# Patient Record
Sex: Female | Born: 1955
Health system: Southern US, Community
[De-identification: ages and names within clinical notes are randomized; demographics above are authoritative.]

## PROBLEM LIST (undated history)

## (undated) DIAGNOSIS — J189 Pneumonia, unspecified organism: Secondary | ICD-10-CM

## (undated) DIAGNOSIS — J449 Chronic obstructive pulmonary disease, unspecified: Secondary | ICD-10-CM

## (undated) DIAGNOSIS — K8020 Calculus of gallbladder without cholecystitis without obstruction: Secondary | ICD-10-CM

## (undated) DIAGNOSIS — K635 Polyp of colon: Secondary | ICD-10-CM

## (undated) DIAGNOSIS — F419 Anxiety disorder, unspecified: Secondary | ICD-10-CM

## (undated) DIAGNOSIS — I7090 Unspecified atherosclerosis: Secondary | ICD-10-CM

## (undated) DIAGNOSIS — N321 Vesicointestinal fistula: Secondary | ICD-10-CM

## (undated) DIAGNOSIS — K5790 Diverticulosis of intestine, part unspecified, without perforation or abscess without bleeding: Secondary | ICD-10-CM

## (undated) DIAGNOSIS — Z8489 Family history of other specified conditions: Secondary | ICD-10-CM

## (undated) HISTORY — DX: Polyp of colon: K63.5

## (undated) HISTORY — DX: Unspecified atherosclerosis: I70.90

## (undated) HISTORY — DX: Diverticulosis of intestine, part unspecified, without perforation or abscess without bleeding: K57.90

## (undated) HISTORY — DX: Calculus of gallbladder without cholecystitis without obstruction: K80.20

## (undated) HISTORY — PX: CHOLECYSTECTOMY: SHX55

## (undated) HISTORY — DX: Anxiety disorder, unspecified: F41.9

## (undated) HISTORY — DX: Chronic obstructive pulmonary disease, unspecified: J44.9

## (undated) HISTORY — PX: COLONOSCOPY: SHX174

## (undated) HISTORY — PX: ABDOMINAL HYSTERECTOMY: SHX81

---

## 1998-03-02 ENCOUNTER — Other Ambulatory Visit: Admission: RE | Admit: 1998-03-02 | Discharge: 1998-03-02 | Payer: Self-pay | Admitting: Dermatology

## 1999-01-28 ENCOUNTER — Other Ambulatory Visit: Admission: RE | Admit: 1999-01-28 | Discharge: 1999-01-28 | Payer: Self-pay | Admitting: Obstetrics and Gynecology

## 1999-02-04 ENCOUNTER — Ambulatory Visit (HOSPITAL_COMMUNITY): Admission: RE | Admit: 1999-02-04 | Discharge: 1999-02-04 | Payer: Self-pay | Admitting: Obstetrics and Gynecology

## 1999-02-04 ENCOUNTER — Encounter: Payer: Self-pay | Admitting: Obstetrics and Gynecology

## 2000-07-31 ENCOUNTER — Emergency Department (HOSPITAL_COMMUNITY): Admission: EM | Admit: 2000-07-31 | Discharge: 2000-07-31 | Payer: Self-pay | Admitting: Emergency Medicine

## 2000-07-31 ENCOUNTER — Encounter: Payer: Self-pay | Admitting: Emergency Medicine

## 2002-11-12 ENCOUNTER — Emergency Department (HOSPITAL_COMMUNITY): Admission: EM | Admit: 2002-11-12 | Discharge: 2002-11-12 | Payer: Self-pay | Admitting: *Deleted

## 2002-11-12 ENCOUNTER — Encounter: Payer: Self-pay | Admitting: *Deleted

## 2006-09-27 ENCOUNTER — Emergency Department (HOSPITAL_COMMUNITY): Admission: EM | Admit: 2006-09-27 | Discharge: 2006-09-27 | Payer: Self-pay | Admitting: Emergency Medicine

## 2007-04-19 ENCOUNTER — Emergency Department (HOSPITAL_COMMUNITY): Admission: EM | Admit: 2007-04-19 | Discharge: 2007-04-19 | Payer: Self-pay | Admitting: Emergency Medicine

## 2008-06-23 ENCOUNTER — Emergency Department (HOSPITAL_COMMUNITY): Admission: EM | Admit: 2008-06-23 | Discharge: 2008-06-23 | Payer: Self-pay | Admitting: Emergency Medicine

## 2011-10-23 ENCOUNTER — Emergency Department (HOSPITAL_COMMUNITY): Payer: Managed Care, Other (non HMO)

## 2011-10-23 ENCOUNTER — Emergency Department (HOSPITAL_COMMUNITY)
Admission: EM | Admit: 2011-10-23 | Discharge: 2011-10-23 | Disposition: A | Discharge status: Home / Self Care | Payer: Managed Care, Other (non HMO) | Attending: Emergency Medicine | Admitting: Emergency Medicine

## 2011-10-23 ENCOUNTER — Other Ambulatory Visit: Payer: Self-pay

## 2011-10-23 DIAGNOSIS — E876 Hypokalemia: Secondary | ICD-10-CM | POA: Insufficient documentation

## 2011-10-23 DIAGNOSIS — R110 Nausea: Secondary | ICD-10-CM | POA: Insufficient documentation

## 2011-10-23 DIAGNOSIS — R05 Cough: Secondary | ICD-10-CM | POA: Insufficient documentation

## 2011-10-23 DIAGNOSIS — J3489 Other specified disorders of nose and nasal sinuses: Secondary | ICD-10-CM | POA: Insufficient documentation

## 2011-10-23 DIAGNOSIS — R079 Chest pain, unspecified: Secondary | ICD-10-CM | POA: Insufficient documentation

## 2011-10-23 DIAGNOSIS — R209 Unspecified disturbances of skin sensation: Secondary | ICD-10-CM | POA: Insufficient documentation

## 2011-10-23 DIAGNOSIS — IMO0001 Reserved for inherently not codable concepts without codable children: Secondary | ICD-10-CM | POA: Insufficient documentation

## 2011-10-23 DIAGNOSIS — J40 Bronchitis, not specified as acute or chronic: Secondary | ICD-10-CM | POA: Insufficient documentation

## 2011-10-23 DIAGNOSIS — M79609 Pain in unspecified limb: Secondary | ICD-10-CM | POA: Insufficient documentation

## 2011-10-23 DIAGNOSIS — R059 Cough, unspecified: Secondary | ICD-10-CM | POA: Insufficient documentation

## 2011-10-23 LAB — URINALYSIS, ROUTINE W REFLEX MICROSCOPIC
Bilirubin Urine: NEGATIVE
Glucose, UA: NEGATIVE mg/dL
Nitrite: NEGATIVE
Specific Gravity, Urine: 1.008 (ref 1.005–1.030)
pH: 6 (ref 5.0–8.0)

## 2011-10-23 LAB — CBC
HCT: 41 % (ref 36.0–46.0)
Hemoglobin: 14.4 g/dL (ref 12.0–15.0)
MCH: 30.8 pg (ref 26.0–34.0)
MCV: 87.6 fL (ref 78.0–100.0)
RBC: 4.68 MIL/uL (ref 3.87–5.11)
WBC: 7 10*3/uL (ref 4.0–10.5)

## 2011-10-23 LAB — POCT I-STAT TROPONIN I: Troponin i, poc: 0 ng/mL (ref 0.00–0.08)

## 2011-10-23 LAB — BASIC METABOLIC PANEL
CO2: 28 mEq/L (ref 19–32)
Chloride: 97 mEq/L (ref 96–112)
Glucose, Bld: 109 mg/dL — ABNORMAL HIGH (ref 70–99)
Sodium: 136 mEq/L (ref 135–145)

## 2011-10-23 LAB — CARDIAC PANEL(CRET KIN+CKTOT+MB+TROPI)
Relative Index: INVALID (ref 0.0–2.5)
Total CK: 80 U/L (ref 7–177)

## 2011-10-23 MED ORDER — POTASSIUM CHLORIDE CRYS ER 20 MEQ PO TBCR
40.0000 meq | EXTENDED_RELEASE_TABLET | Freq: Once | ORAL | Status: AC
  Administered 2011-10-23: 40 meq via ORAL
  Filled 2011-10-23: qty 2

## 2011-10-23 MED ORDER — ALBUTEROL SULFATE HFA 108 (90 BASE) MCG/ACT IN AERS: 1.0000 | INHALATION_SPRAY | Freq: Four times a day (QID) | RESPIRATORY_TRACT | Status: DC | PRN

## 2011-10-23 MED ORDER — ASPIRIN 81 MG PO CHEW
324.0000 mg | CHEWABLE_TABLET | Freq: Once | ORAL | Status: AC
  Administered 2011-10-23: 324 mg via ORAL
  Filled 2011-10-23: qty 4

## 2011-10-23 MED ORDER — KETOROLAC TROMETHAMINE 30 MG/ML IJ SOLN
30.0000 mg | Freq: Once | INTRAMUSCULAR | Status: AC
  Administered 2011-10-23: 30 mg via INTRAVENOUS
  Filled 2011-10-23: qty 1

## 2011-10-23 NOTE — ED Notes (Signed)
Pt alert and oriented x4. Respirations even and unlabored, bilateral symmetrical rise and fall of chest. Skin warm and dry. In no acute distress. Denies needs.   

## 2011-10-23 NOTE — ED Notes (Signed)
Pt in from home with chest discomfort x1 week states pain is left side radiating to the left axilla pt states pain is accompanied with nausea and sob pt is a current smoker states sob worsens when active

## 2011-10-23 NOTE — ED Provider Notes (Addendum)
History     CSN: [id number]  Arrival date & time 10/23/11  1034   First MD Initiated Contact with Patient 10/23/11 1055      Chief Complaint  Patient presents with  . Chest Pain   Pleasant patient presents from home. She states she began having "flu symptoms" earlier this week. This included cough, congestion, and some body aches. She was seen at the urgent care on Thursday. States she had a chest x-ray and influenza test, and a urinalysis done. She was told she did not have the flu, but was placed on doxycycline. Apparently for bronchitis and UTI.Marland Kitchen Also, having some intermittent nausea. Patient apparently is a smoker.  She did have a fever at that time, but no fever today. She continues to have left-sided chest tightness and vague discomfort, and states today, had painand tingling in both hands. She also notes some discomfort with exertion  and with palpation of the chest. The patient is notably upset, tearful. She states her brother recently died of a heart attack that was due to stroke, and staph infection. She has had no previous stress testing done. Denies any leg pain or back pain.  She denies any pleuritic pain. Denies any recent surgeries. No significant past medical history. No diabetes or hypertension.  Denies dizziness or syncope.  No hemoptysis.  (Consider location/radiation/quality/duration/timing/severity/associated sxs/prior treatment) HPI  History reviewed. No pertinent past medical history.  History reviewed. No pertinent past surgical history.  History reviewed. No pertinent family history.  History  Substance Use Topics  . Smoking status: Current Everyday Smoker  . Smokeless tobacco: Not on file  . Alcohol Use: No    OB History    Grav Para Term Preterm Abortions TAB SAB Ect Mult Living                  Review of Systems  Constitutional: Negative for chills and fatigue.  HENT: Negative for neck stiffness and tinnitus.   Eyes: Negative for pain.  Respiratory:  Positive for cough. Negative for apnea, choking, wheezing and stridor.   Cardiovascular: Negative for palpitations and leg swelling.  Genitourinary: Negative for flank pain.  Musculoskeletal: Negative for back pain.  Neurological: Negative for dizziness and syncope.  Psychiatric/Behavioral: Negative for confusion and agitation.  All other systems reviewed and are negative.    Allergies  Ampicillin; Codeine; Penicillins; and Zithromax  Home Medications   Current Outpatient Rx  Name Route Sig Dispense Refill  . BENZONATATE 100 MG PO CAPS Oral Take 100 mg by mouth 3 (three) times daily as needed. For cough.     . DOXYCYCLINE HYCLATE 100 MG PO CAPS Oral Take 100 mg by mouth 2 (two) times daily. 10 day course of therapy; not completed.       BP 130/82  Pulse 84  Temp(Src) 98.4 F (36.9 C) (Oral)  Resp 18  SpO2 93%  Physical Exam  Nursing note and vitals reviewed. Constitutional: She is oriented to person, place, and time. She appears well-developed and well-nourished.       Calm, cooperative, comfortable.   HENT:  Head: Normocephalic and atraumatic.  Eyes: Conjunctivae and EOM are normal. Pupils are equal, round, and reactive to light.  Neck: Neck supple.  Cardiovascular: Normal rate and regular rhythm.  Exam reveals no gallop and no friction rub.   No murmur heard. Pulmonary/Chest: Breath sounds normal. She has no wheezes. She has no rales. She exhibits no tenderness.       Very tender to palpation  to L upper chest wall.   Abdominal: Soft. Bowel sounds are normal. She exhibits no distension. There is no tenderness. There is no rebound and no guarding.  Musculoskeletal: Normal range of motion.  Neurological: She is alert and oriented to person, place, and time. No cranial nerve deficit. Coordination normal.  Skin: Skin is warm and dry. No rash noted.  Psychiatric: She has a normal mood and affect.    ED Course  Procedures (including critical care time)  Labs Reviewed    BASIC METABOLIC PANEL - Abnormal; Notable for the following:    Potassium 3.0 (*)    Glucose, Bld 109 (*)    All other components within normal limits  URINALYSIS, ROUTINE W REFLEX MICROSCOPIC - Abnormal; Notable for the following:    Hgb urine dipstick TRACE (*)    Ketones, ur TRACE (*)    All other components within normal limits  CARDIAC PANEL(CRET KIN+CKTOT+MB+TROPI)  CBC  URINE MICROSCOPIC-ADD ON  POCT I-STAT TROPONIN I  I-STAT TROPONIN I  URINE CULTURE  I-STAT TROPONIN I   Dg Chest 2 View  10/23/2011  *RADIOLOGY REPORT*  Clinical Data: Smoker with chest pain.  CHEST - 2 VIEW  Comparison: 06/23/2008.  Findings: Normal sized heart.  Clear lungs.  The lungs remain mildly hyperexpanded.  Stable mild central peribronchial thickening.  Mild thoracic spine degenerative changes.  IMPRESSION: Stable mild changes of COPD and chronic bronchitis.  No acute abnormality.  Original Report Authenticated By: Darrol Angel, M.D.     1. Chest pain   2. Bronchitis       MDM  Pt is seen and examined;  Initial history and physical completed.  Will follow.  nt     Date: 10/23/2011  Rate: 93  Rhythm: normal sinus rhythm  QRS Axis: normal  Intervals: normal  ST/T Wave abnormalities: nonspecific T wave changes  Conduction Disutrbances:none  Narrative Interpretation:   Old EKG Reviewed: changes noted    Results for orders placed during the hospital encounter of 10/23/11  CARDIAC PANEL(CRET KIN+CKTOT+MB+TROPI)      Component Value Range   Total CK 80  7 - 177 (U/L)   CK, MB 2.0  0.3 - 4.0 (ng/mL)   Troponin I <0.30  <0.30 (ng/mL)   Relative Index RELATIVE INDEX IS INVALID  0.0 - 2.5   BASIC METABOLIC PANEL      Component Value Range   Sodium 136  135 - 145 (mEq/L)   Potassium 3.0 (*) 3.5 - 5.1 (mEq/L)   Chloride 97  96 - 112 (mEq/L)   CO2 28  19 - 32 (mEq/L)   Glucose, Bld 109 (*) 70 - 99 (mg/dL)   BUN 13  6 - 23 (mg/dL)   Creatinine, Ser 1.61  0.50 - 1.10 (mg/dL)   Calcium  9.3  8.4 - 10.5 (mg/dL)   GFR calc non Af Amer >90  >90 (mL/min)   GFR calc Af Amer >90  >90 (mL/min)  CBC      Component Value Range   WBC 7.0  4.0 - 10.5 (K/uL)   RBC 4.68  3.87 - 5.11 (MIL/uL)   Hemoglobin 14.4  12.0 - 15.0 (g/dL)   HCT 09.6  04.5 - 40.9 (%)   MCV 87.6  78.0 - 100.0 (fL)   MCH 30.8  26.0 - 34.0 (pg)   MCHC 35.1  30.0 - 36.0 (g/dL)   RDW 81.1  91.4 - 78.2 (%)   Platelets 209  150 - 400 (K/uL)  URINALYSIS,  ROUTINE W REFLEX MICROSCOPIC      Component Value Range   Color, Urine YELLOW  YELLOW    APPearance CLEAR  CLEAR    Specific Gravity, Urine 1.008  1.005 - 1.030    pH 6.0  5.0 - 8.0    Glucose, UA NEGATIVE  NEGATIVE (mg/dL)   Hgb urine dipstick TRACE (*) NEGATIVE    Bilirubin Urine NEGATIVE  NEGATIVE    Ketones, ur TRACE (*) NEGATIVE (mg/dL)   Protein, ur NEGATIVE  NEGATIVE (mg/dL)   Urobilinogen, UA 0.2  0.0 - 1.0 (mg/dL)   Nitrite NEGATIVE  NEGATIVE    Leukocytes, UA NEGATIVE  NEGATIVE   URINE MICROSCOPIC-ADD ON      Component Value Range   Squamous Epithelial / LPF RARE  RARE    RBC / HPF 0-2  <3 (RBC/hpf)   Bacteria, UA RARE  RARE   POCT I-STAT TROPONIN I      Component Value Range   Troponin i, poc 0.00  0.00 - 0.08 (ng/mL)   Comment 3            Dg Chest 2 View  10/23/2011  *RADIOLOGY REPORT*  Clinical Data: Smoker with chest pain.  CHEST - 2 VIEW  Comparison: 06/23/2008.  Findings: Normal sized heart.  Clear lungs.  The lungs remain mildly hyperexpanded.  Stable mild central peribronchial thickening.  Mild thoracic spine degenerative changes.  IMPRESSION: Stable mild changes of COPD and chronic bronchitis.  No acute abnormality.  Original Report Authenticated By: Darrol Angel, M.D.      3:00 PM  Patient is reassessed in the room. Initial CK, CK-MB, and troponin, were all negative. White count is normal. Urine was negative for nitrites or leukocytes. Initial studies are reassuring other than mild low potassium, which patient was given.  At  this time. The pain sounds very atypical, is reproducible and has been there for several days. Patient is calm, comfortable, and in no acute distress.  NSR on monitor, HR in 80's  Will repeat troponin, give Toradol for her chest wall pain and reassessed, likely will be stable for outpatient followup   3:01 PM Patient reassessed. She continues to be pain-free and remains well.  Second troponin is 0.00. Very low suspicion for cardiac etiology. Again, reproducible, associated with cough. Patient was given option for inpatient observation or outpatient followup and she would like to followup as an outpatient. Will have her follow up with her primary care physician and cardiologist as needed. Patient was told to return to ED for any concerns. Continue her doxycycline and was also given a prescription for albuterol inhaler  Axiel Fjeld A. Patrica Duel, MD 10/23/11 1520   Dc home in stable and improved condition. 3:22 PM   TRUE Shackleford A. Patrica Duel, MD 10/23/11 1610

## 2011-10-24 LAB — URINE CULTURE
Colony Count: NO GROWTH
Culture  Setup Time: 201301061745
Culture: NO GROWTH

## 2011-11-15 ENCOUNTER — Ambulatory Visit (INDEPENDENT_AMBULATORY_CARE_PROVIDER_SITE_OTHER): Payer: Managed Care, Other (non HMO) | Admitting: Internal Medicine

## 2011-11-15 ENCOUNTER — Other Ambulatory Visit (INDEPENDENT_AMBULATORY_CARE_PROVIDER_SITE_OTHER): Payer: Managed Care, Other (non HMO)

## 2011-11-15 ENCOUNTER — Encounter: Payer: Self-pay | Admitting: Internal Medicine

## 2011-11-15 DIAGNOSIS — J439 Emphysema, unspecified: Secondary | ICD-10-CM | POA: Insufficient documentation

## 2011-11-15 DIAGNOSIS — E876 Hypokalemia: Secondary | ICD-10-CM

## 2011-11-15 DIAGNOSIS — Z720 Tobacco use: Secondary | ICD-10-CM | POA: Insufficient documentation

## 2011-11-15 DIAGNOSIS — J449 Chronic obstructive pulmonary disease, unspecified: Secondary | ICD-10-CM

## 2011-11-15 DIAGNOSIS — J4489 Other specified chronic obstructive pulmonary disease: Secondary | ICD-10-CM | POA: Insufficient documentation

## 2011-11-15 DIAGNOSIS — F17200 Nicotine dependence, unspecified, uncomplicated: Secondary | ICD-10-CM

## 2011-11-15 DIAGNOSIS — R002 Palpitations: Secondary | ICD-10-CM

## 2011-11-15 DIAGNOSIS — R7309 Other abnormal glucose: Secondary | ICD-10-CM

## 2011-11-15 DIAGNOSIS — F411 Generalized anxiety disorder: Secondary | ICD-10-CM

## 2011-11-15 DIAGNOSIS — J441 Chronic obstructive pulmonary disease with (acute) exacerbation: Secondary | ICD-10-CM | POA: Insufficient documentation

## 2011-11-15 DIAGNOSIS — R079 Chest pain, unspecified: Secondary | ICD-10-CM

## 2011-11-15 DIAGNOSIS — R739 Hyperglycemia, unspecified: Secondary | ICD-10-CM | POA: Insufficient documentation

## 2011-11-15 DIAGNOSIS — F419 Anxiety disorder, unspecified: Secondary | ICD-10-CM

## 2011-11-15 LAB — COMPREHENSIVE METABOLIC PANEL
ALT: 24 U/L (ref 0–35)
Alkaline Phosphatase: 68 U/L (ref 39–117)
Sodium: 140 mEq/L (ref 135–145)
Total Bilirubin: 0.5 mg/dL (ref 0.3–1.2)
Total Protein: 6.8 g/dL (ref 6.0–8.3)

## 2011-11-15 LAB — CBC WITH DIFFERENTIAL/PLATELET
Eosinophils Absolute: 0.1 10*3/uL (ref 0.0–0.7)
Eosinophils Relative: 1.6 % (ref 0.0–5.0)
Lymphocytes Relative: 43.9 % (ref 12.0–46.0)
MCV: 93.3 fl (ref 78.0–100.0)
Monocytes Absolute: 0.4 10*3/uL (ref 0.1–1.0)
Neutrophils Relative %: 48.5 % (ref 43.0–77.0)
Platelets: 296 10*3/uL (ref 150.0–400.0)
WBC: 8.3 10*3/uL (ref 4.5–10.5)

## 2011-11-15 LAB — URINALYSIS, ROUTINE W REFLEX MICROSCOPIC
Hgb urine dipstick: NEGATIVE
Nitrite: NEGATIVE
Total Protein, Urine: NEGATIVE
pH: 6 (ref 5.0–8.0)

## 2011-11-15 LAB — LIPID PANEL
HDL: 61.2 mg/dL (ref >39.00)
Triglycerides: 105 mg/dL (ref 0.0–149.0)
VLDL: 21 mg/dL (ref 0.0–40.0)

## 2011-11-15 LAB — HEMOGLOBIN A1C: Hgb A1c MFr Bld: 5.9 % (ref 4.6–6.5)

## 2011-11-15 MED ORDER — ALPRAZOLAM 0.25 MG PO TABS: 0.2500 mg | ORAL_TABLET | Freq: Three times a day (TID) | ORAL | Status: DC | PRN

## 2011-11-15 MED ORDER — ACLIDINIUM BROMIDE 400 MCG/ACT IN AEPB: 1.0000 | INHALATION_SPRAY | Freq: Two times a day (BID) | RESPIRATORY_TRACT | Status: DC

## 2011-11-15 NOTE — Assessment & Plan Note (Signed)
The palpitations sound benign, she will see cardiology for further evaluation

## 2011-11-15 NOTE — Patient Instructions (Signed)
Chest Pain (Nonspecific) It is often hard to give a specific diagnosis for the cause of chest pain. There is always a chance that your pain could be related to something serious, such as a heart attack or a blood clot in the lungs. You need to follow up with your caregiver for further evaluation. CAUSES   Heartburn.   Pneumonia or bronchitis.   Anxiety and stress.   Inflammation around your heart (pericarditis) or lung (pleuritis or pleurisy).   A blood clot in the lung.   A collapsed lung (pneumothorax). It can develop suddenly on its own (spontaneous pneumothorax) or from injury (trauma) to the chest.  The chest wall is composed of bones, muscles, and cartilage. Any of these can be the source of the pain.  The bones can be bruised by injury.   The muscles or cartilage can be strained by coughing or overwork.   The cartilage can be affected by inflammation and become sore (costochondritis).  DIAGNOSIS  Lab tests or other studies, such as X-rays, an EKG, stress testing, or cardiac imaging, may be needed to find the cause of your pain.  TREATMENT   Treatment depends on what may be causing your chest pain. Treatment may include:   Acid blockers for heartburn.   Anti-inflammatory medicine.   Pain medicine for inflammatory conditions.   Antibiotics if an infection is present.   You may be advised to change lifestyle habits. This includes stopping smoking and avoiding caffeine and chocolate.   You may be advised to keep your head raised (elevated) when sleeping. This reduces the chance of acid going backward from your stomach into your esophagus.   Most of the time, nonspecific chest pain will improve within 2 to 3 days with rest and mild pain medicine.  HOME CARE INSTRUCTIONS   If antibiotics were prescribed, take the full amount even if you start to feel better.   For the next few days, avoid physical activities that bring on chest pain. Continue physical activities as  directed.   Do not smoke cigarettes or drink alcohol until your symptoms are gone.   Only take over-the-counter or prescription medicine for pain, discomfort, or fever as directed by your caregiver.   Follow your caregiver's suggestions for further testing if your chest pain does not go away.   Keep any follow-up appointments you made. If you do not go to an appointment, you could develop lasting (chronic) problems with pain. If there is any problem keeping an appointment, you must call to reschedule.  SEEK MEDICAL CARE IF:   You think you are having problems from the medicine you are taking. Read your medicine instructions carefully.   Your chest pain does not go away, even after treatment.   You develop a rash with blisters on your chest.  SEEK IMMEDIATE MEDICAL CARE IF:   You have increased chest pain or pain that spreads to your arm, neck, jaw, back, or belly (abdomen).   You develop shortness of breath, an increasing cough, or you are coughing up blood.   You have severe back or abdominal pain, feel sick to your stomach (nauseous) or throw up (vomit).   You develop severe weakness, fainting, or chills.   You have an oral temperature above 102 F (38.9 C), not controlled by medicine.  THIS IS AN EMERGENCY. Do not wait to see if the pain will go away. Get medical help at once. Call your local emergency services (911 in [place] Do not drive yourself to   the hospital. MAKE SURE YOU:   Understand these instructions.   Will watch your condition.   Will get help right away if you are not doing well or get worse.  Document Released: 07/13/2005 Document Revised: 06/15/2011 Document Reviewed: 05/08/2008 [facility] Patient Information 2012 [place], [place].Chronic Obstructive Pulmonary Disease Chronic obstructive pulmonary disease (COPD) is a condition in which airflow from the lungs is restricted. The lungs can never return to normal, but there are measures you can take which will improve  them and make you feel better. CAUSES   Smoking.   Exposure to secondhand smoke.   Breathing in irritants (pollution, cigarette smoke, strong smells, aerosol sprays, paint fumes).   History of lung infections.  TREATMENT  Treatment focuses on making you comfortable (supportive care). Your caregiver may prescribe medications (inhaled or pills) to help improve your breathing. HOME CARE INSTRUCTIONS   If you smoke, stop smoking.   Avoid exposure to smoke, chemicals, and fumes that aggravate your breathing.   Take antibiotic medicines as directed by your caregiver.   Avoid medicines that dry up your system and slow down the elimination of secretions (antihistamines and cough syrups). This decreases respiratory capacity and may lead to infections.   Drink enough water and fluids to keep your urine clear or pale yellow. This loosens secretions.   Use humidifiers at home and at your bedside if they do not make breathing difficult.   Receive all protective vaccines your caregiver suggests, especially pneumococcal and influenza.   Use home oxygen as suggested.   Stay active. Exercise and physical activity will help maintain your ability to do things you want to do.   Eat a healthy diet.  SEEK MEDICAL CARE IF:   You develop pus-like mucus (sputum).   Breathing is more labored or exercise becomes difficult to do.   You are running out of the medicine you take for your breathing.  SEEK IMMEDIATE MEDICAL CARE IF:   You have a rapid heart rate.   You have agitation, confusion, tremors, or are in a stupor (family members may need to observe this).   It becomes difficult to breathe.   You develop chest pain.   You have a fever.  MAKE SURE YOU:   Understand these instructions.   Will watch your condition.   Will get help right away if you are not doing well or get worse.  Document Released: 07/13/2005 Document Revised: 06/15/2011 Document Reviewed: 12/03/2010 [facility]  Patient Information 2012 [place], [place].

## 2011-11-15 NOTE — Assessment & Plan Note (Signed)
Her pain is atypical and her EKG is not diagnostic but she does has risk factors so I have referred her to cardiology

## 2011-11-15 NOTE — Assessment & Plan Note (Signed)
Start tudorza and she agrees to quit smoking

## 2011-11-15 NOTE — Progress Notes (Signed)
Subjective:    Patient ID: Lynn Kline, female    DOB: September 09, 1956, 56 y.o.   MRN: [id number]  Cough This is a chronic problem. The current episode started more than 1 month ago. The problem has been unchanged. The problem occurs every few hours. The cough is non-productive. Associated symptoms include chest pain and shortness of breath. Pertinent negatives include no chills, ear congestion, ear pain, fever, headaches, heartburn, hemoptysis, myalgias, nasal congestion, postnasal drip, rash, rhinorrhea, sore throat, sweats, weight loss or wheezing. The symptoms are aggravated by nothing. Risk factors for lung disease include smoking/tobacco exposure. She has tried a beta-agonist inhaler for the symptoms. The treatment provided no relief. Her past medical history is significant for COPD and emphysema.  Chest Pain  This is a recurrent problem. The current episode started more than 1 month ago. The onset quality is gradual. The problem occurs intermittently. The problem has been unchanged. The pain is present in the lateral region (left upper chest wall). The pain is at a severity of 2/10. The pain is mild. The quality of the pain is described as dull. The pain radiates to the left neck and left arm. Associated symptoms include a cough, palpitations and shortness of breath. Pertinent negatives include no abdominal pain, back pain, claudication, diaphoresis, dizziness, exertional chest pressure, fever, headaches, hemoptysis, irregular heartbeat, leg pain, lower extremity edema, malaise/fatigue, nausea, near-syncope, numbness, orthopnea, PND, sputum production, syncope, vomiting or weakness.      Review of Systems  Constitutional: Negative for fever, chills, weight loss, malaise/fatigue, diaphoresis, activity change, appetite change, fatigue and unexpected weight change.  HENT: Negative.  Negative for ear pain, sore throat, rhinorrhea and postnasal drip.   Eyes: Negative.   Respiratory: Positive for  cough and shortness of breath. Negative for apnea, hemoptysis, sputum production, choking, chest tightness, wheezing and stridor.   Cardiovascular: Positive for chest pain and palpitations. Negative for orthopnea, claudication, leg swelling, syncope, PND and near-syncope.  Gastrointestinal: Negative for heartburn, nausea, vomiting, abdominal pain, diarrhea, constipation, abdominal distention and anal bleeding.  Genitourinary: Negative for dysuria, urgency, frequency, hematuria, flank pain, decreased urine volume, enuresis, difficulty urinating and dyspareunia.  Musculoskeletal: Negative for myalgias, back pain, joint swelling, arthralgias and gait problem.  Skin: Negative for color change, pallor, rash and wound.  Neurological: Negative for dizziness, weakness, numbness and headaches.  Hematological: Negative for adenopathy. Does not bruise/bleed easily.  Psychiatric/Behavioral: Negative for suicidal ideas, hallucinations, behavioral problems, confusion, sleep disturbance, self-injury, dysphoric mood, decreased concentration and agitation. The patient is nervous/anxious. The patient is not hyperactive.        Objective:   Physical Exam  Vitals reviewed. Constitutional: She is oriented to person, place, and time. She appears well-developed and well-nourished. No distress.  HENT:  Head: Normocephalic and atraumatic.  Mouth/Throat: No oropharyngeal exudate.  Eyes: Conjunctivae are normal. Right eye exhibits no discharge. Left eye exhibits no discharge. No scleral icterus.  Neck: Normal range of motion. Neck supple. No JVD present. No tracheal deviation present. No thyromegaly present.  Cardiovascular: Normal rate, regular rhythm, normal heart sounds and intact distal pulses.  Exam reveals no gallop and no friction rub.   No murmur heard. Pulmonary/Chest: Effort normal and breath sounds normal. No stridor. No respiratory distress. She has no wheezes. She has no rales. She exhibits no tenderness.    Abdominal: Soft. Bowel sounds are normal. She exhibits no distension and no mass. There is no tenderness. There is no rebound and no guarding.  Musculoskeletal: Normal range of motion.  She exhibits no edema and no tenderness.  Lymphadenopathy:    She has no cervical adenopathy.  Neurological: She is oriented to person, place, and time.  Skin: Skin is warm and dry. No rash noted. She is not diaphoretic. No erythema. No pallor.  Psychiatric: She has a normal mood and affect. Her behavior is normal. Judgment and thought content normal.      Lab Results  Component Value Date   WBC 7.0 10/23/2011   HGB 14.4 10/23/2011   HCT 41.0 10/23/2011   PLT 209 10/23/2011   GLUCOSE 109* 10/23/2011   NA 136 10/23/2011   K 3.0* 10/23/2011   CL 97 10/23/2011   CREATININE 0.69 10/23/2011   BUN 13 10/23/2011   CO2 28 10/23/2011  Dg Chest 2 View  10/23/2011  *RADIOLOGY REPORT*  Clinical Data: Smoker with chest pain.  CHEST - 2 VIEW  Comparison: 06/23/2008.  Findings: Normal sized heart.  Clear lungs.  The lungs remain mildly hyperexpanded.  Stable mild central peribronchial thickening.  Mild thoracic spine degenerative changes.  IMPRESSION: Stable mild changes of COPD and chronic bronchitis.  No acute abnormality.  Original Report Authenticated By: Darrol Angel, M.D.     Assessment & Plan:

## 2011-11-15 NOTE — Assessment & Plan Note (Signed)
I will check her a1c today to see if she has developed DM II 

## 2011-11-15 NOTE — Assessment & Plan Note (Signed)
I will recheck her K+ level today 

## 2011-11-15 NOTE — Assessment & Plan Note (Signed)
We discussed treatment options and she will try nicotine patches

## 2011-11-15 NOTE — Assessment & Plan Note (Signed)
She has taken xanax before so will try it again

## 2011-12-14 ENCOUNTER — Encounter: Payer: Self-pay | Admitting: Internal Medicine

## 2011-12-14 ENCOUNTER — Ambulatory Visit (INDEPENDENT_AMBULATORY_CARE_PROVIDER_SITE_OTHER): Payer: Managed Care, Other (non HMO) | Admitting: Internal Medicine

## 2011-12-14 DIAGNOSIS — F17200 Nicotine dependence, unspecified, uncomplicated: Secondary | ICD-10-CM

## 2011-12-14 DIAGNOSIS — Z0000 Encounter for general adult medical examination without abnormal findings: Secondary | ICD-10-CM | POA: Insufficient documentation

## 2011-12-14 DIAGNOSIS — Z720 Tobacco use: Secondary | ICD-10-CM

## 2011-12-14 DIAGNOSIS — J449 Chronic obstructive pulmonary disease, unspecified: Secondary | ICD-10-CM

## 2011-12-14 DIAGNOSIS — Z1231 Encounter for screening mammogram for malignant neoplasm of breast: Secondary | ICD-10-CM | POA: Insufficient documentation

## 2011-12-14 DIAGNOSIS — Z0001 Encounter for general adult medical examination with abnormal findings: Secondary | ICD-10-CM | POA: Insufficient documentation

## 2011-12-14 NOTE — Progress Notes (Signed)
  Subjective:    Patient ID: Lynn Kline, female    DOB: 1956/09/18, 56 y.o.   MRN: [id number]  HPI She returns for f/up and tells me that she feels much better with no cough, SOB, wheezing. She has set a quit date to stop smoking and she has an on-line "quit coach."   Review of Systems  Constitutional: Negative for fever, chills, diaphoresis, activity change, appetite change, fatigue and unexpected weight change.  HENT: Negative.   Eyes: Negative.   Respiratory: Negative for cough, chest tightness, shortness of breath, wheezing and stridor.   Cardiovascular: Negative for chest pain, palpitations and leg swelling.  Gastrointestinal: Negative.   Genitourinary: Negative.   Musculoskeletal: Negative.   Skin: Negative.   Neurological: Negative.   Hematological: Negative for adenopathy. Does not bruise/bleed easily.  Psychiatric/Behavioral: Negative.        Objective:   Physical Exam  Vitals reviewed. Constitutional: She is oriented to person, place, and time. She appears well-developed and well-nourished. No distress.  HENT:  Head: Normocephalic and atraumatic.  Mouth/Throat: Oropharynx is clear and moist. No oropharyngeal exudate.  Eyes: Conjunctivae are normal. Right eye exhibits no discharge. Left eye exhibits no discharge. No scleral icterus.  Neck: Normal range of motion. Neck supple. No JVD present. No tracheal deviation present. No thyromegaly present.  Cardiovascular: Normal rate, regular rhythm, normal heart sounds and intact distal pulses.  Exam reveals no gallop and no friction rub.   No murmur heard. Pulmonary/Chest: Effort normal and breath sounds normal. No stridor. No respiratory distress. She has no wheezes. She has no rales. She exhibits no tenderness.  Abdominal: Soft. Bowel sounds are normal. She exhibits no distension and no mass. There is no tenderness. There is no rebound and no guarding.  Musculoskeletal: She exhibits no edema and no tenderness.    Lymphadenopathy:    She has no cervical adenopathy.  Neurological: She is oriented to person, place, and time.  Skin: Skin is warm and dry. No rash noted. She is not diaphoretic. No erythema. No pallor.  Psychiatric: She has a normal mood and affect. Her behavior is normal. Judgment and thought content normal.     Lab Results  Component Value Date   WBC 8.3 11/15/2011   HGB 12.8 11/15/2011   HCT 38.2 11/15/2011   PLT 296.0 11/15/2011   GLUCOSE 97 11/15/2011   CHOL 220* 11/15/2011   TRIG 105.0 11/15/2011   HDL 61.20 11/15/2011   LDLDIRECT 141.0 11/15/2011   ALT 24 11/15/2011   AST 19 11/15/2011   NA 140 11/15/2011   K 4.0 11/15/2011   CL 105 11/15/2011   CREATININE 0.6 11/15/2011   BUN 13 11/15/2011   CO2 29 11/15/2011   TSH 0.80 11/15/2011   HGBA1C 5.9 11/15/2011       Assessment & Plan:

## 2011-12-14 NOTE — Patient Instructions (Signed)

## 2011-12-15 NOTE — Assessment & Plan Note (Signed)
She was praised for her attempts to quit smoking

## 2011-12-15 NOTE — Assessment & Plan Note (Signed)
She will continue with [place]

## 2012-01-18 ENCOUNTER — Ambulatory Visit (HOSPITAL_COMMUNITY)
Admission: RE | Admit: 2012-01-18 | Discharge: 2012-01-18 | Disposition: A | Discharge status: Home / Self Care | Payer: Managed Care, Other (non HMO) | Source: Ambulatory Visit | Attending: Internal Medicine | Admitting: Internal Medicine

## 2012-01-18 DIAGNOSIS — Z1231 Encounter for screening mammogram for malignant neoplasm of breast: Secondary | ICD-10-CM

## 2012-01-27 ENCOUNTER — Encounter: Payer: Self-pay | Admitting: Internal Medicine

## 2012-02-28 ENCOUNTER — Other Ambulatory Visit: Payer: Self-pay

## 2012-02-28 DIAGNOSIS — F419 Anxiety disorder, unspecified: Secondary | ICD-10-CM

## 2012-02-28 MED ORDER — ALPRAZOLAM 0.25 MG PO TABS: 0.2500 mg | ORAL_TABLET | Freq: Three times a day (TID) | ORAL | Status: DC | PRN

## 2012-03-02 ENCOUNTER — Ambulatory Visit (AMBULATORY_SURGERY_CENTER): Payer: Managed Care, Other (non HMO)

## 2012-03-02 VITALS — Ht 64.0 in | Wt 138.0 lb

## 2012-03-02 DIAGNOSIS — Z1211 Encounter for screening for malignant neoplasm of colon: Secondary | ICD-10-CM

## 2012-03-02 MED ORDER — PEG-KCL-NACL-NASULF-NA ASC-C 100 G PO SOLR: 1.0000 | Freq: Once | ORAL | Status: DC

## 2012-03-06 ENCOUNTER — Telehealth: Payer: Self-pay | Admitting: Internal Medicine

## 2012-03-06 NOTE — Telephone Encounter (Signed)
Spoke with pt and she said she couldn't afford her prep.  She said it was $45 with her insurance.  I offered her a $20 rebate coupon and she said it was still too much.  She thought the colon was free under her wellness plan.  I explained that did not include the prep.  She agreed to have me order her the Peg solution with Dulcolax to see if she could afford that.  I called the Peg Prep and dulclax RX to her Pharmacy.  Lynn Kline

## 2012-03-16 ENCOUNTER — Ambulatory Visit (AMBULATORY_SURGERY_CENTER): Payer: Managed Care, Other (non HMO) | Admitting: Internal Medicine

## 2012-03-16 ENCOUNTER — Encounter: Payer: Self-pay | Admitting: Internal Medicine

## 2012-03-16 VITALS — BP 131/62 | HR 80 | Temp 96.6°F | Resp 18 | Ht 64.0 in | Wt 138.0 lb

## 2012-03-16 DIAGNOSIS — Z1211 Encounter for screening for malignant neoplasm of colon: Secondary | ICD-10-CM

## 2012-03-16 DIAGNOSIS — D126 Benign neoplasm of colon, unspecified: Secondary | ICD-10-CM

## 2012-03-16 MED ORDER — SODIUM CHLORIDE 0.9 % IV SOLN: 500.0000 mL | INTRAVENOUS | Status: DC

## 2012-03-16 NOTE — Progress Notes (Signed)
Patient did not experience any of the following events: a burn prior to discharge; a fall within the facility; wrong site/side/patient/procedure/implant event; or a hospital transfer or hospital admission upon discharge from the facility. (G8907) Patient did not have preoperative order for IV antibiotic SSI prophylaxis. (G8918) Patient did not have preoperative order for IV antibiotic SSI prophylaxis. (G8918)  

## 2012-03-16 NOTE — Patient Instructions (Signed)
Findings:  Polyps and diverticulosis  Recommendations:  High Fiber Diet                                   Repeat colonoscopy in 10 years depending on pathology  YOU HAD AN ENDOSCOPIC PROCEDURE TODAY AT THE [facility]: Refer to the procedure report that was given to you for any specific questions about what was found during the examination.  If the procedure report does not answer your questions, please call your gastroenterologist to clarify.  If you requested that your care partner not be given the details of your procedure findings, then the procedure report has been included in a sealed envelope for you to review at your convenience later.  YOU SHOULD EXPECT: Some feelings of bloating in the abdomen. Passage of more gas than usual.  Walking can help get rid of the air that was put into your GI tract during the procedure and reduce the bloating. If you had a lower endoscopy (such as a colonoscopy or flexible sigmoidoscopy) you may notice spotting of blood in your stool or on the toilet paper. If you underwent a bowel prep for your procedure, then you may not have a normal bowel movement for a few days.  DIET: Your first meal following the procedure should be a light meal and then it is ok to progress to your normal diet.  A half-sandwich or bowl of soup is an example of a good first meal.  Heavy or fried foods are harder to digest and may make you feel nauseous or bloated.  Likewise meals heavy in dairy and vegetables can cause extra gas to form and this can also increase the bloating.  Drink plenty of fluids but you should avoid alcoholic beverages for 24 hours.  ACTIVITY: Your care partner should take you home directly after the procedure.  You should plan to take it easy, moving slowly for the rest of the day.  You can resume normal activity the day after the procedure however you should NOT DRIVE or use heavy machinery for 24 hours (because of the sedation medicines used during the  test).    SYMPTOMS TO REPORT IMMEDIATELY: A gastroenterologist can be reached at any hour.  During normal business hours, 8:30 AM to 5:00 PM Monday through Friday, call [phone].  After hours and on weekends, please call the GI answering service at [phone] who will take a message and have the physician on call contact you.   Following lower endoscopy (colonoscopy or flexible sigmoidoscopy):  Excessive amounts of blood in the stool  Significant tenderness or worsening of abdominal pains  Swelling of the abdomen that is new, acute  Fever of 100F or higher  Following upper endoscopy (EGD)  Vomiting of blood or coffee ground material  New chest pain or pain under the shoulder blades  Painful or persistently difficult swallowing  New shortness of breath  Fever of 100F or higher  Black, tarry-looking stools  FOLLOW UP: If any biopsies were taken you will be contacted by phone or by letter within the next 1-3 weeks.  Call your gastroenterologist if you have not heard about the biopsies in 3 weeks.  Our staff will call the home number listed on your records the next business day following your procedure to check on you and address any questions or concerns that you may have at that time regarding the information given  to you following your procedure. This is a courtesy call and so if there is no answer at the home number and we have not heard from you through the emergency physician on call, we will assume that you have returned to your regular daily activities without incident.  SIGNATURES/CONFIDENTIALITY: You and/or your care partner have signed paperwork which will be entered into your electronic medical record.  These signatures attest to the fact that that the information above on your After Visit Summary has been reviewed and is understood.  Full responsibility of the confidentiality of this discharge information lies with you and/or your care-partner.   Please follow all  discharge instructions given to you by the recovery room nurse. If you have any questions or problems after discharge please call one of the numbers listed above. You will receive a phone call in the am to see how you are doing and answer any questions you may have. Thank you for choosing [facility] for your health care needs.

## 2012-03-16 NOTE — Op Note (Signed)
[facility] [address] [place], [place]  [address]  COLONOSCOPY PROCEDURE REPORT  PATIENT:  Lynn Kline, Lynn Kline  MR#:  [id number] BIRTHDATE:  [birth year], 55 yrs. old  GENDER:  female ENDOSCOPIST:  Lynn Morton. Juanda Chance, Lynn Kline REF. BY:  Etta Grandchild, M.D. PROCEDURE DATE:  Lynn PROCEDURE:  Colonoscopy with biopsy ASA CLASS:  Class I INDICATIONS:  colorectal cancer screening, average risk MEDICATIONS:   MAC sedation, administered by CRNA, propofol (Diprivan) 300 mg  DESCRIPTION OF PROCEDURE:   After the risks and benefits and of the procedure were explained, informed consent was obtained. Digital rectal exam was performed and revealed no rectal masses. The LB CF-[id number] endoscope was introduced through the anus and advanced to the cecum, which was identified by both the appendix and ileocecal valve.  The quality of the prep was excellent, using MoviPrep.  The instrument was then slowly withdrawn as the colon was fully examined. <<PROCEDUREIMAGES>>  FINDINGS:  There were multiple polyps identified and removed. 5 diminutove polyps btween 17 and 20 cm, 2-3 mm The polyps were removed using cold biopsy forceps (see image9, image8, image7, and image6).  Mild diverticulosis was found in the sigmoid colon (see image5 and image4).  This was otherwise a normal examination of the colon (see image1, image2, image3, and image10).   Retroflexed views in the rectum revealed no abnormalities.    The scope was then withdrawn from the patient and the procedure completed.  COMPLICATIONS:  None ENDOSCOPIC IMPRESSION: 1) Polyps, multiple 2) Mild diverticulosis in the sigmoid colon 3) Otherwise normal examination diminutive rectosigmoid polyps RECOMMENDATIONS: 1) Await pathology results 2) High fiber diet.  REPEAT EXAM:  In 10 year(s) for.  10 year recall if hyperplastic polyps  ______________________________ Lynn Morton. Juanda Chance, Lynn Kline  CC:  n. eSIGNED:   Hedwig Morton. [name] at  Lynn Kline  Lynn Kline, Lynn Kline

## 2012-03-19 ENCOUNTER — Telehealth: Payer: Self-pay | Admitting: *Deleted

## 2012-03-19 NOTE — Telephone Encounter (Signed)
Left message on f/u callback 

## 2012-03-20 ENCOUNTER — Encounter: Payer: Self-pay | Admitting: Internal Medicine

## 2012-03-20 LAB — HM COLONOSCOPY

## 2014-08-07 ENCOUNTER — Emergency Department (HOSPITAL_COMMUNITY): Payer: BC Managed Care – PPO

## 2014-08-07 ENCOUNTER — Encounter (HOSPITAL_COMMUNITY): Payer: Self-pay | Admitting: Emergency Medicine

## 2014-08-07 ENCOUNTER — Telehealth: Payer: Self-pay | Admitting: Internal Medicine

## 2014-08-07 ENCOUNTER — Emergency Department (HOSPITAL_COMMUNITY)
Admission: EM | Admit: 2014-08-07 | Discharge: 2014-08-07 | Disposition: A | Discharge status: Home / Self Care | Payer: BC Managed Care – PPO | Attending: Emergency Medicine | Admitting: Emergency Medicine

## 2014-08-07 DIAGNOSIS — R10812 Left upper quadrant abdominal tenderness: Secondary | ICD-10-CM | POA: Diagnosis not present

## 2014-08-07 DIAGNOSIS — Z720 Tobacco use: Secondary | ICD-10-CM | POA: Diagnosis not present

## 2014-08-07 DIAGNOSIS — R079 Chest pain, unspecified: Secondary | ICD-10-CM | POA: Diagnosis present

## 2014-08-07 DIAGNOSIS — Z90710 Acquired absence of both cervix and uterus: Secondary | ICD-10-CM | POA: Diagnosis not present

## 2014-08-07 DIAGNOSIS — J441 Chronic obstructive pulmonary disease with (acute) exacerbation: Secondary | ICD-10-CM | POA: Insufficient documentation

## 2014-08-07 DIAGNOSIS — Z880 Allergy status to penicillin: Secondary | ICD-10-CM | POA: Diagnosis not present

## 2014-08-07 DIAGNOSIS — R0789 Other chest pain: Secondary | ICD-10-CM | POA: Insufficient documentation

## 2014-08-07 DIAGNOSIS — Z79899 Other long term (current) drug therapy: Secondary | ICD-10-CM | POA: Insufficient documentation

## 2014-08-07 LAB — COMPREHENSIVE METABOLIC PANEL
ALBUMIN: 4.3 g/dL (ref 3.5–5.2)
ALT: 18 U/L (ref 0–35)
ANION GAP: 13 (ref 5–15)
AST: 17 U/L (ref 0–37)
Alkaline Phosphatase: 83 U/L (ref 39–117)
BILIRUBIN TOTAL: 0.2 mg/dL — AB (ref 0.3–1.2)
BUN: 16 mg/dL (ref 6–23)
CALCIUM: 10.1 mg/dL (ref 8.4–10.5)
CHLORIDE: 100 meq/L (ref 96–112)
CO2: 26 mEq/L (ref 19–32)
Creatinine, Ser: 0.75 mg/dL (ref 0.50–1.10)
GFR calc Af Amer: >90 mL/min (ref >90)
GFR calc non Af Amer: >90 mL/min (ref >90)
Glucose, Bld: 122 mg/dL — ABNORMAL HIGH (ref 70–99)
Potassium: 4.6 mEq/L (ref 3.7–5.3)
Sodium: 139 mEq/L (ref 137–147)
Total Protein: 7.5 g/dL (ref 6.0–8.3)

## 2014-08-07 LAB — CBC
HEMATOCRIT: 41.3 % (ref 36.0–46.0)
Hemoglobin: 14.1 g/dL (ref 12.0–15.0)
MCH: 30.9 pg (ref 26.0–34.0)
MCHC: 34.1 g/dL (ref 30.0–36.0)
MCV: 90.6 fL (ref 78.0–100.0)
PLATELETS: 337 10*3/uL (ref 150–400)
RBC: 4.56 MIL/uL (ref 3.87–5.11)
RDW: 12.5 % (ref 11.5–15.5)
WBC: 10.7 10*3/uL — AB (ref 4.0–10.5)

## 2014-08-07 LAB — I-STAT TROPONIN, ED
TROPONIN I, POC: 0 ng/mL (ref 0.00–0.08)
Troponin i, poc: 0 ng/mL (ref 0.00–0.08)

## 2014-08-07 LAB — PRO B NATRIURETIC PEPTIDE: PRO B NATRI PEPTIDE: 295.1 pg/mL — AB (ref 0–125)

## 2014-08-07 LAB — LIPASE, BLOOD: Lipase: 28 U/L (ref 11–59)

## 2014-08-07 MED ORDER — ONDANSETRON HCL 4 MG/2ML IJ SOLN
4.0000 mg | Freq: Once | INTRAMUSCULAR | Status: AC
  Administered 2014-08-07: 4 mg via INTRAVENOUS
  Filled 2014-08-07: qty 2

## 2014-08-07 MED ORDER — ASPIRIN 81 MG PO CHEW
324.0000 mg | CHEWABLE_TABLET | Freq: Once | ORAL | Status: AC
  Administered 2014-08-07: 324 mg via ORAL
  Filled 2014-08-07: qty 4

## 2014-08-07 MED ORDER — ALBUTEROL SULFATE (2.5 MG/3ML) 0.083% IN NEBU
5.0000 mg | INHALATION_SOLUTION | Freq: Once | RESPIRATORY_TRACT | Status: AC
  Administered 2014-08-07: 5 mg via RESPIRATORY_TRACT
  Filled 2014-08-07: qty 6

## 2014-08-07 MED ORDER — IPRATROPIUM BROMIDE 0.02 % IN SOLN
0.5000 mg | Freq: Once | RESPIRATORY_TRACT | Status: AC
  Administered 2014-08-07: 0.5 mg via RESPIRATORY_TRACT
  Filled 2014-08-07: qty 2.5

## 2014-08-07 MED ORDER — SODIUM CHLORIDE 0.9 % IV BOLUS (SEPSIS)
1000.0000 mL | Freq: Once | INTRAVENOUS | Status: AC
  Administered 2014-08-07: 1000 mL via INTRAVENOUS

## 2014-08-07 MED ORDER — OXYCODONE-ACETAMINOPHEN 5-325 MG PO TABS: 1.0000 | ORAL_TABLET | Freq: Four times a day (QID) | ORAL | Status: DC | PRN

## 2014-08-07 MED ORDER — ALBUTEROL SULFATE HFA 108 (90 BASE) MCG/ACT IN AERS
2.0000 | INHALATION_SPRAY | RESPIRATORY_TRACT | Status: DC | PRN
  Administered 2014-08-07: 2 via RESPIRATORY_TRACT
  Filled 2014-08-07: qty 6.7

## 2014-08-07 MED ORDER — PREDNISONE 20 MG PO TABS
60.0000 mg | ORAL_TABLET | Freq: Once | ORAL | Status: AC
  Administered 2014-08-07: 60 mg via ORAL
  Filled 2014-08-07: qty 3

## 2014-08-07 MED ORDER — MORPHINE SULFATE 4 MG/ML IJ SOLN
4.0000 mg | Freq: Once | INTRAMUSCULAR | Status: AC
  Administered 2014-08-07: 4 mg via INTRAVENOUS
  Filled 2014-08-07: qty 1

## 2014-08-07 MED ORDER — KETOROLAC TROMETHAMINE 30 MG/ML IJ SOLN
30.0000 mg | Freq: Once | INTRAMUSCULAR | Status: AC
  Administered 2014-08-07: 30 mg via INTRAVENOUS
  Filled 2014-08-07: qty 1

## 2014-08-07 MED ORDER — ONDANSETRON 4 MG PO TBDP: 4.0000 mg | ORAL_TABLET | Freq: Three times a day (TID) | ORAL | Status: DC | PRN

## 2014-08-07 MED ORDER — IOHEXOL 300 MG/ML  SOLN
50.0000 mL | Freq: Once | INTRAMUSCULAR | Status: AC | PRN
  Administered 2014-08-07: 50 mL via ORAL

## 2014-08-07 NOTE — ED Notes (Signed)
Pt c/o CP & SOB since this morning.  Pt w/ hx of COPD.  States that she has also has had a cough.  Vomited once an hour ago.

## 2014-08-07 NOTE — ED Notes (Signed)
Patient presents to ED with c/o left side CP, SOB which started this morning around 0500 Patient with hx of COPD Patient on cardiac monitor and noted to be in NSR Patient able to speak in full, complete sentences without difficulty--handles secretions Patient medicated, see MAR Patient in NAD Side rails up, call bell in reach and family at bedside

## 2014-08-07 NOTE — Telephone Encounter (Signed)
Patient Information:  Caller Name: Emeli  Phone: [phone]  Patient: Lynn Kline, Lynn Kline  Gender: Female  DOB: [birth year]  Age: 58 Years  PCP: Scarlette Calico (Adults only)  Office Follow Up:  Does the office need to follow up with this patient?: No  Instructions For The Office: N/A  RN Note:  Pt agreed to call EMS.  Symptoms  Reason For Call & Symptoms: States has pain around L ribcage (chest) front to back and under L arm since 5 am today with some mild nausea and one episode of vomiting and now feels like she has the chills but doesn't think she has a fever. States she has had intermittent "twinges" of pain in the L ribcage area for several weeks but had moderate to severe constant pain start this morning. Also describes some associated SOB.  Reviewed Health History In EMR: Yes  Reviewed Medications In EMR: Yes  Reviewed Allergies In EMR: Yes  Reviewed Surgeries / Procedures: Yes  Date of Onset of Symptoms: 08/07/2014  Guideline(s) Used:  Chest Pain  Disposition Per Guideline:   Call EMS 911 Now  Reason For Disposition Reached:   Chest pain lasting longer than 5 minutes and ANY of the following:  Over 39 years old Over 34 years old and at least one cardiac risk factor (i.e., high blood pressure, diabetes, high cholesterol, obesity, smoker or strong family history of heart disease) Pain is crushing, pressure-like, or heavy  Took nitroglycerin and chest pain was not relieved History of heart disease (i.e., angina, heart attack, bypass surgery, angioplasty, CHF)  Advice Given:  Lay with feet up until EMS arrives  Patient Will Follow Care Advice:  YES

## 2014-08-07 NOTE — ED Notes (Signed)
EDP at bedside  

## 2014-08-07 NOTE — ED Notes (Signed)
Patient transported to CT 

## 2014-08-07 NOTE — ED Notes (Signed)
Patient transported to X-ray via stretcher Patient in NAD upon leaving for testing 

## 2014-08-07 NOTE — ED Provider Notes (Signed)
CSN: [id number]     Arrival date & time 08/07/14  0865 History   First MD Initiated Contact with Patient 08/07/14 1011     Chief Complaint  Patient presents with  . Chest Pain  . Shortness of Breath     (Consider location/radiation/quality/duration/timing/severity/associated sxs/prior Treatment) HPI Comments: Patient with a history of COPD presents today with a chief complaint of chest pain and SOB. She reports that she began having the chest pain this morning at 5 AM when she woke up.  Pain has been constant since that time.  She describes the pain as a "squeezing pressure."  Pain located left lateral anterior chest and LUQ of her abdomen.  She reports that the pain radiates to the left side of her back. She states that she has had similar pain in the past, but is usually able to rub it and then it resolves.  She reports that the pain is associated with cough and SOB.   She tried using her Albuterol inhaler this morning, but did not feel that it helped.  She also reports that she had one episode of vomiting.  She denies numbness, tingling, diarrhea, LE edema, fever, diaphoresis, dizziness, or syncope.  She denies prior cardiac history.  Denies history of HTN, DM, or Hyperlipidemia.  She does smoke 0.5 ppd.  She denies history of PE or DVT.  Denies prolonged travel or surgeries in the past 4 weeks.  Denies any use of estrogen containing medications.  The history is provided by the patient.    Past Medical History  Diagnosis Date  . COPD (chronic obstructive pulmonary disease)    Past Surgical History  Procedure Laterality Date  . Abdominal hysterectomy     Family History  Problem Relation Age of Onset  . Arthritis Mother   . COPD Mother   . Emphysema Mother   . Stroke Father   . Heart disease Father   . Colon cancer Neg Hx    History  Substance Use Topics  . Smoking status: Current Every Day Smoker -- 0.50 packs/day for 35 years    Types: Cigarettes  . Smokeless tobacco: Never  Used  . Alcohol Use: No   OB History   Grav Para Term Preterm Abortions TAB SAB Ect Mult Living                 Review of Systems  All other systems reviewed and are negative.     Allergies  Ampicillin; Codeine; Penicillins; and Zithromax  Home Medications   Prior to Admission medications   Medication Sig Start Date End Date Taking? Authorizing Provider  Aclidinium Bromide (TUDORZA PRESSAIR) 400 MCG/ACT AEPB Inhale 1 puff into the lungs 2 (two) times daily. 11/15/11   Janith Lima, MD  albuterol (PROVENTIL HFA;VENTOLIN HFA) 108 (90 BASE) MCG/ACT inhaler Inhale 1-2 puffs into the lungs every 6 (six) hours as needed for wheezing. 10/23/11 10/22/12  Peter A. Lauris Poag, MD  ALPRAZolam (XANAX) 0.25 MG tablet Take 1 tablet (0.25 mg total) by mouth 3 (three) times daily as needed for sleep. 02/28/12 03/29/12  Janith Lima, MD   BP 170/99  Pulse 77  Temp(Src) 98.1 F (36.7 C) (Oral)  Resp 18  SpO2 100% Physical Exam  Nursing note and vitals reviewed. Constitutional: She appears well-developed and well-nourished.  HENT:  Head: Normocephalic and atraumatic.  Mouth/Throat: Oropharynx is clear and moist.  Neck: Normal range of motion.  Cardiovascular: Normal rate, regular rhythm and normal heart sounds.   Pulses:  Dorsalis pedis pulses are 2+ on the right side, and 2+ on the left side.  Pulmonary/Chest: Effort normal. No respiratory distress. She has wheezes. She has no rales. She exhibits tenderness.  Diffuse inspiratory and expiratory wheezing Patient speaking in complete sentences without difficulty  Abdominal: Soft. Bowel sounds are normal. She exhibits no distension and no mass. There is tenderness in the left upper quadrant. There is no rebound, no guarding and negative Murphy's sign.  Musculoskeletal: Normal range of motion.  No LE edema bilaterally  Neurological: She is alert.  Skin: Skin is warm and dry. She is not diaphoretic.  Psychiatric: She has a normal mood and  affect.    ED Course  Procedures (including critical care time) Labs Review Labs Reviewed  [place], ED    Imaging Review Ct Abdomen Pelvis W Contrast  08/07/2014   CLINICAL DATA:  Left upper quadrant abdominal tenderness. Initial encounter.  EXAM: CT ABDOMEN AND PELVIS WITH CONTRAST  TECHNIQUE: Multidetector CT imaging of the abdomen and pelvis was performed using the standard protocol following bolus administration of intravenous contrast.  CONTRAST:  31mL OMNIPAQUE IOHEXOL 300 MG/ML  SOLN  COMPARISON:  None.  FINDINGS: Lower chest: There is mild linear scarring or atelectasis at the right lung base. No significant pleural or pericardial effusion is present.  Hepatobiliary: The liver is normal in density without focal abnormality. There is a 2 cm calcified gallstone within the gallbladder lumen. The gallbladder is incompletely distended. There is no significant gallbladder wall thickening or surrounding inflammatory change. There is no biliary dilatation.  Pancreas: Unremarkable. No pancreatic ductal dilatation or surrounding inflammatory changes.  Spleen: Normal in size without focal abnormality.  Adrenals/Urinary Tract: Both adrenal glands appear normal.The kidneys appear normal without evidence of urinary tract calculus or hydronephrosis. No bladder abnormalities are seen.  Stomach/Bowel: No evidence of bowel wall thickening, distention or surrounding inflammatory change.There are mild sigmoid colon diverticular changes without surrounding inflammation. The appendix appears normal. There is no ascites or focal extraluminal fluid collection.  Vascular/Lymphatic: There are no enlarged abdominal or pelvic lymph nodes. There is mild atherosclerosis of the aorta, its branches and the iliac arteries.  Reproductive: Both ovaries appear normal status post partial hysterectomy. There is no adnexal mass or pelvic inflammatory process.  Other:  No evidence of abdominal wall mass or hernia.  Musculoskeletal: No acute or significant osseous findings.  IMPRESSION: 1. No acute findings or explanation for the patient's symptoms identified. 2. Large calcified gallstone. No evidence of gallbladder wall thickening or biliary dilatation. 3. Mild distal colonic diverticulosis without surrounding inflammation. 4. Diffuse atherosclerosis.   Electronically Signed   By: Camie Patience M.D.   On: 08/07/2014 19:34     EKG Interpretation   Date/Time:  Thursday August 07 2014 10:04:17 EDT Ventricular Rate:  77 PR Interval:  128 QRS Duration: 76 QT Interval:  395 QTC Calculation: 447 R Axis:   78 Text Interpretation:  Sinus rhythm No acute changes Confirmed by Kathrynn Humble,  MD, Thelma Comp [phone]) on 08/07/2014 12:39:14 PM     12:19 PM Reassessed patient.  She reports that she is still having pain, but her pain has improved at this time.  She also reports improvement in SOB. MDM   Final diagnoses:  None   Patient present today with a left sided chest pain, SOB, and LUQ abdominal pain.  Patient had initial wheezing on exam, which improved with breathing treatments and Prednisone.  She is not hypoxic and no signs of respiratory distress.  Initial troponin done 5 hours after onset of chest pain and was negative.  Delta troponin also negative.  Low HEART score.  No acute findings on CXR.  Labs unremarkable aside from mild leukocytosis.  No acute findings on CT Abdomen/Pelvis.  Gallstone present, but no evidence of Cholecystitis.  Pain improved during ED course.  Patient tolerating PO liquids.  Feel that the patient is stable for discharge.  Patient given referral to GI and also instructed to follow up with PCP.  Return precautions given.  Patient also evaluated by Dr. Kathrynn Humble.    Hyman Bible, PA-C 08/09/14 [place], PA-C 08/09/14 [phone]

## 2014-08-07 NOTE — ED Notes (Signed)
Patient back from radiology Patient in NAD Call bell in reach

## 2014-08-07 NOTE — ED Notes (Signed)
Patient and pt's family member updated as to plan of care--agree and v/u Patient up to bathroom without difficulty or assistance  Side rails up, call bell in reach

## 2014-08-07 NOTE — ED Notes (Signed)
EPIC back online s/p downtime Patient informed of delay r/t technical issues  Patient denies complaints or needs at this time VS updated  Awaiting CT scan

## 2014-08-08 ENCOUNTER — Telehealth: Payer: Self-pay | Admitting: Internal Medicine

## 2014-08-08 NOTE — Telephone Encounter (Signed)
emmi mailed  °

## 2014-08-10 NOTE — ED Provider Notes (Signed)
Medical screening examination/treatment/procedure(s) were performed by non-physician practitioner and as supervising physician I was immediately available for consultation/collaboration.   EKG Interpretation   Date/Time:  Thursday August 07 2014 10:04:17 EDT Ventricular Rate:  77 PR Interval:  128 QRS Duration: 76 QT Interval:  395 QTC Calculation: 447 R Axis:   78 Text Interpretation:  Sinus rhythm No acute changes Confirmed by Kathrynn Humble,  MD, Jamin Humphries ([id number]) on 08/07/2014 12:39:14 PM       Varney Biles, MD 08/10/14 3734

## 2014-08-11 ENCOUNTER — Encounter: Payer: Self-pay | Admitting: Gastroenterology

## 2014-08-12 ENCOUNTER — Encounter: Payer: Self-pay | Admitting: Physician Assistant

## 2014-08-12 ENCOUNTER — Ambulatory Visit (INDEPENDENT_AMBULATORY_CARE_PROVIDER_SITE_OTHER): Payer: BC Managed Care – PPO | Admitting: Physician Assistant

## 2014-08-12 VITALS — BP 105/70 | HR 81 | Ht 64.0 in | Wt 147.4 lb

## 2014-08-12 DIAGNOSIS — R1314 Dysphagia, pharyngoesophageal phase: Secondary | ICD-10-CM

## 2014-08-12 DIAGNOSIS — K589 Irritable bowel syndrome without diarrhea: Secondary | ICD-10-CM | POA: Insufficient documentation

## 2014-08-12 DIAGNOSIS — R1013 Epigastric pain: Secondary | ICD-10-CM

## 2014-08-12 MED ORDER — BENEFIBER PO POWD: ORAL | Status: DC

## 2014-08-12 MED ORDER — DICYCLOMINE HCL 10 MG PO CAPS: 10.0000 mg | ORAL_CAPSULE | Freq: Three times a day (TID) | ORAL | Status: DC | PRN

## 2014-08-12 MED ORDER — PANTOPRAZOLE SODIUM 40 MG PO TBEC: DELAYED_RELEASE_TABLET | ORAL | Status: DC

## 2014-08-12 NOTE — Progress Notes (Signed)
Reviewed and agree. I have openings this week.

## 2014-08-12 NOTE — Progress Notes (Signed)
Patient ID: Lynn Kline, female   DOB: 1955-10-22, 58 y.o.   MRN: [id number]    HPI: The patient is a 58 year old female referred by Dr. Ronnald Ramp due to a history of chest pain. Patient has history of COPD and presented to the emergency room with chief complaint of chest pain and shortness of breath on October 22. She felt nauseous and had been having epigastric and left upper quadrant pain and pain under her ribs. She had called the nursing line at her primary care physician's office and was advised to go to the ER where she was evaluated and ruled out for an MI. She had a CT scan that showed a gallstone but no ductal dilatation. Blood work in the emergency room was normal. She was sent out on Zofran and Percocet she has no heartburn but she feels nauseous after meals. She gets a burning in the epigastric area that radiates around to her back. She has not vomited since seen in the emergency room. She has not had any clay colored stools and has not had any dark urine. She does frequently use ibuprofen for headaches. She reports that she was seen 20-25 years ago and told she had gastroesophageal reflux. She used Zantac for a period of time but felt it caused constipation and so she discontinued it. Over the past several months she has been feeling as if her food gets stuck in the upper esophagus. This occurs primarily with meats and breads. She has not had to vomit to clear her esophagus but has had to give a period of time for she takes another bite in order for the piece of food to pass. This has been occurring once or twice a week for several months. She has no difficulty swallowing liquids.  2 she also states that for many years her stools tend to alternate between days of formed stools along with days of nugget like stools along with days of loose stools. She had a colonoscopy in May 2013 that revealed diverticulosis and multiple colon polyps. She has had no bright red blood per rectum and denies melena.  She has no oily stools. She states she often eats, developed left-sided abdominal cramping, and we'll then have a bowel movement with relief of her cramping. Her appetite has been good and her weight has been stable.   Past Medical History  Diagnosis Date  . COPD (chronic obstructive pulmonary disease)   . Anxiety   . Gallstone   . Diverticulosis   . Atherosclerosis   . Colon polyp, hyperplastic     Past Surgical History  Procedure Laterality Date  . Abdominal hysterectomy     Family History  Problem Relation Age of Onset  . Arthritis Mother   . COPD Mother   . Emphysema Mother   . Stroke Father   . Heart disease Father   . Colon cancer Neg Hx    History  Substance Use Topics  . Smoking status: Current Every Day Smoker -- 0.50 packs/day for 35 years    Types: Cigarettes  . Smokeless tobacco: Never Used  . Alcohol Use: No   Current Outpatient Prescriptions  Medication Sig Dispense Refill  . diphenhydramine-acetaminophen (TYLENOL PM) 25-500 MG TABS Take 0.5 tablets by mouth at bedtime as needed (for pain/sleep).      Marland Kitchen ibuprofen (ADVIL,MOTRIN) 200 MG tablet Take 200 mg by mouth every 6 (six) hours as needed for headache or moderate pain.      Marland Kitchen ondansetron (ZOFRAN ODT) 4  MG disintegrating tablet Take 1 tablet (4 mg total) by mouth every 8 (eight) hours as needed for nausea or vomiting.  20 tablet  0  . oxyCODONE-acetaminophen (PERCOCET/ROXICET) 5-325 MG per tablet Take 1-2 tablets by mouth every 6 (six) hours as needed for severe pain.  20 tablet  0   No current facility-administered medications for this visit.   Allergies  Allergen Reactions  . Codeine Nausea And Vomiting  . Erythromycin Nausea And Vomiting  . Ampicillin Nausea And Vomiting, Rash and Other (See Comments)    Pt states that rash was on her tongue.      Review of Systems: Gen: Denies any fever, chills, sweats, anorexia, fatigue, weakness, malaise, weight loss, and sleep disorder CV: Denies , angina,  palpitations, syncope, orthopnea, PND, peripheral edema, and claudication.Has some chest discomfort that comes and goes after meals and is relieved with belching. Resp: Denies dyspnea at rest, dyspnea with exercise, cough, sputum, wheezing, coughing up blood, and pleurisy. GI: Denies vomiting blood, jaundice, and fecal incontinence.   Denies dysphagia or odynophagia. GU : Denies urinary burning, blood in urine, urinary frequency, urinary hesitancy, nocturnal urination, and urinary incontinence. MS: Denies joint pain, limitation of movement, and swelling, stiffness, low back pain, extremity pain. Denies muscle weakness, cramps, atrophy.  Derm: Denies rash, itching, dry skin, hives, moles, warts, or unhealing ulcers.  Psych: Denies depression, anxiety, memory loss, suicidal ideation, hallucinations, paranoia, and confusion. Heme: Denies bruising, bleeding, and enlarged lymph nodes. Neuro:  Denies any headaches, dizziness, paresthesias. Endo:  Denies any problems with DM, thyroid, adrenal function  Studies: Dg Chest 2 View  08/07/2014   CLINICAL DATA:  Shortness of breath with cough and wheezing. History of asthma. History of smoking.  EXAM: CHEST  2 VIEW  COMPARISON:  10/23/2011.  FINDINGS: The heart size and mediastinal contours are within normal limits. Both lungs are clear. The visualized skeletal structures are unremarkable. Mild changes of COPD with hyperinflation and chronic peribronchial thickening.  IMPRESSION: No active cardiopulmonary disease.  Similar appearance to priors.   Electronically Signed   By: Rolla Flatten M.D.   On: 08/07/2014 11:27   Ct Abdomen Pelvis W Contrast  08/07/2014   CLINICAL DATA:  Left upper quadrant abdominal tenderness. Initial encounter.  EXAM: CT ABDOMEN AND PELVIS WITH CONTRAST  TECHNIQUE: Multidetector CT imaging of the abdomen and pelvis was performed using the standard protocol following bolus administration of intravenous contrast.  CONTRAST:  50mL OMNIPAQUE  IOHEXOL 300 MG/ML  SOLN  COMPARISON:  None.  FINDINGS: Lower chest: There is mild linear scarring or atelectasis at the right lung base. No significant pleural or pericardial effusion is present.  Hepatobiliary: The liver is normal in density without focal abnormality. There is a 2 cm calcified gallstone within the gallbladder lumen. The gallbladder is incompletely distended. There is no significant gallbladder wall thickening or surrounding inflammatory change. There is no biliary dilatation.  Pancreas: Unremarkable. No pancreatic ductal dilatation or surrounding inflammatory changes.  Spleen: Normal in size without focal abnormality.  Adrenals/Urinary Tract: Both adrenal glands appear normal.The kidneys appear normal without evidence of urinary tract calculus or hydronephrosis. No bladder abnormalities are seen.  Stomach/Bowel: No evidence of bowel wall thickening, distention or surrounding inflammatory change.There are mild sigmoid colon diverticular changes without surrounding inflammation. The appendix appears normal. There is no ascites or focal extraluminal fluid collection.  Vascular/Lymphatic: There are no enlarged abdominal or pelvic lymph nodes. There is mild atherosclerosis of the aorta, its branches and the iliac arteries.  Reproductive: Both ovaries appear normal status post partial hysterectomy. There is no adnexal mass or pelvic inflammatory process.  Other: No evidence of abdominal wall mass or hernia.  Musculoskeletal: No acute or significant osseous findings.  IMPRESSION: 1. No acute findings or explanation for the patient's symptoms identified. 2. Large calcified gallstone. No evidence of gallbladder wall thickening or biliary dilatation. 3. Mild distal colonic diverticulosis without surrounding inflammation. 4. Diffuse atherosclerosis.   Electronically Signed   By: Camie Patience M.D.   On: 08/07/2014 19:34    LAB RESULTS: Comprehensive metabolic panel on 36/14/4315 revealed an alkaline  phosphatase of 83, lipase of 28, AST of 17, ALT of 18, total bili 0.2. CBC on 08/07/2014 white blood cell count 10.7, hemoglobin 14.1, hematocrit 41.3, platelets 337,000.  Prior Endoscopies:   Colonoscopy on 03/16/2012 revealed multiple polyps, mild diverticulosis in the sigmoid colon, otherwise normal examination. She was advised to have a repeat colonoscopy in 10 years.  Physical Exam: BP 105/70  Pulse 81  Ht 5\' 4"  (1.626 m)  Wt 147 lb 6.4 oz (66.86 kg)  BMI 25.29 kg/m2  SpO2 99% Constitutional: Pleasant,well-developed, well nourished female in no acute distress. HEENT: Normocephalic and atraumatic. Conjunctivae are normal. No scleral icterus. Neck supple. No thyromegaly Cardiovascular: Normal rate, regular rhythm.  Pulmonary/chest: Effort normal and breath sounds normal. No wheezing, rales or rhonchi. Abdominal: Soft, nondistended, nontender. Bowel sounds active throughout. There are no masses palpable. No hepatomegaly. Extremities: no edema Lymphadenopathy: No cervical adenopathy noted. Neurological: Alert and oriented to person place and time. Skin: Skin is warm and dry. No rashes noted. Psychiatric: Normal mood and affect. Behavior is normal.  ASSESSMENT AND PLAN: #1 epigastric pain, dyspepsia, and dysphagia. These are likely due to ongoing reflux that has not been controlled. An antireflux regimen has been reviewed at length with the patient. She will be given a trial of pantoprazole 40 mg by mouth every morning 30 minutes prior to breakfast. She will be scheduled for an upper endoscopy to evaluate for possible esophagitis, gastritis, ulcer, or duodenitis.The risks, benefits, and alternatives to endoscopy with possible biopsy and possible dilation were discussed with the patient and they consent to proceed.   #2. Irritable bowel syndrome. Her symptoms of gas, bloating, and erratic bowel movements are likely related to IBS. He has been advised to use Benefiber 1 heaping tablespoon  daily and to try to add additional dietary fiber to her diet, decrease junk food, decreased fatty foods. She will be given a trial of dicyclomine 10 mg 3 times a day when necessary cramps.  Further recommendations and follow-up recommendations will be made pending the findings of her upper endoscopy.    Issacc Merlo, Vita Barley PA-C 08/12/2014, 9:14 AM

## 2014-08-12 NOTE — Patient Instructions (Signed)
You have been scheduled for an endoscopy. Please follow written instructions given to you at your visit today. If you use inhalers (even only as needed), please bring them with you on the day of your procedure. Your physician has requested that you go to www.startemmi.com and enter the access code given to you at your visit today. This web site gives a general overview about your procedure. However, you should still follow specific instructions given to you by our office regarding your preparation for the procedure.  We have sent the following medications to your pharmacy for you to pick up at your convenience: pantoprazole Dicyclomine  Please purchase Benefiber OTC and take 1 tablespoon in water or juice daily.  Gastroesophageal Reflux Disease, Adult Gastroesophageal reflux disease (GERD) happens when acid from your stomach flows up into the esophagus. When acid comes in contact with the esophagus, the acid causes soreness (inflammation) in the esophagus. Over time, GERD may create small holes (ulcers) in the lining of the esophagus. CAUSES   Increased body weight. This puts pressure on the stomach, making acid rise from the stomach into the esophagus.  Smoking. This increases acid production in the stomach.  Drinking alcohol. This causes decreased pressure in the lower esophageal sphincter (valve or ring of muscle between the esophagus and stomach), allowing acid from the stomach into the esophagus.  Late evening meals and a full stomach. This increases pressure and acid production in the stomach.  A malformed lower esophageal sphincter. Sometimes, no cause is found. SYMPTOMS   Burning pain in the lower part of the mid-chest behind the breastbone and in the mid-stomach area. This may occur twice a week or more often.  Trouble swallowing.  Sore throat.  Dry cough.  Asthma-like symptoms including chest tightness, shortness of breath, or wheezing. DIAGNOSIS  Your caregiver may be  able to diagnose GERD based on your symptoms. In some cases, X-rays and other tests may be done to check for complications or to check the condition of your stomach and esophagus. TREATMENT  Your caregiver may recommend over-the-counter or prescription medicines to help decrease acid production. Ask your caregiver before starting or adding any new medicines.  HOME CARE INSTRUCTIONS   Change the factors that you can control. Ask your caregiver for guidance concerning weight loss, quitting smoking, and alcohol consumption.  Avoid foods and drinks that make your symptoms worse, such as:  Caffeine or alcoholic drinks.  Chocolate.  Peppermint or mint flavorings.  Garlic and onions.  Spicy foods.  Citrus fruits, such as oranges, lemons, or limes.  Tomato-based foods such as sauce, chili, salsa, and pizza.  Fried and fatty foods.  Avoid lying down for the 3 hours prior to your bedtime or prior to taking a nap.  Eat small, frequent meals instead of large meals.  Wear loose-fitting clothing. Do not wear anything tight around your waist that causes pressure on your stomach.  Raise the head of your bed 6 to 8 inches with wood blocks to help you sleep. Extra pillows will not help.  Only take over-the-counter or prescription medicines for pain, discomfort, or fever as directed by your caregiver.  Do not take aspirin, ibuprofen, or other nonsteroidal anti-inflammatory drugs (NSAIDs). SEEK IMMEDIATE MEDICAL CARE IF:   You have pain in your arms, neck, jaw, teeth, or back.  Your pain increases or changes in intensity or duration.  You develop nausea, vomiting, or sweating (diaphoresis).  You develop shortness of breath, or you faint.  Your vomit is green,  yellow, black, or looks like coffee grounds or blood.  Your stool is red, bloody, or black. These symptoms could be signs of other problems, such as heart disease, gastric bleeding, or esophageal bleeding. MAKE SURE YOU:    Understand these instructions.  Will watch your condition.  Will get help right away if you are not doing well or get worse. Document Released: 07/13/2005 Document Revised: 12/26/2011 Document Reviewed: 04/22/2011 [facility] Patient Information 2015 [place], [place]. This information is not intended to replace advice given to you by your health care provider. Make sure you discuss any questions you have with your health care provider.

## 2014-08-13 ENCOUNTER — Inpatient Hospital Stay: Payer: BC Managed Care – PPO | Admitting: Internal Medicine

## 2014-08-13 ENCOUNTER — Ambulatory Visit (AMBULATORY_SURGERY_CENTER): Payer: BC Managed Care – PPO | Admitting: Internal Medicine

## 2014-08-13 ENCOUNTER — Other Ambulatory Visit: Payer: Self-pay

## 2014-08-13 ENCOUNTER — Encounter: Payer: Self-pay | Admitting: Internal Medicine

## 2014-08-13 VITALS — BP 109/62 | HR 72 | Temp 97.9°F | Resp 19 | Ht 64.0 in | Wt 147.0 lb

## 2014-08-13 DIAGNOSIS — K319 Disease of stomach and duodenum, unspecified: Secondary | ICD-10-CM

## 2014-08-13 DIAGNOSIS — R1013 Epigastric pain: Secondary | ICD-10-CM

## 2014-08-13 DIAGNOSIS — R1310 Dysphagia, unspecified: Secondary | ICD-10-CM

## 2014-08-13 DIAGNOSIS — R1314 Dysphagia, pharyngoesophageal phase: Secondary | ICD-10-CM

## 2014-08-13 DIAGNOSIS — K8020 Calculus of gallbladder without cholecystitis without obstruction: Secondary | ICD-10-CM

## 2014-08-13 DIAGNOSIS — K222 Esophageal obstruction: Secondary | ICD-10-CM

## 2014-08-13 DIAGNOSIS — R1319 Other dysphagia: Secondary | ICD-10-CM

## 2014-08-13 MED ORDER — SODIUM CHLORIDE 0.9 % IV SOLN: 500.0000 mL | INTRAVENOUS | Status: DC

## 2014-08-13 NOTE — Patient Instructions (Signed)
YOU ARE SCHEDULED FOR HIDDA SCAN ON Friday, August 22, 2014 AT [place]. NOTHING TO EAT OR DRINK AFTER MIDNIGHT. ARRIVE AT 9:15 FOR THE PROCEDURE AT 9:30.   YOU HAD AN ENDOSCOPIC PROCEDURE TODAY AT [facility]: Refer to the procedure report that was given to you for any specific questions about what was found during the examination.  If the procedure report does not answer your questions, please call your gastroenterologist to clarify.  If you requested that your care partner not be given the details of your procedure findings, then the procedure report has been included in a sealed envelope for you to review at your convenience later.  YOU SHOULD EXPECT: Some feelings of bloating in the abdomen. Passage of more gas than usual.  Walking can help get rid of the air that was put into your GI tract during the procedure and reduce the bloating. If you had a lower endoscopy (such as a colonoscopy or flexible sigmoidoscopy) you may notice spotting of blood in your stool or on the toilet paper. If you underwent a bowel prep for your procedure, then you may not have a normal bowel movement for a few days.  DIET:  NOTHING TO EAT OR DRINK UNTIL 12:00. 12:00 UNTIL 1:00 ONLY CLEAR LIQUIDS. AFTER 1:00 UNTIL MORNING ONLY SOFT FOODS. RESUME YOUR REGULAR DIET IN AM. AVOID FRIED FOODS.  ACTIVITY: Your care partner should take you home directly after the procedure.  You should plan to take it easy, moving slowly for the rest of the day.  You can resume normal activity the day after the procedure however you should NOT DRIVE or use heavy machinery for 24 hours (because of the sedation medicines used during the test).    SYMPTOMS TO REPORT IMMEDIATELY: A gastroenterologist can be reached at any hour.  During normal business hours, 8:30 AM to 5:00 PM Monday through Friday, call [phone].  After hours and on weekends, please call the GI answering service at [phone] who will  take a message and have the physician on call contact you.   Following upper endoscopy (EGD)  Vomiting of blood or coffee ground material  New chest pain or pain under the shoulder blades  Painful or persistently difficult swallowing  New shortness of breath  Fever of 100F or higher  Black, tarry-looking stools  FOLLOW UP: If any biopsies were taken you will be contacted by phone or by letter within the next 1-3 weeks.  Call your gastroenterologist if you have not heard about the biopsies in 3 weeks.  Our staff will call the home number listed on your records the next business day following your procedure to check on you and address any questions or concerns that you may have at that time regarding the information given to you following your procedure. This is a courtesy call and so if there is no answer at the home number and we have not heard from you through the emergency physician on call, we will assume that you have returned to your regular daily activities without incident.  SIGNATURES/CONFIDENTIALITY: You and/or your care partner have signed paperwork which will be entered into your electronic medical record.  These signatures attest to the fact that that the information above on your After Visit Summary has been reviewed and is understood.  Full responsibility of the confidentiality of this discharge information lies with you and/or your care-partner.

## 2014-08-13 NOTE — Progress Notes (Signed)
Called to room to assist during endoscopic procedure.  Patient ID and intended procedure confirmed with present staff. Received instructions for my participation in the procedure from the performing physician.  

## 2014-08-13 NOTE — Progress Notes (Signed)
Awake alertr and oriented, pleased with anesthesia care, vss, report to Delia Chimes, DRM

## 2014-08-13 NOTE — Progress Notes (Signed)
INSTRUCTIONS GIVEN FOR HIDDA SCAN. PATIENT STABLE ON DISCHARGE, NO SUBQ AIR PALPATED NECK OR CHEST.

## 2014-08-13 NOTE — Op Note (Signed)
[facility]  [address] [place], [id number]   ENDOSCOPY PROCEDURE REPORT  PATIENT: Lynn Kline, Lynn Kline  MR#: [id number] BIRTHDATE: 02/08/1956 , 27  yrs. old GENDER: female ENDOSCOPIST: Lafayette Dragon, MD REFERRED BY:  Janith Lima, M.D. PROCEDURE DATE:  08/13/2014 PROCEDURE:  EGD w/ biopsy and Savary dilation of esophagus ASA CLASS:     Class II INDICATIONS:  dysphagia, epigastric pain, and aacute episode of chest and epigastric pain.  Cardiac causes are ruled out.  Large 2 cm gallstone found.  Intermittent dysphagia predominantly to solids..  Patient taking ibuprofen. MEDICATIONS: Monitored anesthesia care and Propofol 150 mg IV TOPICAL ANESTHETIC: none  DESCRIPTION OF PROCEDURE: After the risks benefits and alternatives of the procedure were thoroughly explained, informed consent was obtained.  The [id number] endoscope was introduced through the mouth and advanced to the second portion of the duodenum , Without limitations.  The instrument was slowly withdrawn as the mucosa was fully examined.    Esophagus: proximal, mid and distal esophageal mucosa was normal. There was a benign appearing concentric fibrous stricture at the GE junction which easily allowed the endoscope to traverse into the stomach. There were 2 erosions within the stricture consistent with grade a esophagitis. There was a 2-3 cm hiatal hernia distal to the stricture which was easily reducible Stomach: gastric folds were normal. Gastric antrum and pyloric outlet was normal. Retroflexion of the scope revealed normal fundus and cardia, biopsies were taken from gastric antrum to rule out H. pylori Duodenum: duodenal bulb and descending duodenum were normal Guidewire was then placed through the endoscope into the gastric antrum and Savary dilators passed over the guidewire without fluoroscopic guidance starting with 14, 15 and 16 mm dilators. There was no blood on any on the  dilators.         The scope was then withdrawn from the patient and the procedure completed.  COMPLICATIONS: There were no immediate complications.  ENDOSCOPIC IMPRESSION:  1.grade a esophagitis 2.Benign-appearing nonobstructing esophageal stricture 3.. Dilated to 16 mm 4. 2-3 cm hiatal hernia, easily reducible 5.Biopsies from gastric antrum to rule out H. pylori  RECOMMENDATIONS: 1.  Await biopsy results 2.  Anti-reflux regimen to be follow 3.  Continue current meds 4.  HIDA scan with CCK  REPEAT EXAM: for EGD pending biopsy results.  eSigned:  Lafayette Dragon, MD 08/13/2014 11:00 AM    CC:  PATIENT NAME:  Lynn Kline, Lynn Kline MR#: [id number]

## 2014-08-14 ENCOUNTER — Telehealth: Payer: Self-pay | Admitting: Internal Medicine

## 2014-08-14 ENCOUNTER — Telehealth: Payer: Self-pay

## 2014-08-14 NOTE — Telephone Encounter (Signed)
emmi emailed °

## 2014-08-14 NOTE — Telephone Encounter (Signed)
  Follow up Call-  Call back number 08/13/2014 03/16/2012  Post procedure Call Back phone  # [phone]  Permission to leave phone message Yes Yes     Patient questions:  Do you have a fever, pain , or abdominal swelling? No. Pain Score  0 *  Have you tolerated food without any problems? Yes.    Have you been able to return to your normal activities? Yes.    Do you have any questions about your discharge instructions: Diet   No. Medications  No. Follow up visit  No.  Do you have questions or concerns about your Care? No.  Actions: * If pain score is 4 or above: No action needed, pain <4.

## 2014-08-19 ENCOUNTER — Encounter: Payer: Self-pay | Admitting: Internal Medicine

## 2014-08-19 ENCOUNTER — Other Ambulatory Visit (INDEPENDENT_AMBULATORY_CARE_PROVIDER_SITE_OTHER): Payer: BC Managed Care – PPO

## 2014-08-19 ENCOUNTER — Ambulatory Visit (INDEPENDENT_AMBULATORY_CARE_PROVIDER_SITE_OTHER): Payer: BC Managed Care – PPO | Admitting: Internal Medicine

## 2014-08-19 ENCOUNTER — Ambulatory Visit (INDEPENDENT_AMBULATORY_CARE_PROVIDER_SITE_OTHER): Payer: BC Managed Care – PPO

## 2014-08-19 VITALS — BP 118/72 | HR 78 | Temp 98.0°F | Resp 16 | Ht 64.0 in | Wt 147.0 lb

## 2014-08-19 DIAGNOSIS — E785 Hyperlipidemia, unspecified: Secondary | ICD-10-CM | POA: Insufficient documentation

## 2014-08-19 DIAGNOSIS — R739 Hyperglycemia, unspecified: Secondary | ICD-10-CM

## 2014-08-19 DIAGNOSIS — J449 Chronic obstructive pulmonary disease, unspecified: Secondary | ICD-10-CM

## 2014-08-19 DIAGNOSIS — Z124 Encounter for screening for malignant neoplasm of cervix: Secondary | ICD-10-CM | POA: Insufficient documentation

## 2014-08-19 DIAGNOSIS — Z1231 Encounter for screening mammogram for malignant neoplasm of breast: Secondary | ICD-10-CM

## 2014-08-19 DIAGNOSIS — F419 Anxiety disorder, unspecified: Secondary | ICD-10-CM

## 2014-08-19 DIAGNOSIS — Z23 Encounter for immunization: Secondary | ICD-10-CM

## 2014-08-19 LAB — HEMOGLOBIN A1C: Hgb A1c MFr Bld: 5.8 % (ref 4.6–6.5)

## 2014-08-19 LAB — BASIC METABOLIC PANEL
BUN: 14 mg/dL (ref 6–23)
CALCIUM: 9.2 mg/dL (ref 8.4–10.5)
CHLORIDE: 104 meq/L (ref 96–112)
CO2: 29 mEq/L (ref 19–32)
CREATININE: 0.8 mg/dL (ref 0.4–1.2)
GFR: 82.71 mL/min (ref >60.00)
Glucose, Bld: 92 mg/dL (ref 70–99)
Potassium: 4.1 mEq/L (ref 3.5–5.1)
Sodium: 138 mEq/L (ref 135–145)

## 2014-08-19 LAB — LIPID PANEL
Cholesterol: 234 mg/dL — ABNORMAL HIGH (ref 0–200)
HDL: 58 mg/dL (ref >39.00)
LDL Cholesterol: 151 mg/dL — ABNORMAL HIGH (ref 0–99)
NonHDL: 176
TRIGLYCERIDES: 125 mg/dL (ref 0.0–149.0)
Total CHOL/HDL Ratio: 4
VLDL: 25 mg/dL (ref 0.0–40.0)

## 2014-08-19 LAB — TSH: TSH: 1.24 u[IU]/mL (ref 0.35–4.50)

## 2014-08-19 MED ORDER — ALPRAZOLAM 0.25 MG PO TABS: 0.2500 mg | ORAL_TABLET | Freq: Three times a day (TID) | ORAL | Status: AC | PRN

## 2014-08-19 MED ORDER — UMECLIDINIUM-VILANTEROL 62.5-25 MCG/INH IN AEPB: 1.0000 | INHALATION_SPRAY | Freq: Every day | RESPIRATORY_TRACT | Status: DC

## 2014-08-19 NOTE — Assessment & Plan Note (Signed)
Will check her FLP and TSH today At this time she does not want to start a statin

## 2014-08-19 NOTE — Progress Notes (Signed)
Subjective:    Patient ID: Lynn Kline, female    DOB: 01-14-1956, 58 y.o.   MRN: [id number]  Cough This is a recurrent problem. The current episode started more than 1 year ago. The problem has been unchanged. The problem occurs every few hours. The cough is non-productive. Associated symptoms include shortness of breath and wheezing. Pertinent negatives include no chest pain, chills, ear congestion, ear pain, fever, headaches, heartburn, hemoptysis, myalgias, nasal congestion, postnasal drip, rash, rhinorrhea, sore throat, sweats or weight loss. The symptoms are aggravated by cold air. Risk factors for lung disease include smoking/tobacco exposure. She has tried a beta-agonist inhaler for the symptoms. The treatment provided moderate relief. Her past medical history is significant for bronchitis and COPD. There is no history of asthma, bronchiectasis, emphysema, environmental allergies or pneumonia.      Review of Systems  Constitutional: Negative.  Negative for fever, chills, weight loss, diaphoresis, appetite change and fatigue.  HENT: Negative.  Negative for ear pain, postnasal drip, rhinorrhea and sore throat.   Eyes: Negative.   Respiratory: Positive for cough, shortness of breath and wheezing. Negative for apnea, hemoptysis, choking, chest tightness and stridor.   Cardiovascular: Negative.  Negative for chest pain, palpitations and leg swelling.  Gastrointestinal: Negative.  Negative for heartburn, nausea, vomiting, abdominal pain, diarrhea, constipation and blood in stool.  Endocrine: Negative.   Genitourinary: Negative.   Musculoskeletal: Negative.  Negative for myalgias, back pain and arthralgias.  Skin: Negative.  Negative for rash.  Allergic/Immunologic: Negative.  Negative for environmental allergies.  Neurological: Negative.  Negative for headaches.  Hematological: Negative.  Negative for adenopathy. Does not bruise/bleed easily.  Psychiatric/Behavioral: Positive for  sleep disturbance. Negative for suicidal ideas, hallucinations, behavioral problems, confusion, self-injury, dysphoric mood, decreased concentration and agitation. The patient is nervous/anxious. The patient is not hyperactive.        Objective:   Physical Exam  Constitutional: She is oriented to person, place, and time. She appears well-developed and well-nourished.  Non-toxic appearance. She does not have a sickly appearance. She does not appear ill. No distress.  HENT:  Head: Normocephalic and atraumatic.  Mouth/Throat: Oropharynx is clear and moist. No oropharyngeal exudate.  Eyes: Conjunctivae are normal. Right eye exhibits no discharge. Left eye exhibits no discharge. No scleral icterus.  Neck: Normal range of motion. Neck supple. No JVD present. No tracheal deviation present. No thyromegaly present.  Cardiovascular: Normal rate, regular rhythm, normal heart sounds and intact distal pulses.  Exam reveals no gallop and no friction rub.   No murmur heard. Pulmonary/Chest: Effort normal. No accessory muscle usage or stridor. No respiratory distress. She has wheezes (occasional exp wheezes in the mid-zones) in the right middle field and the left middle field. She has rhonchi in the right middle field and the left middle field. She has no rales. She exhibits no tenderness.  Abdominal: Soft. Bowel sounds are normal. She exhibits no distension and no mass. There is no tenderness. There is no rebound and no guarding.  Musculoskeletal: Normal range of motion. She exhibits no edema or tenderness.  Lymphadenopathy:    She has no cervical adenopathy.  Neurological: She is oriented to person, place, and time.  Skin: Skin is warm and dry. No rash noted. She is not diaphoretic. No erythema. No pallor.  Vitals reviewed.    Lab Results  Component Value Date   WBC 10.7* 08/07/2014   HGB 14.1 08/07/2014   HCT 41.3 08/07/2014   PLT 337 08/07/2014   GLUCOSE  122* 08/07/2014   CHOL 220* 11/15/2011    TRIG 105.0 11/15/2011   HDL 61.20 11/15/2011   LDLDIRECT 141.0 11/15/2011   ALT 18 08/07/2014   AST 17 08/07/2014   NA 139 08/07/2014   K 4.6 08/07/2014   CL 100 08/07/2014   CREATININE 0.75 08/07/2014   BUN 16 08/07/2014   CO2 26 08/07/2014   TSH 0.80 11/15/2011   HGBA1C 5.9 11/15/2011       Assessment & Plan:

## 2014-08-19 NOTE — Assessment & Plan Note (Signed)
She will cont xanax as needed She is not willing to take an SSRI

## 2014-08-19 NOTE — Patient Instructions (Signed)

## 2014-08-19 NOTE — Assessment & Plan Note (Signed)
She is trying to quit smoking and I have encouraged her to cont to try to stop smoking I have asked her to add anoro to the albuterol - I gave her a sample of anoro and I showed her how to use it and  She demonstrated proficiency with its use.

## 2014-08-19 NOTE — Assessment & Plan Note (Signed)
I will check her A1C to see if she has developed DM2 

## 2014-08-19 NOTE — Progress Notes (Signed)
Pre visit review using our clinic review tool, if applicable. No additional management support is needed unless otherwise documented below in the visit note. 

## 2014-08-20 ENCOUNTER — Encounter: Payer: Self-pay | Admitting: Internal Medicine

## 2014-08-22 ENCOUNTER — Ambulatory Visit (HOSPITAL_COMMUNITY)
Admission: RE | Admit: 2014-08-22 | Discharge: 2014-08-22 | Disposition: A | Discharge status: Home / Self Care | Payer: BC Managed Care – PPO | Source: Ambulatory Visit | Attending: Diagnostic Radiology | Admitting: Diagnostic Radiology

## 2014-08-22 DIAGNOSIS — R1013 Epigastric pain: Secondary | ICD-10-CM | POA: Insufficient documentation

## 2014-08-22 DIAGNOSIS — K8020 Calculus of gallbladder without cholecystitis without obstruction: Secondary | ICD-10-CM | POA: Diagnosis not present

## 2014-08-22 MED ORDER — SINCALIDE 5 MCG IJ SOLR
0.0200 ug/kg | Freq: Once | INTRAMUSCULAR | Status: AC
  Administered 2014-08-22: 1.3 ug via INTRAVENOUS

## 2014-08-22 MED ORDER — TECHNETIUM TC 99M MEBROFENIN IV KIT
4.2000 | PACK | Freq: Once | INTRAVENOUS | Status: AC | PRN
  Administered 2014-08-22: 4 via INTRAVENOUS

## 2014-08-26 ENCOUNTER — Telehealth: Payer: Self-pay | Admitting: *Deleted

## 2014-08-26 NOTE — Telephone Encounter (Signed)
Spoke with Lynn Kline and she has scheduled patient on 09/04/14 at 1:50 PM with Dr. Marlou Starks. Patient is aware.

## 2014-09-04 ENCOUNTER — Other Ambulatory Visit (INDEPENDENT_AMBULATORY_CARE_PROVIDER_SITE_OTHER): Payer: Self-pay | Admitting: General Surgery

## 2014-09-19 ENCOUNTER — Ambulatory Visit: Payer: Self-pay | Admitting: Gynecology

## 2014-09-22 ENCOUNTER — Ambulatory Visit: Payer: Self-pay | Admitting: Gynecology

## 2014-10-23 ENCOUNTER — Other Ambulatory Visit (HOSPITAL_COMMUNITY)
Admission: RE | Admit: 2014-10-23 | Discharge: 2014-10-23 | Disposition: A | Discharge status: Home / Self Care | Payer: BLUE CROSS/BLUE SHIELD | Source: Ambulatory Visit | Attending: Gynecology | Admitting: Gynecology

## 2014-10-23 ENCOUNTER — Ambulatory Visit (INDEPENDENT_AMBULATORY_CARE_PROVIDER_SITE_OTHER): Payer: BLUE CROSS/BLUE SHIELD | Admitting: Gynecology

## 2014-10-23 ENCOUNTER — Encounter: Payer: Self-pay | Admitting: Gynecology

## 2014-10-23 VITALS — BP 110/70 | Ht 62.75 in | Wt 149.0 lb

## 2014-10-23 DIAGNOSIS — F17200 Nicotine dependence, unspecified, uncomplicated: Secondary | ICD-10-CM

## 2014-10-23 DIAGNOSIS — Z01419 Encounter for gynecological examination (general) (routine) without abnormal findings: Secondary | ICD-10-CM

## 2014-10-23 DIAGNOSIS — R8290 Unspecified abnormal findings in urine: Secondary | ICD-10-CM

## 2014-10-23 DIAGNOSIS — Z1151 Encounter for screening for human papillomavirus (HPV): Secondary | ICD-10-CM | POA: Diagnosis present

## 2014-10-23 DIAGNOSIS — Z720 Tobacco use: Secondary | ICD-10-CM

## 2014-10-23 LAB — URINALYSIS W MICROSCOPIC + REFLEX CULTURE
Bilirubin Urine: NEGATIVE
CASTS: NONE SEEN
Crystals: NONE SEEN
Glucose, UA: NEGATIVE mg/dL
Ketones, ur: NEGATIVE mg/dL
LEUKOCYTES UA: NEGATIVE
Nitrite: NEGATIVE
PH: 5.5 (ref 5.0–8.0)
Protein, ur: NEGATIVE mg/dL
Specific Gravity, Urine: <1.005 — ABNORMAL LOW (ref 1.005–1.030)
UROBILINOGEN UA: 0.2 mg/dL (ref 0.0–1.0)
WBC, UA: NONE SEEN WBC/hpf (ref <3)

## 2014-10-23 NOTE — Patient Instructions (Signed)
Varenicline oral tablets What is this medicine? VARENICLINE (var EN i kleen) is used to help people quit smoking. It can reduce the symptoms caused by stopping smoking. It is used with a patient support program recommended by your physician. This medicine may be used for other purposes; ask your health care provider or pharmacist if you have questions. COMMON BRAND NAME(S): Chantix What should I tell my health care provider before I take this medicine? They need to know if you have any of these conditions: -bipolar disorder, depression, schizophrenia or other mental illness -heart disease -if you often drink alcohol -kidney disease -peripheral vascular disease -seizures -stroke -suicidal thoughts, plans, or attempt; a previous suicide attempt by you or a family member -an unusual or allergic reaction to varenicline, other medicines, foods, dyes, or preservatives -pregnant or trying to get pregnant -breast-feeding How should I use this medicine? You should set a date to stop smoking and tell your doctor. Start this medicine one week before the quit date. You can also start taking this medicine before you choose a quit date, and then pick a quit date that is between 8 and 35 days of treatment with this medicine. Stick to your plan; ask about support groups or other ways to help you remain a 'quitter'. Take this medicine by mouth after eating. Take with a full glass of water. Follow the directions on the prescription label. Take your doses at regular intervals. Do not take your medicine more often than directed. A special MedGuide will be given to you by the pharmacist with each prescription and refill. Be sure to read this information carefully each time. Talk to your pediatrician regarding the use of this medicine in children. This medicine is not approved for use in children. Overdosage: If you think you have taken too much of this medicine contact a poison control center or emergency room at  once. NOTE: This medicine is only for you. Do not share this medicine with others. What if I miss a dose? If you miss a dose, take it as soon as you can. If it is almost time for your next dose, take only that dose. Do not take double or extra doses. What may interact with this medicine? -alcohol or any product that contains alcohol -insulin -other stop smoking aids -theophylline -warfarin This list may not describe all possible interactions. Give your health care provider a list of all the medicines, herbs, non-prescription drugs, or dietary supplements you use. Also tell them if you smoke, drink alcohol, or use illegal drugs. Some items may interact with your medicine. What should I watch for while using this medicine? Visit your doctor or health care professional for regular check ups. Ask for ongoing advice and encouragement from your doctor or healthcare professional, friends, and family to help you quit. If you smoke while on this medication, quit again Your mouth may get dry. Chewing sugarless gum or sucking hard candy, and drinking plenty of water may help. Contact your doctor if the problem does not go away or is severe. You may get drowsy or dizzy. Do not drive, use machinery, or do anything that needs mental alertness until you know how this medicine affects you. Do not stand or sit up quickly, especially if you are an older patient. This reduces the risk of dizzy or fainting spells. The use of this medicine may increase the chance of suicidal thoughts or actions. Pay special attention to how you are responding while on this medicine. Any worsening of   mood, or thoughts of suicide or dying should be reported to your health care professional right away. What side effects may I notice from receiving this medicine? Side effects that you should report to your doctor or health care professional as soon as possible: -allergic reactions like skin rash, itching or hives, swelling of the face,  lips, tongue, or throat -breathing problems -changes in vision -chest pain or chest tightness -confusion, trouble speaking or understanding -fast, irregular heartbeat -feeling faint or lightheaded, falls -fever -pain in legs when walking -problems with balance, talking, walking -redness, blistering, peeling or loosening of the skin, including inside the mouth -ringing in ears -seizures -sudden numbness or weakness of the face, arm or leg -suicidal thoughts or other mood changes -trouble passing urine or change in the amount of urine -unusual bleeding or bruising -unusually weak or tired Side effects that usually do not require medical attention (report to your doctor or health care professional if they continue or are bothersome): -constipation -headache -nausea, vomiting -strange dreams -stomach gas -trouble sleeping This list may not describe all possible side effects. Call your doctor for medical advice about side effects. You may report side effects to FDA at 1-800-FDA-1088. Where should I keep my medicine? Keep out of the reach of children. Store at room temperature between 15 and 30 degrees C (59 and 86 degrees F). Throw away any unused medicine after the expiration date. NOTE: This sheet is a summary. It may not cover all possible information. If you have questions about this medicine, talk to your doctor, pharmacist, or health care provider.  2015, Elsevier/Gold Standard. (2013-07-15 13:37:47) Smoking Cessation Quitting smoking is important to your health and has many advantages. However, it is not always easy to quit since nicotine is a very addictive drug. Oftentimes, people try 3 times or more before being able to quit. This document explains the best ways for you to prepare to quit smoking. Quitting takes hard work and a lot of effort, but you can do it. ADVANTAGES OF QUITTING SMOKING  You will live longer, feel better, and live better.  Your body will feel the  impact of quitting smoking almost immediately.  Within 20 minutes, blood pressure decreases. Your pulse returns to its normal level.  After 8 hours, carbon monoxide levels in the blood return to normal. Your oxygen level increases.  After 24 hours, the chance of having a heart attack starts to decrease. Your breath, hair, and body stop smelling like smoke.  After 48 hours, damaged nerve endings begin to recover. Your sense of taste and smell improve.  After 72 hours, the body is virtually free of nicotine. Your bronchial tubes relax and breathing becomes easier.  After 2 to 12 weeks, lungs can hold more air. Exercise becomes easier and circulation improves.  The risk of having a heart attack, stroke, cancer, or lung disease is greatly reduced.  After 1 year, the risk of coronary heart disease is cut in half.  After 5 years, the risk of stroke falls to the same as a nonsmoker.  After 10 years, the risk of lung cancer is cut in half and the risk of other cancers decreases significantly.  After 15 years, the risk of coronary heart disease drops, usually to the level of a nonsmoker.  If you are pregnant, quitting smoking will improve your chances of having a healthy baby.  The people you live with, especially any children, will be healthier.  You will have extra money to spend on things  other than cigarettes. QUESTIONS TO THINK ABOUT BEFORE ATTEMPTING TO QUIT You may want to talk about your answers with your health care provider.  Why do you want to quit?  If you tried to quit in the past, what helped and what did not?  What will be the most difficult situations for you after you quit? How will you plan to handle them?  Who can help you through the tough times? Your family? Friends? A health care provider?  What pleasures do you get from smoking? What ways can you still get pleasure if you quit? Here are some questions to ask your health care provider:  How can you help me to  be successful at quitting?  What medicine do you think would be best for me and how should I take it?  What should I do if I need more help?  What is smoking withdrawal like? How can I get information on withdrawal? GET READY  Set a quit date.  Change your environment by getting rid of all cigarettes, ashtrays, matches, and lighters in your home, car, or work. Do not let people smoke in your home.  Review your past attempts to quit. Think about what worked and what did not. GET SUPPORT AND ENCOURAGEMENT You have a better chance of being successful if you have help. You can get support in many ways.  Tell your family, friends, and coworkers that you are going to quit and need their support. Ask them not to smoke around you.  Get individual, group, or telephone counseling and support. Programs are available at [employer] and health centers. Call your local health department for information about programs in your area.  Spiritual beliefs and practices may help some smokers quit.  Download a "quit meter" on your computer to keep track of quit statistics, such as how long you have gone without smoking, cigarettes not smoked, and money saved.  Get a self-help book about quitting smoking and staying off tobacco. Morrison yourself from urges to smoke. Talk to someone, go for a walk, or occupy your time with a task.  Change your normal routine. Take a different route to work. Drink tea instead of coffee. Eat breakfast in a different place.  Reduce your stress. Take a hot bath, exercise, or read a book.  Plan something enjoyable to do every day. Reward yourself for not smoking.  Explore interactive web-based programs that specialize in helping you quit. GET MEDICINE AND USE IT CORRECTLY Medicines can help you stop smoking and decrease the urge to smoke. Combining medicine with the above behavioral methods and support can greatly increase your chances  of successfully quitting smoking.  Nicotine replacement therapy helps deliver nicotine to your body without the negative effects and risks of smoking. Nicotine replacement therapy includes nicotine gum, lozenges, inhalers, nasal sprays, and skin patches. Some may be available over-the-counter and others require a prescription.  Antidepressant medicine helps people abstain from smoking, but how this works is unknown. This medicine is available by prescription.  Nicotinic receptor partial agonist medicine simulates the effect of nicotine in your brain. This medicine is available by prescription. Ask your health care provider for advice about which medicines to use and how to use them based on your health history. Your health care provider will tell you what side effects to look out for if you choose to be on a medicine or therapy. Carefully read the information on the package. Do not use any  other product containing nicotine while using a nicotine replacement product.  RELAPSE OR DIFFICULT SITUATIONS Most relapses occur within the first 3 months after quitting. Do not be discouraged if you start smoking again. Remember, most people try several times before finally quitting. You may have symptoms of withdrawal because your body is used to nicotine. You may crave cigarettes, be irritable, feel very hungry, cough often, get headaches, or have difficulty concentrating. The withdrawal symptoms are only temporary. They are strongest when you first quit, but they will go away within 10-14 days. To reduce the chances of relapse, try to:  Avoid drinking alcohol. Drinking lowers your chances of successfully quitting.  Reduce the amount of caffeine you consume. Once you quit smoking, the amount of caffeine in your body increases and can give you symptoms, such as a rapid heartbeat, sweating, and anxiety.  Avoid smokers because they can make you want to smoke.  Do not let weight gain distract you. Many smokers  will gain weight when they quit, usually less than 10 pounds. Eat a healthy diet and stay active. You can always lose the weight gained after you quit.  Find ways to improve your mood other than smoking. FOR MORE INFORMATION  www.smokefree.gov  Document Released: 09/27/2001 Document Revised: 02/17/2014 Document Reviewed: 01/12/2012 [facility] Patient Information 2015 [place], [place]. This information is not intended to replace advice given to you by your health care provider. Make sure you discuss any questions you have with your health care provider.

## 2014-10-23 NOTE — Progress Notes (Signed)
Lynn Kline 1956/08/10 [id number]   History:    59 y.o.  for annual gyn exam who is a new patient to the practice. Her PCP is Dr. Scarlette Calico. Who has been doing her blood work. Patient has not seen a gynecologist in over 20 years. Past history of total abdominal hysterectomy without BSO for benign diagnosis. Patient has never been on any hormone replacement therapy. Patient denied any prior history of any abnormal Pap smear. Patient had a colonoscopy in 2013 has had history of benign polyps removed in the past. Patient denies any vasomotor symptoms. Patient not sexually active. Flu vaccine up-to-date. Pneumovax and TDap are up-to-date. Last mammogram 2013. Patient is a smoker whereby she used to smoke one pack cigarette per day and states that she is down to 5 cigarettes per day now.  Past medical history,surgical history, family history and social history were all reviewed and documented in the EPIC chart.  Gynecologic History No LMP recorded. Patient has had a hysterectomy. Contraception: status post hysterectomy Last Pap: Greater than 20 years ago. Results were: normal Last mammogram: 2013. Results were: normal  Obstetric History OB History  Gravida Para Term Preterm AB SAB TAB Ectopic Multiple Living  1 1        1     # Outcome Date GA Lbr Len/2nd Weight Sex Delivery Anes PTL Lv  1 Para                ROS: A ROS was performed and pertinent positives and negatives are included in the history.  GENERAL: No fevers or chills. HEENT: No change in vision, no earache, sore throat or sinus congestion. NECK: No pain or stiffness. CARDIOVASCULAR: No chest pain or pressure. No palpitations. PULMONARY: No shortness of breath, cough or wheeze. GASTROINTESTINAL: No abdominal pain, nausea, vomiting or diarrhea, melena or bright red blood per rectum. GENITOURINARY: No urinary frequency, urgency, hesitancy or dysuria. MUSCULOSKELETAL: No joint or muscle pain, no back pain, no recent trauma.  DERMATOLOGIC: No rash, no itching, no lesions. ENDOCRINE: No polyuria, polydipsia, no heat or cold intolerance. No recent change in weight. HEMATOLOGICAL: No anemia or easy bruising or bleeding. NEUROLOGIC: No headache, seizures, numbness, tingling or weakness. PSYCHIATRIC: No depression, no loss of interest in normal activity or change in sleep pattern.     Exam: chaperone present  BP 110/70 mmHg  Ht 5' 2.75" (1.594 m)  Wt 149 lb (67.586 kg)  BMI 26.60 kg/m2  Body mass index is 26.6 kg/(m^2).  General appearance : Well developed well nourished female. No acute distress HEENT: Neck supple, trachea midline, no carotid bruits, no thyroidmegaly Lungs: Clear to auscultation, no rhonchi or wheezes, or rib retractions  Heart: Regular rate and rhythm, no murmurs or gallops Breast:Examined in sitting and supine position were symmetrical in appearance, no palpable masses or tenderness,  no skin retraction, no nipple inversion, no nipple discharge, no skin discoloration, no axillary or supraclavicular lymphadenopathy Abdomen: no palpable masses or tenderness, no rebound or guarding Extremities: no edema or skin discoloration or tenderness  Pelvic:  Bartholin, Urethra, Skene Glands: Within normal limits             Vagina: No gross lesions or discharge  Cervix: Absent  Uterus  absent  Adnexa  Without masses or tenderness  Anus and perineum  normal   Rectovaginal  normal sphincter tone without palpated masses or tenderness             Hemoccult will provide  Assessment/Plan:  59 y.o. female for annual exam postmenopausal on no hormone replacement therapy no prior usage of HRT. Chronic smoker of many years was smoking 1 pack cigarette per month down to 5 cigarettes per day. Patient was offered Chantix and counseling. Patient states that she is working on it on her own. We discussed importance of calcium and vitamin D and regular exercise for ostia prevention. Next year we will get a baseline  bone density study. She was reminded to schedule her overdue mammogram. She was reminded to submit to the office the fecal Hemoccult cards for testing. Her urinalysis today was negative.   Terrance Mass MD, 12:10 PM 10/23/2014

## 2014-10-24 LAB — CYTOLOGY - PAP

## 2014-10-30 ENCOUNTER — Emergency Department (HOSPITAL_COMMUNITY)
Admission: EM | Admit: 2014-10-30 | Discharge: 2014-10-30 | Disposition: A | Discharge status: Home / Self Care | Payer: BLUE CROSS/BLUE SHIELD | Attending: Emergency Medicine | Admitting: Emergency Medicine

## 2014-10-30 ENCOUNTER — Encounter (HOSPITAL_COMMUNITY): Payer: Self-pay | Admitting: Emergency Medicine

## 2014-10-30 ENCOUNTER — Telehealth: Payer: Self-pay | Admitting: Internal Medicine

## 2014-10-30 ENCOUNTER — Telehealth: Payer: Self-pay | Admitting: *Deleted

## 2014-10-30 ENCOUNTER — Emergency Department (HOSPITAL_COMMUNITY): Payer: BLUE CROSS/BLUE SHIELD

## 2014-10-30 DIAGNOSIS — Z8679 Personal history of other diseases of the circulatory system: Secondary | ICD-10-CM | POA: Diagnosis not present

## 2014-10-30 DIAGNOSIS — K8020 Calculus of gallbladder without cholecystitis without obstruction: Secondary | ICD-10-CM | POA: Diagnosis not present

## 2014-10-30 DIAGNOSIS — Z86010 Personal history of colonic polyps: Secondary | ICD-10-CM | POA: Insufficient documentation

## 2014-10-30 DIAGNOSIS — R1084 Generalized abdominal pain: Secondary | ICD-10-CM | POA: Diagnosis present

## 2014-10-30 DIAGNOSIS — Z90710 Acquired absence of both cervix and uterus: Secondary | ICD-10-CM | POA: Diagnosis not present

## 2014-10-30 DIAGNOSIS — R109 Unspecified abdominal pain: Secondary | ICD-10-CM

## 2014-10-30 DIAGNOSIS — J441 Chronic obstructive pulmonary disease with (acute) exacerbation: Secondary | ICD-10-CM | POA: Diagnosis not present

## 2014-10-30 DIAGNOSIS — Z720 Tobacco use: Secondary | ICD-10-CM | POA: Insufficient documentation

## 2014-10-30 DIAGNOSIS — Z79899 Other long term (current) drug therapy: Secondary | ICD-10-CM | POA: Insufficient documentation

## 2014-10-30 DIAGNOSIS — F419 Anxiety disorder, unspecified: Secondary | ICD-10-CM | POA: Diagnosis not present

## 2014-10-30 LAB — COMPREHENSIVE METABOLIC PANEL
ALBUMIN: 4.4 g/dL (ref 3.5–5.2)
ALK PHOS: 85 U/L (ref 39–117)
ALT: 28 U/L (ref 0–35)
ANION GAP: 7 (ref 5–15)
AST: 25 U/L (ref 0–37)
BUN: 14 mg/dL (ref 6–23)
CO2: 27 mmol/L (ref 19–32)
Calcium: 9.5 mg/dL (ref 8.4–10.5)
Chloride: 106 mEq/L (ref 96–112)
Creatinine, Ser: 0.66 mg/dL (ref 0.50–1.10)
GFR calc Af Amer: >90 mL/min (ref >90)
GFR calc non Af Amer: >90 mL/min (ref >90)
Glucose, Bld: 108 mg/dL — ABNORMAL HIGH (ref 70–99)
Potassium: 3.8 mmol/L (ref 3.5–5.1)
SODIUM: 140 mmol/L (ref 135–145)
TOTAL PROTEIN: 7.5 g/dL (ref 6.0–8.3)
Total Bilirubin: 0.6 mg/dL (ref 0.3–1.2)

## 2014-10-30 LAB — CBC WITH DIFFERENTIAL/PLATELET
BASOS ABS: 0 10*3/uL (ref 0.0–0.1)
Basophils Relative: 0 % (ref 0–1)
EOS ABS: 0.1 10*3/uL (ref 0.0–0.7)
Eosinophils Relative: 1 % (ref 0–5)
HCT: 43 % (ref 36.0–46.0)
Hemoglobin: 14.7 g/dL (ref 12.0–15.0)
Lymphocytes Relative: 31 % (ref 12–46)
Lymphs Abs: 3.2 10*3/uL (ref 0.7–4.0)
MCH: 31.4 pg (ref 26.0–34.0)
MCHC: 34.2 g/dL (ref 30.0–36.0)
MCV: 91.9 fL (ref 78.0–100.0)
Monocytes Absolute: 0.5 10*3/uL (ref 0.1–1.0)
Monocytes Relative: 4 % (ref 3–12)
NEUTROS ABS: 6.4 10*3/uL (ref 1.7–7.7)
Neutrophils Relative %: 64 % (ref 43–77)
Platelets: 337 10*3/uL (ref 150–400)
RBC: 4.68 MIL/uL (ref 3.87–5.11)
RDW: 12.9 % (ref 11.5–15.5)
WBC: 10.2 10*3/uL (ref 4.0–10.5)

## 2014-10-30 LAB — URINE MICROSCOPIC-ADD ON

## 2014-10-30 LAB — URINALYSIS, ROUTINE W REFLEX MICROSCOPIC
BILIRUBIN URINE: NEGATIVE
Glucose, UA: NEGATIVE mg/dL
KETONES UR: NEGATIVE mg/dL
LEUKOCYTES UA: NEGATIVE
NITRITE: NEGATIVE
PH: 5 (ref 5.0–8.0)
Protein, ur: NEGATIVE mg/dL
Specific Gravity, Urine: 1.015 (ref 1.005–1.030)
UROBILINOGEN UA: 0.2 mg/dL (ref 0.0–1.0)

## 2014-10-30 LAB — LIPASE, BLOOD: Lipase: 27 U/L (ref 11–59)

## 2014-10-30 MED ORDER — MORPHINE SULFATE 4 MG/ML IJ SOLN
4.0000 mg | Freq: Once | INTRAMUSCULAR | Status: AC
  Administered 2014-10-30: 4 mg via INTRAVENOUS
  Filled 2014-10-30: qty 1

## 2014-10-30 MED ORDER — ONDANSETRON HCL 4 MG/2ML IJ SOLN
4.0000 mg | Freq: Once | INTRAMUSCULAR | Status: AC
  Administered 2014-10-30: 4 mg via INTRAVENOUS
  Filled 2014-10-30: qty 2

## 2014-10-30 MED ORDER — OXYCODONE-ACETAMINOPHEN 5-325 MG PO TABS: 1.0000 | ORAL_TABLET | Freq: Four times a day (QID) | ORAL | Status: DC | PRN

## 2014-10-30 MED ORDER — PROMETHAZINE HCL 25 MG PO TABS: 25.0000 mg | ORAL_TABLET | Freq: Three times a day (TID) | ORAL | Status: DC | PRN

## 2014-10-30 MED ORDER — PROMETHAZINE HCL 25 MG/ML IJ SOLN
12.5000 mg | Freq: Once | INTRAMUSCULAR | Status: AC
  Administered 2014-10-30: 12.5 mg via INTRAVENOUS
  Filled 2014-10-30 (×2): qty 1

## 2014-10-30 MED ORDER — SODIUM CHLORIDE 0.9 % IV BOLUS (SEPSIS)
1000.0000 mL | Freq: Once | INTRAVENOUS | Status: AC
Start: 2014-10-30 — End: 2014-10-30
  Administered 2014-10-30: 1000 mL via INTRAVENOUS

## 2014-10-30 MED ORDER — SODIUM CHLORIDE 0.9 % IV BOLUS (SEPSIS)
500.0000 mL | Freq: Once | INTRAVENOUS | Status: AC
  Administered 2014-10-30: 500 mL via INTRAVENOUS

## 2014-10-30 MED ORDER — HYDROMORPHONE HCL 1 MG/ML IJ SOLN
1.0000 mg | Freq: Once | INTRAMUSCULAR | Status: AC
  Administered 2014-10-30: 1 mg via INTRAVENOUS
  Filled 2014-10-30: qty 1

## 2014-10-30 MED ORDER — GI COCKTAIL ~~LOC~~
30.0000 mL | Freq: Once | ORAL | Status: AC
  Administered 2014-10-30: 30 mL via ORAL
  Filled 2014-10-30: qty 30

## 2014-10-30 NOTE — Telephone Encounter (Signed)
[facility] Patient Name: Lynn Kline Gender: Female DOB: 1956-02-20 Age: 59 Y 48 M 20 D Return Phone Number: [phone] (Primary) Address: City/State/Zip: [place] Client [place] [facility] - Client Client Site [place] - Day Physician [place], [place] Type Call Call Type Triage / Clinical Caller Name Lind Relationship To Patient Self Appointment Disposition EMR Appointment Not Necessary Return Phone Number [phone] (Primary) Chief Complaint Abdominal Pain Initial Comment Caller states she has a gallstone, and having pain. Wants pain medication. PreDisposition Call Doctor Nurse Assessment Nurse: Rock Nephew, RN, Juliann Pulse Date/Time (Eastern Time): 10/30/2014 10:00:40 AM Confirm and document reason for call. If symptomatic, describe symptoms. ---Caller states she has a gallstone, and is having abdominal pain. Wants pain medication called in. Has the patient traveled out of the country within the last 30 days? ---Not Applicable Does the patient require triage? ---Yes Related visit to physician within the last 2 weeks? ---No was diagnosed with Gallstones but unable to have surgery because she cannot afford the surgeon's fees Does the PT have any chronic conditions? (i.e. diabetes, asthma, etc.) ---No Guidelines Guideline Title Affirmed Question Affirmed Notes Nurse Date/Time Eilene Ghazi Time) Abdominal Pain - Upper [1] SEVERE pain (e.g., excruciating) AND [2] present > 1 hour Wells, RN, Juliann Pulse 10/30/2014 10:01:41 AM Disp. Time Eilene Ghazi Time) Disposition Final User 10/30/2014 10:02:34 AM Go to ED Now Yes Rock Nephew, RN, Gara Kroner Understands: Yes Disagree/Comply: Comply PLEASE NOTE: All timestamps contained within this report are represented as [place] Standard Time. CONFIDENTIALTY NOTICE: This fax transmission is intended only for the addressee. It contains information that  is legally privileged, confidential or otherwise protected from use or disclosure. If you are not the intended recipient, you are strictly prohibited from reviewing, disclosing, copying using or disseminating any of this information or taking any action in reliance on or regarding this information. If you have received this fax in error, please notify us immediately by telephone so that we can arrange for its return to [place]. Phone: [phone], Toll-Free: [phone], Fax: [phone] Page: 2 of 2 Call Id: [id number] Care Advice Given Per Guideline GO TO ED NOW: You need to be seen in the Emergency Department. Go to the ER at ___________ [place] now. Drive carefully. DRIVING: Another adult should drive. Do not delay going to the Emergency Department. If immediate transportation is not available via car or taxi, then the patient should be instructed to call [id number]. NOTHING BY MOUTH: Do not eat or drink anything for now. (Reason: condition may need surgery and general anesthesia) CARE ADVICE given per Abdominal Pain, Upper (Adult) guideline. * Please bring a list of your current medicines when you go to the Emergency Department (ER). After Care Instructions Given Call Event Type User Date /

## 2014-10-30 NOTE — Telephone Encounter (Signed)
[facility]  Patient Name: Lynn Kline  DOB: [birth year]    Nurse Assessment  Nurse: Rock Nephew, RN, Juliann Pulse Date/Time (Eastern Time): 10/30/2014 10:00:40 AM  Confirm and document reason for call. If symptomatic, describe symptoms. ---Caller states she has a gallstone, and is having abdominal pain. Wants pain medication called in.  Has the patient traveled out of the country within the last 30 days? ---Not Applicable  Does the patient require triage? ---Yes  Related visit to physician within the last 2 weeks? ---No   was diagnosed with Gallstones but unable to have surgery because she cannot afford the surgeon's fees  Does the PT have any chronic conditions? (i.e. diabetes, asthma, etc.) ---No     Guidelines    Guideline Title Affirmed Question Affirmed Notes  Abdominal Pain - Upper [1] SEVERE pain (e.g., excruciating) AND [2] present > 1 hour    Final Disposition User   Go to ED Now Rock Nephew, RN, Juliann Pulse

## 2014-10-30 NOTE — ED Notes (Signed)
Pt c/o mid abd pain that she relates to gallstones that she has already been seen for.  States she needs surgery but does not have the money.  Also c/o NVD.

## 2014-10-30 NOTE — Discharge Instructions (Signed)
Return here as needed.  Follow-up with the surgeon provided.  °

## 2014-10-30 NOTE — ED Provider Notes (Signed)
CSN: [id number]     Arrival date & time 10/30/14  1030 History   First MD Initiated Contact with Patient 10/30/14 1114     Chief Complaint  Patient presents with  . Abdominal Pain     (Consider location/radiation/quality/duration/timing/severity/associated sxs/prior Treatment) HPI Comments: The patient is a 59 year old female with known gallstones, tobacco abuse presenting to the emergency department chief complaint of abdominal discomfort for 2 days. Patient reports at the gastric and upper abdominal discomfort with radiation into the generalized abdomen and bilateral flanks. Patient reports initial onset of discomfort as intermittent, abdominal discomfort is constant now. Patient reports 2 episodes of nonbloody emesis today and 2 episodes of diarrhea today. Patient denies hematuria, dysuria, fever. Discomfort is not worsened with food, body position, urination, bowel movement.  The history is provided by the patient. No language interpreter was used.    Past Medical History  Diagnosis Date  . COPD (chronic obstructive pulmonary disease)   . Anxiety   . Gallstone   . Diverticulosis   . Atherosclerosis   . Colon polyp, hyperplastic    Past Surgical History  Procedure Laterality Date  . Abdominal hysterectomy     Family History  Problem Relation Age of Onset  . Arthritis Mother   . COPD Mother   . Emphysema Mother   . Stroke Father   . Heart disease Father   . Cancer Father     MELANOMA  . Colon cancer Neg Hx   . Breast cancer Maternal Grandmother    History  Substance Use Topics  . Smoking status: Current Every Day Smoker -- 0.50 packs/day for 35 years    Types: Cigarettes  . Smokeless tobacco: Never Used  . Alcohol Use: No   OB History    Gravida Para Term Preterm AB TAB SAB Ectopic Multiple Living   1 1        1      Review of Systems  Constitutional: Negative for fever and chills.  Gastrointestinal: Positive for nausea, vomiting, abdominal pain and diarrhea.  Negative for constipation and blood in stool.  Genitourinary: Positive for flank pain. Negative for dysuria and hematuria.      Allergies  Codeine; Erythromycin; and Ampicillin  Home Medications   Prior to Admission medications   Medication Sig Start Date End Date Taking? Authorizing Provider  ALPRAZolam (XANAX) 0.25 MG tablet Take 0.25 mg by mouth 3 (three) times daily as needed for anxiety.    Yes Historical Provider, MD  diphenhydramine-acetaminophen (TYLENOL PM) 25-500 MG TABS Take 0.5 tablets by mouth at bedtime as needed (for pain/sleep).   Yes Historical Provider, MD  ibuprofen (ADVIL,MOTRIN) 200 MG tablet Take 200 mg by mouth every 6 (six) hours as needed for headache or moderate pain.   Yes Historical Provider, MD  ondansetron (ZOFRAN ODT) 4 MG disintegrating tablet Take 1 tablet (4 mg total) by mouth every 8 (eight) hours as needed for nausea or vomiting. 08/07/14  Yes Heather Laisure, PA-C  ranitidine (ZANTAC) 150 MG tablet Take 150 mg by mouth daily.   Yes Historical Provider, MD  Umeclidinium-Vilanterol (ANORO ELLIPTA) 62.5-25 MCG/INH AEPB Inhale 1 puff into the lungs daily. 08/19/14  Yes Janith Lima, MD  Wheat Dextrin [facility]) POWD Take 1 tablespoon by mouth in water or juice once daily Patient taking differently: Take 1 tablespoon by mouth in water or juice once daily as needed for constipation 08/12/14  Yes Lori P Hvozdovic, PA-C  dicyclomine (BENTYL) 10 MG capsule Take 1 capsule (10 mg  total) by mouth 3 (three) times daily as needed for spasms. Patient not taking: Reported on 10/30/2014 08/12/14   Lori P Hvozdovic, PA-C  pantoprazole (PROTONIX) 40 MG tablet Take 1 tablet by mouth daily 30 minutes before breakfast Patient not taking: Reported on 10/23/2014 08/12/14   Lori P Hvozdovic, PA-C   BP 158/85 mmHg  Pulse 87  Temp(Src) 98 F (36.7 C) (Oral)  Resp 16  SpO2 100% Physical Exam  Constitutional: She is oriented to person, place, and time. She appears  well-developed and well-nourished. No distress.  HENT:  Head: Normocephalic and atraumatic.  Neck: Neck supple.  Cardiovascular: Normal rate and regular rhythm.   Pulmonary/Chest: Effort normal. No respiratory distress. She has wheezes.  Mild expiratory wheezing. Prolonged expiratory phase.  Abdominal: Soft. She exhibits no distension and no mass. There is tenderness in the right upper quadrant. There is guarding and positive Murphy's sign. There is no rebound and no CVA tenderness.  Moderate RUQ, Epigastric tenderness. Mild generalized abdominal pain.  Neurological: She is alert and oriented to person, place, and time.  Skin: Skin is warm and dry. She is not diaphoretic.  Psychiatric: She has a normal mood and affect. Her behavior is normal.  Nursing note and vitals reviewed.   ED Course  Procedures (including critical care time) Labs Review Labs Reviewed  COMPREHENSIVE METABOLIC PANEL - Abnormal; Notable for the following:    Glucose, Bld 108 (*)    All other components within normal limits  URINALYSIS, ROUTINE W REFLEX MICROSCOPIC - Abnormal; Notable for the following:    Hgb urine dipstick TRACE (*)    All other components within normal limits  CBC WITH DIFFERENTIAL  LIPASE, BLOOD  URINE MICROSCOPIC-ADD ON    Imaging Review US Abdomen Complete  10/30/2014   CLINICAL DATA:  Right upper quadrant pain and nausea for 1 day. Vomiting.  EXAM: ULTRASOUND ABDOMEN COMPLETE  COMPARISON:  CT abdomen and pelvis 08/07/2014.  FINDINGS: Gallbladder: A 2.2 cm gallstone is identified and the gallbladder is filled with sludge. No wall thickening or pericholecystic fluid is identified. Sonographer reports negative Murphy's sign.  Common bile duct: Diameter: 0.4 cm.  Liver: No focal lesion identified. Within normal limits in parenchymal echogenicity.  IVC: No abnormality visualized.  Pancreas: Visualized portion unremarkable.  Spleen: Size and appearance within normal limits.  Right Kidney: Length:  9.5 cm. Echogenicity within normal limits. No mass or hydronephrosis visualized.  Left Kidney: Length: 9.9 cm. Echogenicity within normal limits. No mass or hydronephrosis visualized.  Abdominal aorta: No aneurysm visualized.  Other findings: None.  IMPRESSION: A 2.2 cm gallstone is seen and the gallbladder is filled with sludge but there is no evidence of acute cholecystitis. The study is otherwise negative.   Electronically Signed   By: Inge Rise M.D.   On: 10/30/2014 13:37     EKG Interpretation None      MDM   Final diagnoses:  Abdominal pain  Gall stone   Patient presents with right upper quadrant pain known gallbladder stones plan to ultrasound, labs ordered. Labs without concerning abnormality patient reports no relief after 2 rounds of morphine, pain medication given. Patient also reports persistent nausea without emesis. Revaluation patient denies abdominal pain reports nausea and warm feeling after GI cocktail. Reevaluation patient reports drowsiness after nausea medication. Plan to give fluids with low blood pressure and have Lawyer, PA-C reevaluate the patient after fluid bolus. Likely discharge home if asymptomatic with pain medication and return to [facility].  Harvie Heck, PA-C 10/30/14 1647  Wandra Arthurs, MD 10/31/14 [phone]

## 2014-10-30 NOTE — ED Notes (Signed)
US at bedside

## 2014-11-04 ENCOUNTER — Telehealth: Payer: Self-pay | Admitting: Internal Medicine

## 2014-11-04 NOTE — Telephone Encounter (Signed)
Patient states she went to ED on last Thursday with abdominal pain due to gallbladder. She has seen a Psychologist, sport and exercise but states she has not met her deductible and does not have the money to set up surgery. She is asking if Dr. Olevia Perches will give her pain medication for the abdominal pain. Please, advise.

## 2014-11-04 NOTE — Telephone Encounter (Signed)
It is not a good idea to wait with surgery, I referred her to Dr Marlou Starks in November 2015 and she apparently did not go. Now she is having symptoms which are likely to continue and she may end up with pancreatitis.Will not be able to prescribe  Any narcotics, but she can take Ibuprofen

## 2014-11-04 NOTE — Telephone Encounter (Signed)
Spoke with patient and gave her recommendation.

## 2014-11-05 ENCOUNTER — Telehealth: Payer: Self-pay | Admitting: Internal Medicine

## 2014-11-05 ENCOUNTER — Ambulatory Visit (INDEPENDENT_AMBULATORY_CARE_PROVIDER_SITE_OTHER): Payer: BLUE CROSS/BLUE SHIELD | Admitting: Internal Medicine

## 2014-11-05 VITALS — HR 94 | Temp 98.0°F | Resp 20 | Ht 62.0 in | Wt 147.0 lb

## 2014-11-05 DIAGNOSIS — K8020 Calculus of gallbladder without cholecystitis without obstruction: Secondary | ICD-10-CM

## 2014-11-05 MED ORDER — OXYCODONE-ACETAMINOPHEN 5-325 MG PO TABS: 1.0000 | ORAL_TABLET | Freq: Four times a day (QID) | ORAL | Status: DC | PRN

## 2014-11-05 NOTE — Telephone Encounter (Signed)
There is only one surgical group in [place]. She may try [facility] or [place] don't know any Agricultural engineer.

## 2014-11-05 NOTE — Patient Instructions (Addendum)
We will refill the pain medicine and look into if any other local surgery centers will be better about working with you and getting the gallbladder out.   Please call these offices and talk to them about their flexibility with payment plans for gallbladder surgery.  [facility]: [id number]  [phone]  [facility]: [phone] [phone] [phone]

## 2014-11-05 NOTE — Telephone Encounter (Signed)
Patient cannot afford the fee with [facility] for her surgery. She is asking if there is anywhere else she can be referred. Please, advise.

## 2014-11-05 NOTE — Progress Notes (Signed)
Pre visit review using our clinic review tool, if applicable. No additional management support is needed unless otherwise documented below in the visit note. 

## 2014-11-06 ENCOUNTER — Encounter: Payer: Self-pay | Admitting: Internal Medicine

## 2014-11-06 DIAGNOSIS — K8020 Calculus of gallbladder without cholecystitis without obstruction: Secondary | ICD-10-CM | POA: Insufficient documentation

## 2014-11-06 NOTE — Progress Notes (Signed)
   Subjective:    Patient ID: Lynn Kline, female    DOB: Jun 13, 1956, 59 y.o.   MRN: [id number]  HPI The patient is a 59 YO female who is coming in to follow up on an ER visit for gall stones. She is taking her pain medicine sparingly and is scared to eat or drink anything for the pain. She has been struggling with this off and on since October and has been unable to work out a Agricultural consultant with surgeons in town. She has a $10,000 deductible and would have to pay out of pocket up front for the surgery which she cannot afford.   Review of Systems  Constitutional: Positive for appetite change. Negative for fever, activity change, fatigue and unexpected weight change.  Respiratory: Negative for cough, chest tightness and shortness of breath.   Cardiovascular: Negative for chest pain, palpitations and leg swelling.  Gastrointestinal: Positive for nausea and abdominal pain. Negative for diarrhea, constipation and abdominal distention.  Neurological: Negative.   Psychiatric/Behavioral: Positive for dysphoric mood.      Objective:   Physical Exam  Constitutional: She is oriented to person, place, and time. She appears well-developed and well-nourished.  In mild distress  HENT:  Head: Normocephalic and atraumatic.  Eyes: EOM are normal.  Neck: Normal range of motion.  Cardiovascular: Normal rate and regular rhythm.   Pulmonary/Chest: Effort normal and breath sounds normal. No respiratory distress. She has no wheezes.  Abdominal: Soft. Bowel sounds are normal. She exhibits no distension. There is tenderness. There is no rebound and no guarding.  Neurological: She is alert and oriented to person, place, and time. Coordination normal.  Skin: Skin is warm and dry.   Filed Vitals:   11/05/14 1054  Pulse: 94  Temp: 98 F (36.7 C)  TempSrc: Oral  Resp: 20  Height: 5\' 2"  (1.575 m)  Weight: 147 lb (66.679 kg)  SpO2: 96%      Assessment & Plan:

## 2014-11-06 NOTE — Assessment & Plan Note (Signed)
No obstruction on [place] 2 days ago and no change in symptoms. She does have a 2.2 cm stone and sludge. Have given her numbers for [facility] and [facility] as [facility] in town is not willing to work with her on a payment plan. Refill of vicodin given so that she can return to work.

## 2014-11-06 NOTE — Telephone Encounter (Signed)
Spoke with patient and her PCP sent her to a Psychologist, sport and exercise in [place].

## 2015-01-06 ENCOUNTER — Other Ambulatory Visit: Payer: Self-pay | Admitting: Internal Medicine

## 2016-11-22 ENCOUNTER — Encounter (HOSPITAL_COMMUNITY): Payer: Self-pay | Admitting: Emergency Medicine

## 2016-11-22 ENCOUNTER — Emergency Department (HOSPITAL_COMMUNITY): Payer: 59

## 2016-11-22 ENCOUNTER — Emergency Department (HOSPITAL_COMMUNITY)
Admission: EM | Admit: 2016-11-22 | Discharge: 2016-11-22 | Disposition: A | Discharge status: Home / Self Care | Payer: 59 | Attending: Emergency Medicine | Admitting: Emergency Medicine

## 2016-11-22 DIAGNOSIS — Z79899 Other long term (current) drug therapy: Secondary | ICD-10-CM | POA: Diagnosis not present

## 2016-11-22 DIAGNOSIS — F17210 Nicotine dependence, cigarettes, uncomplicated: Secondary | ICD-10-CM | POA: Insufficient documentation

## 2016-11-22 DIAGNOSIS — J441 Chronic obstructive pulmonary disease with (acute) exacerbation: Secondary | ICD-10-CM | POA: Diagnosis not present

## 2016-11-22 DIAGNOSIS — R0602 Shortness of breath: Secondary | ICD-10-CM | POA: Diagnosis present

## 2016-11-22 LAB — CBC WITH DIFFERENTIAL/PLATELET
Basophils Absolute: 0.1 10*3/uL (ref 0.0–0.1)
Basophils Relative: 1 %
EOS PCT: 3 %
Eosinophils Absolute: 0.2 10*3/uL (ref 0.0–0.7)
HCT: 39.9 % (ref 36.0–46.0)
HEMOGLOBIN: 13.5 g/dL (ref 12.0–15.0)
LYMPHS PCT: 27 %
Lymphs Abs: 2.3 10*3/uL (ref 0.7–4.0)
MCH: 30.8 pg (ref 26.0–34.0)
MCHC: 33.8 g/dL (ref 30.0–36.0)
MCV: 91.1 fL (ref 78.0–100.0)
Monocytes Absolute: 0.5 10*3/uL (ref 0.1–1.0)
Monocytes Relative: 6 %
NEUTROS PCT: 63 %
Neutro Abs: 5.5 10*3/uL (ref 1.7–7.7)
Platelets: 356 10*3/uL (ref 150–400)
RBC: 4.38 MIL/uL (ref 3.87–5.11)
RDW: 13.1 % (ref 11.5–15.5)
WBC: 8.6 10*3/uL (ref 4.0–10.5)

## 2016-11-22 LAB — COMPREHENSIVE METABOLIC PANEL
ALT: 24 U/L (ref 14–54)
AST: 21 U/L (ref 15–41)
Albumin: 4.1 g/dL (ref 3.5–5.0)
Alkaline Phosphatase: 83 U/L (ref 38–126)
Anion gap: 8 (ref 5–15)
BILIRUBIN TOTAL: 0.3 mg/dL (ref 0.3–1.2)
BUN: 14 mg/dL (ref 6–20)
CO2: 30 mmol/L (ref 22–32)
Calcium: 8.9 mg/dL (ref 8.9–10.3)
Chloride: 102 mmol/L (ref 101–111)
Creatinine, Ser: 0.69 mg/dL (ref 0.44–1.00)
GFR calc Af Amer: >60 mL/min (ref >60)
GLUCOSE: 101 mg/dL — AB (ref 65–99)
Potassium: 3.9 mmol/L (ref 3.5–5.1)
Sodium: 140 mmol/L (ref 135–145)
TOTAL PROTEIN: 6.5 g/dL (ref 6.5–8.1)

## 2016-11-22 LAB — BRAIN NATRIURETIC PEPTIDE: B Natriuretic Peptide: 34.1 pg/mL (ref 0.0–100.0)

## 2016-11-22 LAB — D-DIMER, QUANTITATIVE (NOT AT ARMC)

## 2016-11-22 LAB — LIPASE, BLOOD: Lipase: 21 U/L (ref 11–51)

## 2016-11-22 LAB — URINALYSIS, ROUTINE W REFLEX MICROSCOPIC
Bilirubin Urine: NEGATIVE
Glucose, UA: NEGATIVE mg/dL
Hgb urine dipstick: NEGATIVE
Ketones, ur: NEGATIVE mg/dL
LEUKOCYTES UA: NEGATIVE
Nitrite: NEGATIVE
Protein, ur: NEGATIVE mg/dL
SPECIFIC GRAVITY, URINE: 1.005 (ref 1.005–1.030)
pH: 6 (ref 5.0–8.0)

## 2016-11-22 LAB — TROPONIN I: Troponin I: <0.03 ng/mL (ref <0.03)

## 2016-11-22 MED ORDER — KETOROLAC TROMETHAMINE 30 MG/ML IJ SOLN
15.0000 mg | Freq: Once | INTRAMUSCULAR | Status: AC
  Administered 2016-11-22: 15 mg via INTRAVENOUS
  Filled 2016-11-22: qty 1

## 2016-11-22 MED ORDER — METHYLPREDNISOLONE SODIUM SUCC 125 MG IJ SOLR
125.0000 mg | Freq: Once | INTRAMUSCULAR | Status: AC
  Administered 2016-11-22: 125 mg via INTRAVENOUS
  Filled 2016-11-22: qty 2

## 2016-11-22 MED ORDER — ALBUTEROL SULFATE (2.5 MG/3ML) 0.083% IN NEBU
5.0000 mg | INHALATION_SOLUTION | Freq: Once | RESPIRATORY_TRACT | Status: AC
  Administered 2016-11-22: 5 mg via RESPIRATORY_TRACT
  Filled 2016-11-22: qty 6

## 2016-11-22 MED ORDER — ALBUTEROL SULFATE HFA 108 (90 BASE) MCG/ACT IN AERS
2.0000 | INHALATION_SPRAY | Freq: Four times a day (QID) | RESPIRATORY_TRACT | Status: DC
  Administered 2016-11-22: 2 via RESPIRATORY_TRACT
  Filled 2016-11-22: qty 6.7

## 2016-11-22 MED ORDER — PREDNISONE 20 MG PO TABS: 40.0000 mg | ORAL_TABLET | Freq: Every day | ORAL | 0 refills | Status: DC

## 2016-11-22 NOTE — ED Triage Notes (Signed)
Patient reports shortness of breath and pain between shoulder blade x2 days. Reports productive cough (white sputum). Patient states "it hasnt hurt like this since the last time I had pneumonia."

## 2016-11-22 NOTE — ED Provider Notes (Signed)
[facility] DEPT Provider Note   CSN: [id number] Arrival date & time: 11/22/16  0740     History   Chief Complaint Chief Complaint  Patient presents with  . Shortness of Breath    HPI Lynn Kline is a 61 y.o. female.  HPI  The patient presents with concern of dyspnea, back pain. Onset was sudden, approximately 30 hours ago. Since onset the pain has been persistent, has mild radiation towards the left anterior chest, left arm and left jaw There is persistent dyspnea, beyond baseline. Symptoms are worse with activity, motion. There is associated cough, but no syncope, no fever. Patient acknowledges history of COPD, continues to smoke cigarettes. She states that the symptoms are similar to when she had pneumonia.   Smoking cessation provided, particularly in light of this patient's evaluation in the ED.   Past Medical History:  Diagnosis Date  . Anxiety   . Atherosclerosis   . Colon polyp, hyperplastic   . COPD (chronic obstructive pulmonary disease) ([place])   . Diverticulosis   . Gallstone     Patient Active Problem List   Diagnosis Date Noted  . Gallstones 11/06/2014  . Hyperlipidemia with target LDL less than 130 08/19/2014  . Cervical cancer screening 08/19/2014  . Dysphagia, pharyngoesophageal phase 08/12/2014  . Dyspepsia 08/12/2014  . IBS (irritable bowel syndrome) 08/12/2014  . Visit for screening mammogram 12/14/2011  . Routine general medical examination at a health care facility 12/14/2011  . Hyperglycemia 11/15/2011  . COPD (chronic obstructive pulmonary disease) ([place]) 11/15/2011  . Palpitations 11/15/2011  . Tobacco abuse 11/15/2011  . Anxiety 11/15/2011    Past Surgical History:  Procedure Laterality Date  . ABDOMINAL HYSTERECTOMY      OB History    Gravida Para Term Preterm AB Living   1 1       1    SAB TAB Ectopic Multiple Live Births                   Home Medications    Prior to Admission medications   Medication Sig  Start Date End Date Taking? Authorizing Provider  ALPRAZolam (XANAX) 0.25 MG tablet Take 0.25 mg by mouth 3 (three) times daily as needed for anxiety.     Historical Provider, MD  dicyclomine (BENTYL) 10 MG capsule Take 1 capsule (10 mg total) by mouth 3 (three) times daily as needed for spasms. Patient not taking: Reported on 10/30/2014 08/12/14   Lori P Hvozdovic, PA-C  diphenhydramine-acetaminophen (TYLENOL PM) 25-500 MG TABS Take 0.5 tablets by mouth at bedtime as needed (for pain/sleep).    Historical Provider, MD  ibuprofen (ADVIL,MOTRIN) 200 MG tablet Take 200 mg by mouth every 6 (six) hours as needed for headache or moderate pain.    Historical Provider, MD  ondansetron (ZOFRAN ODT) 4 MG disintegrating tablet Take 1 tablet (4 mg total) by mouth every 8 (eight) hours as needed for nausea or vomiting. 08/07/14   Hyman Bible, PA-C  oxyCODONE-acetaminophen (PERCOCET/ROXICET) 5-325 MG per tablet Take 1 tablet by mouth every 6 (six) hours as needed for severe pain. 11/05/14   Hoyt Koch, MD  pantoprazole (PROTONIX) 40 MG tablet Take 1 tablet by mouth daily 30 minutes before breakfast Patient not taking: Reported on 10/23/2014 08/12/14   Lori P Hvozdovic, PA-C  promethazine (PHENERGAN) 25 MG tablet Take 1 tablet (25 mg total) by mouth every 8 (eight) hours as needed for nausea or vomiting. 10/30/14   Dalia Heading, PA-C  ranitidine (ZANTAC) 150  MG tablet Take 150 mg by mouth daily.    Historical Provider, MD  Umeclidinium-Vilanterol (ANORO ELLIPTA) 62.5-25 MCG/INH AEPB Inhale 1 puff into the lungs daily. 08/19/14   Janith Lima, MD  Wheat Dextrin [facility]) POWD Take 1 tablespoon by mouth in water or juice once daily Patient not taking: Reported on 11/05/2014 08/12/14   Cecille Rubin P Hvozdovic, PA-C    Family History Family History  Problem Relation Age of Onset  . Arthritis Mother   . COPD Mother   . Emphysema Mother   . Stroke Father   . Heart disease Father   . Cancer Father      MELANOMA  . Breast cancer Maternal Grandmother   . Colon cancer Neg Hx     Social History Social History  Substance Use Topics  . Smoking status: Current Every Day Smoker    Packs/day: 0.50    Years: 35.00    Types: Cigarettes  . Smokeless tobacco: Never Used  . Alcohol use No     Allergies   Codeine; Erythromycin; and Ampicillin   Review of Systems Review of Systems  Constitutional:       Per HPI, otherwise negative  HENT:       Per HPI, otherwise negative  Respiratory:       Per HPI, otherwise negative  Cardiovascular:       Per HPI, otherwise negative  Gastrointestinal: Negative for vomiting.  Endocrine:       Negative aside from HPI  Genitourinary:       Neg aside from HPI   Musculoskeletal:       Per HPI, otherwise negative  Skin: Negative.   Neurological: Negative for syncope.     Physical Exam Updated Vital Signs BP 143/92 (BP Location: Left Arm)   Pulse 98   Temp 98.5 F (36.9 C) (Oral)   Resp 20   Ht 5\' 2"  (1.575 m)   Wt 147 lb (66.7 kg)   SpO2 100%   BMI 26.89 kg/m   Physical Exam  Constitutional: She is oriented to person, place, and time. She appears well-developed and well-nourished. No distress.  HENT:  Head: Normocephalic and atraumatic.  Eyes: Conjunctivae and EOM are normal.  Cardiovascular: Normal rate and regular rhythm.   Pulmonary/Chest: Effort normal. No stridor. No respiratory distress. She has decreased breath sounds. She has no wheezes.  Abdominal: She exhibits no distension.  Musculoskeletal: She exhibits no edema.  Neurological: She is alert and oriented to person, place, and time. No cranial nerve deficit.  Skin: Skin is warm and dry.  Psychiatric: She has a normal mood and affect.  Nursing note and vitals reviewed.    ED Treatments / Results  Labs (all labs ordered are listed, but only abnormal results are displayed) Labs Reviewed  COMPREHENSIVE METABOLIC PANEL - Abnormal; Notable for the following:       Result  Value   Glucose, Bld 101 (*)    All other components within normal limits  URINALYSIS, ROUTINE W REFLEX MICROSCOPIC - Abnormal; Notable for the following:    Color, Urine STRAW (*)    All other components within normal limits  BRAIN NATRIURETIC PEPTIDE  LIPASE, BLOOD  TROPONIN I  CBC WITH DIFFERENTIAL/PLATELET  D-DIMER, QUANTITATIVE (NOT AT [facility])    EKG  EKG Interpretation  Date/Time:  Tuesday November 22 2016 08:14:55 EST Ventricular Rate:  89 PR Interval:    QRS Duration: 79 QT Interval:  357 QTC Calculation: 432 R Axis:   78 Text  Interpretation:  Sinus rhythm Baseline wander T wave abnormality Abnormal ekg Confirmed by Carmin Muskrat  MD ([id number]) on 11/22/2016 8:17:59 AM        EKG Interpretation  Date/Time:  Tuesday November 22 2016 08:24:22 EST Ventricular Rate:  86 PR Interval:    QRS Duration: 80 QT Interval:  370 QTC Calculation: 443 R Axis:   76 Text Interpretation:  Sinus rhythm Artifact Otherwise within normal limits Confirmed by Carmin Muskrat  MD ([id number]) on 11/22/2016 9:30:53 AM        Radiology Dg Chest 2 View  Result Date: 11/22/2016 CLINICAL DATA:  Sharp upper back and anterior left chest discomfort for the past 2 days associated with productive cough. History of COPD, current smoker. EXAM: CHEST  2 VIEW COMPARISON:  PA and lateral chest x-ray of August 07, 2014 FINDINGS: The lungs are hyperinflated with hemidiaphragm flattening. There is no focal infiltrate. There is no pleural effusion, pneumothorax, or pneumomediastinum. The heart and pulmonary vascularity are normal. The mediastinum is normal in width. The bony thorax exhibits no acute abnormality. IMPRESSION: Hyperinflation consistent with COPD. There is no pneumonia, CHF, nor other acute cardiopulmonary abnormality. Electronically Signed   By: David  Martinique M.D.   On: 11/22/2016 08:08    Procedures Procedures (including critical care time)  Medications Ordered in ED Medications  ketorolac  (TORADOL) 30 MG/ML injection 15 mg (not administered)  albuterol (PROVENTIL) (2.5 MG/3ML) 0.083% nebulizer solution 5 mg (not administered)  methylPREDNISolone sodium succinate (SOLU-MEDROL) 125 mg/2 mL injection 125 mg (not administered)     Initial Impression / Assessment and Plan / ED Course  I have reviewed the triage vital signs and the nursing notes.  Pertinent labs & imaging results that were available during my care of the patient were reviewed by me and considered in my medical decision making (see chart for details).  10:37 AM Initial findings discussed with patient, generally reassuring.  She continues to c/o back pain and dyspnea.  1:12 PM Patient improved following albuterol breathing treatments. We discussed all findings at length, including reassuring x-ray, labs. I again discussed the importance of smoking cessation. No evidence for pneumonia, bacteremia, sepsis, ACS, PE, patient discharged with medication for COPD exacerbation.   Final Clinical Impressions(s) / ED Diagnoses   Final diagnoses:  COPD exacerbation (HCC)    New Prescriptions New Prescriptions   PREDNISONE (DELTASONE) 20 MG TABLET    Take 2 tablets (40 mg total) by mouth daily with breakfast. For the next four days     Carmin Muskrat, MD 11/22/16 1312

## 2016-11-22 NOTE — Discharge Instructions (Signed)
As discussed, with your ongoing shortness of breath, and discomfort, please take all medication as directed, and to use the provided albuterol inhaler every 4 hours for the next 2 days. Please be sure to follow up with your physician.

## 2016-11-29 ENCOUNTER — Ambulatory Visit (INDEPENDENT_AMBULATORY_CARE_PROVIDER_SITE_OTHER): Payer: 59 | Admitting: Internal Medicine

## 2016-11-29 ENCOUNTER — Encounter: Payer: Self-pay | Admitting: Internal Medicine

## 2016-11-29 VITALS — BP 118/80 | HR 88 | Temp 97.8°F | Resp 20 | Ht 62.0 in | Wt 145.1 lb

## 2016-11-29 DIAGNOSIS — J441 Chronic obstructive pulmonary disease with (acute) exacerbation: Secondary | ICD-10-CM | POA: Diagnosis not present

## 2016-11-29 DIAGNOSIS — R091 Pleurisy: Secondary | ICD-10-CM

## 2016-11-29 MED ORDER — ALBUTEROL SULFATE HFA 108 (90 BASE) MCG/ACT IN AERS: 2.0000 | INHALATION_SPRAY | Freq: Four times a day (QID) | RESPIRATORY_TRACT | 3 refills | Status: DC | PRN

## 2016-11-29 MED ORDER — METHYLPREDNISOLONE ACETATE 80 MG/ML IJ SUSP
120.0000 mg | Freq: Once | INTRAMUSCULAR | Status: AC
  Administered 2016-11-29: 120 mg via INTRAMUSCULAR

## 2016-11-29 MED ORDER — HYDROCODONE-ACETAMINOPHEN 5-325 MG PO TABS: 1.0000 | ORAL_TABLET | Freq: Four times a day (QID) | ORAL | 0 refills | Status: DC | PRN

## 2016-11-29 MED ORDER — TIOTROPIUM BROMIDE-OLODATEROL 2.5-2.5 MCG/ACT IN AERS: 2.0000 | INHALATION_SPRAY | Freq: Every day | RESPIRATORY_TRACT | 11 refills | Status: DC

## 2016-11-29 NOTE — Progress Notes (Signed)
Subjective:  Patient ID: Lynn Kline, female    DOB: June 28, 1956  Age: 61 y.o. MRN: [id number]  CC: Cough   HPI Lynn Kline presents for A one-week history of nonproductive cough, sharp pain in her sternum, throbbing pain elsewhere in her anterior chest wall, and pain upon coughing. She has gotten some symptom relief with albuterol. She also complains of shortness of breath but denies hemoptysis, palpitations, diaphoresis, lightheadedness, or dizziness. She was seen in the ER 5 days ago and had a normal chest x-ray, normal EKG, negative d-dimer, and normal labs. She has tried anti-inflammatories for the pain but has not gotten much symptom relief.  Outpatient Medications Prior to Visit  Medication Sig Dispense Refill  . ibuprofen (ADVIL,MOTRIN) 200 MG tablet Take 200 mg by mouth every 6 (six) hours as needed for headache or moderate pain.    . Naproxen Sod-Diphenhydramine (ALEVE PM) 220-25 MG TABS Take 1 tablet by mouth at bedtime as needed (For pain.).    Marland Kitchen predniSONE (DELTASONE) 20 MG tablet Take 2 tablets (40 mg total) by mouth daily with breakfast. For the next four days 8 tablet 0  . ALPRAZolam (XANAX) 0.25 MG tablet Take 0.25 mg by mouth 3 (three) times daily as needed for anxiety.      No facility-administered medications prior to visit.     ROS Review of Systems  Constitutional: Negative for appetite change, chills, diaphoresis, fatigue and fever.  HENT: Negative.  Negative for sinus pressure and trouble swallowing.   Eyes: Negative for visual disturbance.  Respiratory: Positive for cough and shortness of breath. Negative for choking, chest tightness, wheezing and stridor.   Cardiovascular: Positive for chest pain. Negative for palpitations and leg swelling.  Gastrointestinal: Negative for abdominal pain, constipation, diarrhea, nausea and vomiting.  Endocrine: Negative.   Genitourinary: Negative.  Negative for difficulty urinating.  Musculoskeletal: Negative for back  pain, myalgias and neck pain.  Skin: Negative.   Allergic/Immunologic: Negative.   Neurological: Negative.  Negative for dizziness, weakness and light-headedness.  Hematological: Negative for adenopathy. Does not bruise/bleed easily.  Psychiatric/Behavioral: Negative.     Objective:  BP 118/80 (BP Location: Left Arm, Patient Position: Sitting, Cuff Size: Normal)   Pulse 88   Temp 97.8 F (36.6 C) (Oral)   Resp 20   Ht 5\' 2"  (1.575 m)   Wt 145 lb 1.3 oz (65.8 kg)   SpO2 97%   BMI 26.54 kg/m   BP Readings from Last 3 Encounters:  11/29/16 118/80  11/22/16 127/69  10/30/14 125/55    Wt Readings from Last 3 Encounters:  11/29/16 145 lb 1.3 oz (65.8 kg)  11/22/16 147 lb (66.7 kg)  11/05/14 147 lb (66.7 kg)    Physical Exam  Constitutional: She is oriented to person, place, and time.  Non-toxic appearance. She does not have a sickly appearance. She does not appear ill. No distress.  HENT:  Mouth/Throat: Oropharynx is clear and moist. No oropharyngeal exudate.  Eyes: Conjunctivae are normal. Right eye exhibits no discharge. Left eye exhibits no discharge. No scleral icterus.  Neck: Normal range of motion. Neck supple. No JVD present. No tracheal deviation present. No thyromegaly present.  Cardiovascular: Normal rate, regular rhythm, normal heart sounds and intact distal pulses.  Exam reveals no gallop and no friction rub.   No murmur heard. Pulmonary/Chest: Effort normal. No accessory muscle usage or stridor. No tachypnea. No respiratory distress. She has decreased breath sounds in the right lower field and the left lower field.  She has no wheezes. She has rhonchi in the right middle field and the left middle field. She has no rales. She exhibits tenderness and bony tenderness. She exhibits no mass.    Abdominal: Soft. Bowel sounds are normal. She exhibits no distension and no mass. There is no tenderness. There is no rebound and no guarding.  Musculoskeletal: Normal range of  motion. She exhibits no edema, tenderness or deformity.  Lymphadenopathy:    She has no cervical adenopathy.  Neurological: She is oriented to person, place, and time.  Skin: Skin is warm and dry. No rash noted. She is not diaphoretic. No erythema. No pallor.  Vitals reviewed.   Lab Results  Component Value Date   WBC 8.6 11/22/2016   HGB 13.5 11/22/2016   HCT 39.9 11/22/2016   PLT 356 11/22/2016   GLUCOSE 101 (H) 11/22/2016   CHOL 234 (H) 08/19/2014   TRIG 125.0 08/19/2014   HDL 58.00 08/19/2014   LDLDIRECT 141.0 11/15/2011   LDLCALC 151 (H) 08/19/2014   ALT 24 11/22/2016   AST 21 11/22/2016   NA 140 11/22/2016   K 3.9 11/22/2016   CL 102 11/22/2016   CREATININE 0.69 11/22/2016   BUN 14 11/22/2016   CO2 30 11/22/2016   TSH 1.24 08/19/2014   HGBA1C 5.8 08/19/2014    Dg Chest 2 View  Result Date: 11/22/2016 CLINICAL DATA:  Sharp upper back and anterior left chest discomfort for the past 2 days associated with productive cough. History of COPD, current smoker. EXAM: CHEST  2 VIEW COMPARISON:  PA and lateral chest x-ray of August 07, 2014 FINDINGS: The lungs are hyperinflated with hemidiaphragm flattening. There is no focal infiltrate. There is no pleural effusion, pneumothorax, or pneumomediastinum. The heart and pulmonary vascularity are normal. The mediastinum is normal in width. The bony thorax exhibits no acute abnormality. IMPRESSION: Hyperinflation consistent with COPD. There is no pneumonia, CHF, nor other acute cardiopulmonary abnormality. Electronically Signed   By: David  Martinique M.D.   On: 11/22/2016 08:08    Assessment & Plan:   Lynn Kline was seen today for cough.  Diagnoses and all orders for this visit:  Viral pleurisy- we'll try to control the pain with Norco -     HYDROcodone-acetaminophen (NORCO/VICODIN) 5-325 MG tablet; Take 1 tablet by mouth every 6 (six) hours as needed for moderate pain.  Chronic obstructive pulmonary disease with acute exacerbation ([place])-  she is having an exacerbation of symptoms so I gave her an injection of Depo-Medrol. I've also asked her to start using a laba/lama combination. I gave her samples of Stiolto respimat and showed her how to use it. She demonstrated proficiency with its use. -     methylPREDNISolone acetate (DEPO-MEDROL) injection 120 mg; Inject 1.5 mLs (120 mg total) into the muscle once. -     Tiotropium Bromide-Olodaterol (STIOLTO RESPIMAT) 2.5-2.5 MCG/ACT AERS; Inhale 2 puffs into the lungs daily. -     albuterol (PROVENTIL HFA;VENTOLIN HFA) 108 (90 Base) MCG/ACT inhaler; Inhale 2 puffs into the lungs every 6 (six) hours as needed for wheezing or shortness of breath.   I have discontinued Ms. Wiederholt's ibuprofen, Naproxen Sod-Diphenhydramine, and predniSONE. I am also having her start on HYDROcodone-acetaminophen, Tiotropium Bromide-Olodaterol, and albuterol. Additionally, I am having her maintain her ALPRAZolam. We administered methylPREDNISolone acetate.  Meds ordered this encounter  Medications  . methylPREDNISolone acetate (DEPO-MEDROL) injection 120 mg  . HYDROcodone-acetaminophen (NORCO/VICODIN) 5-325 MG tablet    Sig: Take 1 tablet by mouth every 6 (six)  hours as needed for moderate pain.    Dispense:  25 tablet    Refill:  0  . Tiotropium Bromide-Olodaterol (STIOLTO RESPIMAT) 2.5-2.5 MCG/ACT AERS    Sig: Inhale 2 puffs into the lungs daily.    Dispense:  4 g    Refill:  11  . albuterol (PROVENTIL HFA;VENTOLIN HFA) 108 (90 Base) MCG/ACT inhaler    Sig: Inhale 2 puffs into the lungs every 6 (six) hours as needed for wheezing or shortness of breath.    Dispense:  1 Inhaler    Refill:  3     Follow-up: Return in about 3 weeks (around 12/20/2016).  Scarlette Calico, MD

## 2016-11-29 NOTE — Patient Instructions (Signed)
Cough, Adult Coughing is a reflex that clears your throat and your airways. Coughing helps to heal and protect your lungs. It is normal to cough occasionally, but a cough that happens with other symptoms or lasts a long time may be a sign of a condition that needs treatment. A cough may last only 2-3 weeks (acute), or it may last longer than 8 weeks (chronic). What are the causes? Coughing is commonly caused by:  Breathing in substances that irritate your lungs.  A viral or bacterial respiratory infection.  Allergies.  Asthma.  Postnasal drip.  Smoking.  Acid backing up from the stomach into the esophagus (gastroesophageal reflux).  Certain medicines.  Chronic lung problems, including COPD (or rarely, lung cancer).  Other medical conditions such as heart failure.  Follow these instructions at home: Pay attention to any changes in your symptoms. Take these actions to help with your discomfort:  Take medicines only as told by your health care provider. ? If you were prescribed an antibiotic medicine, take it as told by your health care provider. Do not stop taking the antibiotic even if you start to feel better. ? Talk with your health care provider before you take a cough suppressant medicine.  Drink enough fluid to keep your urine clear or pale yellow.  If the air is dry, use a cold steam vaporizer or humidifier in your bedroom or your home to help loosen secretions.  Avoid anything that causes you to cough at work or at home.  If your cough is worse at night, try sleeping in a semi-upright position.  Avoid cigarette smoke. If you smoke, quit smoking. If you need help quitting, ask your health care provider.  Avoid caffeine.  Avoid alcohol.  Rest as needed.  Contact a health care provider if:  You have new symptoms.  You cough up pus.  Your cough does not get better after 2-3 weeks, or your cough gets worse.  You cannot control your cough with suppressant  medicines and you are losing sleep.  You develop pain that is getting worse or pain that is not controlled with pain medicines.  You have a fever.  You have unexplained weight loss.  You have night sweats. Get help right away if:  You cough up blood.  You have difficulty breathing.  Your heartbeat is very fast. This information is not intended to replace advice given to you by your health care provider. Make sure you discuss any questions you have with your health care provider. Document Released: 04/01/2011 Document Revised: 03/10/2016 Document Reviewed: 12/10/2014 Elsevier Interactive Patient Education  2017 [employer]  

## 2016-11-29 NOTE — Progress Notes (Signed)
Pre visit review using our clinic review tool, if applicable. No additional management support is needed unless otherwise documented below in the visit note. 

## 2016-12-22 ENCOUNTER — Telehealth: Payer: Self-pay | Admitting: *Deleted

## 2016-12-22 ENCOUNTER — Other Ambulatory Visit: Payer: Self-pay | Admitting: Internal Medicine

## 2016-12-22 DIAGNOSIS — F419 Anxiety disorder, unspecified: Secondary | ICD-10-CM

## 2016-12-22 MED ORDER — ALPRAZOLAM 0.25 MG PO TABS: 0.2500 mg | ORAL_TABLET | Freq: Three times a day (TID) | ORAL | 2 refills | Status: DC | PRN

## 2016-12-22 NOTE — Telephone Encounter (Signed)
RX written 

## 2016-12-22 NOTE — Telephone Encounter (Signed)
Notified pt MD ok alprazolam script faxed to [facility].../lmb

## 2016-12-22 NOTE — Telephone Encounter (Signed)
Left msg on triage needing to get a refill on her alprazolam for anxiety...Lynn Kline

## 2017-03-01 ENCOUNTER — Encounter: Payer: Self-pay | Admitting: Gynecology

## 2017-06-24 ENCOUNTER — Other Ambulatory Visit: Payer: Self-pay | Admitting: Internal Medicine

## 2017-06-24 DIAGNOSIS — J441 Chronic obstructive pulmonary disease with (acute) exacerbation: Secondary | ICD-10-CM

## 2018-01-16 ENCOUNTER — Other Ambulatory Visit: Payer: Self-pay | Admitting: Internal Medicine

## 2018-01-16 DIAGNOSIS — J441 Chronic obstructive pulmonary disease with (acute) exacerbation: Secondary | ICD-10-CM

## 2018-07-16 ENCOUNTER — Other Ambulatory Visit: Payer: Self-pay | Admitting: Internal Medicine

## 2018-07-16 DIAGNOSIS — J441 Chronic obstructive pulmonary disease with (acute) exacerbation: Secondary | ICD-10-CM

## 2018-07-18 ENCOUNTER — Encounter: Payer: Self-pay | Admitting: Family

## 2018-07-18 ENCOUNTER — Other Ambulatory Visit (INDEPENDENT_AMBULATORY_CARE_PROVIDER_SITE_OTHER): Payer: 59

## 2018-07-18 ENCOUNTER — Ambulatory Visit: Payer: 59 | Admitting: Family

## 2018-07-18 ENCOUNTER — Ambulatory Visit: Payer: Self-pay | Admitting: *Deleted

## 2018-07-18 ENCOUNTER — Ambulatory Visit (INDEPENDENT_AMBULATORY_CARE_PROVIDER_SITE_OTHER)
Admission: RE | Admit: 2018-07-18 | Discharge: 2018-07-18 | Disposition: A | Discharge status: Home / Self Care | Payer: 59 | Source: Ambulatory Visit | Attending: Family | Admitting: Family

## 2018-07-18 VITALS — BP 110/74 | HR 86 | Temp 98.4°F | Ht 62.0 in | Wt 138.1 lb

## 2018-07-18 DIAGNOSIS — J441 Chronic obstructive pulmonary disease with (acute) exacerbation: Secondary | ICD-10-CM

## 2018-07-18 DIAGNOSIS — R0602 Shortness of breath: Secondary | ICD-10-CM

## 2018-07-18 DIAGNOSIS — F419 Anxiety disorder, unspecified: Secondary | ICD-10-CM | POA: Diagnosis not present

## 2018-07-18 LAB — CBC WITH DIFFERENTIAL/PLATELET
BASOS ABS: 0.1 10*3/uL (ref 0.0–0.1)
Basophils Relative: 1.1 % (ref 0.0–3.0)
EOS ABS: 0.3 10*3/uL (ref 0.0–0.7)
Eosinophils Relative: 3.1 % (ref 0.0–5.0)
HEMATOCRIT: 41.8 % (ref 36.0–46.0)
HEMOGLOBIN: 14.1 g/dL (ref 12.0–15.0)
LYMPHS PCT: 33.5 % (ref 12.0–46.0)
Lymphs Abs: 2.8 10*3/uL (ref 0.7–4.0)
MCHC: 33.7 g/dL (ref 30.0–36.0)
MCV: 92.4 fl (ref 78.0–100.0)
MONOS PCT: 7.2 % (ref 3.0–12.0)
Monocytes Absolute: 0.6 10*3/uL (ref 0.1–1.0)
NEUTROS ABS: 4.6 10*3/uL (ref 1.4–7.7)
Neutrophils Relative %: 55.1 % (ref 43.0–77.0)
Platelets: 331 10*3/uL (ref 150.0–400.0)
RBC: 4.52 Mil/uL (ref 3.87–5.11)
RDW: 13.3 % (ref 11.5–15.5)
WBC: 8.3 10*3/uL (ref 4.0–10.5)

## 2018-07-18 LAB — COMPREHENSIVE METABOLIC PANEL
ALBUMIN: 4.3 g/dL (ref 3.5–5.2)
ALT: 23 U/L (ref 0–35)
AST: 22 U/L (ref 0–37)
Alkaline Phosphatase: 61 U/L (ref 39–117)
BUN: 15 mg/dL (ref 6–23)
CALCIUM: 9.7 mg/dL (ref 8.4–10.5)
CO2: 32 meq/L (ref 19–32)
CREATININE: 0.77 mg/dL (ref 0.40–1.20)
Chloride: 102 mEq/L (ref 96–112)
GFR: 80.42 mL/min (ref >60.00)
Glucose, Bld: 95 mg/dL (ref 70–99)
Potassium: 4.2 mEq/L (ref 3.5–5.1)
Sodium: 139 mEq/L (ref 135–145)
Total Bilirubin: 0.5 mg/dL (ref 0.2–1.2)
Total Protein: 7.2 g/dL (ref 6.0–8.3)

## 2018-07-18 LAB — BRAIN NATRIURETIC PEPTIDE: Pro B Natriuretic peptide (BNP): 25 pg/mL (ref 0.0–100.0)

## 2018-07-18 MED ORDER — ALBUTEROL SULFATE HFA 108 (90 BASE) MCG/ACT IN AERS: 1.0000 | INHALATION_SPRAY | Freq: Four times a day (QID) | RESPIRATORY_TRACT | 0 refills | Status: DC | PRN

## 2018-07-18 MED ORDER — ALPRAZOLAM 0.25 MG PO TABS: 0.2500 mg | ORAL_TABLET | Freq: Two times a day (BID) | ORAL | 0 refills | Status: DC | PRN

## 2018-07-18 MED ORDER — METHYLPREDNISOLONE ACETATE 80 MG/ML IJ SUSP
80.0000 mg | Freq: Once | INTRAMUSCULAR | Status: AC
  Administered 2018-07-18: 80 mg via INTRAMUSCULAR

## 2018-07-18 NOTE — Progress Notes (Signed)
Lynn Kline is a 62 y.o. female with the following history as recorded in EpicCare:  Patient Active Problem List   Diagnosis Date Noted  . Viral pleurisy 11/29/2016  . Gallstones 11/06/2014  . Hyperlipidemia with target LDL less than 130 08/19/2014  . Cervical cancer screening 08/19/2014  . Dysphagia, pharyngoesophageal phase 08/12/2014  . Dyspepsia 08/12/2014  . IBS (irritable bowel syndrome) 08/12/2014  . Visit for screening mammogram 12/14/2011  . Routine general medical examination at a health care facility 12/14/2011  . Hyperglycemia 11/15/2011  . COPD (chronic obstructive pulmonary disease) ([place]) 11/15/2011  . Palpitations 11/15/2011  . Tobacco abuse 11/15/2011  . Anxiety 11/15/2011    Current Outpatient Medications  Medication Sig Dispense Refill  . albuterol (VENTOLIN HFA) 108 (90 Base) MCG/ACT inhaler Inhale 1-2 puffs into the lungs every 6 (six) hours as needed for wheezing or shortness of breath. 36 Inhaler 0  . ALPRAZolam (XANAX) 0.25 MG tablet Take 1 tablet (0.25 mg total) by mouth 2 (two) times daily as needed for anxiety. 20 tablet 0   No current facility-administered medications for this visit.     Allergies: Codeine; Erythromycin; and Ampicillin  Past Medical History:  Diagnosis Date  . Anxiety   . Atherosclerosis   . Colon polyp, hyperplastic   . COPD (chronic obstructive pulmonary disease) ([place])   . Diverticulosis   . Gallstone     Past Surgical History:  Procedure Laterality Date  . ABDOMINAL HYSTERECTOMY      Family History  Problem Relation Age of Onset  . Arthritis Mother   . COPD Mother   . Emphysema Mother   . Stroke Father   . Heart disease Father   . Cancer Father        MELANOMA  . Breast cancer Maternal Grandmother   . Colon cancer Neg Hx     Social History   Tobacco Use  . Smoking status: Current Every Day Smoker    Packs/day: 0.50    Years: 35.00    Pack years: 17.50    Types: Cigarettes  . Smokeless tobacco: Never Used   Substance Use Topics  . Alcohol use: No    Alcohol/week: 0.0 standard drinks    Subjective:  Known history of COPD; presents with 2 month history of worsening SOB; has been given Anoro in the past but admits she does not feel comfortable with daily medication; patient is suspicious that she might "just have an infection." notes that she is using her rescue inhaler 6-7 x per day; Feels sensation of air hunger; localized area of chest pain over sternum; no chest pain on radiation; no dizziness;    Objective:  Vitals:   07/18/18 1419  BP: 110/74  Pulse: 86  Temp: 98.4 F (36.9 C)  TempSrc: Oral  SpO2: 99%  Weight: 138 lb 1.3 oz (62.6 kg)  Height: _0  (1.575 m)    General: Well developed, well nourished, in no acute distress  Skin : Warm and dry.  Head: Normocephalic and atraumatic  Eyes: Sclera and conjunctiva clear; pupils round and reactive to light; extraocular movements intact  Ears: External normal; canals clear; tympanic membranes normal  Oropharynx: Pink, supple. No suspicious lesions  Neck: Supple without thyromegaly, adenopathy  Lungs: Respirations unlabored; clear to auscultation bilaterally without wheeze, rales, rhonchi  CVS exam: normal rate and regular rhythm.  Neurologic: Alert and oriented; speech intact; face symmetrical; moves all extremities well; CNII-XII intact without focal deficit   Assessment:  1. Chronic obstructive pulmonary  disease with acute exacerbation ([place])   2. Shortness of breath   3. Anxiety     Plan:  Update EKG- no acute changes seen; 2nd EKG had to be done as original test was incorrect; update CXR; update labs; Depo-Medrol IM 80 mg given in office; stressed need to quit smoking; stressed need to start taking daily medication to prevent flares- she agrees to trial of QVAR 40- will start with 1 puff bid/ rinse and spit; plan to follow-up with her PCP in 2-4 weeks;  Discussed lung cancer CT screen- she defers; she defers pulmonology at this  time as well; wants to discuss with her PCP.  Short-term refill given on Xanax; database checked and no irregular fills noted; feel appropriate to refill medication.   No follow-ups on file.  Orders Placed This Encounter  Procedures  . DG Chest 2 View    Standing Status:   Future    Number of Occurrences:   1    Standing Expiration Date:   09/18/2019    Order Specific Question:   Reason for Exam (SYMPTOM  OR DIAGNOSIS REQUIRED)    Answer:   shortness of breath/ COPD    Order Specific Question:   Preferred imaging location?    Answer:   [facility]    Order Specific Question:   Radiology Contrast Protocol - do NOT remove file path    Answer:   \\[email or web address]  . CBC w/Diff    Standing Status:   Future    Number of Occurrences:   1    Standing Expiration Date:   07/18/2019  . Comp Met (CMET)    Standing Status:   Future    Number of Occurrences:   1    Standing Expiration Date:   07/18/2019  . D-Dimer, Quantitative    Standing Status:   Future    Number of Occurrences:   1    Standing Expiration Date:   07/18/2019  . B Nat Peptide    Standing Status:   Future    Number of Occurrences:   1    Standing Expiration Date:   07/18/2019  . EKG 12-Lead  . EKG 12-Lead    Requested Prescriptions   Signed Prescriptions Disp Refills  . albuterol (VENTOLIN HFA) 108 (90 Base) MCG/ACT inhaler 36 Inhaler 0    Sig: Inhale 1-2 puffs into the lungs every 6 (six) hours as needed for wheezing or shortness of breath.  . ALPRAZolam (XANAX) 0.25 MG tablet 20 tablet 0    Sig: Take 1 tablet (0.25 mg total) by mouth 2 (two) times daily as needed for anxiety.

## 2018-07-18 NOTE — Telephone Encounter (Signed)
Pt with H/O COPD. States "Having to use my inhalers more often past 2 months." Reports SOB with exertion only. States inhalers effective but "Having to use 5-6 times a day." Also reports "Itchy throat" and occasional dry cough, states "Chest feels heavy at times past 2 months." Denies SOB at rest. Appt made for today with L. Valere Dross. Care advise given per protocol.  Reason for Disposition . [1] Longstanding difficulty breathing (e.g., CHF, COPD, emphysema) AND [2] WORSE than normal  Answer Assessment - Initial Assessment Questions 1. RESPIRATORY STATUS: "Describe your breathing?" (e.g., wheezing, shortness of breath, unable to speak, severe coughing)      SOB with exertion 2. ONSET: "When did this breathing problem begin?"      H/O COPD worsening x 2 months 3. PATTERN "Does the difficult breathing come and go, or has it been constant since it started?"     Intermittent 4. SEVERITY: "How bad is your breathing?" (e.g., mild, moderate, severe)    - MILD: No SOB at rest, mild SOB with walking, speaks normally in sentences, can lay down, no retractions, pulse < 100.    - MODERATE: SOB at rest, SOB with minimal exertion and prefers to sit, cannot lie down flat, speaks in phrases, mild retractions, audible wheezing, pulse 100-120.    - SEVERE: Very SOB at rest, speaks in single words, struggling to breathe, sitting hunched forward, retractions, pulse > 120      Varies mild to moderate 5. RECURRENT SYMPTOM: "Have you had difficulty breathing before?" If so, ask: "When was the last time?" and "What happened that time?"      H/O COPD 6. CARDIAC HISTORY: "Do you have any history of heart disease?" (e.g., heart attack, angina, bypass surgery, angioplasty)      no 7. LUNG HISTORY: "Do you have any history of lung disease?"  (e.g., pulmonary embolus, asthma, emphysema)     COPD 8. CAUSE: "What do you think is causing the breathing problem?"      COPD 9. OTHER SYMPTOMS: "Do you have any other symptoms?  (e.g., dizziness, runny nose, cough, chest pain, fever)     Intermittent cough, dry; itchy throat.  Protocols used: BREATHING DIFFICULTY-A-AH

## 2018-07-19 ENCOUNTER — Other Ambulatory Visit: Payer: Self-pay | Admitting: Family

## 2018-07-19 ENCOUNTER — Ambulatory Visit (INDEPENDENT_AMBULATORY_CARE_PROVIDER_SITE_OTHER)
Admission: RE | Admit: 2018-07-19 | Discharge: 2018-07-19 | Disposition: A | Discharge status: Home / Self Care | Payer: 59 | Source: Ambulatory Visit | Attending: Family | Admitting: Family

## 2018-07-19 DIAGNOSIS — M546 Pain in thoracic spine: Secondary | ICD-10-CM

## 2018-07-19 DIAGNOSIS — J90 Pleural effusion, not elsewhere classified: Secondary | ICD-10-CM

## 2018-07-19 LAB — D-DIMER, QUANTITATIVE: D-Dimer, Quant: 0.4 mcg/mL FEU (ref <0.50)

## 2018-07-19 MED ORDER — DOXYCYCLINE HYCLATE 100 MG PO TABS: 100.0000 mg | ORAL_TABLET | Freq: Two times a day (BID) | ORAL | 0 refills | Status: DC

## 2018-07-24 ENCOUNTER — Ambulatory Visit (INDEPENDENT_AMBULATORY_CARE_PROVIDER_SITE_OTHER)
Admission: RE | Admit: 2018-07-24 | Discharge: 2018-07-24 | Disposition: A | Discharge status: Home / Self Care | Payer: 59 | Source: Ambulatory Visit | Attending: Family | Admitting: Family

## 2018-07-24 DIAGNOSIS — J90 Pleural effusion, not elsewhere classified: Secondary | ICD-10-CM | POA: Diagnosis not present

## 2018-07-27 ENCOUNTER — Other Ambulatory Visit: Payer: Self-pay | Admitting: Family

## 2018-07-27 DIAGNOSIS — J439 Emphysema, unspecified: Secondary | ICD-10-CM

## 2018-07-30 DIAGNOSIS — J439 Emphysema, unspecified: Secondary | ICD-10-CM

## 2018-08-22 ENCOUNTER — Other Ambulatory Visit: Payer: Self-pay | Admitting: Internal Medicine

## 2018-08-22 ENCOUNTER — Ambulatory Visit: Payer: 59 | Admitting: Pulmonary Disease

## 2018-08-22 ENCOUNTER — Encounter: Payer: Self-pay | Admitting: Pulmonary Disease

## 2018-08-22 VITALS — BP 124/82 | HR 84 | Ht 62.0 in | Wt 140.0 lb

## 2018-08-22 DIAGNOSIS — J432 Centrilobular emphysema: Secondary | ICD-10-CM | POA: Diagnosis not present

## 2018-08-22 DIAGNOSIS — J441 Chronic obstructive pulmonary disease with (acute) exacerbation: Secondary | ICD-10-CM | POA: Diagnosis not present

## 2018-08-22 DIAGNOSIS — M8580 Other specified disorders of bone density and structure, unspecified site: Secondary | ICD-10-CM

## 2018-08-22 DIAGNOSIS — S22000A Wedge compression fracture of unspecified thoracic vertebra, initial encounter for closed fracture: Secondary | ICD-10-CM

## 2018-08-22 DIAGNOSIS — F419 Anxiety disorder, unspecified: Secondary | ICD-10-CM

## 2018-08-22 MED ORDER — ALBUTEROL SULFATE (2.5 MG/3ML) 0.083% IN NEBU: 2.5000 mg | INHALATION_SOLUTION | RESPIRATORY_TRACT | 5 refills | Status: DC | PRN

## 2018-08-22 MED ORDER — ALBUTEROL SULFATE HFA 108 (90 BASE) MCG/ACT IN AERS: 1.0000 | INHALATION_SPRAY | Freq: Four times a day (QID) | RESPIRATORY_TRACT | 5 refills | Status: DC | PRN

## 2018-08-22 MED ORDER — VITAMIN D 50 MCG (2000 UT) PO CAPS: 2000.0000 [IU] | ORAL_CAPSULE | Freq: Every day | ORAL | 5 refills | Status: DC

## 2018-08-22 NOTE — Patient Instructions (Addendum)
Thank you for visiting Dr. Valeta Harms at [facility]. Today we recommend the following: Orders Placed This Encounter  Procedures  . DG Bone Density  . Ambulatory Referral for DME  . Spirometry with graph   Meds ordered this encounter  Medications  . albuterol (VENTOLIN HFA) 108 (90 Base) MCG/ACT inhaler    Sig: Inhale 1-2 puffs into the lungs every 6 (six) hours as needed for wheezing or shortness of breath.    Dispense:  1 Inhaler    Refill:  5  . Cholecalciferol (VITAMIN D) 50 MCG (2000 UT) CAPS    Sig: Take 1 capsule (2,000 Units total) by mouth daily.    Dispense:  30 capsule    Refill:  5  . albuterol (PROVENTIL) (2.5 MG/3ML) 0.083% nebulizer solution    Sig: Take 3 mLs (2.5 mg total) by nebulization every 4 (four) hours as needed for wheezing or shortness of breath.    Dispense:  75 mL    Refill:  5   No follow-ups on file.  We are moving our office in November. The new address will be: [address] [employer] [address] Phone: [phone]

## 2018-08-22 NOTE — Progress Notes (Signed)
Synopsis: Referred in November 2019 for emphysema by Marrian Salvage,*  Subjective:   PATIENT ID: Lynn Kline GENDER: female DOB: 13-Jun-1956, MRN: [id number]  Chief Complaint  Patient presents with  . Consult    Consult for emphysema, states she has been SOB for last 3 months, chest feels tight and heavy, dry cough. States she uses rescue inhaler constantly throughout the day.     PMH of tobacco abuse, smoked for 45 years, she recently had a CT chest with upper lobe emphysema. She recently found out that her 3 family members on her fathers side died of pulmonary fibrosis. Father with two brothers that died of lung cancer. Father died of melanoma. She is a current smoker, 5 cigs per day. She is really trying to stop. Never had any PFTs before. She gets really short of breath when she walks. If she takes her dog for a walk she will have to stop at the end of street and rest. She has chest tightness and usually can only due a few things for a few minutes and then she has to stop and sit and rest. She makes eye glasses for a living. She lives off her ventolin inhaler.  She has been using her albuterol inhaler 8-12 times per day.  She usually goes through 3-4 albuterol inhalers per month.   Past Medical History:  Diagnosis Date  . Anxiety   . Atherosclerosis   . Colon polyp, hyperplastic   . COPD (chronic obstructive pulmonary disease) ([place])   . Diverticulosis   . Gallstone      Family History  Problem Relation Age of Onset  . Arthritis Mother   . COPD Mother   . Emphysema Mother   . Stroke Father   . Heart disease Father   . Cancer Father        MELANOMA  . Breast cancer Maternal Grandmother   . Colon cancer Neg Hx      Past Surgical History:  Procedure Laterality Date  . ABDOMINAL HYSTERECTOMY      Social History   Socioeconomic History  . Marital status: Divorced    Spouse name: Not on file  . Number of children: Not on file  . Years of education: Not on  file  . Highest education level: Not on file  Occupational History  . Not on file  Social Needs  . Financial resource strain: Not on file  . Food insecurity:    Worry: Not on file    Inability: Not on file  . Transportation needs:    Medical: Not on file    Non-medical: Not on file  Tobacco Use  . Smoking status: Current Every Day Smoker    Packs/day: 0.50    Years: 35.00    Pack years: 17.50    Types: Cigarettes  . Smokeless tobacco: Never Used  . Tobacco comment: 4-5 cigarettes per day 11.6.19   Substance and Sexual Activity  . Alcohol use: No    Alcohol/week: 0.0 standard drinks  . Drug use: No  . Sexual activity: Never    Comment: 1ST INTERCOURSE- 16, PARTNERS- 4  Lifestyle  . Physical activity:    Days per week: Not on file    Minutes per session: Not on file  . Stress: Not on file  Relationships  . Social connections:    Talks on phone: Not on file    Gets together: Not on file    Attends religious service: Not on file  Active member of club or organization: Not on file    Attends meetings of clubs or organizations: Not on file    Relationship status: Not on file  . Intimate partner violence:    Fear of current or ex partner: Not on file    Emotionally abused: Not on file    Physically abused: Not on file    Forced sexual activity: Not on file  Other Topics Concern  . Not on file  Social History Narrative  . Not on file     Allergies  Allergen Reactions  . Codeine Nausea And Vomiting  . Erythromycin Nausea And Vomiting  . Ampicillin Nausea And Vomiting, Rash and Other (See Comments)    Pt states that rash was on her tongue.  Has patient had a PCN reaction causing immediate rash, facial/tongue/throat swelling, SOB or lightheadedness with hypotension yes Has patient had a PCN reaction causing severe rash involving mucus membranes or skin necrosis: no Has patient had a PCN reaction that required hospitalization yes Has patient had a PCN reaction  occurring within the last 10 years: no If all of the above answers are "NO", then may proceed with Cephalosporin use.      Outpatient Medications Prior to Visit  Medication Sig Dispense Refill  . ALPRAZolam (XANAX) 0.25 MG tablet Take 1 tablet (0.25 mg total) by mouth 2 (two) times daily as needed for anxiety. 20 tablet 0  . albuterol (VENTOLIN HFA) 108 (90 Base) MCG/ACT inhaler Inhale 1-2 puffs into the lungs every 6 (six) hours as needed for wheezing or shortness of breath. 36 Inhaler 0  . doxycycline (VIBRA-TABS) 100 MG tablet Take 1 tablet (100 mg total) by mouth 2 (two) times daily. (Patient not taking: Reported on 08/22/2018) 20 tablet 0   No facility-administered medications prior to visit.     Review of Systems  Constitutional: Negative.   HENT: Positive for congestion.   Eyes: Negative.   Respiratory: Positive for cough, shortness of breath and wheezing. Negative for hemoptysis and sputum production.   Cardiovascular: Positive for chest pain and orthopnea. Negative for palpitations, claudication, leg swelling and PND.  Gastrointestinal: Negative.   Genitourinary: Negative.   Musculoskeletal: Negative.   Skin: Negative.   Neurological: Negative.   Endo/Heme/Allergies: Negative.   Psychiatric/Behavioral: Negative for depression, hallucinations, memory loss, substance abuse and suicidal ideas. The patient is nervous/anxious. The patient does not have insomnia.      Objective:  Physical Exam  Constitutional: She is oriented to person, place, and time. She appears well-developed. No distress.  HENT:  Head: Normocephalic and atraumatic.  Mouth/Throat: Oropharynx is clear and moist. No oropharyngeal exudate.  Eyes: Pupils are equal, round, and reactive to light. Conjunctivae and EOM are normal.  Neck: No JVD present. No tracheal deviation present.  Loss of supraclavicular fat  Cardiovascular: Normal rate, regular rhythm, S1 normal, S2 normal and intact distal pulses.  Distant  heart tones  Pulmonary/Chest: No accessory muscle usage or stridor. No tachypnea. She has decreased breath sounds (throughout all lung fields). She has wheezes. She has no rhonchi. She has no rales.  Increased AP chest diameter  Abdominal: Soft. Bowel sounds are normal. She exhibits no distension. There is no tenderness.  Musculoskeletal: She exhibits deformity (muscle wasting ). She exhibits no edema.  Neurological: She is alert and oriented to person, place, and time.  Skin: Skin is warm and dry. Capillary refill takes less than 2 seconds. No rash noted.  Psychiatric: She has a normal mood and  affect. Her behavior is normal.  Vitals reviewed.    Vitals:   08/22/18 1613  BP: 124/82  Pulse: 84  SpO2: 95%  Weight: 140 lb (63.5 kg)  Height: 5\' 2"  (1.575 m)   95% on RA BMI Readings from Last 3 Encounters:  08/22/18 25.61 kg/m  07/18/18 25.26 kg/m  11/29/16 26.54 kg/m   Wt Readings from Last 3 Encounters:  08/22/18 140 lb (63.5 kg)  07/18/18 138 lb 1.3 oz (62.6 kg)  11/29/16 145 lb 1.3 oz (65.8 kg)     CBC    Component Value Date/Time   WBC 8.3 07/18/2018 1529   RBC 4.52 07/18/2018 1529   HGB 14.1 07/18/2018 1529   HCT 41.8 07/18/2018 1529   PLT 331.0 07/18/2018 1529   MCV 92.4 07/18/2018 1529   MCH 30.8 11/22/2016 0832   MCHC 33.7 07/18/2018 1529   RDW 13.3 07/18/2018 1529   LYMPHSABS 2.8 07/18/2018 1529   MONOABS 0.6 07/18/2018 1529   EOSABS 0.3 07/18/2018 1529   BASOSABS 0.1 07/18/2018 1529    Chest Imaging: 07/24/2018 -CT chest with evidence of mild upper lobe predominant emphysematous change..  Pulmonary Functions Testing Results: No flowsheet data found.  FeNO: None   Pathology: None   Echocardiogram: None   Heart Catheterization: None    Assessment & Plan:   Centrilobular emphysema (HCC) - Plan: Spirometry with graph, Ambulatory Referral for DME, Pulmonary function test, 6 minute walk  Chronic obstructive pulmonary disease with acute  exacerbation (HCC) - Plan: albuterol (VENTOLIN HFA) 108 (90 Base) MCG/ACT inhaler  Osteopenia, unspecified location - Plan: DG Bone Density  Compression fracture of thoracic vertebra, initial encounter, unspecified thoracic vertebral level (HCC)  Discussion:  This is a 62 year old female who is a current smoker she has CT imaging consistent with upper lobe emphysema with no prior PFTs.  I suspect she has moderate to severe obstructive lung disease.  Additionally she had a thoracic compression fracture on chest imaging.  This is concerning for osteoporosis.  Plan: We will complete office spirometry today.  This revealed after 3 attempts average FEV1 in the mid 20s.  Consistent with a severe airflow obstruction.  We also checked to the patient's inspiratory flow rate which was greater than 60 L/min on in-check dial inhaler training device.  We will start the patient on Trelegy.  Vitamin D 2000 units daily. We will order a DEXA scan. Albuterol nebulizer solution as well as DME supplier for new nebulizer machine. Return to clinic in 6 to 8 weeks with full PFTs and 6-minute walk  Greater than 50% of this patient's 45-minute visit was spent face-to-face discussing the above recommendations and treatment   Current Outpatient Medications:  .  albuterol (VENTOLIN HFA) 108 (90 Base) MCG/ACT inhaler, Inhale 1-2 puffs into the lungs every 6 (six) hours as needed for wheezing or shortness of breath., Disp: 1 Inhaler, Rfl: 5 .  ALPRAZolam (XANAX) 0.25 MG tablet, Take 1 tablet (0.25 mg total) by mouth 2 (two) times daily as needed for anxiety., Disp: 20 tablet, Rfl: 0 .  albuterol (PROVENTIL) (2.5 MG/3ML) 0.083% nebulizer solution, Take 3 mLs (2.5 mg total) by nebulization every 4 (four) hours as needed for wheezing or shortness of breath., Disp: 75 mL, Rfl: 5 .  Cholecalciferol (VITAMIN D) 50 MCG (2000 UT) CAPS, Take 1 capsule (2,000 Units total) by mouth daily., Disp: 30 capsule, Rfl: 5   Garner Nash, DO [facility] 08/22/2018 9:17 PM

## 2018-08-24 ENCOUNTER — Telehealth: Payer: Self-pay

## 2018-08-24 ENCOUNTER — Ambulatory Visit (INDEPENDENT_AMBULATORY_CARE_PROVIDER_SITE_OTHER)
Admission: RE | Admit: 2018-08-24 | Discharge: 2018-08-24 | Disposition: A | Discharge status: Home / Self Care | Payer: 59 | Source: Ambulatory Visit

## 2018-08-24 DIAGNOSIS — S22000A Wedge compression fracture of unspecified thoracic vertebra, initial encounter for closed fracture: Secondary | ICD-10-CM

## 2018-08-24 DIAGNOSIS — M8580 Other specified disorders of bone density and structure, unspecified site: Secondary | ICD-10-CM

## 2018-09-07 NOTE — Telephone Encounter (Signed)
Nothing further needed at this time. 

## 2018-09-17 ENCOUNTER — Telehealth: Payer: Self-pay | Admitting: Pulmonary Disease

## 2018-09-17 MED ORDER — FLUTICASONE-UMECLIDIN-VILANT 100-62.5-25 MCG/INH IN AEPB: 1.0000 | INHALATION_SPRAY | Freq: Every day | RESPIRATORY_TRACT | 0 refills | Status: DC

## 2018-09-17 NOTE — Telephone Encounter (Signed)
Plan from last OV with BI 08/22/18: We will complete office spirometry today.  This revealed after 3 attempts average FEV1 in the mid 20s.  Consistent with a severe airflow obstruction.  We also checked to the patient's inspiratory flow rate which was greater than 60 L/min on in-check dial inhaler training device.  We will start the patient on Trelegy.  Vitamin D 2000 units daily. We will order a DEXA scan. Albuterol nebulizer solution as well as DME supplier for new nebulizer machine. Return to clinic in 6 to 8 weeks with full PFTs and 6-minute walk  Called and spoke with pt to see if I could get more info from her for why insurance would not pay for neb machine and pt stated she had received a call from [facility] and that was stated to her by them. Pt stated the person she spoke to could not really provide her that much information why they would not pay for it but she stated they did mention something about a deductible to her. I stated to pt that I would try to call [facility] tomorrow, 09/18/18 as they were currently closed. I also stated to pt that we did have a sample of Trelegy that I was placing up front for her to come pick up. Pt expressed understanding.  Will keep encounter open as [facility] will need to be called 09/18/18.

## 2018-09-17 NOTE — Telephone Encounter (Signed)
LVM for Lynn Kline with [facility] to return call regarding below message.  Called [facility] [phone] ext [phone], LVM today for return call back. X1

## 2018-09-18 ENCOUNTER — Telehealth: Payer: Self-pay | Admitting: Pulmonary Disease

## 2018-09-18 NOTE — Telephone Encounter (Signed)
Called spoke with patient. She understands her high deductible insurance. She asked if she could buy one on [place] with out an Rx and I explained to her she could.   Nothing further needed at this time.

## 2018-09-18 NOTE — Telephone Encounter (Signed)
ATC pt, no answer. Left message for pt to call back.  

## 2018-09-18 NOTE — Telephone Encounter (Signed)
[name] [facility] [phone] ext [phone]); reason why patient insurance will not cover the item; is because they have not met their $4200 deductible; when deductible is met the insurance will cover 80%; or $7900 for a 100% coverage.Lynn Kline

## 2018-09-18 NOTE — Telephone Encounter (Signed)
Left message with [facility] to call us back about patient's nebulizer machine.

## 2018-09-18 NOTE — Telephone Encounter (Signed)
Patient returned call, CB is [phone].

## 2018-10-02 ENCOUNTER — Telehealth: Payer: Self-pay | Admitting: Pulmonary Disease

## 2018-10-02 MED ORDER — FLUTICASONE-UMECLIDIN-VILANT 100-62.5-25 MCG/INH IN AEPB: 1.0000 | INHALATION_SPRAY | Freq: Every day | RESPIRATORY_TRACT | 0 refills | Status: DC

## 2018-10-02 MED ORDER — FLUTICASONE-UMECLIDIN-VILANT 100-62.5-25 MCG/INH IN AEPB: 1.0000 | INHALATION_SPRAY | Freq: Every day | RESPIRATORY_TRACT | 2 refills | Status: DC

## 2018-10-02 NOTE — Telephone Encounter (Signed)
Called and spoke with pt letting her know that we could provide her with 1 sample of Trelegy but stated to her that we needed to send Rx to pharmacy. Pt expressed understanding. Verified pt's preferred pharmacy and sent Rx in and have also placed sample up front for pt. Nothing further needed.

## 2018-10-24 ENCOUNTER — Encounter: Payer: Self-pay | Admitting: Pulmonary Disease

## 2018-10-24 ENCOUNTER — Ambulatory Visit: Payer: 59 | Admitting: Pulmonary Disease

## 2018-10-24 ENCOUNTER — Ambulatory Visit (INDEPENDENT_AMBULATORY_CARE_PROVIDER_SITE_OTHER): Payer: 59 | Admitting: Pulmonary Disease

## 2018-10-24 VITALS — BP 110/80 | HR 84 | Ht 62.0 in | Wt 148.0 lb

## 2018-10-24 DIAGNOSIS — J449 Chronic obstructive pulmonary disease, unspecified: Secondary | ICD-10-CM | POA: Diagnosis not present

## 2018-10-24 DIAGNOSIS — Z23 Encounter for immunization: Secondary | ICD-10-CM | POA: Diagnosis not present

## 2018-10-24 DIAGNOSIS — R0602 Shortness of breath: Secondary | ICD-10-CM

## 2018-10-24 DIAGNOSIS — Z720 Tobacco use: Secondary | ICD-10-CM

## 2018-10-24 DIAGNOSIS — R0609 Other forms of dyspnea: Secondary | ICD-10-CM

## 2018-10-24 DIAGNOSIS — J432 Centrilobular emphysema: Secondary | ICD-10-CM

## 2018-10-24 DIAGNOSIS — M8588 Other specified disorders of bone density and structure, other site: Secondary | ICD-10-CM | POA: Diagnosis not present

## 2018-10-24 LAB — PULMONARY FUNCTION TEST
DL/VA % pred: 83 %
DL/VA: 3.77 ml/min/mmHg/L
DLCO UNC: 15.91 ml/min/mmHg
DLCO unc % pred: 73 %
FEF 25-75 Post: 0.49 L/sec
FEF 25-75 Pre: 0.54 L/sec
FEF2575-%Change-Post: -8 %
FEF2575-%PRED-POST: 23 %
FEF2575-%Pred-Pre: 25 %
FEV1-%CHANGE-POST: -7 %
FEV1-%Pred-Post: 47 %
FEV1-%Pred-Pre: 51 %
FEV1-Post: 1.08 L
FEV1-Pre: 1.17 L
FEV1FVC-%Change-Post: -4 %
FEV1FVC-%Pred-Pre: 67 %
FEV6-%Change-Post: -1 %
FEV6-%PRED-POST: 74 %
FEV6-%PRED-PRE: 75 %
FEV6-PRE: 2.17 L
FEV6-Post: 2.12 L
FEV6FVC-%CHANGE-POST: 1 %
FEV6FVC-%PRED-POST: 102 %
FEV6FVC-%PRED-PRE: 101 %
FVC-%Change-Post: -3 %
FVC-%PRED-POST: 72 %
FVC-%Pred-Pre: 74 %
FVC-Post: 2.15 L
FVC-Pre: 2.22 L
POST FEV6/FVC RATIO: 99 %
PRE FEV6/FVC RATIO: 97 %
Post FEV1/FVC ratio: 50 %
Pre FEV1/FVC ratio: 52 %
RV % pred: 190 %
RV: 3.7 L
TLC % pred: 130 %
TLC: 6.19 L

## 2018-10-24 MED ORDER — FLUTICASONE-UMECLIDIN-VILANT 100-62.5-25 MCG/INH IN AEPB: 1.0000 | INHALATION_SPRAY | Freq: Every day | RESPIRATORY_TRACT | 5 refills | Status: DC

## 2018-10-24 MED ORDER — CALCIUM CITRATE-VITAMIN D 250-100 MG-UNIT PO TABS: 1.0000 | ORAL_TABLET | Freq: Every day | ORAL | 5 refills | Status: DC

## 2018-10-24 NOTE — Progress Notes (Signed)
PFT completed today.  

## 2018-10-24 NOTE — Patient Instructions (Addendum)
Thank you for visiting Dr. Valeta Harms at [facility]. Today we recommend the following:  Meds ordered this encounter  Medications  . Fluticasone-Umeclidin-Vilant (TRELEGY ELLIPTA) 100-62.5-25 MCG/INH AEPB    Sig: Inhale 1 puff into the lungs daily.    Dispense:  60 each    Refill:  5    Order Specific Question:   Lot Number?    Answer:   [id number]    Order Specific Question:   Expiration Date?    Answer:   11/17/2019    Order Specific Question:   Manufacturer?    Answer:   [employer] [[id number]]    Order Specific Question:   Quantity    Answer:   1  . calcium-vitamin D 250-100 MG-UNIT tablet    Sig: Take 1 tablet by mouth daily.    Dispense:  30 tablet    Refill:  5   You will need a repeat low-dose lung cancer screening CT in October 2020. Please set up appointment with Eric Form, NP for enrollment in the lung cancer screening program in October 2020.  Return in about 6 months (around 04/24/2019).

## 2018-10-24 NOTE — Addendum Note (Signed)
Addended by: Vivia Ewing on: 10/24/2018 02:01 PM   Modules accepted: Orders

## 2018-10-24 NOTE — Progress Notes (Signed)
Synopsis: Referred in November 2019 for emphysema by Janith Lima, MD  Subjective:   PATIENT ID: Lynn Kline GENDER: female DOB: [birth year], MRN: [id number]  Chief Complaint  Patient presents with  . Follow-up    PFT today. States she is having increased wheezing and SOB over last week and has some chest congestion.     PMH of tobacco abuse, smoked for 45 years, she recently had a CT chest with upper lobe emphysema. She recently found out that her 3 family members on her fathers side died of pulmonary fibrosis. Father with two brothers that died of lung cancer. Father died of melanoma. She is a current smoker, 5 cigs per day. She is really trying to stop. Never had any PFTs before. She gets really short of breath when she walks. If she takes her dog for a walk she will have to stop at the end of street and rest. She has chest tightness and usually can only due a few things for a few minutes and then she has to stop and sit and rest. She makes eye glasses for a living. She lives off her ventolin inhaler.  She has been using her albuterol inhaler 8-12 times per day.  She usually goes through 3-4 albuterol inhalers per month.  OV 10/24/2018: Patient has been doing well since her last visit.  She has been smoke-free for the past 2 days.  She had PFTs completed in today's office visit that revealed an FEV1 postbronchodilator of 1.08 L, 47% predicted an RV of 190%, DLCO 73%.  Patient still describes significant dyspnea on exertion.  She is having trouble getting in from her car to the house.  She does have daily cough and sputum production which is a little bit better.  She does think that the Trelegy has made a big difference with using it each day.  Overall she is a little bit anxious about the new diagnosis of COPD.  Patient denies chest pain or hemoptysis.   Past Medical History:  Diagnosis Date  . Anxiety   . Atherosclerosis   . Colon polyp, hyperplastic   . COPD (chronic obstructive  pulmonary disease) ([place])   . Diverticulosis   . Gallstone      Family History  Problem Relation Age of Onset  . Arthritis Mother   . COPD Mother   . Emphysema Mother   . Stroke Father   . Heart disease Father   . Cancer Father        MELANOMA  . Breast cancer Maternal Grandmother   . Colon cancer Neg Hx      Past Surgical History:  Procedure Laterality Date  . ABDOMINAL HYSTERECTOMY      Social History   Socioeconomic History  . Marital status: Divorced    Spouse name: Not on file  . Number of children: Not on file  . Years of education: Not on file  . Highest education level: Not on file  Occupational History  . Not on file  Social Needs  . Financial resource strain: Not on file  . Food insecurity:    Worry: Not on file    Inability: Not on file  . Transportation needs:    Medical: Not on file    Non-medical: Not on file  Tobacco Use  . Smoking status: Current Every Day Smoker    Packs/day: 0.50    Years: 35.00    Pack years: 17.50    Types: Cigarettes  . Smokeless tobacco:  Never Used  . Tobacco comment: 4-5 cigarettes per day 11.6.19   Substance and Sexual Activity  . Alcohol use: No    Alcohol/week: 0.0 standard drinks  . Drug use: No  . Sexual activity: Never    Comment: 1ST INTERCOURSE- 16, PARTNERS- 4  Lifestyle  . Physical activity:    Days per week: Not on file    Minutes per session: Not on file  . Stress: Not on file  Relationships  . Social connections:    Talks on phone: Not on file    Gets together: Not on file    Attends religious service: Not on file    Active member of club or organization: Not on file    Attends meetings of clubs or organizations: Not on file    Relationship status: Not on file  . Intimate partner violence:    Fear of current or ex partner: Not on file    Emotionally abused: Not on file    Physically abused: Not on file    Forced sexual activity: Not on file  Other Topics Concern  . Not on file  Social  History Narrative  . Not on file     Allergies  Allergen Reactions  . Codeine Nausea And Vomiting  . Erythromycin Nausea And Vomiting  . Ampicillin Nausea And Vomiting, Rash and Other (See Comments)    Pt states that rash was on her tongue.  Has patient had a PCN reaction causing immediate rash, facial/tongue/throat swelling, SOB or lightheadedness with hypotension yes Has patient had a PCN reaction causing severe rash involving mucus membranes or skin necrosis: no Has patient had a PCN reaction that required hospitalization yes Has patient had a PCN reaction occurring within the last 10 years: no If all of the above answers are "NO", then may proceed with Cephalosporin use.      Outpatient Medications Prior to Visit  Medication Sig Dispense Refill  . albuterol (PROVENTIL) (2.5 MG/3ML) 0.083% nebulizer solution Take 3 mLs (2.5 mg total) by nebulization every 4 (four) hours as needed for wheezing or shortness of breath. 75 mL 5  . albuterol (VENTOLIN HFA) 108 (90 Base) MCG/ACT inhaler Inhale 1-2 puffs into the lungs every 6 (six) hours as needed for wheezing or shortness of breath. 1 Inhaler 5  . ALPRAZolam (XANAX) 0.25 MG tablet Take 1 tablet (0.25 mg total) by mouth 2 (two) times daily as needed for anxiety. 20 tablet 0  . Cholecalciferol (VITAMIN D) 50 MCG (2000 UT) CAPS Take 1 capsule (2,000 Units total) by mouth daily. 30 capsule 5  . Fluticasone-Umeclidin-Vilant (TRELEGY ELLIPTA) 100-62.5-25 MCG/INH AEPB Inhale 1 puff into the lungs daily. 60 each 2   No facility-administered medications prior to visit.     Review of Systems  Constitutional: Negative for chills, fever, malaise/fatigue and weight loss.  HENT: Negative for hearing loss, sore throat and tinnitus.   Eyes: Negative for blurred vision and double vision.  Respiratory: Positive for cough, sputum production and shortness of breath. Negative for hemoptysis, wheezing and stridor.   Cardiovascular: Negative for chest pain,  palpitations, orthopnea, leg swelling and PND.  Gastrointestinal: Negative for abdominal pain, constipation, diarrhea, heartburn, nausea and vomiting.  Genitourinary: Negative for dysuria, hematuria and urgency.  Musculoskeletal: Negative for joint pain and myalgias.  Skin: Negative for itching and rash.  Neurological: Negative for dizziness, tingling, weakness and headaches.  Endo/Heme/Allergies: Negative for environmental allergies. Does not bruise/bleed easily.  Psychiatric/Behavioral: Negative for depression. The patient is not nervous/anxious  and does not have insomnia.   All other systems reviewed and are negative.    Objective:  Physical Exam Vitals signs reviewed.  Constitutional:      General: She is not in acute distress.    Appearance: She is well-developed.  HENT:     Head: Normocephalic and atraumatic.  Eyes:     General: No scleral icterus.    Conjunctiva/sclera: Conjunctivae normal.     Pupils: Pupils are equal, round, and reactive to light.  Neck:     Musculoskeletal: Neck supple.     Vascular: No JVD.     Trachea: No tracheal deviation.  Cardiovascular:     Rate and Rhythm: Normal rate and regular rhythm.     Heart sounds: Normal heart sounds. No murmur.  Pulmonary:     Effort: Pulmonary effort is normal. No tachypnea, accessory muscle usage or respiratory distress.     Breath sounds: Normal breath sounds. No stridor. No wheezing, rhonchi or rales.     Comments: Diminished breath sounds bilaterally. Abdominal:     General: Bowel sounds are normal. There is no distension.     Palpations: Abdomen is soft.     Tenderness: There is no abdominal tenderness.  Musculoskeletal:        General: No tenderness.  Lymphadenopathy:     Cervical: No cervical adenopathy.  Skin:    General: Skin is warm and dry.     Capillary Refill: Capillary refill takes less than 2 seconds.     Findings: No rash.  Neurological:     Mental Status: She is alert and oriented to person,  place, and time.  Psychiatric:        Behavior: Behavior normal.      Vitals:   10/24/18 1318  BP: 110/80  Pulse: 84  SpO2: 96%  Weight: 148 lb (67.1 kg)  Height: 5\' 2"  (1.575 m)   96% on RA BMI Readings from Last 3 Encounters:  10/24/18 27.07 kg/m  08/22/18 25.61 kg/m  07/18/18 25.26 kg/m   Wt Readings from Last 3 Encounters:  10/24/18 148 lb (67.1 kg)  08/22/18 140 lb (63.5 kg)  07/18/18 138 lb 1.3 oz (62.6 kg)     CBC    Component Value Date/Time   WBC 8.3 07/18/2018 1529   RBC 4.52 07/18/2018 1529   HGB 14.1 07/18/2018 1529   HCT 41.8 07/18/2018 1529   PLT 331.0 07/18/2018 1529   MCV 92.4 07/18/2018 1529   MCH 30.8 11/22/2016 0832   MCHC 33.7 07/18/2018 1529   RDW 13.3 07/18/2018 1529   LYMPHSABS 2.8 07/18/2018 1529   MONOABS 0.6 07/18/2018 1529   EOSABS 0.3 07/18/2018 1529   BASOSABS 0.1 07/18/2018 1529    Chest Imaging: 07/24/2018 -CT chest with evidence of mild upper lobe predominant emphysematous change..  Pulmonary Functions Testing Results: PFT Results Latest Ref Rng & Units 10/24/2018  FVC-Pre L 2.22  FVC-Predicted Pre % 74  FVC-Post L 2.15  FVC-Predicted Post % 72  Pre FEV1/FVC % % 52  Post FEV1/FCV % % 50  FEV1-Pre L 1.17  FEV1-Predicted Pre % 51  FEV1-Post L 1.08  DLCO UNC% % 73  DLCO COR %Predicted % 83  TLC L 6.19  TLC % Predicted % 130  RV % Predicted % 190    FeNO: None   Pathology: None   Echocardiogram: None   Heart Catheterization: None    Assessment & Plan:   COPD, severe (HCC)  Osteopenia of spine  DOE (dyspnea  on exertion)  Centrilobular emphysema ([place])  Discussion:  This is a 63 year old recently quit smoking, 2 days ago.  PFTs with severe COPD.  DEXA scan with osteopenia, -2.4 spine.  PFTs completed with FEV1 of 1.08 L, 47% predicted postbronchodilator.  Prior inspiratory flow rates of greater than 60 L checked on inhaler training device on last office visit.  We will recommend continuation of the  following: Continue Trelegy Continue vitamin D and calcium supplementation Patient needs flu vaccine She must remain abstinent from cigarette smoking She will need a repeat low-dose lung cancer screening CT in October 2020. We will get her an appointment to enroll in lung cancer screening program in October 2020.  Greater than 50% of this patient's 25-minute office visit was spent face-to-face counseling and reviewing her PFTs.  We also discussed the importance of abstinence for smoking cessation.  We also counseled on vitamin D and calcium supplementation as well as getting flu shot today.    Current Outpatient Medications:  .  albuterol (PROVENTIL) (2.5 MG/3ML) 0.083% nebulizer solution, Take 3 mLs (2.5 mg total) by nebulization every 4 (four) hours as needed for wheezing or shortness of breath., Disp: 75 mL, Rfl: 5 .  albuterol (VENTOLIN HFA) 108 (90 Base) MCG/ACT inhaler, Inhale 1-2 puffs into the lungs every 6 (six) hours as needed for wheezing or shortness of breath., Disp: 1 Inhaler, Rfl: 5 .  ALPRAZolam (XANAX) 0.25 MG tablet, Take 1 tablet (0.25 mg total) by mouth 2 (two) times daily as needed for anxiety., Disp: 20 tablet, Rfl: 0 .  Cholecalciferol (VITAMIN D) 50 MCG (2000 UT) CAPS, Take 1 capsule (2,000 Units total) by mouth daily., Disp: 30 capsule, Rfl: 5 .  Fluticasone-Umeclidin-Vilant (TRELEGY ELLIPTA) 100-62.5-25 MCG/INH AEPB, Inhale 1 puff into the lungs daily., Disp: 60 each, Rfl: 2   Garner Nash, DO [facility] 10/24/2018 1:40 PM

## 2018-11-16 ENCOUNTER — Telehealth: Payer: Self-pay

## 2018-11-16 DIAGNOSIS — Z720 Tobacco use: Secondary | ICD-10-CM

## 2018-11-16 NOTE — Telephone Encounter (Signed)
Order placed. Nothing further is needed at this time.

## 2018-11-20 ENCOUNTER — Telehealth: Payer: Self-pay | Admitting: Pulmonary Disease

## 2018-11-20 NOTE — Telephone Encounter (Signed)
Patient is returning call for [facility] for lung cancer screening. Will route to [facility]

## 2018-11-20 NOTE — Telephone Encounter (Signed)
Spoke with pt and advised that we will be contacting her close to 06/2019 to get started in lung cancer screening per Dr Valeta Harms. Pt verbalized understanding.  Nothing further needed at this time.

## 2019-02-13 NOTE — Progress Notes (Signed)
Virtual Visit via Telephone Note  I connected with Lauralee Evener on 02/14/19 at 10:30 AM EDT by telephone and verified that I am speaking with the correct person using two identifiers.   I discussed the limitations, risks, security and privacy concerns of performing an evaluation and management service by telephone and the availability of in person appointments. I also discussed with the patient that there may be a patient responsible charge related to this service. The patient expressed understanding and agreed to proceed.   History of Present Illness: 63 year old current every day smoker followed in our office for COPD  Smoking history: Current every day smoker.  Smoking 5 cigarettes a day. Maintenance: Trelegy Ellipta  Pt of Dr. Valeta Harms   Patient consented to consult via telephone: Yes People present and their role in pt care: Patient  Chief complaint: Wheezing   63 year old female current every day smoker (smoking 5 cigarettes a day) followed in our office for COPD.  Patient is maintained on Trelegy Ellipta.  Patient reports that for the last week she has had increased wheezing as well as shortness of breath.  She has been using her [place] rescue inhaler 6 times a day as well as her Saba nebulized treatments 3 times daily.  Patient reports that she continues to have a productive cough with slightly discolored yellow-tinged sputum.  She reports that her sputum production is at baseline.  She is not had any sort of recent antibiotics or steroids since last being seen in our office.  Patient reports that she has not been around anyone who has been sick.  She does go to the grocery store and does her [place] but wears a mask when she is doing those things.  Patient continues to smoke.  See smoking assessment and cessation counseling listed below.  Patient reports that the COVID-19 pandemic has increased her anxiety.  She is wondering if she can have a refill of her benzodiazepines  today.  I declined this.  I likely believe that her anxiety is due to her overuse of albuterol but she can follow-up with primary care has been managing her anxiety medications chronically to get a refill if they deem this is necessary.  MMRC - Breathlessness Score 3 - I stop for breath after walking about 100 yards or after a few minutes on level ground (isle at grocery store is 160ft)    Smoking assessment and cessation counseling  Patient currently smoking: 5 cigarettes a day  I have advised the patient to quit/stop smoking as soon as possible due to high risk for multiple medical problems.  It will also be very difficult for [place] to manage patient's  respiratory symptoms and status if we continue to expose her lungs to a known irritant.  We do not advise e-cigarettes as a form of stopping smoking.  Patient is not willing to quit smoking. Hasnt set quit date yet.   I have advised the patient that we can assist and have options of nicotine replacement therapy, provided smoking cessation education today, provided smoking cessation counseling, and provided cessation resources.  Patient is contemplating using nicotine replacement therapies to stop smoking.  Follow-up next office visit office visit for assessment of smoking cessation.  Smoking cessation counseling advised for: 6 min     Observations/Objective:  10/24/2018-pulmonary function test- FVC 2.22 (74% predicted), postbronchodilator ratio 50, postbronchodilator FEV1 1.08 (47% predicted), no bronchodilator response, DLCO 73 >>>Patient did take Ventolin at 8 AM on 10/24/2018 >>>Severe obstructive defect,  no significant bronchodilator response, mildly reduced DLCO, lung volumes hyperinflation and air trapping  07/24/2018-CT chest without contrast- upper lobe predominant moderate emphysematous changes with biapical pleural and subpleural scarring, no pleural effusions seen   No results found for: NITRICOXIDE    Assessment and  Plan:  Tobacco abuse Assessment: 5 cigarettes a day Patient is not interested in stopping smoking right now but will think about it Patient is interested in nicotine replacement  Plan: Discussed nicotine replacement with patient today at length Encourage patient to contact her office if and when she decides to stop smoking We are more than happy to support her 4-week follow-up with our office and we can readdress at that time  At 4-week follow-up we need to further assess and document patient's smoking history.  Anxiety Assessment: Patient reporting increased anxiety during the setting of COVID-19 pandemic Patient maintained on as needed Xanax, patient requesting refill today  Plan: Patient can follow-up with primary care regarding refill of antianxiety medications  COPD (chronic obstructive pulmonary disease) ([place]) Assessment: Current every day smoker mMRC 3 COPD Gold 3 based off of January/2020 pulmonary function test October/2019 CT chest shows moderate emphysema Maintained on Trelegy Ellipta Saba or nebulizer use 9 times a day Increased wheezing and shortness of breath for the last week  Plan: Doxycycline today Prednisone taper today Continue Trelegy Ellipta Emphasized again to the patient she needs to stop smoking Rescue inhaler or Saba nebulizer use every 6 hours as needed for shortness of breath or wheezing 4-week follow-up with our office to ensure symptom resolution Patient to contact our office sooner if symptoms do not improve    Follow Up Instructions:  Return in about 4 weeks (around 03/14/2019), or if symptoms worsen or fail to improve, for Follow up with Dr. Valeta Harms, Follow up with Wyn Quaker FNP-C.    I discussed the assessment and treatment plan with the patient. The patient was provided an opportunity to ask questions and all were answered. The patient agreed with the plan and demonstrated an understanding of the instructions.   The patient was  advised to call back or seek an in-person evaluation if the symptoms worsen or if the condition fails to improve as anticipated.  I provided 28 minutes of non-face-to-face time during this encounter.   Lauraine Rinne, NP

## 2019-02-14 ENCOUNTER — Ambulatory Visit (INDEPENDENT_AMBULATORY_CARE_PROVIDER_SITE_OTHER): Payer: 59 | Admitting: Pulmonary Disease

## 2019-02-14 ENCOUNTER — Encounter: Payer: Self-pay | Admitting: Pulmonary Disease

## 2019-02-14 ENCOUNTER — Other Ambulatory Visit: Payer: Self-pay

## 2019-02-14 DIAGNOSIS — F17210 Nicotine dependence, cigarettes, uncomplicated: Secondary | ICD-10-CM | POA: Diagnosis not present

## 2019-02-14 DIAGNOSIS — J441 Chronic obstructive pulmonary disease with (acute) exacerbation: Secondary | ICD-10-CM

## 2019-02-14 DIAGNOSIS — Z720 Tobacco use: Secondary | ICD-10-CM

## 2019-02-14 DIAGNOSIS — F419 Anxiety disorder, unspecified: Secondary | ICD-10-CM

## 2019-02-14 MED ORDER — DOXYCYCLINE HYCLATE 100 MG PO TABS: 100.0000 mg | ORAL_TABLET | Freq: Two times a day (BID) | ORAL | 0 refills | Status: DC

## 2019-02-14 MED ORDER — PREDNISONE 10 MG PO TABS: ORAL_TABLET | ORAL | 0 refills | Status: DC

## 2019-02-14 MED ORDER — ALBUTEROL SULFATE HFA 108 (90 BASE) MCG/ACT IN AERS: 1.0000 | INHALATION_SPRAY | Freq: Four times a day (QID) | RESPIRATORY_TRACT | 5 refills | Status: DC | PRN

## 2019-02-14 NOTE — Assessment & Plan Note (Signed)
Assessment: 5 cigarettes a day Patient is not interested in stopping smoking right now but will think about it Patient is interested in nicotine replacement  Plan: Discussed nicotine replacement with patient today at length Encourage patient to contact her office if and when she decides to stop smoking We are more than happy to support her 4-week follow-up with our office and we can readdress at that time  At 4-week follow-up we need to further assess and document patient's smoking history.

## 2019-02-14 NOTE — Assessment & Plan Note (Addendum)
Assessment: Current every day smoker mMRC 3 COPD Gold 3 based off of January/2020 pulmonary function test October/2019 CT chest shows moderate emphysema Maintained on Trelegy Ellipta Saba or nebulizer use 9 times a day Increased wheezing and shortness of breath for the last week  Plan: Doxycycline today Prednisone taper today Continue Trelegy Ellipta Emphasized again to the patient she needs to stop smoking Rescue inhaler or Saba nebulizer use every 6 hours as needed for shortness of breath or wheezing 4-week follow-up with our office to ensure symptom resolution Patient to contact our office sooner if symptoms do not improve

## 2019-02-14 NOTE — Assessment & Plan Note (Signed)
Assessment: Patient reporting increased anxiety during the setting of COVID-19 pandemic Patient maintained on as needed Xanax, patient requesting refill today  Plan: Patient can follow-up with primary care regarding refill of antianxiety medications

## 2019-02-14 NOTE — Patient Instructions (Addendum)
Prednisone 10mg  tablet  >>>4 tabs for 2 days, then 3 tabs for 2 days, 2 tabs for 2 days, then 1 tab for 2 days, then stop >>>take with food  >>>take in the morning   Doxycycline >>> 1 100 mg tablet every 12 hours for 7 days >>>take with food  >>>wear sunscreen   Continue Trelegy Ellipta  >>> 1 puff daily in the morning >>>rinse mouth out after use  >>> This inhaler contains 3 medications that help manage her respiratory status, contact our office if you cannot afford this medication or unable to remain on this medication  Only use your albuterol as a rescue medication to be used if you can't catch your breath by resting or doing a relaxed purse lip breathing pattern.  - The less you use it, the better it will work when you need it. - Ok to use up to 2 puffs  every 4 hours if you must but call for immediate appointment if use goes up over your usual need - Don't leave home without it !!  (think of it like the spare tire for your car)   Can use your nebulizer every 6 hours as needed for shortness of breath      We recommend that you stop smoking.  >>>You need to set a quit date >>>If you have friends or family who smoke, let them know you are trying to quit and not to smoke around you or in your living environment  Smoking Cessation Resources:  1 800 QUIT NOW  >>> Patient to call this resource and utilize it to help support her quit smoking >>> Keep up your hard work with stopping smoking  You can also contact the [facility] >>>For smoking cessation classes call [phone]  We do not recommend using e-cigarettes as a form of stopping smoking  You can sign up for smoking cessation support texts and information:  >>>https://smokefree.gov/smokefreetxt   Contact primary care for refills of your antianxiety medications if you feel that you need those    Return in about 4 weeks (around 03/14/2019), or if symptoms worsen or fail to improve, for Follow up with Dr.  Valeta Harms, Follow up with Wyn Quaker FNP-C.    Coronavirus (COVID-19) Are you at risk?  Are you at risk for the Coronavirus (COVID-19)?  To be considered HIGH RISK for Coronavirus (COVID-19), you have to meet the following criteria:  . Traveled to [place], [place], [place], [place] or [place]; or in the [place] to [place], [place], [place], or [place]; and have fever, cough, and shortness of breath within the last 2 weeks of travel OR . Been in close contact with a person diagnosed with COVID-19 within the last 2 weeks and have fever, cough, and shortness of breath . IF YOU DO NOT MEET THESE CRITERIA, YOU ARE CONSIDERED LOW RISK FOR COVID-19.  What to do if you are HIGH RISK for COVID-19?  Marland Kitchen If you are having a medical emergency, call 911. . Seek medical care right away. Before you go to a doctor's office, urgent care or emergency department, call ahead and tell them about your recent travel, contact with someone diagnosed with COVID-19, and your symptoms. You should receive instructions from your physician's office regarding next steps of care.  . When you arrive at healthcare provider, tell the healthcare staff immediately you have returned from visiting [place], [place], [place], [place] or [place]; or traveled in the [place] to [place], [place],  [place], or [place]; in the last two weeks or you have been in close contact with a person diagnosed with COVID-19 in the last 2 weeks.   . Tell the health care staff about your symptoms: fever, cough and shortness of breath. . After you have been seen by a medical provider, you will be either: o Tested for (COVID-19) and discharged home on quarantine except to seek medical care if symptoms worsen, and asked to  - Stay home and avoid contact with others until you get your results (4-5 days)  - Avoid travel on public transportation if possible (such as bus, train, or airplane) or o Sent to the Emergency Department by EMS for  evaluation, COVID-19 testing, and possible admission depending on your condition and test results.  What to do if you are LOW RISK for COVID-19?  Reduce your risk of any infection by using the same precautions used for avoiding the common cold or flu:  Marland Kitchen Wash your hands often with soap and warm water for at least 20 seconds.  If soap and water are not readily available, use an alcohol-based hand sanitizer with at least 60% alcohol.  . If coughing or sneezing, cover your mouth and nose by coughing or sneezing into the elbow areas of your shirt or coat, into a tissue or into your sleeve (not your hands). . Avoid shaking hands with others and consider head nods or verbal greetings only. . Avoid touching your eyes, nose, or mouth with unwashed hands.  . Avoid close contact with people who are sick. . Avoid places or events with large numbers of people in one location, like concerts or sporting events. . Carefully consider travel plans you have or are making. . If you are planning any travel outside or inside the [place], visit the CDC's Travelers' Health webpage for the latest health notices. . If you have some symptoms but not all symptoms, continue to monitor at home and seek medical attention if your symptoms worsen. . If you are having a medical emergency, call 911.   [place] / e-Visit: [email or web address]         [facility]: [facility]: [phone]                   [facility]: [phone]           It is flu season:   >>> Best ways to protect herself from the flu: Receive the yearly flu vaccine, practice good hand hygiene washing with soap and also using hand sanitizer when available, eat a nutritious meals, get adequate rest, hydrate appropriately   Please contact the office if your symptoms worsen or you have concerns that  you are not improving.   Thank you for choosing [facility] for your healthcare, and for allowing [place] to partner with you on your healthcare journey. I am thankful to be able to provide care to you today.   Wyn Quaker FNP-C     Chronic Obstructive Pulmonary Disease Exacerbation Chronic obstructive pulmonary disease (COPD) is a long-term (chronic) lung problem. In COPD, the flow of air from the lungs is limited. COPD exacerbations are times that breathing gets worse and you need more than your normal treatment. Without treatment, they can be life threatening. If they happen often, your lungs can become more damaged. If your COPD gets worse, your doctor may treat you with:  Medicines.  Oxygen.  Different ways to clear your airway, such as using a mask. Follow these instructions at home: Medicines  Take over-the-counter and prescription medicines only as told by your doctor.  If you take an antibiotic or steroid medicine, do not stop taking the medicine even if you start to feel better.  Keep up with shots (vaccinations) as told by your doctor. Be sure to get a yearly (annual) flu shot. Lifestyle  Do not smoke. If you need help quitting, ask your doctor.  Eat healthy foods.  Exercise regularly.  Get plenty of sleep.  Avoid tobacco smoke and other things that can bother your lungs.  Wash your hands often with soap and water. This will help keep you from getting an infection. If you cannot use soap and water, use hand sanitizer.  During flu season, avoid areas that are crowded with people. General instructions  Drink enough fluid to keep your pee (urine) clear or pale yellow. Do not do this if your doctor has told you not to.  Use a cool mist machine (vaporizer).  If you use oxygen or a machine that turns medicine into a mist (nebulizer), continue to use it as told.  Follow all instructions for rehabilitation. These are steps you can take to make your body work  better.  Keep all follow-up visits as told by your doctor. This is important. Contact a doctor if:  Your COPD symptoms get worse than normal. Get help right away if:  You are short of breath and it gets worse.  You have trouble talking.  You have chest pain.  You cough up blood.  You have a fever.  You keep throwing up (vomiting).  You feel weak or you pass out (faint).  You feel confused.  You are not able to sleep because of your symptoms.  You are not able to do daily activities. Summary  COPD exacerbations are times that breathing gets worse and you need more treatment than normal.  COPD exacerbations can be very serious and may cause your lungs to become more damaged.  Do not smoke. If you need help quitting, ask your doctor.  Stay up-to-date on your shots. Get a flu shot every year. This information is not intended to replace advice given to you by your health care provider. Make sure you discuss any questions you have with your health care provider. Document Released: 09/22/2011 Document Revised: 11/07/2016 Document Reviewed: 11/07/2016 Elsevier Interactive Patient Education  2019 [place] Risks of Smoking Smoking cigarettes is very bad for your health. Tobacco smoke has over 200 known poisons in it. It contains the poisonous gases nitrogen oxide and carbon monoxide. There are over 60 chemicals in tobacco smoke that cause cancer. Smoking is difficult to quit because a chemical in tobacco, called nicotine, causes addiction or dependence. When you smoke and inhale, nicotine is absorbed rapidly into the bloodstream through your lungs. Both inhaled and non-inhaled nicotine may be addictive. What are the risks of cigarette smoke? Cigarette smokers have an increased risk of many serious medical problems, including:  Lung cancer.  Lung disease, such as pneumonia, bronchitis, and emphysema.  Chest pain (angina) and heart attack because the heart  is not getting enough oxygen.  Heart disease and peripheral blood vessel disease.  High blood pressure (hypertension).  Stroke.  Oral cancer, including cancer of the lip, mouth, or voice box.  Bladder cancer.  Pancreatic cancer.  Cervical cancer.  Pregnancy complications, including premature birth.  Stillbirths and smaller newborn babies, birth defects, and genetic damage to sperm.  Early menopause.  Lower estrogen level for women.  Infertility.  Facial wrinkles.  Blindness.  Increased risk of broken bones (fractures).  Senile dementia.  Stomach ulcers and internal bleeding.  Delayed wound healing and increased risk of complications during surgery.  Even smoking lightly shortens your life expectancy by several years. Because of secondhand smoke exposure, children of smokers have an increased risk of the following:  Sudden infant death syndrome (SIDS).  Respiratory infections.  Lung cancer.  Heart disease.  Ear infections. What are the benefits of quitting? There are many health benefits of quitting smoking. Here are some of them:  Within days of quitting smoking, your risk of having a heart attack decreases, your blood flow improves, and your lung capacity improves. Blood pressure, pulse rate, and breathing patterns start returning to normal soon after quitting.  Within months, your lungs may clear up completely.  Quitting for 10 years reduces your risk of developing lung cancer and heart disease to almost that of a nonsmoker.  People who quit may see an improvement in their overall quality of life. How do I quit smoking?     Smoking is an addiction with both physical and psychological effects, and longtime habits can be hard to change. Your health care provider can recommend:  Programs and community resources, which may include group support, education, or talk therapy.  Prescription medicines to help reduce cravings.  Nicotine replacement  products, such as patches, gum, and nasal sprays. Use these products only as directed. Do not replace cigarette smoking with electronic cigarettes, which are commonly called e-cigarettes. The safety of e-cigarettes is not known, and some may contain harmful chemicals.  A combination of two or more of these methods. Where to find more information  American Lung Association: www.lung.org  American Cancer Society: www.cancer.org Summary  Smoking cigarettes is very bad for your health. Cigarette smokers have an increased risk of many serious medical problems, including several cancers, heart disease, and stroke.  Smoking is an addiction with both physical and psychological effects, and longtime habits can be hard to change.  By stopping right away, you can greatly reduce the risk of medical problems for you and your family.  To help you quit smoking, your health care provider can recommend programs, community resources, prescription medicines, and nicotine replacement products such as patches, gum, and nasal sprays. This information is not intended to replace advice given to you by your health care provider. Make sure you discuss any questions you have with your health care provider. Document Released: 11/10/2004 Document Revised: 01/04/2018 Document Reviewed: 10/07/2016 Elsevier Interactive Patient Education  2019 [employer].

## 2019-02-15 NOTE — Progress Notes (Signed)
PCCM: Agree. She needs to stop smoking. Thanks for calling her.  [facility] 02/15/2019 9:02 AM

## 2019-03-17 NOTE — Progress Notes (Addendum)
@Patient  ID: Lynn Kline, female    DOB: 06-Mar-1956, 63 y.o.   MRN: [id number]  Chief Complaint  Patient presents with   Follow-up    some improvement - completed doxy and pred    Referring provider: Janith Lima, MD  HPI:  63 year old current every day smoker followed in our office for COPD  Smoking history: Current every day smoker.  Smoking 5 cigarettes a day. Maintenance: Trelegy Ellipta  Pt of Dr. Valeta Harms   06/20/2019  - Visit   63 year old female current every day smoker presenting to our office for a 4-week follow-up after being treated via a tele-visit as a COPD exacerbation with doxycycline and prednisone.  Patient feels that her symptoms have improved since empiric treatment with doxycycline and prednisone.  Patient is still having a productive cough with white mucus.  Patient is still having anxiety but is no longer managed on Xanax as she has not followed up with her primary care as instructed.  Patient reports that she is using her rescue inhaler between 1-5 times daily as well as her albuterol once daily.  Patient continues to be managed on Trelegy Ellipta.  Patient continues to smoke about 4 to 5 cigarettes a day.  She is not interested in stopping smoking this time.  She says that she will consider it.  She already has nicotine replacement therapies at home.  MMRC - Breathlessness Score 3 - I stop for breath after walking about 100 yards or after a few minutes on level ground (isle at grocery store is 168ft)    Tests:   10/24/2018-pulmonary function test- FVC 2.22 (74% predicted), postbronchodilator ratio 50, postbronchodilator FEV1 1.08 (47% predicted), no bronchodilator response, DLCO 73 >>>Patient did take Ventolin at 8 AM on 10/24/2018 >>>Severe obstructive defect, no significant bronchodilator response, mildly reduced DLCO, lung volumes hyperinflation and air trapping  07/24/2018-CT chest without contrast- upper lobe predominant moderate emphysematous changes  with biapical pleural and subpleural scarring, no pleural effusions seen  SIX MIN WALK 03/18/2019  Supplimental Oxygen during Test? (L/min) No  Tech Comments: Good walking pace -no desaturation.  c/o mild SOB at end of walk but able to complete w/o difficulty. Joella Prince RN    FENO:  No results found for: NITRICOXIDE  PFT: PFT Results Latest Ref Rng & Units 10/24/2018  FVC-Pre L 2.22  FVC-Predicted Pre % 74  FVC-Post L 2.15  FVC-Predicted Post % 72  Pre FEV1/FVC % % 52  Post FEV1/FCV % % 50  FEV1-Pre L 1.17  FEV1-Predicted Pre % 51  FEV1-Post L 1.08  DLCO UNC% % 73  DLCO COR %Predicted % 83  TLC L 6.19  TLC % Predicted % 130  RV % Predicted % 190    Imaging: No results found.    Specialty Problems      Pulmonary Problems   COPD (chronic obstructive pulmonary disease) (HCC)    10/24/2018-pulmonary function test- FVC 2.22 (74% predicted), postbronchodilator ratio 50, postbronchodilator FEV1 1.08 (47% predicted), no bronchodilator response, DLCO 73 >>>Patient did take Ventolin at 8 AM on 10/24/2018 >>>Severe obstructive defect, no significant bronchodilator response, mildly reduced DLCO, lung volumes hyperinflation and air trapping  07/24/2018-CT chest without contrast- upper lobe predominant moderate emphysematous changes with biapical pleural and subpleural scarring, no pleural effusions seen      Viral pleurisy      Allergies  Allergen Reactions   Codeine Nausea And Vomiting   Erythromycin Nausea And Vomiting   Ampicillin Nausea And Vomiting,  Rash and Other (See Comments)    Pt states that rash was on her tongue.  Has patient had a PCN reaction causing immediate rash, facial/tongue/throat swelling, SOB or lightheadedness with hypotension yes Has patient had a PCN reaction causing severe rash involving mucus membranes or skin necrosis: no Has patient had a PCN reaction that required hospitalization yes Has patient had a PCN reaction occurring within the last 10  years: no If all of the above answers are "NO", then may proceed with Cephalosporin use.     Immunization History  Administered Date(s) Administered   DTaP 06/14/2005   Influenza Whole 10/23/2011   Influenza,inj,Quad PF,6+ Mos 08/19/2014, 10/24/2018   Pneumococcal Conjugate-13 09/14/2005   Pneumococcal Polysaccharide-23 08/19/2014    Past Medical History:  Diagnosis Date   Anxiety    Atherosclerosis    Colon polyp, hyperplastic    COPD (chronic obstructive pulmonary disease) (HCC)    Diverticulosis    Gallstone     Tobacco History: Social History   Tobacco Use  Smoking Status Current Every Day Smoker   Packs/day: 0.50   Years: 35.00   Pack years: 17.50   Types: Cigarettes  Smokeless Tobacco Never Used  Tobacco Comment   4-5 cigarettes per day 03/18/19   Ready to quit: No Counseling given: Yes Comment: 4-5 cigarettes per day 03/18/19  Smoking assessment and cessation counseling  Patient currently smoking: 4 to 5 cigarettes a day I have advised the patient to quit/stop smoking as soon as possible due to high risk for multiple medical problems.  It will also be very difficult for [place] to manage patient's  respiratory symptoms and status if we continue to expose her lungs to a known irritant.  We do not advise e-cigarettes as a form of stopping smoking.  Patient is not willing to quit smoking.  She reports she will think about it.  She has not set a quit date.  I have advised the patient that we can assist and have options of nicotine replacement therapy, provided smoking cessation education today, provided smoking cessation counseling, and provided cessation resources.  Patient has nicotine replacement therapies at home.  She reports that she has lozenges as well as patches.  She will consider starting reduced to quit method supplementing with lozenges to replace cigarettes to get to 0 cigarettes daily within the next month.  Follow-up next office visit office  visit for assessment of smoking cessation.  Smoking cessation counseling advised for: 49min    Outpatient Encounter Medications as of 03/18/2019  Medication Sig   albuterol (PROVENTIL) (2.5 MG/3ML) 0.083% nebulizer solution Take 3 mLs (2.5 mg total) by nebulization every 4 (four) hours as needed for wheezing or shortness of breath.   albuterol (VENTOLIN HFA) 108 (90 Base) MCG/ACT inhaler Inhale 1-2 puffs into the lungs every 6 (six) hours as needed for wheezing or shortness of breath.   calcium-vitamin D 250-100 MG-UNIT tablet Take 1 tablet by mouth daily.   Cholecalciferol (VITAMIN D) 50 MCG (2000 UT) CAPS Take 1 capsule (2,000 Units total) by mouth daily.   Fluticasone-Umeclidin-Vilant (TRELEGY ELLIPTA) 100-62.5-25 MCG/INH AEPB Inhale 1 puff into the lungs daily.   [DISCONTINUED] doxycycline (VIBRA-TABS) 100 MG tablet Take 1 tablet (100 mg total) by mouth 2 (two) times daily.   [DISCONTINUED] predniSONE (DELTASONE) 10 MG tablet 4 tabs for 2 days, then 3 tabs for 2 days, 2 tabs for 2 days, then 1 tab for 2 days, then stop   ALPRAZolam (XANAX) 0.25 MG tablet Take 1  tablet (0.25 mg total) by mouth 2 (two) times daily as needed for anxiety. (Patient not taking: Reported on 03/18/2019)   benzonatate (TESSALON) 200 MG capsule Take 1 capsule (200 mg total) by mouth 3 (three) times daily as needed for cough.   [EXPIRED] Fluticasone-Umeclidin-Vilant (TRELEGY ELLIPTA) 100-62.5-25 MCG/INH AEPB Inhale 1 puff into the lungs daily for 1 day.   predniSONE (DELTASONE) 10 MG tablet Take 2 tablets (20mg  total) daily for the next 5 days. Take in the AM with food.   No facility-administered encounter medications on file as of 03/18/2019.      Review of Systems  Review of Systems  Constitutional: Positive for fatigue. Negative for chills, fever and unexpected weight change.  HENT: Positive for trouble swallowing. Negative for congestion, ear pain, sinus pressure and sinus pain.   Respiratory: Positive  for cough (not always productive ), shortness of breath and wheezing. Negative for chest tightness.   Cardiovascular: Positive for chest pain. Negative for palpitations.  Gastrointestinal: Negative for diarrhea, nausea and vomiting.  Genitourinary: Negative for dysuria, frequency and urgency.  Musculoskeletal: Negative for arthralgias.  Skin: Negative for color change.  Allergic/Immunologic: Negative for environmental allergies and food allergies.  Neurological: Negative for dizziness, light-headedness and headaches.  Psychiatric/Behavioral: Negative for dysphoric mood. The patient is nervous/anxious.   All other systems reviewed and are negative.    Physical Exam  BP 116/76 (BP Location: Left Arm, Patient Position: Sitting, Cuff Size: Normal)    Pulse 81    Temp (!) 97.5 F (36.4 C)    Ht 5\' 2"  (1.575 m)    Wt 155 lb 6.4 oz (70.5 kg)    SpO2 99%    BMI 28.42 kg/m   Wt Readings from Last 5 Encounters:  03/18/19 155 lb 6.4 oz (70.5 kg)  10/24/18 148 lb (67.1 kg)  08/22/18 140 lb (63.5 kg)  07/18/18 138 lb 1.3 oz (62.6 kg)  11/29/16 145 lb 1.3 oz (65.8 kg)     Physical Exam  Constitutional: She is oriented to person, place, and time and well-developed, well-nourished, and in no distress. No distress.  HENT:  Head: Normocephalic and atraumatic.  Right Ear: Hearing, tympanic membrane, external ear and ear canal normal.  Left Ear: Hearing, tympanic membrane, external ear and ear canal normal.  Mouth/Throat: Uvula is midline and oropharynx is clear and moist. No oropharyngeal exudate.  Mallampati 1, no visual masses  Eyes: Pupils are equal, round, and reactive to light.  Neck: Normal range of motion. Neck supple. No thyroid mass and no thyromegaly present.  Cardiovascular: Normal rate, regular rhythm and normal heart sounds.  Pulmonary/Chest: Effort normal. No accessory muscle usage. No respiratory distress. She has no decreased breath sounds. She has wheezes (Expiratory wheeze). She  has no rhonchi.  Abdominal: Soft. Bowel sounds are normal. She exhibits no distension. There is no abdominal tenderness.  Musculoskeletal: Normal range of motion.        General: No edema.  Lymphadenopathy:    She has no cervical adenopathy.  Neurological: She is alert and oriented to person, place, and time. Gait normal.  Skin: Skin is warm and dry. She is not diaphoretic. No erythema.  Psychiatric: Mood, memory, affect and judgment normal.  Nursing note and vitals reviewed.     Lab Results:  CBC    Component Value Date/Time   WBC 8.3 07/18/2018 1529   RBC 4.52 07/18/2018 1529   HGB 14.1 07/18/2018 1529   HCT 41.8 07/18/2018 1529   PLT 331.0 07/18/2018  1529   MCV 92.4 07/18/2018 1529   MCH 30.8 11/22/2016 0832   MCHC 33.7 07/18/2018 1529   RDW 13.3 07/18/2018 1529   LYMPHSABS 2.8 07/18/2018 1529   MONOABS 0.6 07/18/2018 1529   EOSABS 0.3 07/18/2018 1529   BASOSABS 0.1 07/18/2018 1529    BMET    Component Value Date/Time   NA 139 07/18/2018 1529   K 4.2 07/18/2018 1529   CL 102 07/18/2018 1529   CO2 32 07/18/2018 1529   GLUCOSE 95 07/18/2018 1529   BUN 15 07/18/2018 1529   CREATININE 0.77 07/18/2018 1529   CALCIUM 9.7 07/18/2018 1529   GFRNONAA >60 11/22/2016 0832   GFRAA >60 11/22/2016 0832    BNP    Component Value Date/Time   BNP 34.1 11/22/2016 0832    ProBNP    Component Value Date/Time   PROBNP 25.0 07/18/2018 1529      Assessment & Plan:   COPD (chronic obstructive pulmonary disease) (HCC) Assessment: Current every day smoker, 4 to 5 cigarettes daily mMRC 3 today COPD Gold 3 based off of January/2020 pulmonary function test October/2019 CT chest shows moderate emphysema Maintained on Trelegy Ellipta Using rescue inhaler maybe 1 time daily Expiratory wheeze on exam today Improved clinical symptoms since empiric doxycycline therapy and prednisone taper Tolerated walk in office today  Plan: Slow to resolve COPD exacerbation Will give  short course of prednisone today Continue Trelegy Ellipta Emphasized again to the patient she needs to stop smoking Use albuterol nebs at least twice daily for the next 5 days then use every 8 hours as needed Continue rescue inhaler as needed Follow-up in 4 months   Dysphagia, pharyngoesophageal phase Assessment: History of esophageal stretching Upper endoscopy in 2015 Patient reports occasional bouts of dysphasia over the last 2 to 3 weeks  Plan: Referral back to gastroenterology  Tobacco abuse Assessment: Smoking 5 cigarettes a day Patient is not interested in stopping smoking right now She has nicotine replacement therapies at home  Plan: Discussed nicotine replacement therapy with patient today She can do a reduced to quit method to supplementing a cigarette with nicotine lozenge over the next month to hopefully get down to 0 cigarettes a month She reports she will think about starting this plan Follow-up with our office in 3 to 4 months  Anxiety Plan: Follow-up with primary care regarding antianxiety medications and your need for refill    Return in about 4 months (around 07/18/2019), or if symptoms worsen or fail to improve, for Follow up with Wyn Quaker FNP-C, Follow up with Dr. Valeta Harms.   Lauraine Rinne, NP 06/20/2019   This appointment was 25 minutes long with over 50% of the time in direct face-to-face patient care, assessment, plan of care, and follow-up.

## 2019-03-18 ENCOUNTER — Encounter: Payer: Self-pay | Admitting: Pulmonary Disease

## 2019-03-18 ENCOUNTER — Ambulatory Visit: Payer: 59 | Admitting: Pulmonary Disease

## 2019-03-18 ENCOUNTER — Other Ambulatory Visit: Payer: Self-pay

## 2019-03-18 VITALS — BP 116/76 | HR 81 | Temp 97.5°F | Ht 62.0 in | Wt 155.4 lb

## 2019-03-18 DIAGNOSIS — Z720 Tobacco use: Secondary | ICD-10-CM

## 2019-03-18 DIAGNOSIS — R1314 Dysphagia, pharyngoesophageal phase: Secondary | ICD-10-CM

## 2019-03-18 DIAGNOSIS — J441 Chronic obstructive pulmonary disease with (acute) exacerbation: Secondary | ICD-10-CM | POA: Diagnosis not present

## 2019-03-18 DIAGNOSIS — F17210 Nicotine dependence, cigarettes, uncomplicated: Secondary | ICD-10-CM | POA: Diagnosis not present

## 2019-03-18 DIAGNOSIS — F419 Anxiety disorder, unspecified: Secondary | ICD-10-CM | POA: Diagnosis not present

## 2019-03-18 MED ORDER — PREDNISONE 10 MG PO TABS: ORAL_TABLET | ORAL | 0 refills | Status: DC

## 2019-03-18 MED ORDER — BENZONATATE 200 MG PO CAPS: 200.0000 mg | ORAL_CAPSULE | Freq: Three times a day (TID) | ORAL | 1 refills | Status: DC | PRN

## 2019-03-18 MED ORDER — FLUTICASONE-UMECLIDIN-VILANT 100-62.5-25 MCG/INH IN AEPB: 1.0000 | INHALATION_SPRAY | Freq: Every day | RESPIRATORY_TRACT | 0 refills | Status: AC

## 2019-03-18 NOTE — Assessment & Plan Note (Addendum)
Assessment: Current every day smoker, 4 to 5 cigarettes daily mMRC 3 today COPD Gold 3 based off of January/2020 pulmonary function test October/2019 CT chest shows moderate emphysema Maintained on Trelegy Ellipta Using rescue inhaler maybe 1 time daily Expiratory wheeze on exam today Improved clinical symptoms since empiric doxycycline therapy and prednisone taper Tolerated walk in office today  Plan: Slow to resolve COPD exacerbation Will give short course of prednisone today Continue Trelegy Ellipta Emphasized again to the patient she needs to stop smoking Use albuterol nebs at least twice daily for the next 5 days then use every 8 hours as needed Continue rescue inhaler as needed Follow-up in 4 months

## 2019-03-18 NOTE — Patient Instructions (Addendum)
Prednisone 10mg  tablet  >>>Take 2 tablets (20 mg total) daily for the next 5 days >>> Take with food in the morning  Use albuterol nebulizers 2x daily for next 5 days   Walk in office today   Referred to GI today   Continue Trelegy Ellipta  >>> 1 puff daily in the morning >>>rinse mouth out after use  >>> This inhaler contains 3 medications that help manage her respiratory status, contact our office if you cannot afford this medication or unable to remain on this medication >>>Sample provided today   Only use your albuterol as a rescue medication to be used if you can't catch your breath by resting or doing a relaxed purse lip breathing pattern.  - The less you use it, the better it will work when you need it. - Ok to use up to 2 puffs every 4 hours if you must but call for immediate appointment if use goes up over your usual need - Don't leave home without it !! (think of it like the spare tire for your car)   Can use your nebulizer every 6 hours as needed for shortness of breath      We recommend that you stop smoking.  >>>You need to set a quit date >>>If you have friends or family who smoke, let them know you are trying to quit and not to smoke around you or in your living environment  Smoking Cessation Resources:  1 800 QUIT NOW  >>> Patient to call this resource and utilize it to help support her quit smoking >>> Keep up your hard work with stopping smoking  You can also contact the [facility] >>>For smoking cessation classes call [phone]  We do not recommend using e-cigarettes as a form of stopping smoking  You can sign up for smoking cessation support texts and information:  >>>https://smokefree.gov/smokefreetxt   Contact primary care for refills of your antianxiety medications if you feel that you need those   Return in about 4 months (around 07/18/2019), or if symptoms worsen or fail to improve, for Follow up with Wyn Quaker  FNP-C, Follow up with Dr. Valeta Harms.    Coronavirus (COVID-19) Are you at risk?  Are you at risk for the Coronavirus (COVID-19)?  To be considered HIGH RISK for Coronavirus (COVID-19), you have to meet the following criteria:  . Traveled to [place], [place], [place], [place] or [place]; or in the [place] to [place], [place], [place], or [place]; and have fever, cough, and shortness of breath within the last 2 weeks of travel OR . Been in close contact with a person diagnosed with COVID-19 within the last 2 weeks and have fever, cough, and shortness of breath . IF YOU DO NOT MEET THESE CRITERIA, YOU ARE CONSIDERED LOW RISK FOR COVID-19.  What to do if you are HIGH RISK for COVID-19?  Marland Kitchen If you are having a medical emergency, call 911. . Seek medical care right away. Before you go to a doctor's office, urgent care or emergency department, call ahead and tell them about your recent travel, contact with someone diagnosed with COVID-19, and your symptoms. You should receive instructions from your physician's office regarding next steps of care.  . When you arrive at healthcare provider, tell the healthcare staff immediately you have returned from visiting [place], [place], [place], [place] or [place]; or traveled in the [place] to [place], [place], [place], or [place]; in the last two weeks  or you have been in close contact with a person diagnosed with COVID-19 in the last 2 weeks.   . Tell the health care staff about your symptoms: fever, cough and shortness of breath. . After you have been seen by a medical provider, you will be either: o Tested for (COVID-19) and discharged home on quarantine except to seek medical care if symptoms worsen, and asked to  - Stay home and avoid contact with others until you get your results (4-5 days)  - Avoid travel on public transportation if possible (such as bus, train, or airplane) or o Sent to the Emergency Department by EMS for  evaluation, COVID-19 testing, and possible admission depending on your condition and test results.  What to do if you are LOW RISK for COVID-19?  Reduce your risk of any infection by using the same precautions used for avoiding the common cold or flu:  Marland Kitchen Wash your hands often with soap and warm water for at least 20 seconds.  If soap and water are not readily available, use an alcohol-based hand sanitizer with at least 60% alcohol.  . If coughing or sneezing, cover your mouth and nose by coughing or sneezing into the elbow areas of your shirt or coat, into a tissue or into your sleeve (not your hands). . Avoid shaking hands with others and consider head nods or verbal greetings only. . Avoid touching your eyes, nose, or mouth with unwashed hands.  . Avoid close contact with people who are sick. . Avoid places or events with large numbers of people in one location, like concerts or sporting events. . Carefully consider travel plans you have or are making. . If you are planning any travel outside or inside the [place], visit the CDC's Travelers' Health webpage for the latest health notices. . If you have some symptoms but not all symptoms, continue to monitor at home and seek medical attention if your symptoms worsen. . If you are having a medical emergency, call 911.   [place] / e-Visit: [email or web address]         [facility]: [facility]: [phone]                   [facility]: [phone]           It is flu season:   >>> Best ways to protect herself from the flu: Receive the yearly flu vaccine, practice good hand hygiene washing with soap and also using hand sanitizer when available, eat a nutritious meals, get adequate rest, hydrate appropriately   Please contact the office if your symptoms worsen or you have concerns that  you are not improving.   Thank you for choosing [facility] for your healthcare, and for allowing [place] to partner with you on your healthcare journey. I am thankful to be able to provide care to you today.   Wyn Quaker FNP-C     Chronic Obstructive Pulmonary Disease Chronic obstructive pulmonary disease (COPD) is a long-term (chronic) lung problem. When you have COPD, it is hard for air to get in and out of your lungs. Usually the condition gets worse over time, and your lungs will never return to normal. There are things you can do to keep yourself as healthy as possible.  Your doctor may treat your condition with: ? Medicines. ? Oxygen. ? Lung surgery.  Your doctor may also recommend: ? Rehabilitation.  This includes steps to make your body work better. It may involve a team of specialists. ? Quitting smoking, if you smoke. ? Exercise and changes to your diet. ? Comfort measures (palliative care). Follow these instructions at home: Medicines  Take over-the-counter and prescription medicines only as told by your doctor.  Talk to your doctor before taking any cough or allergy medicines. You may need to avoid medicines that cause your lungs to be dry. Lifestyle  If you smoke, stop. Smoking makes the problem worse. If you need help quitting, ask your doctor.  Avoid being around things that make your breathing worse. This may include smoke, chemicals, and fumes.  Stay active, but remember to rest as well.  Learn and use tips on how to relax.  Make sure you get enough sleep. Most adults need at least 7 hours of sleep every night.  Eat healthy foods. Eat smaller meals more often. Rest before meals. Controlled breathing Learn and use tips on how to control your breathing as told by your doctor. Try:  Breathing in (inhaling) through your nose for 1 second. Then, pucker your lips and breath out (exhale) through your lips for 2 seconds.  Putting one hand on your belly  (abdomen). Breathe in slowly through your nose for 1 second. Your hand on your belly should move out. Pucker your lips and breathe out slowly through your lips. Your hand on your belly should move in as you breathe out.  Controlled coughing Learn and use controlled coughing to clear mucus from your lungs. Follow these steps: 1. Lean your head a little forward. 2. Breathe in deeply. 3. Try to hold your breath for 3 seconds. 4. Keep your mouth slightly open while coughing 2 times. 5. Spit any mucus out into a tissue. 6. Rest and do the steps again 1 or 2 times as needed. General instructions  Make sure you get all the shots (vaccines) that your doctor recommends. Ask your doctor about a flu shot and a pneumonia shot.  Use oxygen therapy and pulmonary rehabilitation if told by your doctor. If you need home oxygen therapy, ask your doctor if you should buy a tool to measure your oxygen level (oximeter).  Make a COPD action plan with your doctor. This helps you to know what to do if you feel worse than usual.  Manage any other conditions you have as told by your doctor.  Avoid going outside when it is very hot, cold, or humid.  Avoid people who have a sickness you can catch (contagious).  Keep all follow-up visits as told by your doctor. This is important. Contact a doctor if:  You cough up more mucus than usual.  There is a change in the color or thickness of the mucus.  It is harder to breathe than usual.  Your breathing is faster than usual.  You have trouble sleeping.  You need to use your medicines more often than usual.  You have trouble doing your normal activities such as getting dressed or walking around the house. Get help right away if:  You have shortness of breath while resting.  You have shortness of breath that stops you from: ? Being able to talk. ? Doing normal activities.  Your chest hurts for longer than 5 minutes.  Your skin color is more blue than  usual.  Your pulse oximeter shows that you have low oxygen for longer than 5 minutes.  You have a fever.  You feel too tired to breathe  normally. Summary  Chronic obstructive pulmonary disease (COPD) is a long-term lung problem.  The way your lungs work will never return to normal. Usually the condition gets worse over time. There are things you can do to keep yourself as healthy as possible.  Take over-the-counter and prescription medicines only as told by your doctor.  If you smoke, stop. Smoking makes the problem worse. This information is not intended to replace advice given to you by your health care provider. Make sure you discuss any questions you have with your health care provider. Document Released: 03/21/2008 Document Revised: 11/07/2016 Document Reviewed: 11/07/2016 Elsevier Interactive Patient Education  2019 [place] Risks of Smoking Smoking cigarettes is very bad for your health. Tobacco smoke has over 200 known poisons in it. It contains the poisonous gases nitrogen oxide and carbon monoxide. There are over 60 chemicals in tobacco smoke that cause cancer. Smoking is difficult to quit because a chemical in tobacco, called nicotine, causes addiction or dependence. When you smoke and inhale, nicotine is absorbed rapidly into the bloodstream through your lungs. Both inhaled and non-inhaled nicotine may be addictive. What are the risks of cigarette smoke? Cigarette smokers have an increased risk of many serious medical problems, including:  Lung cancer.  Lung disease, such as pneumonia, bronchitis, and emphysema.  Chest pain (angina) and heart attack because the heart is not getting enough oxygen.  Heart disease and peripheral blood vessel disease.  High blood pressure (hypertension).  Stroke.  Oral cancer, including cancer of the lip, mouth, or voice box.  Bladder cancer.  Pancreatic cancer.  Cervical cancer.  Pregnancy complications,  including premature birth.  Stillbirths and smaller newborn babies, birth defects, and genetic damage to sperm.  Early menopause.  Lower estrogen level for women.  Infertility.  Facial wrinkles.  Blindness.  Increased risk of broken bones (fractures).  Senile dementia.  Stomach ulcers and internal bleeding.  Delayed wound healing and increased risk of complications during surgery.  Even smoking lightly shortens your life expectancy by several years. Because of secondhand smoke exposure, children of smokers have an increased risk of the following:  Sudden infant death syndrome (SIDS).  Respiratory infections.  Lung cancer.  Heart disease.  Ear infections. What are the benefits of quitting? There are many health benefits of quitting smoking. Here are some of them:  Within days of quitting smoking, your risk of having a heart attack decreases, your blood flow improves, and your lung capacity improves. Blood pressure, pulse rate, and breathing patterns start returning to normal soon after quitting.  Within months, your lungs may clear up completely.  Quitting for 10 years reduces your risk of developing lung cancer and heart disease to almost that of a nonsmoker.  People who quit may see an improvement in their overall quality of life. How do I quit smoking?     Smoking is an addiction with both physical and psychological effects, and longtime habits can be hard to change. Your health care provider can recommend:  Programs and community resources, which may include group support, education, or talk therapy.  Prescription medicines to help reduce cravings.  Nicotine replacement products, such as patches, gum, and nasal sprays. Use these products only as directed. Do not replace cigarette smoking with electronic cigarettes, which are commonly called e-cigarettes. The safety of e-cigarettes is not known, and some may contain harmful chemicals.  A combination of two or  more of these methods. Where to find more information  American  Lung Association: www.lung.org  American Cancer Society: www.cancer.org Summary  Smoking cigarettes is very bad for your health. Cigarette smokers have an increased risk of many serious medical problems, including several cancers, heart disease, and stroke.  Smoking is an addiction with both physical and psychological effects, and longtime habits can be hard to change.  By stopping right away, you can greatly reduce the risk of medical problems for you and your family.  To help you quit smoking, your health care provider can recommend programs, community resources, prescription medicines, and nicotine replacement products such as patches, gum, and nasal sprays. This information is not intended to replace advice given to you by your health care provider. Make sure you discuss any questions you have with your health care provider. Document Released: 11/10/2004 Document Revised: 01/04/2018 Document Reviewed: 10/07/2016 Elsevier Interactive Patient Education  2019 [employer].

## 2019-03-18 NOTE — Assessment & Plan Note (Signed)
Assessment: Smoking 5 cigarettes a day Patient is not interested in stopping smoking right now She has nicotine replacement therapies at home  Plan: Discussed nicotine replacement therapy with patient today She can do a reduced to quit method to supplementing a cigarette with nicotine lozenge over the next month to hopefully get down to 0 cigarettes a month She reports she will think about starting this plan Follow-up with our office in 3 to 4 months

## 2019-03-18 NOTE — Assessment & Plan Note (Signed)
Assessment: History of esophageal stretching Upper endoscopy in 2015 Patient reports occasional bouts of dysphasia over the last 2 to 3 weeks  Plan: Referral back to gastroenterology

## 2019-03-18 NOTE — Assessment & Plan Note (Signed)
Plan: Follow-up with primary care regarding antianxiety medications and your need for refill

## 2019-03-19 NOTE — Progress Notes (Signed)
PCCM: Thanks for calling.  Garner Nash, DO [facility] 03/19/2019 3:31 PM

## 2019-04-17 ENCOUNTER — Telehealth: Payer: Self-pay | Admitting: Pulmonary Disease

## 2019-04-17 NOTE — Telephone Encounter (Signed)
04/17/2019 1041  Lauren, please contact the patient as she is not followed up with gastroenterology as instructed.  She was referred to them on 03/18/2019.  She has not returned her calls.  Wyn Quaker, FNP

## 2019-04-22 NOTE — Telephone Encounter (Signed)
Patient returned my call and I advised her to call [facility] and make a follow up appointment. She states that they have not called her and it hasn't crossed her mind. She states that she has their contact information and she will call them and make an appointment.

## 2019-04-22 NOTE — Telephone Encounter (Signed)
Left voicemail for patient to call back. 

## 2019-04-22 NOTE — Telephone Encounter (Signed)
Thank you. The pt per GI records has been called multiple times.   [facility]

## 2019-07-16 ENCOUNTER — Other Ambulatory Visit: Payer: Self-pay | Admitting: *Deleted

## 2019-07-16 DIAGNOSIS — Z122 Encounter for screening for malignant neoplasm of respiratory organs: Secondary | ICD-10-CM

## 2019-07-16 DIAGNOSIS — F17210 Nicotine dependence, cigarettes, uncomplicated: Secondary | ICD-10-CM

## 2019-07-31 ENCOUNTER — Ambulatory Visit
Admission: RE | Admit: 2019-07-31 | Discharge: 2019-07-31 | Disposition: A | Discharge status: Home / Self Care | Payer: 59 | Source: Ambulatory Visit | Attending: Acute Care | Admitting: Acute Care

## 2019-07-31 ENCOUNTER — Other Ambulatory Visit: Payer: Self-pay

## 2019-07-31 ENCOUNTER — Encounter: Payer: Self-pay | Admitting: Acute Care

## 2019-07-31 ENCOUNTER — Ambulatory Visit (INDEPENDENT_AMBULATORY_CARE_PROVIDER_SITE_OTHER): Payer: 59 | Admitting: Acute Care

## 2019-07-31 VITALS — BP 120/72 | HR 79 | Temp 97.2°F | Ht 62.0 in | Wt 160.8 lb

## 2019-07-31 DIAGNOSIS — F17210 Nicotine dependence, cigarettes, uncomplicated: Secondary | ICD-10-CM

## 2019-07-31 DIAGNOSIS — Z122 Encounter for screening for malignant neoplasm of respiratory organs: Secondary | ICD-10-CM

## 2019-07-31 NOTE — Patient Instructions (Signed)
Thank you for participating in the [facility]. It was our pleasure to meet you today. We will call you with the results of your scan within the next few days. Your scan will be assigned a Lung RADS category score by the physicians reading the scans.  This Lung RADS score determines follow up scanning.  See below for description of categories, and follow up screening recommendations. We will be in touch to schedule your follow up screening annually or based on recommendations of our providers. We will fax a copy of your scan results to your Primary Care Physician, or the physician who referred you to the program, to ensure they have the results. Please call the office if you have any questions or concerns regarding your scanning experience or results.  Our office number is [phone]. Please speak with Denise Phelps, RN. She is our Lung Cancer Screening RN. If she is unavailable when you call, please have the office staff send her a message. She will return your call at her earliest convenience. Remember, if your scan is normal, we will scan you annually as long as you continue to meet the criteria for the program. (Age 55-77, Current smoker or smoker who has quit within the last 15 years). If you are a smoker, remember, quitting is the single most powerful action that you can take to decrease your risk of lung cancer and other pulmonary, breathing related problems. We know quitting is hard, and we are here to help.  Please let us know if there is anything we can do to help you meet your goal of quitting. If you are a former smoker, congratulations. We are proud of you! Remain smoke free! Remember you can refer friends or family members through the number above.  We will screen them to make sure they meet criteria for the program. Thank you for helping us take better care of you by participating in Lung Screening.  Lung RADS Categories:  Lung RADS 1: no nodules  or definitely non-concerning nodules.  Recommendation is for a repeat annual scan in 12 months.  Lung RADS 2:  nodules that are non-concerning in appearance and behavior with a very low likelihood of becoming an active cancer. Recommendation is for a repeat annual scan in 12 months.  Lung RADS 3: nodules that are probably non-concerning , includes nodules with a low likelihood of becoming an active cancer.  Recommendation is for a 6-month repeat screening scan. Often noted after an upper respiratory illness. We will be in touch to make sure you have no questions, and to schedule your 6-month scan.  Lung RADS 4 A: nodules with concerning findings, recommendation is most often for a follow up scan in 3 months or additional testing based on our provider's assessment of the scan. We will be in touch to make sure you have no questions and to schedule the recommended 3 month follow up scan.  Lung RADS 4 B:  indicates findings that are concerning. We will be in touch with you to schedule additional diagnostic testing based on our provider's  assessment of the scan.   

## 2019-07-31 NOTE — Progress Notes (Signed)
Shared Decision Making Visit Lung Cancer Screening Program [phone])   Eligibility:  Age 63 y.o.  Pack Years Smoking History Calculation 50 pack year smoking history (# packs/per year x # years smoked)  Recent History of coughing up blood  no  Unexplained weight loss? no ( >Than 15 pounds within the last 6 months )  Prior History Lung / other cancer no (Diagnosis within the last 5 years already requiring surveillance chest CT Scans).  Smoking Status Current Smoker  Former Smokers: Years since quit: NA  Quit Date: NA  Visit Components:  Discussion included one or more decision making aids. yes  Discussion included risk/benefits of screening. yes  Discussion included potential follow up diagnostic testing for abnormal scans. yes  Discussion included meaning and risk of over diagnosis. yes  Discussion included meaning and risk of False Positives. yes  Discussion included meaning of total radiation exposure. yes  Counseling Included:  Importance of adherence to annual lung cancer LDCT screening. yes  Impact of comorbidities on ability to participate in the program. yes  Ability and willingness to under diagnostic treatment. yes  Smoking Cessation Counseling:  Current Smokers:   Discussed importance of smoking cessation. yes  Information about tobacco cessation classes and interventions provided to patient. yes  Patient provided with "ticket" for LDCT Scan. yes  Symptomatic Patient. no  Counseling:NA  Diagnosis Code: Tobacco Use Z72.0  Asymptomatic Patient yes  Counseling (Intermediate counseling: > three minutes counseling) [id number]  Former Smokers:   Discussed the importance of maintaining cigarette abstinence. yes  Diagnosis Code: Personal History of Nicotine Dependence. [id number]  Information about tobacco cessation classes and interventions provided to patient. Yes  Patient provided with "ticket" for LDCT Scan. yes  Written Order for Lung Cancer  Screening with LDCT placed in Epic. Yes (CT Chest Lung Cancer Screening Low Dose W/O CM) [id number] Z12.2-Screening of respiratory organs Z87.891-Personal history of nicotine dependence  BP 120/72 (BP Location: Left Arm, Cuff Size: Large)   Pulse 79   Temp (!) 97.2 F (36.2 C) (Oral)   Ht 5\' 2"  (1.575 m)   Wt 160 lb 12.8 oz (72.9 kg)   SpO2 96%   BMI 29.41 kg/m    I have spent 25 minutes of face to face time with Lynn Kline discussing the risks and benefits of lung cancer screening. We viewed a power point together that explained in detail the above noted topics. We paused at intervals to allow for questions to be asked and answered to ensure understanding.We discussed that the single most powerful action that she can take to decrease her risk of developing lung cancer is to quit smoking. We discussed whether or not she is ready to commit to setting a quit date. We discussed options for tools to aid in quitting smoking including nicotine replacement therapy, non-nicotine medications, support groups, Quit Smart classes, and behavior modification. We discussed that often times setting smaller, more achievable goals, such as eliminating 1 cigarette a day for a week and then 2 cigarettes a day for a week can be helpful in slowly decreasing the number of cigarettes smoked. This allows for a sense of accomplishment as well as providing a clinical benefit. I gave her the " Be Stronger Than Your Excuses" card with contact information for community resources, classes, free nicotine replacement therapy, and access to mobile apps, text messaging, and on-line smoking cessation help. I have also given her my card and contact information in the event she needs to contact me. We discussed  the time and location of the scan, and that either Lynn Glassman RN or I will call with the results within 24-48 hours of receiving them. I have offered her  a copy of the power point we viewed  as a resource in the event they need  reinforcement of the concepts we discussed today in the office. The patient verbalized understanding of all of  the above and had no further questions upon leaving the office. They have my contact information in the event they have any further questions.  I spent 3 minutes counseling on smoking cessation and the health risks of continued tobacco abuse.  I explained to the patient that there has been a high incidence of coronary artery disease noted on these exams. I explained that this is a non-gated exam therefore degree or severity cannot be determined. This patient is not on statin therapy. I have asked the patient to follow-up with their PCP regarding any incidental finding of coronary artery disease and management with diet or medication as their PCP  feels is clinically indicated. The patient verbalized understanding of the above and had no further questions upon completion of the visit.       Magdalen Spatz, NP 07/31/2019 11:02 AM

## 2019-08-01 ENCOUNTER — Telehealth: Payer: Self-pay

## 2019-08-01 DIAGNOSIS — Z87891 Personal history of nicotine dependence: Secondary | ICD-10-CM

## 2019-08-01 DIAGNOSIS — Z122 Encounter for screening for malignant neoplasm of respiratory organs: Secondary | ICD-10-CM

## 2019-08-01 DIAGNOSIS — F17210 Nicotine dependence, cigarettes, uncomplicated: Secondary | ICD-10-CM

## 2019-08-01 NOTE — Telephone Encounter (Signed)
Pt informed of CT results per Sarah Groce, NP.  PT verbalized understanding.  Copy sent to PCP.  Order placed for 1 yr f/u CT.  

## 2019-08-05 ENCOUNTER — Other Ambulatory Visit: Payer: Self-pay

## 2019-08-05 MED ORDER — TRELEGY ELLIPTA 100-62.5-25 MCG/INH IN AEPB: 1.0000 | INHALATION_SPRAY | Freq: Every day | RESPIRATORY_TRACT | 5 refills | Status: DC

## 2019-08-23 IMAGING — CT CT CHEST W/O CM
2 of 3 series · 15 of 36 positions shown, 18 images · IV contrast (ISOVUE 300)
Comparison: 07/18/2018

CLINICAL DATA: Abnormal chest x-ray.Mid chest pain radiating to the
back.

EXAM:
CT CHEST WITHOUT CONTRAST
TECHNIQUE: Multidetector CT imaging of the chest was performed following the
standard protocol without IV contrast.

[Series 2: thorax · axial · 0.70mm/px · z∈[-312,-36]mm · 12 of 162 slices shown, 15 images]
[im 12/162  mediastinal]
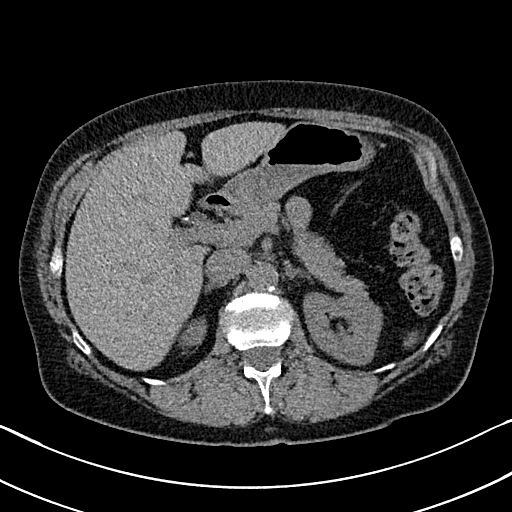
[im 12/162  lung]
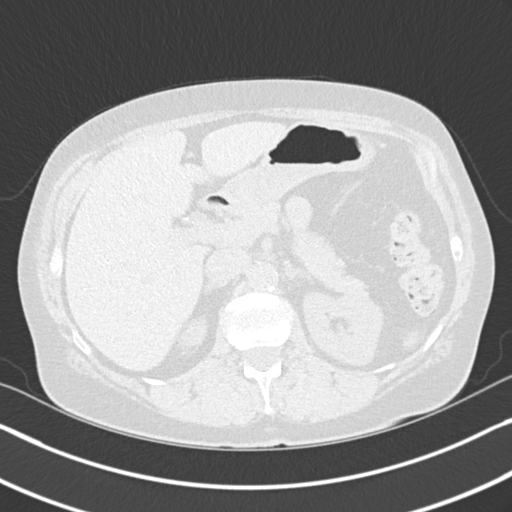
[im 24/162  lung]
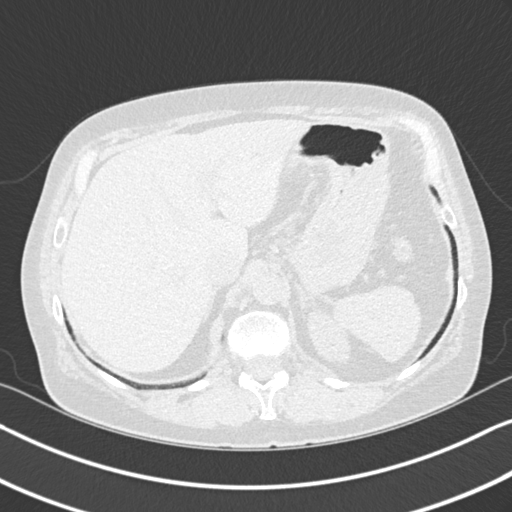
[im 36/162  lung]
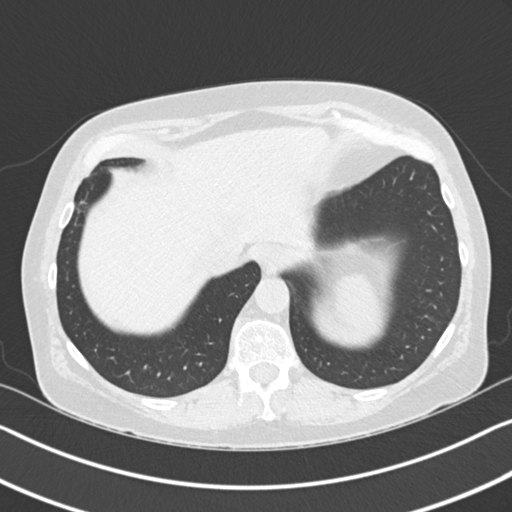
[im 48/162  lung]
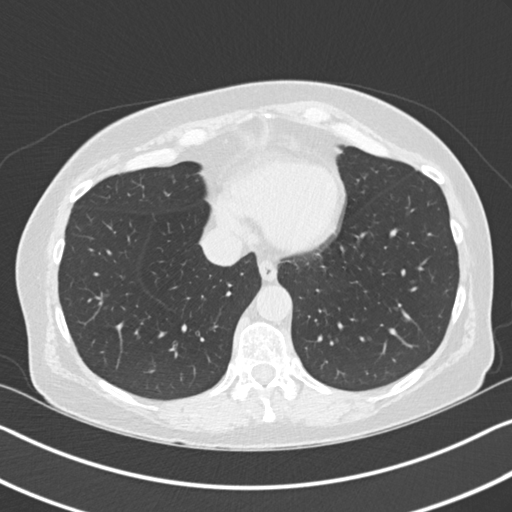
[im 60/162  mediastinal]
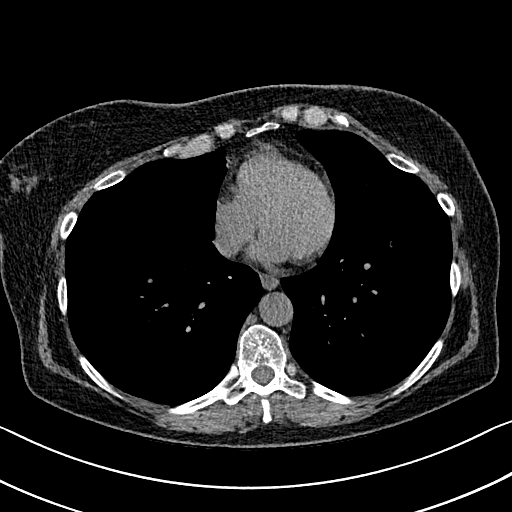
[im 60/162  lung]
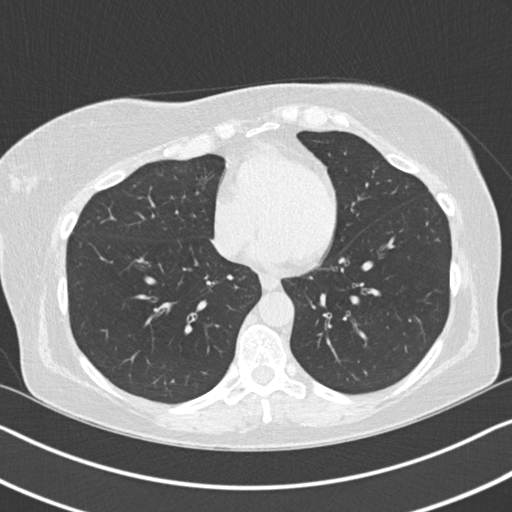
[im 72/162  lung]
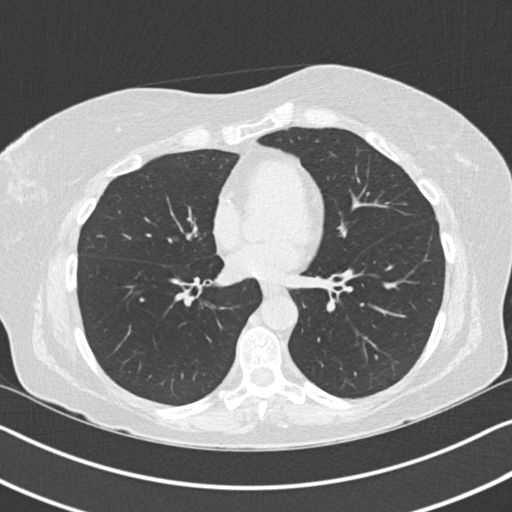
[im 90/162  lung]
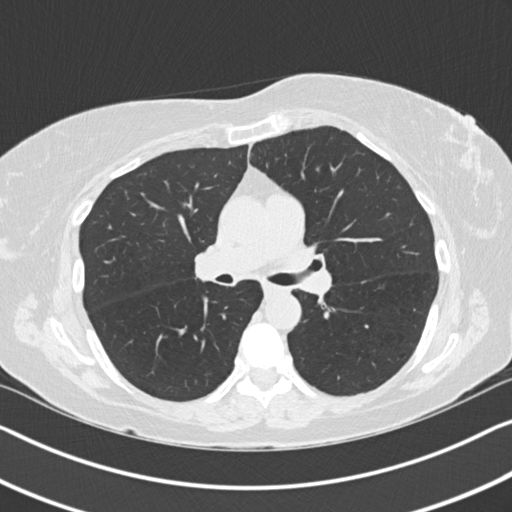
[im 102/162  lung]
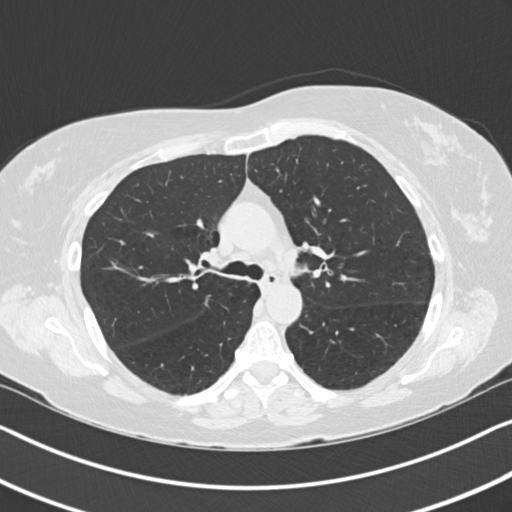
[im 114/162  mediastinal]
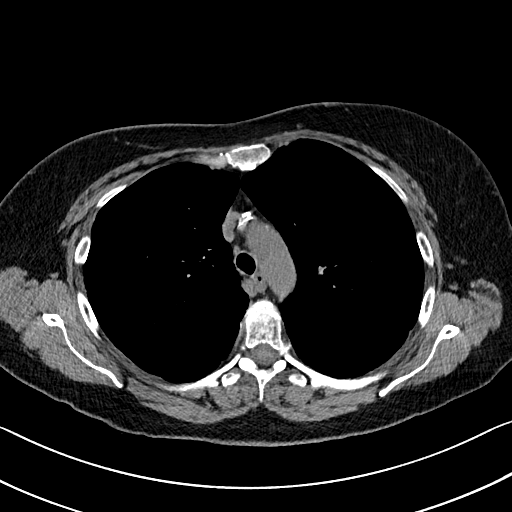
[im 114/162  lung]
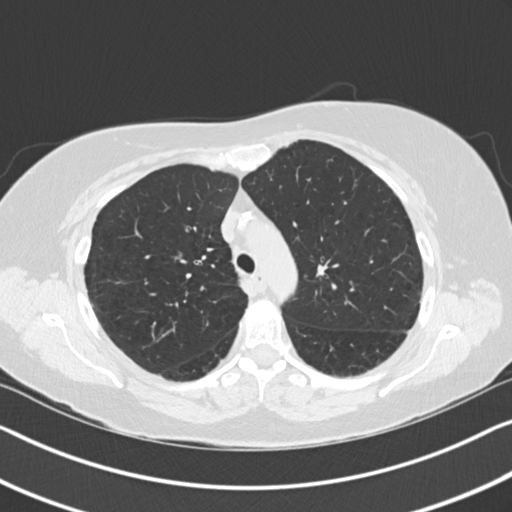
[im 126/162  lung]
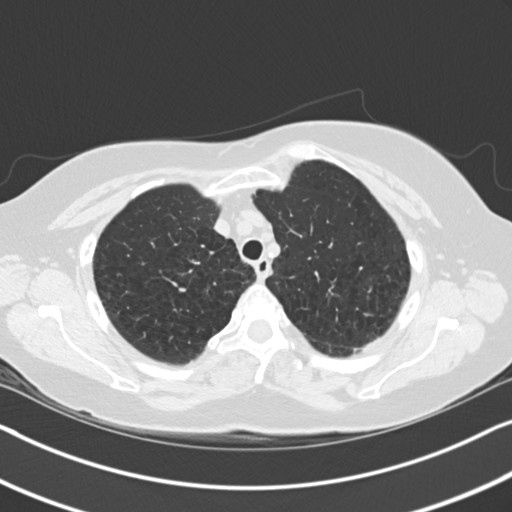
[im 138/162  lung]
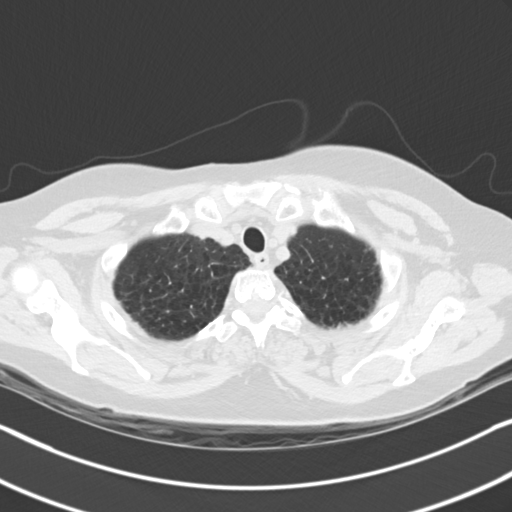
[im 150/162  lung]
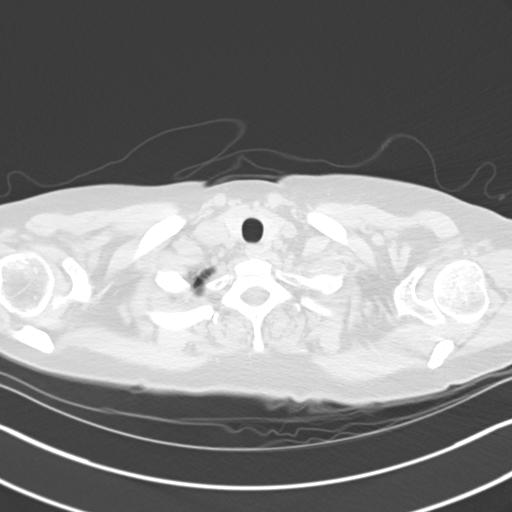

[Series 5: coronal · coronal · 0.63mm/px · 3 of 127 slices shown]
[im 26/127  lung]
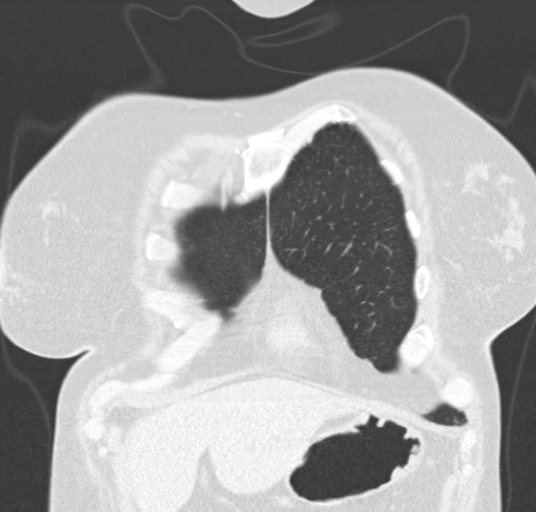
[im 51/127  lung]
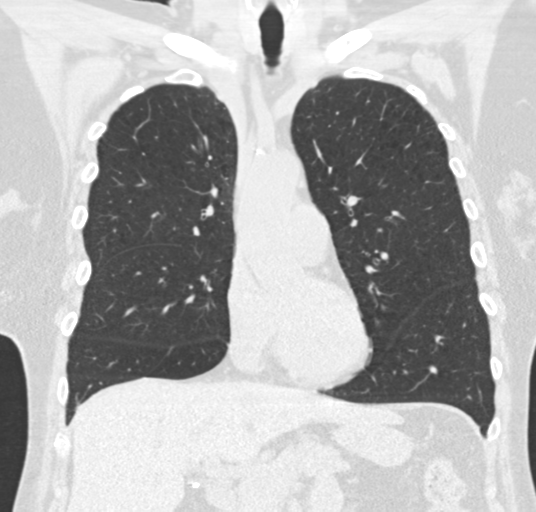
[im 76/127  lung]
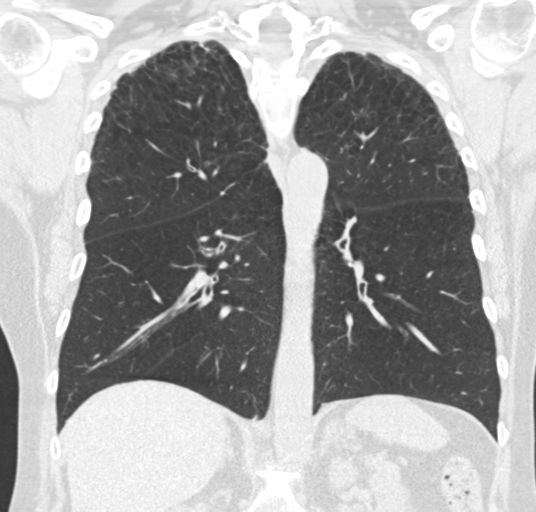

[15 of 36 positions shown; findings below may reference images not displayed]

FINDINGS: Cardiovascular: Normal heart size. No pericardial effusion. Normal
caliber of the great vessels. Mild calcific atherosclerotic disease
of the aorta.

Mediastinum/Nodes: No enlarged mediastinal or axillary lymph nodes.
Thyroid gland, trachea, and esophagus demonstrate no significant
findings.

Lungs/Pleura: Upper lobe predominant emphysematous changes. Biapical
pleural/subpleural scarring. No pleural effusion, or lobar
consolidation.

Upper Abdomen: No acute abnormality.

Musculoskeletal: Age-indeterminate superior endplate compression
fracture of T7 vertebral body with approximately 50% height loss
centrally. Mild inferior endplate deformity of T12 vertebral body.
IMPRESSION: Upper lobe predominant moderate emphysematous changes with biapical
pleural/subpleural scarring.

No pleural effusions seen.

Age-indeterminate superior endplate of T7 vertebral body compression
fracture with approximately 50% height loss centrally.

Aortic Atherosclerosis (M7WE4-VCQ.Q) and Emphysema (M7WE4-JXY.O).

## 2019-09-23 ENCOUNTER — Other Ambulatory Visit: Payer: Self-pay

## 2019-09-23 ENCOUNTER — Encounter: Payer: Self-pay | Admitting: Internal Medicine

## 2019-09-23 ENCOUNTER — Other Ambulatory Visit (INDEPENDENT_AMBULATORY_CARE_PROVIDER_SITE_OTHER): Payer: 59

## 2019-09-23 ENCOUNTER — Ambulatory Visit (INDEPENDENT_AMBULATORY_CARE_PROVIDER_SITE_OTHER): Payer: 59 | Admitting: Internal Medicine

## 2019-09-23 VITALS — BP 130/80 | HR 75 | Temp 98.4°F | Ht 62.0 in | Wt 159.0 lb

## 2019-09-23 DIAGNOSIS — Z0000 Encounter for general adult medical examination without abnormal findings: Secondary | ICD-10-CM

## 2019-09-23 DIAGNOSIS — E785 Hyperlipidemia, unspecified: Secondary | ICD-10-CM | POA: Diagnosis not present

## 2019-09-23 DIAGNOSIS — Z1231 Encounter for screening mammogram for malignant neoplasm of breast: Secondary | ICD-10-CM

## 2019-09-23 DIAGNOSIS — B029 Zoster without complications: Secondary | ICD-10-CM

## 2019-09-23 DIAGNOSIS — J441 Chronic obstructive pulmonary disease with (acute) exacerbation: Secondary | ICD-10-CM

## 2019-09-23 DIAGNOSIS — Z1159 Encounter for screening for other viral diseases: Secondary | ICD-10-CM | POA: Insufficient documentation

## 2019-09-23 DIAGNOSIS — L309 Dermatitis, unspecified: Secondary | ICD-10-CM

## 2019-09-23 DIAGNOSIS — Z124 Encounter for screening for malignant neoplasm of cervix: Secondary | ICD-10-CM

## 2019-09-23 LAB — CBC WITH DIFFERENTIAL/PLATELET
Basophils Absolute: 0.1 10*3/uL (ref 0.0–0.1)
Basophils Relative: 1.3 % (ref 0.0–3.0)
Eosinophils Absolute: 0.2 10*3/uL (ref 0.0–0.7)
Eosinophils Relative: 3.2 % (ref 0.0–5.0)
HCT: 39.7 % (ref 36.0–46.0)
Hemoglobin: 13.3 g/dL (ref 12.0–15.0)
Lymphocytes Relative: 33.4 % (ref 12.0–46.0)
Lymphs Abs: 2.4 10*3/uL (ref 0.7–4.0)
MCHC: 33.5 g/dL (ref 30.0–36.0)
MCV: 92.7 fl (ref 78.0–100.0)
Monocytes Absolute: 0.6 10*3/uL (ref 0.1–1.0)
Monocytes Relative: 9.1 % (ref 3.0–12.0)
Neutro Abs: 3.7 10*3/uL (ref 1.4–7.7)
Neutrophils Relative %: 53 % (ref 43.0–77.0)
Platelets: 345 10*3/uL (ref 150.0–400.0)
RBC: 4.28 Mil/uL (ref 3.87–5.11)
RDW: 14 % (ref 11.5–15.5)
WBC: 7.1 10*3/uL (ref 4.0–10.5)

## 2019-09-23 LAB — HEPATIC FUNCTION PANEL
ALT: 37 U/L — ABNORMAL HIGH (ref 0–35)
AST: 27 U/L (ref 0–37)
Albumin: 4.1 g/dL (ref 3.5–5.2)
Alkaline Phosphatase: 93 U/L (ref 39–117)
Bilirubin, Direct: 0 mg/dL (ref 0.0–0.3)
Total Bilirubin: 0.2 mg/dL (ref 0.2–1.2)
Total Protein: 6.8 g/dL (ref 6.0–8.3)

## 2019-09-23 LAB — LIPID PANEL
Cholesterol: 198 mg/dL (ref 0–200)
HDL: 55.3 mg/dL (ref >39.00)
LDL Cholesterol: 110 mg/dL — ABNORMAL HIGH (ref 0–99)
NonHDL: 142.4
Total CHOL/HDL Ratio: 4
Triglycerides: 160 mg/dL — ABNORMAL HIGH (ref 0.0–149.0)
VLDL: 32 mg/dL (ref 0.0–40.0)

## 2019-09-23 LAB — TSH: TSH: 1.24 u[IU]/mL (ref 0.35–4.50)

## 2019-09-23 LAB — BASIC METABOLIC PANEL
BUN: 12 mg/dL (ref 6–23)
CO2: 29 mEq/L (ref 19–32)
Calcium: 9.1 mg/dL (ref 8.4–10.5)
Chloride: 105 mEq/L (ref 96–112)
Creatinine, Ser: 0.73 mg/dL (ref 0.40–1.20)
GFR: 80.17 mL/min (ref >60.00)
Glucose, Bld: 94 mg/dL (ref 70–99)
Potassium: 3.8 mEq/L (ref 3.5–5.1)
Sodium: 140 mEq/L (ref 135–145)

## 2019-09-23 MED ORDER — PREDNISONE 5 MG PO TABS: 15.0000 mg | ORAL_TABLET | Freq: Two times a day (BID) | ORAL | 0 refills | Status: AC

## 2019-09-23 MED ORDER — PREDNISONE 2.5 MG PO TABS: 7.5000 mg | ORAL_TABLET | Freq: Two times a day (BID) | ORAL | 0 refills | Status: AC

## 2019-09-23 MED ORDER — PREDNISONE 10 MG PO TABS: 30.0000 mg | ORAL_TABLET | Freq: Two times a day (BID) | ORAL | 0 refills | Status: AC

## 2019-09-23 MED ORDER — OXYCODONE HCL 5 MG PO TABS: 5.0000 mg | ORAL_TABLET | ORAL | 0 refills | Status: AC | PRN

## 2019-09-23 MED ORDER — VALACYCLOVIR HCL 1 G PO TABS: 1000.0000 mg | ORAL_TABLET | Freq: Three times a day (TID) | ORAL | 1 refills | Status: AC

## 2019-09-23 MED ORDER — FLUOCINONIDE-E 0.05 % EX CREA: 1.0000 "application " | TOPICAL_CREAM | Freq: Two times a day (BID) | CUTANEOUS | 1 refills | Status: DC

## 2019-09-23 NOTE — Patient Instructions (Signed)
Shingles  Shingles, which is also known as herpes zoster, is an infection that causes a painful skin rash and fluid-filled blisters. It is caused by a virus. Shingles only develops in people who:  Have had chickenpox.  Have been given a medicine to protect against chickenpox (have been vaccinated). Shingles is rare in this group. What are the causes? Shingles is caused by varicella-zoster virus (VZV). This is the same virus that causes chickenpox. After a person is exposed to VZV, the virus stays in the body in an inactive (dormant) state. Shingles develops if the virus is reactivated. This can happen many years after the first (initial) exposure to VZV. It is not known what causes this virus to be reactivated. What increases the risk? People who have had chickenpox or received the chickenpox vaccine are at risk for shingles. Shingles infection is more common in people who:  Are older than age 60.  Have a weakened disease-fighting system (immune system), such as people with: ? HIV. ? AIDS. ? Cancer.  Are taking medicines that weaken the immune system, such as transplant medicines.  Are experiencing a lot of stress. What are the signs or symptoms? Early symptoms of this condition include itching, tingling, and pain in an area on your skin. Pain may be described as burning, stabbing, or throbbing. A few days or weeks after early symptoms start, a painful red rash appears. The rash is usually on one side of the body and has a band-like or belt-like pattern. The rash eventually turns into fluid-filled blisters that break open, change into scabs, and dry up in about 2-3 weeks. At any time during the infection, you may also develop:  A fever.  Chills.  A headache.  An upset stomach. How is this diagnosed? This condition is diagnosed with a skin exam. Skin or fluid samples may be taken from the blisters before a diagnosis is made. These samples are examined under a microscope or sent to  a lab for testing. How is this treated? The rash may last for several weeks. There is not a specific cure for this condition. Your health care provider will probably prescribe medicines to help you manage pain, recover more quickly, and avoid long-term problems. Medicines may include:  Antiviral drugs.  Anti-inflammatory drugs.  Pain medicines.  Anti-itching medicines (antihistamines). If the area involved is on your face, you may be referred to a specialist, such as an eye doctor (ophthalmologist) or an ear, nose, and throat (ENT) doctor (otolaryngologist) to help you avoid eye problems, chronic pain, or disability. Follow these instructions at home: Medicines  Take over-the-counter and prescription medicines only as told by your health care provider.  Apply an anti-itch cream or numbing cream to the affected area as told by your health care provider. Relieving itching and discomfort   Apply cold, wet cloths (cold compresses) to the area of the rash or blisters as told by your health care provider.  Cool baths can be soothing. Try adding baking soda or dry oatmeal to the water to reduce itching. Do not bathe in hot water. Blister and rash care  Keep your rash covered with a loose bandage (dressing). Wear loose-fitting clothing to help ease the pain of material rubbing against the rash.  Keep your rash and blisters clean by washing the area with mild soap and cool water as told by your health care provider.  Check your rash every day for signs of infection. Check for: ? More redness, swelling, or pain. ? Fluid   or blood. ? Warmth. ? Pus or a bad smell.  Do not scratch your rash or pick at your blisters. To help avoid scratching: ? Keep your fingernails clean and cut short. ? Wear gloves or mittens while you sleep, if scratching is a problem. General instructions  Rest as told by your health care provider.  Keep all follow-up visits as told by your health care provider. This  is important.  Wash your hands often with soap and water. If soap and water are not available, use hand sanitizer. Doing this lowers your chance of getting a bacterial skin infection.  Before your blisters change into scabs, your shingles infection can cause chickenpox in people who have never had it or have never been vaccinated against it. To prevent this from happening, avoid contact with other people, especially: ? Babies. ? Pregnant women. ? Children who have eczema. ? Elderly people who have transplants. ? People who have chronic illnesses, such as cancer or AIDS. Contact a health care provider if:  Your pain is not relieved with prescribed medicines.  Your pain does not get better after the rash heals.  You have signs of infection in the rash area, such as: ? More redness, swelling, or pain around the rash. ? Fluid or blood coming from the rash. ? The rash area feeling warm to the touch. ? Pus or a bad smell coming from the rash. Get help right away if:  The rash is on your face or nose.  You have facial pain, pain around your eye area, or loss of feeling on one side of your face.  You have difficulty seeing.  You have ear pain or have ringing in your ear.  You have a loss of taste.  Your condition gets worse. Summary  Shingles, which is also known as herpes zoster, is an infection that causes a painful skin rash and fluid-filled blisters.  This condition is diagnosed with a skin exam. Skin or fluid samples may be taken from the blisters and examined before the diagnosis is made.  Keep your rash covered with a loose bandage (dressing). Wear loose-fitting clothing to help ease the pain of material rubbing against the rash.  Before your blisters change into scabs, your shingles infection can cause chickenpox in people who have never had it or have never been vaccinated against it. This information is not intended to replace advice given to you by your health care  provider. Make sure you discuss any questions you have with your health care provider. Document Released: 10/03/2005 Document Revised: 01/25/2019 Document Reviewed: 06/07/2017 [employer] Patient Education  2020 [employer]  

## 2019-09-23 NOTE — Progress Notes (Signed)
Subjective:  Patient ID: Lynn Kline, female    DOB: September 29, 1956  Age: 63 y.o. MRN: [id number]  CC: Annual Exam, Hyperlipidemia, COPD, and Rash   HPI Lynn Kline presents for a CPX.  She complains of 2 distinct rashes.  For the last 5 days she has had a painful rash that started on the right top of her scalp and has spread to the right side of her face.  She saw an ophthalmologist at her place of employment earlier today and was told that it is shingles.  She complains of a 3-week history of itchy rash on the palms of both hands.  She has not gotten much symptom relief with over-the-counter hydrocortisone cream.  She tells me she is a compulsive user of hand sanitizer.  Outpatient Medications Prior to Visit  Medication Sig Dispense Refill  . albuterol (PROVENTIL) (2.5 MG/3ML) 0.083% nebulizer solution Take 3 mLs (2.5 mg total) by nebulization every 4 (four) hours as needed for wheezing or shortness of breath. 75 mL 5  . albuterol (VENTOLIN HFA) 108 (90 Base) MCG/ACT inhaler Inhale 1-2 puffs into the lungs every 6 (six) hours as needed for wheezing or shortness of breath. 1 Inhaler 5  . ALPRAZolam (XANAX) 0.25 MG tablet Take 1 tablet (0.25 mg total) by mouth 2 (two) times daily as needed for anxiety. 20 tablet 0  . calcium-vitamin D 250-100 MG-UNIT tablet Take 1 tablet by mouth daily. 30 tablet 5  . Cholecalciferol (VITAMIN D) 50 MCG (2000 UT) CAPS Take 1 capsule (2,000 Units total) by mouth daily. 30 capsule 5  . Fluticasone-Umeclidin-Vilant (TRELEGY ELLIPTA) 100-62.5-25 MCG/INH AEPB Inhale 1 puff into the lungs daily. 60 each 5  . benzonatate (TESSALON) 200 MG capsule Take 1 capsule (200 mg total) by mouth 3 (three) times daily as needed for cough. 30 capsule 1  . predniSONE (DELTASONE) 10 MG tablet Take 2 tablets (20mg  total) daily for the next 5 days. Take in the AM with food. 10 tablet 0   No facility-administered medications prior to visit.     ROS Review of  Systems  Constitutional: Negative.  Negative for diaphoresis and fatigue.  HENT: Negative.   Eyes: Negative.   Respiratory: Negative for cough, chest tightness, shortness of breath and wheezing.   Cardiovascular: Negative for chest pain, palpitations and leg swelling.  Gastrointestinal: Negative for abdominal pain, constipation, diarrhea, nausea and vomiting.  Endocrine: Negative.   Genitourinary: Negative.  Negative for difficulty urinating, hematuria and urgency.  Musculoskeletal: Negative for arthralgias and myalgias.  Skin: Positive for color change and rash.  Neurological: Negative.  Negative for dizziness, weakness, light-headedness and headaches.  Hematological: Negative for adenopathy. Does not bruise/bleed easily.  Psychiatric/Behavioral: Negative.     Objective:  BP 130/80 (BP Location: Left Arm, Patient Position: Sitting, Cuff Size: Normal)   Pulse 75   Temp 98.4 F (36.9 C) (Oral)   Ht 5\' 2"  (1.575 m)   Wt 159 lb (72.1 kg)   SpO2 97%   BMI 29.08 kg/m   BP Readings from Last 3 Encounters:  09/23/19 130/80  07/31/19 120/72  03/18/19 116/76    Wt Readings from Last 3 Encounters:  09/23/19 159 lb (72.1 kg)  07/31/19 160 lb 12.8 oz (72.9 kg)  03/18/19 155 lb 6.4 oz (70.5 kg)    Physical Exam Vitals signs reviewed.  Constitutional:      General: She is not in acute distress.    Appearance: Normal appearance.  She is not ill-appearing, toxic-appearing or diaphoretic.  HENT:     Nose: Nose normal.     Mouth/Throat:     Mouth: Mucous membranes are moist.  Eyes:     General: No scleral icterus.    Conjunctiva/sclera: Conjunctivae normal.  Neck:     Musculoskeletal: Neck supple.  Cardiovascular:     Rate and Rhythm: Normal rate and regular rhythm.     Heart sounds: No murmur.  Pulmonary:     Effort: Pulmonary effort is normal.     Breath sounds: Examination of the left-upper field reveals rhonchi. Examination of the right-middle field reveals rhonchi.  Examination of the left-middle field reveals rhonchi. Examination of the right-lower field reveals rhonchi. Examination of the left-lower field reveals rhonchi. Rhonchi present. No decreased breath sounds, wheezing or rales.  Abdominal:     General: Abdomen is flat. Bowel sounds are normal.     Palpations: There is no mass.     Tenderness: There is no abdominal tenderness.  Musculoskeletal: Normal range of motion.  Lymphadenopathy:     Cervical: No cervical adenopathy.  Skin:    Coloration: Skin is not jaundiced.     Findings: Erythema and rash present. Rash is vesicular. Rash is not crusting, macular, nodular, papular, purpuric, pustular, scaling or urticarial.          Comments: Over the crown of the scalp just to the right side of midline there are several clusters of excoriated vesicles and then extending onto the right side of the face are several erythematous patches with slight swelling.  See photo.  On the palmar sides of both hands, symmetrically, there are patches with fissuring, scaling, and faint erythema.  See photos.  Neurological:     General: No focal deficit present.     Mental Status: She is alert.  Psychiatric:        Mood and Affect: Mood normal.     Lab Results  Component Value Date   WBC 7.1 09/23/2019   HGB 13.3 09/23/2019   HCT 39.7 09/23/2019   PLT 345.0 09/23/2019   GLUCOSE 94 09/23/2019   CHOL 198 09/23/2019   TRIG 160.0 (H) 09/23/2019   HDL 55.30 09/23/2019   LDLDIRECT 141.0 11/15/2011   LDLCALC 110 (H) 09/23/2019   ALT 37 (H) 09/23/2019   AST 27 09/23/2019   NA 140 09/23/2019   K 3.8 09/23/2019   CL 105 09/23/2019   CREATININE 0.73 09/23/2019   BUN 12 09/23/2019   CO2 29 09/23/2019   TSH 1.24 09/23/2019   HGBA1C 5.8 08/19/2014    Ct Chest Lung Ca Screen Low Dose W/o Cm  Result Date: 07/31/2019 CLINICAL DATA:  63 year old female current smoker with 50 pack-year history of smoking. Lung cancer screening examination. EXAM: CT CHEST WITHOUT  CONTRAST LOW-DOSE FOR LUNG CANCER SCREENING TECHNIQUE: Multidetector CT imaging of the chest was performed following the standard protocol without IV contrast. COMPARISON:  Chest CT 07/24/2018. FINDINGS: Cardiovascular: Heart size is normal. There is no significant pericardial fluid, thickening or pericardial calcification. There is aortic atherosclerosis, as well as atherosclerosis of the great vessels of the mediastinum and the coronary arteries, including calcified atherosclerotic plaque in the right coronary artery. Mediastinum/Nodes: No pathologically enlarged mediastinal or hilar lymph nodes. Please note that accurate exclusion of hilar adenopathy is limited on noncontrast CT scans. Esophagus is unremarkable in appearance. No axillary lymphadenopathy. Lungs/Pleura: Multiple small pulmonary nodules are noted throughout both lungs, largest of which is in the left upper lobe (axial  image 68 of series 3), with a volume derived mean diameter of 3.4 mm. No larger more suspicious appearing pulmonary nodules or masses are noted. No acute consolidative airspace disease. No pleural effusions. Mild diffuse bronchial wall thickening with mild to moderate centrilobular and paraseptal emphysema. Upper Abdomen: Aortic atherosclerosis.  Status post cholecystectomy. Musculoskeletal: Chronic compression fracture of T7 with 50% loss of anterior vertebral body height. There are no aggressive appearing lytic or blastic lesions noted in the visualized portions of the skeleton. IMPRESSION: 1. Lung-RADS 2S, benign appearance or behavior. Continue annual screening with low-dose chest CT without contrast in 12 months. 2. The "S" modifier above refers to potentially clinically significant non lung cancer related findings. Specifically, there is aortic atherosclerosis, in addition to right coronary artery disease. Please note that although the presence of coronary artery calcium documents the presence of coronary artery disease, the  severity of this disease and any potential stenosis cannot be assessed on this non-gated CT examination. Assessment for potential risk factor modification, dietary therapy or pharmacologic therapy may be warranted, if clinically indicated. 3. Mild diffuse bronchial wall thickening with mild centrilobular and paraseptal emphysema; imaging findings suggestive of underlying COPD. Aortic Atherosclerosis (ICD10-I70.0) and Emphysema (ICD10-J43.9). Electronically Signed   By: Vinnie Langton M.D.   On: 07/31/2019 13:45    Assessment & Plan:   [place] was seen today for annual exam, hyperlipidemia, copd and rash.  Diagnoses and all orders for this visit:  Routine general medical examination at a health care facility- Exam completed, labs reviewed, vaccines reviewed and updated, she is referred for cervical and breast cancer screening, colon cancer screening is up-to-date, patient education was given. -     Lipid panel; Future -     HIV antibody (Reflex); Future  Hyperlipidemia with target LDL less than 130- She has a mildly elevated ASCVD risk score and a recent chest CT revealed coronary atherosclerosis.  I have asked her to start taking a statin for CV risk reduction. -     Basic metabolic panel; Future -     TSH; Future -     Hepatic function panel; Future -     atorvastatin (LIPITOR) 20 MG tablet; Take 1 tablet (20 mg total) by mouth daily.  Need for hepatitis C screening test -     Hepatitis C antibody; Future  Chronic obstructive pulmonary disease with acute exacerbation (HCC) -     CBC with Differential; Future  Acute hand eczema -     fluocinonide-emollient (LIDEX-E) 0.05 % cream; Apply 1 application topically 2 (two) times daily.  Herpes zoster without complication- I will treat the pain with a 9-day course of prednisone and oxycodone.  Will treat the infection with Valacyclovir. -     predniSONE (DELTASONE) 10 MG tablet; Take 3 tablets (30 mg total) by mouth 2 (two) times daily with a  meal for 3 days. -     predniSONE (DELTASONE) 5 MG tablet; Take 3 tablets (15 mg total) by mouth 2 (two) times daily with a meal for 3 days. -     predniSONE (DELTASONE) 2.5 MG tablet; Take 3 tablets (7.5 mg total) by mouth 2 (two) times daily with a meal for 3 days. -     valACYclovir (VALTREX) 1000 MG tablet; Take 1 tablet (1,000 mg total) by mouth 3 (three) times daily for 7 days. -     oxyCODONE (OXY IR/ROXICODONE) 5 MG immediate release tablet; Take 1 tablet (5 mg total) by mouth every 4 (four) hours  as needed for up to 5 days for severe pain.  Visit for screening mammogram -     MM Digital Screening; Future  Cervical cancer screening -     Ambulatory referral to Gynecology   I have discontinued Minela L. Stubbe's predniSONE and benzonatate. I am also having her start on predniSONE, predniSONE, predniSONE, valACYclovir, fluocinonide-emollient, oxyCODONE, and atorvastatin. Additionally, I am having her maintain her ALPRAZolam, Vitamin D, albuterol, calcium-vitamin D, albuterol, and Trelegy Ellipta.  Meds ordered this encounter  Medications  . predniSONE (DELTASONE) 10 MG tablet    Sig: Take 3 tablets (30 mg total) by mouth 2 (two) times daily with a meal for 3 days.    Dispense:  18 tablet    Refill:  0  . predniSONE (DELTASONE) 5 MG tablet    Sig: Take 3 tablets (15 mg total) by mouth 2 (two) times daily with a meal for 3 days.    Dispense:  18 tablet    Refill:  0  . predniSONE (DELTASONE) 2.5 MG tablet    Sig: Take 3 tablets (7.5 mg total) by mouth 2 (two) times daily with a meal for 3 days.    Dispense:  18 tablet    Refill:  0  . valACYclovir (VALTREX) 1000 MG tablet    Sig: Take 1 tablet (1,000 mg total) by mouth 3 (three) times daily for 7 days.    Dispense:  21 tablet    Refill:  1  . fluocinonide-emollient (LIDEX-E) 0.05 % cream    Sig: Apply 1 application topically 2 (two) times daily.    Dispense:  60 g    Refill:  1  . oxyCODONE (OXY IR/ROXICODONE) 5 MG immediate  release tablet    Sig: Take 1 tablet (5 mg total) by mouth every 4 (four) hours as needed for up to 5 days for severe pain.    Dispense:  30 tablet    Refill:  0  . atorvastatin (LIPITOR) 20 MG tablet    Sig: Take 1 tablet (20 mg total) by mouth daily.    Dispense:  90 tablet    Refill:  1     Follow-up: Return in about 3 weeks (around 10/14/2019).  Scarlette Calico, MD

## 2019-09-24 ENCOUNTER — Encounter: Payer: Self-pay | Admitting: Internal Medicine

## 2019-09-24 LAB — HEPATITIS C ANTIBODY
Hepatitis C Ab: NONREACTIVE
SIGNAL TO CUT-OFF: 0.01 (ref <1.00)

## 2019-09-24 LAB — HIV ANTIBODY (ROUTINE TESTING W REFLEX): HIV 1&2 Ab, 4th Generation: NONREACTIVE

## 2019-09-24 MED ORDER — ATORVASTATIN CALCIUM 20 MG PO TABS: 20.0000 mg | ORAL_TABLET | Freq: Every day | ORAL | 1 refills | Status: DC

## 2019-10-03 ENCOUNTER — Other Ambulatory Visit: Payer: Self-pay | Admitting: Pulmonary Disease

## 2019-10-03 DIAGNOSIS — J441 Chronic obstructive pulmonary disease with (acute) exacerbation: Secondary | ICD-10-CM

## 2019-10-16 ENCOUNTER — Telehealth: Payer: Self-pay | Admitting: Pulmonary Disease

## 2019-10-16 DIAGNOSIS — R0609 Other forms of dyspnea: Secondary | ICD-10-CM

## 2019-10-16 DIAGNOSIS — J441 Chronic obstructive pulmonary disease with (acute) exacerbation: Secondary | ICD-10-CM

## 2019-10-16 DIAGNOSIS — R0602 Shortness of breath: Secondary | ICD-10-CM

## 2019-10-16 DIAGNOSIS — J432 Centrilobular emphysema: Secondary | ICD-10-CM

## 2019-10-16 MED ORDER — ALBUTEROL SULFATE (2.5 MG/3ML) 0.083% IN NEBU: 2.5000 mg | INHALATION_SOLUTION | RESPIRATORY_TRACT | 5 refills | Status: DC | PRN

## 2019-10-16 NOTE — Telephone Encounter (Signed)
Prescription sent to  Pharmacy. Called the patient to make her aware. Nothing further needed at this time.

## 2019-10-27 ENCOUNTER — Other Ambulatory Visit: Payer: Self-pay | Admitting: Pulmonary Disease

## 2019-10-27 DIAGNOSIS — J441 Chronic obstructive pulmonary disease with (acute) exacerbation: Secondary | ICD-10-CM

## 2019-11-05 ENCOUNTER — Ambulatory Visit: Payer: BLUE CROSS/BLUE SHIELD | Admitting: Women's Health

## 2019-12-12 ENCOUNTER — Other Ambulatory Visit: Payer: Self-pay | Admitting: Pulmonary Disease

## 2019-12-12 DIAGNOSIS — J441 Chronic obstructive pulmonary disease with (acute) exacerbation: Secondary | ICD-10-CM

## 2020-01-08 ENCOUNTER — Other Ambulatory Visit: Payer: Self-pay | Admitting: *Deleted

## 2020-01-08 DIAGNOSIS — J441 Chronic obstructive pulmonary disease with (acute) exacerbation: Secondary | ICD-10-CM

## 2020-01-08 MED ORDER — VENTOLIN HFA 108 (90 BASE) MCG/ACT IN AERS: INHALATION_SPRAY | RESPIRATORY_TRACT | 3 refills | Status: DC

## 2020-01-20 ENCOUNTER — Other Ambulatory Visit: Payer: Self-pay | Admitting: Pulmonary Disease

## 2020-01-20 DIAGNOSIS — J441 Chronic obstructive pulmonary disease with (acute) exacerbation: Secondary | ICD-10-CM

## 2020-01-23 ENCOUNTER — Ambulatory Visit (INDEPENDENT_AMBULATORY_CARE_PROVIDER_SITE_OTHER): Payer: 59 | Admitting: Adult Health

## 2020-01-23 ENCOUNTER — Other Ambulatory Visit: Payer: Self-pay

## 2020-01-23 ENCOUNTER — Encounter: Payer: Self-pay | Admitting: Adult Health

## 2020-01-23 DIAGNOSIS — J432 Centrilobular emphysema: Secondary | ICD-10-CM

## 2020-01-23 DIAGNOSIS — J441 Chronic obstructive pulmonary disease with (acute) exacerbation: Secondary | ICD-10-CM

## 2020-01-23 DIAGNOSIS — R0600 Dyspnea, unspecified: Secondary | ICD-10-CM

## 2020-01-23 DIAGNOSIS — R0609 Other forms of dyspnea: Secondary | ICD-10-CM

## 2020-01-23 DIAGNOSIS — R0602 Shortness of breath: Secondary | ICD-10-CM

## 2020-01-23 MED ORDER — DOXYCYCLINE HYCLATE 100 MG PO TABS: 100.0000 mg | ORAL_TABLET | Freq: Two times a day (BID) | ORAL | 0 refills | Status: DC

## 2020-01-23 MED ORDER — ALBUTEROL SULFATE (2.5 MG/3ML) 0.083% IN NEBU: 2.5000 mg | INHALATION_SOLUTION | RESPIRATORY_TRACT | 5 refills | Status: DC | PRN

## 2020-01-23 MED ORDER — TRELEGY ELLIPTA 100-62.5-25 MCG/INH IN AEPB: 1.0000 | INHALATION_SPRAY | Freq: Every day | RESPIRATORY_TRACT | 5 refills | Status: DC

## 2020-01-23 MED ORDER — PREDNISONE 10 MG PO TABS: ORAL_TABLET | ORAL | 0 refills | Status: DC

## 2020-01-23 NOTE — Patient Instructions (Addendum)
Doxycycline 100mg  Twice daily  For 7 days , take with food. Wear sunscreen  Prednisone taper over next week.  Mucinex DM Twice daily  As needed  Cough/congestion .  Continue on TRELEGY 1 puff daily .  Albuterol inhaler or neb As needed   Great job on not smoking , keep up good work.  Follow up with Dr. Valeta Harms in 6 weeks and As needed   Please contact office for sooner follow up if symptoms do not improve or worsen or seek emergency care

## 2020-01-23 NOTE — Progress Notes (Signed)
Virtual Visit via Telephone Note  I connected with Lynn Kline on 01/23/20 at  9:00 AM EDT by telephone and verified that I am speaking with the correct person using two identifiers.  Location: Patient: Home  Provider: Home    I discussed the limitations, risks, security and privacy concerns of performing an evaluation and management service by telephone and the availability of in person appointments. I also discussed with the patient that there may be a patient responsible charge related to this service. The patient expressed understanding and agreed to proceed.   History of Present Illness: 64 yo female former smoker (quit 12/2019) followed for moderate COPD .   Today's televisit is for an acute office visit. Complains of 10 days of increased cough, congestion , thick mucus, wheezing and increased dyspnea . Increased albuterol nebulizer usage. Quit smoking 1 month.  No fever, loss of taste or smell , hemoptysis , v/d. No known sick contact exposure.  Has tried Mucinex without much help.  No covid vaccine yet.  No recent antibiotics or travel.   Patient Active Problem List   Diagnosis Date Noted  . Need for hepatitis C screening test 09/23/2019  . Acute hand eczema 09/23/2019  . Herpes zoster without complication [id number]  . Gallstones 11/06/2014  . Hyperlipidemia with target LDL less than 130 08/19/2014  . Cervical cancer screening 08/19/2014  . Dysphagia, pharyngoesophageal phase 08/12/2014  . IBS (irritable bowel syndrome) 08/12/2014  . Visit for screening mammogram 12/14/2011  . Routine general medical examination at a health care facility 12/14/2011  . COPD (chronic obstructive pulmonary disease) ([place]) 11/15/2011  . Tobacco abuse 11/15/2011  . Anxiety 11/15/2011   Current Outpatient Medications on File Prior to Visit  Medication Sig Dispense Refill  . albuterol (PROVENTIL) (2.5 MG/3ML) 0.083% nebulizer solution Take 3 mLs (2.5 mg total) by nebulization every 4 (four)  hours as needed for wheezing or shortness of breath. 75 mL 5  . ALPRAZolam (XANAX) 0.25 MG tablet Take 1 tablet (0.25 mg total) by mouth 2 (two) times daily as needed for anxiety. 20 tablet 0  . atorvastatin (LIPITOR) 20 MG tablet Take 1 tablet (20 mg total) by mouth daily. 90 tablet 1  . calcium-vitamin D 250-100 MG-UNIT tablet Take 1 tablet by mouth daily. 30 tablet 5  . Cholecalciferol (VITAMIN D) 50 MCG (2000 UT) CAPS Take 1 capsule (2,000 Units total) by mouth daily. 30 capsule 5  . fluocinonide-emollient (LIDEX-E) 0.05 % cream Apply 1 application topically 2 (two) times daily. 60 g 1  . Fluticasone-Umeclidin-Vilant (TRELEGY ELLIPTA) 100-62.5-25 MCG/INH AEPB Inhale 1 puff into the lungs daily. 60 each 5  . VENTOLIN HFA 108 (90 Base) MCG/ACT inhaler INHALE 1-2 PUFFS INTO THE LUNGS EVERY 6 (SIX) HOURS AS NEEDED FOR WHEEZING OR SHORTNESS OF BREATH. 18 g 3   No current facility-administered medications on file prior to visit.      Observations/Objective: Speaks in full sentences with no audible wheezing/distress   Assessment and Plan: COPD exacerbation -   Smoking cessation -great job quitting smoking .   Plan   Patient Instructions  Doxycycline 100mg  Twice daily  For 7 days , take with food. Wear sunscreen  Prednisone taper over next week.  Mucinex DM Twice daily  As needed  Cough/congestion .  Continue on TRELEGY 1 puff daily .  Albuterol inhaler or neb As needed   Follow up with Dr. Valeta Harms in 6 weeks and As needed   Please contact office for sooner follow up  if symptoms do not improve or worsen or seek emergency care       Follow Up Instructions: Follow up in 6 weeks and As needed     I discussed the assessment and treatment plan with the patient. The patient was provided an opportunity to ask questions and all were answered. The patient agreed with the plan and demonstrated an understanding of the instructions.   The patient was advised to call back or seek an in-person  evaluation if the symptoms worsen or if the condition fails to improve as anticipated.  I provided 22  minutes of non-face-to-face time during this encounter.   [name], NP

## 2020-02-05 NOTE — Progress Notes (Signed)
PCCM: thanks for seeing her [facility] 02/05/2020 4:00 PM

## 2020-02-26 ENCOUNTER — Encounter: Payer: Self-pay | Admitting: Pulmonary Disease

## 2020-02-26 ENCOUNTER — Other Ambulatory Visit: Payer: Self-pay

## 2020-02-26 ENCOUNTER — Ambulatory Visit: Payer: 59 | Admitting: Pulmonary Disease

## 2020-02-26 VITALS — BP 128/82 | HR 89 | Temp 98.0°F | Ht 62.0 in | Wt 156.2 lb

## 2020-02-26 DIAGNOSIS — J441 Chronic obstructive pulmonary disease with (acute) exacerbation: Secondary | ICD-10-CM

## 2020-02-26 DIAGNOSIS — Z87891 Personal history of nicotine dependence: Secondary | ICD-10-CM

## 2020-02-26 DIAGNOSIS — R0600 Dyspnea, unspecified: Secondary | ICD-10-CM

## 2020-02-26 DIAGNOSIS — J432 Centrilobular emphysema: Secondary | ICD-10-CM

## 2020-02-26 DIAGNOSIS — R0602 Shortness of breath: Secondary | ICD-10-CM

## 2020-02-26 DIAGNOSIS — R0609 Other forms of dyspnea: Secondary | ICD-10-CM

## 2020-02-26 MED ORDER — PREDNISONE 20 MG PO TABS: 40.0000 mg | ORAL_TABLET | Freq: Every day | ORAL | 0 refills | Status: AC

## 2020-02-26 NOTE — Progress Notes (Signed)
Synopsis: Referred in November 2019 for emphysema by Janith Lima, MD  Subjective:   PATIENT ID: Lynn Kline GENDER: female DOB: [birth year], MRN: [id number]  Chief Complaint  Patient presents with  . Follow-up    PMH of tobacco abuse, smoked for 45 years, she recently had a CT chest with upper lobe emphysema. She recently found out that her 3 family members on her fathers side died of pulmonary fibrosis. Father with two brothers that died of lung cancer. Father died of melanoma. She is a current smoker, 5 cigs per day. She is really trying to stop. Never had any PFTs before. She gets really short of breath when she walks. If she takes her dog for a walk she will have to stop at the end of street and rest. She has chest tightness and usually can only due a few things for a few minutes and then she has to stop and sit and rest. She makes eye glasses for a living. She lives off her ventolin inhaler.  She has been using her albuterol inhaler 8-12 times per day.  She usually goes through 3-4 albuterol inhalers per month.  OV 10/24/2018: Patient has been doing well since her last visit.  She has been smoke-free for the past 2 days.  She had PFTs completed in today's office visit that revealed an FEV1 postbronchodilator of 1.08 L, 47% predicted an RV of 190%, DLCO 73%.  Patient still describes significant dyspnea on exertion.  She is having trouble getting in from her car to the house.  She does have daily cough and sputum production which is a little bit better.  She does think that the Trelegy has made a big difference with using it each day.  Overall she is a little bit anxious about the new diagnosis of COPD.  Patient denies chest pain or hemoptysis.  OV 02/26/2020: Patient here today for follow-up.  Last seen in our office by Wyn Quaker in April 2020 followed by June 2020 and again in October 2020.  Last office visit by Patricia Nettle in April 2021 telephone visit.  Per this visit she had quit  smoking in March 2021.  Managed for moderate COPD last saw me in January 2020.  She was treated for exacerbation at that time congratulated on quitting smoking.  Treated with prednisone and doxycycline.  Continued recommendations for Trelegy inhaler.  Here today for follow-up after recent exacerbation.  Again today feels short of breath.  She has had significant issues with exertion.  Unfortunately she has occasionally went back to smoke a few times.  But has not smoked in the past week or more.  She is still using her Trelegy but has occasionally not use this to see if she can get by without having an inhaler.  We talked about portance of staying on regular maintenance inhaler.   Past Medical History:  Diagnosis Date  . Anxiety   . Atherosclerosis   . Colon polyp, hyperplastic   . COPD (chronic obstructive pulmonary disease) ([place])   . Diverticulosis   . Gallstone      Family History  Problem Relation Age of Onset  . Arthritis Mother   . COPD Mother   . Emphysema Mother   . Stroke Father   . Heart disease Father   . Cancer Father        MELANOMA  . Breast cancer Maternal Grandmother   . Colon cancer Neg Hx      Past Surgical History:  Procedure Laterality Date  . ABDOMINAL HYSTERECTOMY      Social History   Socioeconomic History  . Marital status: Divorced    Spouse name: Not on file  . Number of children: Not on file  . Years of education: Not on file  . Highest education level: Not on file  Occupational History  . Not on file  Tobacco Use  . Smoking status: Former Smoker    Packs/day: 1.00    Years: 50.00    Pack years: 50.00    Types: Cigarettes    Quit date: 12/16/2019    Years since quitting: 0.1  . Smokeless tobacco: Never Used  . Tobacco comment: patient quit in March 2021  Substance and Sexual Activity  . Alcohol use: No    Alcohol/week: 0.0 standard drinks  . Drug use: No  . Sexual activity: Never    Comment: 1ST INTERCOURSE- 59, PARTNERS- 4  Other  Topics Concern  . Not on file  Social History Narrative  . Not on file   Social Determinants of Health   Financial Resource Strain:   . Difficulty of Paying Living Expenses:   Food Insecurity:   . Worried About Charity fundraiser in the Last Year:   . Arboriculturist in the Last Year:   Transportation Needs:   . Film/video editor (Medical):   Marland Kitchen Lack of Transportation (Non-Medical):   Physical Activity:   . Days of Exercise per Week:   . Minutes of Exercise per Session:   Stress:   . Feeling of Stress :   Social Connections:   . Frequency of Communication with Friends and Family:   . Frequency of Social Gatherings with Friends and Family:   . Attends Religious Services:   . Active Member of Clubs or Organizations:   . Attends Archivist Meetings:   Marland Kitchen Marital Status:   Intimate Partner Violence:   . Fear of Current or Ex-Partner:   . Emotionally Abused:   Marland Kitchen Physically Abused:   . Sexually Abused:      Allergies  Allergen Reactions  . Codeine Nausea And Vomiting  . Erythromycin Nausea And Vomiting  . Ampicillin Nausea And Vomiting, Rash and Other (See Comments)    Pt states that rash was on her tongue.  Has patient had a PCN reaction causing immediate rash, facial/tongue/throat swelling, SOB or lightheadedness with hypotension yes Has patient had a PCN reaction causing severe rash involving mucus membranes or skin necrosis: no Has patient had a PCN reaction that required hospitalization yes Has patient had a PCN reaction occurring within the last 10 years: no If all of the above answers are "NO", then may proceed with Cephalosporin use.      Outpatient Medications Prior to Visit  Medication Sig Dispense Refill  . albuterol (PROVENTIL) (2.5 MG/3ML) 0.083% nebulizer solution Take 3 mLs (2.5 mg total) by nebulization every 4 (four) hours as needed for wheezing or shortness of breath. 75 mL 5  . calcium-vitamin D 250-100 MG-UNIT tablet Take 1 tablet by  mouth daily. 30 tablet 5  . Cholecalciferol (VITAMIN D) 50 MCG (2000 UT) CAPS Take 1 capsule (2,000 Units total) by mouth daily. 30 capsule 5  . fluocinonide-emollient (LIDEX-E) 0.05 % cream Apply 1 application topically 2 (two) times daily. 60 g 1  . Fluticasone-Umeclidin-Vilant (TRELEGY ELLIPTA) 100-62.5-25 MCG/INH AEPB Inhale 1 puff into the lungs daily. 60 each 5  . VENTOLIN HFA 108 (90 Base) MCG/ACT inhaler INHALE 1-2 PUFFS  INTO THE LUNGS EVERY 6 (SIX) HOURS AS NEEDED FOR WHEEZING OR SHORTNESS OF BREATH. 18 g 3  . doxycycline (VIBRA-TABS) 100 MG tablet Take 1 tablet (100 mg total) by mouth 2 (two) times daily. 14 tablet 0  . predniSONE (DELTASONE) 10 MG tablet 4 tabs for 2 days, then 3 tabs for 2 days, 2 tabs for 2 days, then 1 tab for 2 days, then stop 20 tablet 0  . ALPRAZolam (XANAX) 0.25 MG tablet Take 1 tablet (0.25 mg total) by mouth 2 (two) times daily as needed for anxiety. (Patient not taking: Reported on 02/26/2020) 20 tablet 0  . atorvastatin (LIPITOR) 20 MG tablet Take 1 tablet (20 mg total) by mouth daily. (Patient not taking: Reported on 02/26/2020) 90 tablet 1   No facility-administered medications prior to visit.    Review of Systems  Constitutional: Negative for chills, fever, malaise/fatigue and weight loss.  HENT: Negative for hearing loss, sore throat and tinnitus.   Eyes: Negative for blurred vision and double vision.  Respiratory: Positive for cough and shortness of breath. Negative for hemoptysis, sputum production, wheezing and stridor.   Cardiovascular: Negative for chest pain, palpitations, orthopnea, leg swelling and PND.  Gastrointestinal: Negative for abdominal pain, constipation, diarrhea, heartburn, nausea and vomiting.  Genitourinary: Negative for dysuria, hematuria and urgency.  Musculoskeletal: Negative for joint pain and myalgias.  Skin: Negative for itching and rash.  Neurological: Negative for dizziness, tingling, weakness and headaches.   Endo/Heme/Allergies: Negative for environmental allergies. Does not bruise/bleed easily.  Psychiatric/Behavioral: Negative for depression. The patient is not nervous/anxious and does not have insomnia.   All other systems reviewed and are negative.    Objective:  Physical Exam Vitals reviewed.  Constitutional:      General: She is not in acute distress.    Appearance: She is well-developed.  HENT:     Head: Normocephalic and atraumatic.     Mouth/Throat:     Pharynx: No oropharyngeal exudate.  Eyes:     Conjunctiva/sclera: Conjunctivae normal.     Pupils: Pupils are equal, round, and reactive to light.  Neck:     Vascular: No JVD.     Trachea: No tracheal deviation.     Comments: Loss of supraclavicular fat Cardiovascular:     Rate and Rhythm: Normal rate and regular rhythm.     Heart sounds: S1 normal and S2 normal.     Comments: Distant heart tones Pulmonary:     Effort: No tachypnea or accessory muscle usage.     Breath sounds: No stridor. Decreased breath sounds (throughout all lung fields) and wheezing present. No rhonchi or rales.  Abdominal:     General: Bowel sounds are normal. There is no distension.     Palpations: Abdomen is soft.     Tenderness: There is no abdominal tenderness.  Musculoskeletal:        General: Deformity (muscle wasting ) present.  Skin:    General: Skin is warm and dry.     Capillary Refill: Capillary refill takes less than 2 seconds.     Findings: No rash.  Neurological:     Mental Status: She is alert and oriented to person, place, and time.  Psychiatric:        Behavior: Behavior normal.      Vitals:   02/26/20 0925  BP: 128/82  Pulse: 89  Temp: 98 F (36.7 C)  TempSrc: Temporal  SpO2: 95%  Weight: 156 lb 3.2 oz (70.9 kg)  Height: 5\' 2"  (  1.575 m)   95% on RA BMI Readings from Last 3 Encounters:  02/26/20 28.57 kg/m  09/23/19 29.08 kg/m  07/31/19 29.41 kg/m   Wt Readings from Last 3 Encounters:  02/26/20 156 lb  3.2 oz (70.9 kg)  09/23/19 159 lb (72.1 kg)  07/31/19 160 lb 12.8 oz (72.9 kg)     CBC    Component Value Date/Time   WBC 7.1 09/23/2019 1610   RBC 4.28 09/23/2019 1610   HGB 13.3 09/23/2019 1610   HCT 39.7 09/23/2019 1610   PLT 345.0 09/23/2019 1610   MCV 92.7 09/23/2019 1610   MCH 30.8 11/22/2016 0832   MCHC 33.5 09/23/2019 1610   RDW 14.0 09/23/2019 1610   LYMPHSABS 2.4 09/23/2019 1610   MONOABS 0.6 09/23/2019 1610   EOSABS 0.2 09/23/2019 1610   BASOSABS 0.1 09/23/2019 1610    Chest Imaging: 07/24/2018 -CT chest with evidence of mild upper lobe predominant emphysematous change..  Pulmonary Functions Testing Results: PFT Results Latest Ref Rng & Units 10/24/2018  FVC-Pre L 2.22  FVC-Predicted Pre % 74  FVC-Post L 2.15  FVC-Predicted Post % 72  Pre FEV1/FVC % % 52  Post FEV1/FCV % % 50  FEV1-Pre L 1.17  FEV1-Predicted Pre % 51  FEV1-Post L 1.08  DLCO UNC% % 73  DLCO COR %Predicted % 83  TLC L 6.19  TLC % Predicted % 130  RV % Predicted % 190    FeNO: None   Pathology: None   Echocardiogram: None   Heart Catheterization: None    Assessment & Plan:   Centrilobular emphysema (HCC)  Acute exacerbation of chronic obstructive pulmonary disease (COPD) (HCC)  DOE (dyspnea on exertion)  Shortness of breath  Former smoker  Discussion:  64 year old female, states that she recently quit smoking but unfortunately has had some breakthrough with stress.  PFTs with evidence of severe COPD.  DEXA scan with osteopenia.  Prior PFTs with an FEV1 of 1.08 L, 47% predicted.  Currently experiencing exacerbation of her COPD.  She is very tight today and wheezing.  Plan: Treat COPD exacerbation with 5 days prednisone 40 mg Continue Trelegy inhaler Continue vitamin D supplementation She must quit smoking. We discussed this in detail again today. Repeat low-dose lung cancer screening CT in October 2021 planned. Patient to follow-up with me after this  appointment.     Current Outpatient Medications:  .  albuterol (PROVENTIL) (2.5 MG/3ML) 0.083% nebulizer solution, Take 3 mLs (2.5 mg total) by nebulization every 4 (four) hours as needed for wheezing or shortness of breath., Disp: 75 mL, Rfl: 5 .  calcium-vitamin D 250-100 MG-UNIT tablet, Take 1 tablet by mouth daily., Disp: 30 tablet, Rfl: 5 .  Cholecalciferol (VITAMIN D) 50 MCG (2000 UT) CAPS, Take 1 capsule (2,000 Units total) by mouth daily., Disp: 30 capsule, Rfl: 5 .  fluocinonide-emollient (LIDEX-E) 0.05 % cream, Apply 1 application topically 2 (two) times daily., Disp: 60 g, Rfl: 1 .  Fluticasone-Umeclidin-Vilant (TRELEGY ELLIPTA) 100-62.5-25 MCG/INH AEPB, Inhale 1 puff into the lungs daily., Disp: 60 each, Rfl: 5 .  VENTOLIN HFA 108 (90 Base) MCG/ACT inhaler, INHALE 1-2 PUFFS INTO THE LUNGS EVERY 6 (SIX) HOURS AS NEEDED FOR WHEEZING OR SHORTNESS OF BREATH., Disp: 18 g, Rfl: 3 .  ALPRAZolam (XANAX) 0.25 MG tablet, Take 1 tablet (0.25 mg total) by mouth 2 (two) times daily as needed for anxiety. (Patient not taking: Reported on 02/26/2020), Disp: 20 tablet, Rfl: 0 .  atorvastatin (LIPITOR) 20 MG tablet, Take 1  tablet (20 mg total) by mouth daily. (Patient not taking: Reported on 02/26/2020), Disp: 90 tablet, Rfl: 1 .  predniSONE (DELTASONE) 20 MG tablet, Take 2 tablets (40 mg total) by mouth daily with breakfast for 5 days., Disp: 10 tablet, Rfl: 0   Garner Nash, DO [facility] 02/26/2020 10:10 AM

## 2020-02-26 NOTE — Patient Instructions (Addendum)
Thank you for visiting Dr. Valeta Harms at [facility]. Today we recommend the following:  Continue trelegy  Start regular exercise routine  Continue albuterol as needed Congrats on quitting smoking Low dose lung cancer screening in October, follow up with me after this Prednisone 40mg  X5 days    Return in about 6 months (around 08/28/2020) for w/ Dr. Valeta Harms .    Please do your part to reduce the spread of COVID-19.

## 2020-03-03 ENCOUNTER — Other Ambulatory Visit: Payer: Self-pay | Admitting: *Deleted

## 2020-03-19 ENCOUNTER — Telehealth: Payer: Self-pay | Admitting: Pulmonary Disease

## 2020-03-19 NOTE — Telephone Encounter (Signed)
I spoke with Mitzi Hansen at the pharmacy and authorized him to fill what her insurance preference for albuterol. He understood and nothing further is needed.

## 2020-03-24 ENCOUNTER — Other Ambulatory Visit: Payer: Self-pay | Admitting: *Deleted

## 2020-03-24 DIAGNOSIS — J441 Chronic obstructive pulmonary disease with (acute) exacerbation: Secondary | ICD-10-CM

## 2020-03-24 MED ORDER — VENTOLIN HFA 108 (90 BASE) MCG/ACT IN AERS: INHALATION_SPRAY | RESPIRATORY_TRACT | 3 refills | Status: DC

## 2020-05-25 ENCOUNTER — Telehealth: Payer: Self-pay | Admitting: Pulmonary Disease

## 2020-05-25 NOTE — Telephone Encounter (Signed)
Spoke with the pt  She is c/o SOB, wheezing, weakness, fatigue, HA, backache, neck ache, fever--onset 5 days ago  Temp ranges from 99.6 - 100.7  She states that she has been taking tylenol ES for these symptoms without relief  She has no pulse ox so unsure about what her sats are  She has NOT have covid vaccine  Denies sick contacts or recent travel  Dr Valeta Harms out of office  Forwarding to APP of the day

## 2020-05-25 NOTE — Telephone Encounter (Signed)
Would recommend the patient obtain the outpatient Covid testing.  Please encourage the patient to schedule this.  They can do this either through [facility] health website or through a local [place].  Wyn Quaker, FNP

## 2020-05-25 NOTE — Telephone Encounter (Signed)
Spoke with patient and provided recommendations per Wyn Quaker NP, she will go to [facility] near her to be tested.  Advised to quarantine until she receives the results and to continue to use her inhalers/nebulizers as prescribed as well as Tylenol/ibuprofen for fever/discomfort.  She will call us with the results of the Covid test and if her s/s change.

## 2020-05-26 DIAGNOSIS — Z20822 Contact with and (suspected) exposure to covid-19: Secondary | ICD-10-CM | POA: Diagnosis not present

## 2020-05-28 ENCOUNTER — Telehealth: Payer: Self-pay | Admitting: Pulmonary Disease

## 2020-05-28 MED ORDER — DOXYCYCLINE HYCLATE 100 MG PO TABS: 100.0000 mg | ORAL_TABLET | Freq: Two times a day (BID) | ORAL | 0 refills | Status: DC

## 2020-05-28 NOTE — Telephone Encounter (Signed)
Spoke with the pt and notified of response per Tammy  She states that she is upset that we can not offer her appt now and will try to get in with PCP  I have sent in rx for Doxy  She states not wanting to schedule 2 wk appt at this time bc she wants to see what her PCP says

## 2020-05-28 NOTE — Telephone Encounter (Signed)
No available ov at pulmonary  If can not go to PCP or urgent care  REC : following   Would consider repeat Covid testing in 2 days if still running fever.   Can call in Doxcycline 100mg  Twice daily  #14 , take with food, use sunscreen  Cont maint regimen  Tylenol /fluids/rest /mucinex   Needs ov in 2 weeks with CXR .   If not imrpovoing or worsens will need to go to ER for eval  Please contact office for sooner follow up if symptoms do not improve or worsen or seek emergency care

## 2020-05-28 NOTE — Telephone Encounter (Signed)
Called and spoke with pt who stated she had been having a fever ranging from 99-100.7 x2.5 days but then fever broke Sunday 8/8  Asked pt what her temp was today 8/12 and she stated that it was 98.6. pt has not taken any tylenol today.  Pt states she had been taking tylenol extra strength and states last day she took tylenol was yesterday 8/11.   Pt has had to use her nebulizer at least three times daily since her symptoms began. Pt states she has been having to use her ventolin inhaler 2-3 times daily. Pt is also using her Trelegy daily as prescribed.  Pt still has complaints of increased SOB, chest congestion, and also has complaints of pain in her back, ribs, and wonders if the pna has come back. Pt also has a mild cough and is coughing up occ yellow tinged phlegm. Pt denies any complaints of wheezing.  Not showing in immunization tab that pt had received covid vaccine. Asked pt if she had received vaccine yet and she stated that she had not.  [facility] and received results yesterday 8/11 and results came back negative.   Pt wants to know what we recommend. Tammy, please advise.

## 2020-06-05 ENCOUNTER — Other Ambulatory Visit: Payer: Self-pay | Admitting: *Deleted

## 2020-06-05 DIAGNOSIS — J441 Chronic obstructive pulmonary disease with (acute) exacerbation: Secondary | ICD-10-CM

## 2020-06-05 DIAGNOSIS — J432 Centrilobular emphysema: Secondary | ICD-10-CM

## 2020-06-05 DIAGNOSIS — R0609 Other forms of dyspnea: Secondary | ICD-10-CM

## 2020-06-05 DIAGNOSIS — R0602 Shortness of breath: Secondary | ICD-10-CM

## 2020-06-05 MED ORDER — VENTOLIN HFA 108 (90 BASE) MCG/ACT IN AERS: INHALATION_SPRAY | RESPIRATORY_TRACT | 3 refills | Status: DC

## 2020-07-20 ENCOUNTER — Other Ambulatory Visit: Payer: Self-pay | Admitting: Adult Health

## 2020-07-28 ENCOUNTER — Other Ambulatory Visit: Payer: Self-pay | Admitting: *Deleted

## 2020-07-28 DIAGNOSIS — Z87891 Personal history of nicotine dependence: Secondary | ICD-10-CM

## 2020-07-28 DIAGNOSIS — F17210 Nicotine dependence, cigarettes, uncomplicated: Secondary | ICD-10-CM

## 2020-08-10 ENCOUNTER — Encounter: Payer: Self-pay | Admitting: Internal Medicine

## 2020-08-10 ENCOUNTER — Ambulatory Visit (INDEPENDENT_AMBULATORY_CARE_PROVIDER_SITE_OTHER): Payer: Medicare Other

## 2020-08-10 ENCOUNTER — Other Ambulatory Visit: Payer: Self-pay

## 2020-08-10 ENCOUNTER — Ambulatory Visit (INDEPENDENT_AMBULATORY_CARE_PROVIDER_SITE_OTHER): Payer: Medicare Other | Admitting: Internal Medicine

## 2020-08-10 VITALS — BP 116/70 | HR 82 | Temp 98.6°F | Ht 62.0 in | Wt 151.0 lb

## 2020-08-10 DIAGNOSIS — Z0000 Encounter for general adult medical examination without abnormal findings: Secondary | ICD-10-CM

## 2020-08-10 DIAGNOSIS — R0609 Other forms of dyspnea: Secondary | ICD-10-CM | POA: Insufficient documentation

## 2020-08-10 DIAGNOSIS — E785 Hyperlipidemia, unspecified: Secondary | ICD-10-CM

## 2020-08-10 DIAGNOSIS — R0600 Dyspnea, unspecified: Secondary | ICD-10-CM

## 2020-08-10 DIAGNOSIS — Z720 Tobacco use: Secondary | ICD-10-CM | POA: Diagnosis not present

## 2020-08-10 DIAGNOSIS — Z23 Encounter for immunization: Secondary | ICD-10-CM | POA: Insufficient documentation

## 2020-08-10 DIAGNOSIS — R058 Other specified cough: Secondary | ICD-10-CM

## 2020-08-10 DIAGNOSIS — R0782 Intercostal pain: Secondary | ICD-10-CM | POA: Insufficient documentation

## 2020-08-10 DIAGNOSIS — R7989 Other specified abnormal findings of blood chemistry: Secondary | ICD-10-CM | POA: Insufficient documentation

## 2020-08-10 DIAGNOSIS — J441 Chronic obstructive pulmonary disease with (acute) exacerbation: Secondary | ICD-10-CM

## 2020-08-10 DIAGNOSIS — R059 Cough, unspecified: Secondary | ICD-10-CM | POA: Diagnosis not present

## 2020-08-10 DIAGNOSIS — Z124 Encounter for screening for malignant neoplasm of cervix: Secondary | ICD-10-CM

## 2020-08-10 DIAGNOSIS — F419 Anxiety disorder, unspecified: Secondary | ICD-10-CM | POA: Diagnosis not present

## 2020-08-10 DIAGNOSIS — I2584 Coronary atherosclerosis due to calcified coronary lesion: Secondary | ICD-10-CM

## 2020-08-10 DIAGNOSIS — J189 Pneumonia, unspecified organism: Secondary | ICD-10-CM | POA: Insufficient documentation

## 2020-08-10 DIAGNOSIS — I700 Atherosclerosis of aorta: Secondary | ICD-10-CM

## 2020-08-10 DIAGNOSIS — Z1231 Encounter for screening mammogram for malignant neoplasm of breast: Secondary | ICD-10-CM

## 2020-08-10 DIAGNOSIS — I2510 Atherosclerotic heart disease of native coronary artery without angina pectoris: Secondary | ICD-10-CM

## 2020-08-10 LAB — HEPATIC FUNCTION PANEL
ALT: 9 U/L (ref 0–35)
AST: 13 U/L (ref 0–37)
Albumin: 4 g/dL (ref 3.5–5.2)
Alkaline Phosphatase: 84 U/L (ref 39–117)
Bilirubin, Direct: 0.1 mg/dL (ref 0.0–0.3)
Total Bilirubin: 0.3 mg/dL (ref 0.2–1.2)
Total Protein: 6.6 g/dL (ref 6.0–8.3)

## 2020-08-10 LAB — LIPID PANEL
Cholesterol: 188 mg/dL (ref 0–200)
HDL: 51.7 mg/dL (ref >39.00)
LDL Cholesterol: 119 mg/dL — ABNORMAL HIGH (ref 0–99)
NonHDL: 135.99
Total CHOL/HDL Ratio: 4
Triglycerides: 86 mg/dL (ref 0.0–149.0)
VLDL: 17.2 mg/dL (ref 0.0–40.0)

## 2020-08-10 LAB — TROPONIN I (HIGH SENSITIVITY): High Sens Troponin I: 4 ng/L (ref 2–17)

## 2020-08-10 LAB — BASIC METABOLIC PANEL
BUN: 15 mg/dL (ref 6–23)
CO2: 29 mEq/L (ref 19–32)
Calcium: 8.9 mg/dL (ref 8.4–10.5)
Chloride: 103 mEq/L (ref 96–112)
Creatinine, Ser: 0.73 mg/dL (ref 0.40–1.20)
GFR: 86.4 mL/min (ref >60.00)
Glucose, Bld: 100 mg/dL — ABNORMAL HIGH (ref 70–99)
Potassium: 3.6 mEq/L (ref 3.5–5.1)
Sodium: 137 mEq/L (ref 135–145)

## 2020-08-10 LAB — CBC WITH DIFFERENTIAL/PLATELET
Basophils Absolute: 0.1 10*3/uL (ref 0.0–0.1)
Basophils Relative: 1 % (ref 0.0–3.0)
Eosinophils Absolute: 0.2 10*3/uL (ref 0.0–0.7)
Eosinophils Relative: 2.6 % (ref 0.0–5.0)
HCT: 39.8 % (ref 36.0–46.0)
Hemoglobin: 13.4 g/dL (ref 12.0–15.0)
Lymphocytes Relative: 38.6 % (ref 12.0–46.0)
Lymphs Abs: 2.8 10*3/uL (ref 0.7–4.0)
MCHC: 33.8 g/dL (ref 30.0–36.0)
MCV: 89.8 fl (ref 78.0–100.0)
Monocytes Absolute: 0.4 10*3/uL (ref 0.1–1.0)
Monocytes Relative: 5.8 % (ref 3.0–12.0)
Neutro Abs: 3.7 10*3/uL (ref 1.4–7.7)
Neutrophils Relative %: 52 % (ref 43.0–77.0)
Platelets: 373 10*3/uL (ref 150.0–400.0)
RBC: 4.43 Mil/uL (ref 3.87–5.11)
RDW: 13.8 % (ref 11.5–15.5)
WBC: 7.2 10*3/uL (ref 4.0–10.5)

## 2020-08-10 LAB — PROTIME-INR
INR: 1 ratio (ref 0.8–1.0)
Prothrombin Time: 10.9 s (ref 9.6–13.1)

## 2020-08-10 LAB — TSH: TSH: 1.22 u[IU]/mL (ref 0.35–4.50)

## 2020-08-10 LAB — BRAIN NATRIURETIC PEPTIDE: Pro B Natriuretic peptide (BNP): 38 pg/mL (ref 0.0–100.0)

## 2020-08-10 MED ORDER — DEXTROMETHORPHAN POLISTIREX ER 30 MG/5ML PO SUER: 30.0000 mg | Freq: Two times a day (BID) | ORAL | 1 refills | Status: DC

## 2020-08-10 MED ORDER — SHINGRIX 50 MCG/0.5ML IM SUSR: 0.5000 mL | Freq: Once | INTRAMUSCULAR | 1 refills | Status: AC

## 2020-08-10 MED ORDER — ALPRAZOLAM 0.25 MG PO TABS: 0.2500 mg | ORAL_TABLET | Freq: Two times a day (BID) | ORAL | 1 refills | Status: DC | PRN

## 2020-08-10 MED ORDER — XENLETA 600 MG PO TABS: 1.0000 | ORAL_TABLET | Freq: Two times a day (BID) | ORAL | 0 refills | Status: AC

## 2020-08-10 NOTE — Progress Notes (Signed)
Subjective:  Patient ID: Lynn Kline, female    DOB: 10/17/1956  Age: 64 y.o. MRN: [id number]  CC: Annual Exam, Hyperlipidemia, COPD, and Cough  This visit occurred during the SARS-CoV-2 public health emergency.  Safety protocols were in place, including screening questions prior to the visit, additional usage of staff PPE, and extensive cleaning of exam room while observing appropriate contact time as indicated for disinfecting solutions.    HPI Lynn Kline presents for a CPX.  She has felt poorly for at least a month.  She has had persistent cough productive of white phlegm.  She recently saw a pulmonologist and was prescribed doxycycline and prednisone.  She tells me that that combination did not help much.  She complains of shortness of breath with exertion, chest pain, fatigue, wheezing, and rattling in her chest.  She is getting symptom relief with her current inhaler.  She denies hemoptysis, fever, chills, or night sweats.  She takes Aleve for musculoskeletal pain.  Outpatient Medications Prior to Visit  Medication Sig Dispense Refill  . albuterol (PROVENTIL) (2.5 MG/3ML) 0.083% nebulizer solution Take 3 mLs (2.5 mg total) by nebulization every 4 (four) hours as needed for wheezing or shortness of breath. 75 mL 5  . calcium-vitamin D 250-100 MG-UNIT tablet Take 1 tablet by mouth daily. 30 tablet 5  . Cholecalciferol (VITAMIN D) 50 MCG (2000 UT) CAPS Take 1 capsule (2,000 Units total) by mouth daily. 30 capsule 5  . doxycycline (VIBRA-TABS) 100 MG tablet Take 1 tablet (100 mg total) by mouth 2 (two) times daily. 14 tablet 0  . fluocinonide-emollient (LIDEX-E) 0.05 % cream Apply 1 application topically 2 (two) times daily. 60 g 1  . TRELEGY ELLIPTA 100-62.5-25 MCG/INH AEPB TAKE 1 PUFF BY MOUTH EVERY DAY 60 each 2  . VENTOLIN HFA 108 (90 Base) MCG/ACT inhaler INHALE 1-2 PUFFS INTO THE LUNGS EVERY 6 (SIX) HOURS AS NEEDED FOR WHEEZING OR SHORTNESS OF BREATH. 18 g 3  . ALPRAZolam  (XANAX) 0.25 MG tablet Take 1 tablet (0.25 mg total) by mouth 2 (two) times daily as needed for anxiety. 20 tablet 0  . atorvastatin (LIPITOR) 20 MG tablet Take 1 tablet (20 mg total) by mouth daily. 90 tablet 1   No facility-administered medications prior to visit.    ROS Review of Systems  Constitutional: Positive for fatigue and unexpected weight change (wt loss). Negative for appetite change, chills, diaphoresis and fever.  HENT: Negative.   Eyes: Negative for visual disturbance.  Respiratory: Positive for cough, shortness of breath and wheezing. Negative for chest tightness and stridor.   Cardiovascular: Negative for chest pain, palpitations and leg swelling.  Gastrointestinal: Negative for abdominal pain, constipation, diarrhea, nausea and vomiting.  Endocrine: Negative.   Genitourinary: Negative.  Negative for difficulty urinating and dysuria.  Musculoskeletal: Positive for arthralgias. Negative for myalgias.  Skin: Negative.  Negative for color change, pallor and rash.  Neurological: Negative.  Negative for dizziness, weakness, light-headedness, numbness and headaches.  Hematological: Negative for adenopathy. Does not bruise/bleed easily.  Psychiatric/Behavioral: Negative for agitation, behavioral problems, confusion, decreased concentration, dysphoric mood, hallucinations, self-injury, sleep disturbance and suicidal ideas. The patient is nervous/anxious.     Objective:  BP 116/70   Pulse 82   Temp 98.6 F (37 C) (Oral)   Ht 5\' 2"  (1.575 m)   Wt 151 lb (68.5 kg)   SpO2 94%   BMI 27.62 kg/m   BP Readings from Last 3 Encounters:  08/10/20 116/70  02/26/20 128/82  09/23/19 130/80    Wt Readings from Last 3 Encounters:  08/10/20 151 lb (68.5 kg)  02/26/20 156 lb 3.2 oz (70.9 kg)  09/23/19 159 lb (72.1 kg)    Physical Exam Vitals reviewed.  Constitutional:      General: She is not in acute distress.    Appearance: Normal appearance. She is not ill-appearing,  toxic-appearing or diaphoretic.  HENT:     Nose: Nose normal.     Mouth/Throat:     Mouth: Mucous membranes are moist.  Eyes:     General: No scleral icterus.    Conjunctiva/sclera: Conjunctivae normal.  Cardiovascular:     Rate and Rhythm: Normal rate and regular rhythm.     Heart sounds: S1 normal and S2 normal. No murmur heard.      Comments: EKG - NSR, 74 bpm Normal EKG Pulmonary:     Effort: Pulmonary effort is normal.     Breath sounds: No stridor. No wheezing, rhonchi or rales.  Abdominal:     General: Abdomen is flat. Bowel sounds are normal. There is no distension.     Palpations: Abdomen is soft. There is no hepatomegaly, splenomegaly or mass.     Tenderness: There is no abdominal tenderness.     Hernia: No hernia is present.  Musculoskeletal:        General: Normal range of motion.     Cervical back: Neck supple.     Right lower leg: No edema.     Left lower leg: No edema.  Lymphadenopathy:     Cervical: No cervical adenopathy.  Skin:    General: Skin is warm.     Findings: No lesion or rash.  Neurological:     General: No focal deficit present.     Mental Status: She is alert.  Psychiatric:        Mood and Affect: Mood normal.        Behavior: Behavior normal.     Lab Results  Component Value Date   WBC 7.2 08/10/2020   HGB 13.4 08/10/2020   HCT 39.8 08/10/2020   PLT 373.0 08/10/2020   GLUCOSE 100 (H) 08/10/2020   CHOL 188 08/10/2020   TRIG 86.0 08/10/2020   HDL 51.70 08/10/2020   LDLDIRECT 141.0 11/15/2011   LDLCALC 119 (H) 08/10/2020   ALT 9 08/10/2020   AST 13 08/10/2020   NA 137 08/10/2020   K 3.6 08/10/2020   CL 103 08/10/2020   CREATININE 0.73 08/10/2020   BUN 15 08/10/2020   CO2 29 08/10/2020   TSH 1.22 08/10/2020   INR 1.0 08/10/2020   HGBA1C 5.8 08/19/2014    CT CHEST LUNG CA SCREEN LOW DOSE W/O CM  Result Date: 07/31/2019 CLINICAL DATA:  64 year old female current smoker with 50 pack-year history of smoking. Lung cancer  screening examination. EXAM: CT CHEST WITHOUT CONTRAST LOW-DOSE FOR LUNG CANCER SCREENING TECHNIQUE: Multidetector CT imaging of the chest was performed following the standard protocol without IV contrast. COMPARISON:  Chest CT 07/24/2018. FINDINGS: Cardiovascular: Heart size is normal. There is no significant pericardial fluid, thickening or pericardial calcification. There is aortic atherosclerosis, as well as atherosclerosis of the great vessels of the mediastinum and the coronary arteries, including calcified atherosclerotic plaque in the right coronary artery. Mediastinum/Nodes: No pathologically enlarged mediastinal or hilar lymph nodes. Please note that accurate exclusion of hilar adenopathy is limited on noncontrast CT scans. Esophagus is unremarkable in appearance. No axillary lymphadenopathy. Lungs/Pleura: Multiple small pulmonary nodules are noted throughout both lungs,  largest of which is in the left upper lobe (axial image 68 of series 3), with a volume derived mean diameter of 3.4 mm. No larger more suspicious appearing pulmonary nodules or masses are noted. No acute consolidative airspace disease. No pleural effusions. Mild diffuse bronchial wall thickening with mild to moderate centrilobular and paraseptal emphysema. Upper Abdomen: Aortic atherosclerosis.  Status post cholecystectomy. Musculoskeletal: Chronic compression fracture of T7 with 50% loss of anterior vertebral body height. There are no aggressive appearing lytic or blastic lesions noted in the visualized portions of the skeleton. IMPRESSION: 1. Lung-RADS 2S, benign appearance or behavior. Continue annual screening with low-dose chest CT without contrast in 12 months. 2. The "S" modifier above refers to potentially clinically significant non lung cancer related findings. Specifically, there is aortic atherosclerosis, in addition to right coronary artery disease. Please note that although the presence of coronary artery calcium documents the  presence of coronary artery disease, the severity of this disease and any potential stenosis cannot be assessed on this non-gated CT examination. Assessment for potential risk factor modification, dietary therapy or pharmacologic therapy may be warranted, if clinically indicated. 3. Mild diffuse bronchial wall thickening with mild centrilobular and paraseptal emphysema; imaging findings suggestive of underlying COPD. Aortic Atherosclerosis (ICD10-I70.0) and Emphysema (ICD10-J43.9). Electronically Signed   By: Vinnie Langton M.D.   On: 07/31/2019 13:45   DG Chest 2 View  Result Date: 08/10/2020 CLINICAL DATA:  Cough and wheezing.  History COPD EXAM: CHEST - 2 VIEW COMPARISON:  07/18/2018 FINDINGS: Pulmonary hyperinflation compatible with COPD. Patchy airspace density in the left upper lobe appears new and not present on CT of 07/31/2019. Remainder the lungs are clear. No heart failure or effusion. Cardiac and mediastinal contours normal. Chronic fracture midthoracic spine unchanged. IMPRESSION: COPD. Small patchy airspace density left upper lobe is new and could represent pneumonia or mass lesion. Followup PA and lateral chest X-ray is recommended in 3-4 weeks following trial of antibiotic therapy to ensure resolution and exclude underlying malignancy. Electronically Signed   By: Franchot Gallo M.D.   On: 08/10/2020 14:28    Assessment & Plan:   Riyanshi was seen today for annual exam, hyperlipidemia, copd and cough.  Diagnoses and all orders for this visit:  Tobacco abuse- I recommended that she quit smoking and be screened for lung cancer. -     Ambulatory Referral for Lung Cancer Scre  Visit for screening mammogram -     MM DIGITAL SCREENING BILATERAL; Future  Routine general medical examination at a health care facility-exam completed, labs reviewed, vaccines reviewed and updated, cancer screenings addressed, patient education material was given.  Chronic obstructive pulmonary disease with  acute exacerbation (HCC)-she would like to continue using her current inhaler regimen. -     CBC with Differential/Platelet; Future -     CBC with Differential/Platelet  Hyperlipidemia with target LDL less than 130-I recommended she take a statin for cardiovascular risk reduction. -     Basic metabolic panel; Future -     Lipid panel; Future -     TSH; Future -     Hepatic function panel; Future -     Basic metabolic panel -     Lipid panel -     TSH -     Hepatic function panel  Need for shingles vaccine -     Zoster Vaccine Adjuvanted [facility]) injection; Inject 0.5 mLs into the muscle once for 1 dose.  Anxiety -     ALPRAZolam (XANAX) 0.25  MG tablet; Take 1 tablet (0.25 mg total) by mouth 2 (two) times daily as needed for anxiety.  Cervical cancer screening -     Ambulatory referral to Gynecology  Intercostal pain-see below. -     Basic metabolic panel; Future -     Troponin I (High Sensitivity); Future -     Brain natriuretic peptide; Future -     Basic metabolic panel -     Troponin I (High Sensitivity) -     Brain natriuretic peptide  DOE (dyspnea on exertion)- She had a low-dose CT scan done about a year ago that showed right coronary artery atherosclerosis.  Her EKG is negative for ischemia.  I recommended that she undergo a myocardial perfusion imaging to screen for coronary artery disease. -     CBC with Differential/Platelet; Future -     Troponin I (High Sensitivity); Future -     Brain natriuretic peptide; Future -     CBC with Differential/Platelet -     Troponin I (High Sensitivity) -     Brain natriuretic peptide  Cough with expectoration- See above and below. -     DG Chest 2 View; Future  Elevated LFTs- Her LFTs are normal now.  I will screen her for viral hepatitis. -     EKG 12-Lead -     Hepatic function panel; Future -     Protime-INR; Future -     Hepatitis B surface antibody,quantitative; Future -     Hepatitis B surface antigen; Future -      Hepatitis A antibody, total; Future -     Hepatic function panel -     Protime-INR -     Hepatitis B surface antibody,quantitative -     Hepatitis B surface antigen -     Hepatitis A antibody, total  Pneumonia of left upper lobe due to infectious organism- Her chest x-ray is concerning for left upper lobe pneumonia that is resistant to doxycycline.  I recommended that she try a 5-day course of lefamulin. -     Lefamulin Acetate (XENLETA) 600 MG TABS; Take 1 tablet by mouth in the morning and at bedtime for 5 days. -     dextromethorphan (DELSYM) 30 MG/5ML liquid; Take 5 mLs (30 mg total) by mouth 2 (two) times daily.   I am having Katyana L. Casanas start on Shingrix, [place], and dextromethorphan. I am also having her maintain her Vitamin D, calcium-vitamin D, fluocinonide-emollient, albuterol, doxycycline, Ventolin HFA, Trelegy Ellipta, ALPRAZolam, and atorvastatin.  Meds ordered this encounter  Medications  . Zoster Vaccine Adjuvanted [facility]) injection    Sig: Inject 0.5 mLs into the muscle once for 1 dose.    Dispense:  0.5 mL    Refill:  1  . ALPRAZolam (XANAX) 0.25 MG tablet    Sig: Take 1 tablet (0.25 mg total) by mouth 2 (two) times daily as needed for anxiety.    Dispense:  60 tablet    Refill:  1  . Lefamulin Acetate (XENLETA) 600 MG TABS    Sig: Take 1 tablet by mouth in the morning and at bedtime for 5 days.    Dispense:  10 tablet    Refill:  0  . dextromethorphan (DELSYM) 30 MG/5ML liquid    Sig: Take 5 mLs (30 mg total) by mouth 2 (two) times daily.    Dispense:  148 mL    Refill:  1  . atorvastatin (LIPITOR) 20 MG tablet    Sig:  Take 1 tablet (20 mg total) by mouth daily.    Dispense:  90 tablet    Refill:  1   In addition to time spent on CPE, I spent 50 minutes in preparing to see the patient by review of recent labs, imaging and procedures, obtaining and reviewing separately obtained history, communicating with the patient and family or caregiver, ordering  medications, tests or procedures, and documenting clinical information in the EHR including the differential Dx, treatment, and any further evaluation and other management of 1. Tobacco abuse 2. Chronic obstructive pulmonary disease with acute exacerbation ([place]) 3. Hyperlipidemia with target LDL less than 130 4. Anxiety 5. Intercostal pain 6. DOE (dyspnea on exertion) 7. Cough with expectoration 8. Elevated LFTs 9. Pneumonia of left upper lobe due to infectious organism 10. Coronary atherosclerosis due to calcified coronary lesion 11. Atherosclerosis of aorta ([place])     Follow-up: Return in about 4 weeks (around 09/07/2020).  Scarlette Calico, MD

## 2020-08-10 NOTE — Patient Instructions (Addendum)
Health Maintenance, Female Adopting a healthy lifestyle and getting preventive care are important in promoting health and wellness. Ask your health care provider about:  The right schedule for you to have regular tests and exams.  Things you can do on your own to prevent diseases and keep yourself healthy. What should I know about diet, weight, and exercise? Eat a healthy diet   Eat a diet that includes plenty of vegetables, fruits, low-fat dairy products, and lean protein.  Do not eat a lot of foods that are high in solid fats, added sugars, or sodium. Maintain a healthy weight Body mass index (BMI) is used to identify weight problems. It estimates body fat based on height and weight. Your health care provider can help determine your BMI and help you achieve or maintain a healthy weight. Get regular exercise Get regular exercise. This is one of the most important things you can do for your health. Most adults should:  Exercise for at least 150 minutes each week. The exercise should increase your heart rate and make you sweat (moderate-intensity exercise).  Do strengthening exercises at least twice a week. This is in addition to the moderate-intensity exercise.  Spend less time sitting. Even light physical activity can be beneficial. Watch cholesterol and blood lipids Have your blood tested for lipids and cholesterol at 65 years of age, then have this test every 5 years. Have your cholesterol levels checked more often if:  Your lipid or cholesterol levels are high.  You are older than 65 years of age.  You are at high risk for heart disease. What should I know about cancer screening? Depending on your health history and family history, you may need to have cancer screening at various ages. This may include screening for:  Breast cancer.  Cervical cancer.  Colorectal cancer.  Skin cancer.  Lung cancer. What should I know about heart disease, diabetes, and high blood  pressure? Blood pressure and heart disease  High blood pressure causes heart disease and increases the risk of stroke. This is more likely to develop in people who have high blood pressure readings, are of African descent, or are overweight.  Have your blood pressure checked: ? Every 3-5 years if you are 18-39 years of age. ? Every year if you are 40 years old or older. Diabetes Have regular diabetes screenings. This checks your fasting blood sugar level. Have the screening done:  Once every three years after age 40 if you are at a normal weight and have a low risk for diabetes.  More often and at a younger age if you are overweight or have a high risk for diabetes. What should I know about preventing infection? Hepatitis B If you have a higher risk for hepatitis B, you should be screened for this virus. Talk with your health care provider to find out if you are at risk for hepatitis B infection. Hepatitis C Testing is recommended for:  Everyone born from 1945 through 1965.  Anyone with known risk factors for hepatitis C. Sexually transmitted infections (STIs)  Get screened for STIs, including gonorrhea and chlamydia, if: ? You are sexually active and are younger than 65 years of age. ? You are older than 65 years of age and your health care provider tells you that you are at risk for this type of infection. ? Your sexual activity has changed since you were last screened, and you are at increased risk for chlamydia or gonorrhea. Ask your health care provider if   you are at risk.  Ask your health care provider about whether you are at high risk for HIV. Your health care provider may recommend a prescription medicine to help prevent HIV infection. If you choose to take medicine to prevent HIV, you should first get tested for HIV. You should then be tested every 3 months for as long as you are taking the medicine. Pregnancy  If you are about to stop having your period (premenopausal) and  you may become pregnant, seek counseling before you get pregnant.  Take 400 to 800 micrograms (mcg) of folic acid every day if you become pregnant.  Ask for birth control (contraception) if you want to prevent pregnancy. Osteoporosis and menopause Osteoporosis is a disease in which the bones lose minerals and strength with aging. This can result in bone fractures. If you are 65 years old or older, or if you are at risk for osteoporosis and fractures, ask your health care provider if you should:  Be screened for bone loss.  Take a calcium or vitamin D supplement to lower your risk of fractures.  Be given hormone replacement therapy (HRT) to treat symptoms of menopause. Follow these instructions at home: Lifestyle  Do not use any products that contain nicotine or tobacco, such as cigarettes, e-cigarettes, and chewing tobacco. If you need help quitting, ask your health care provider.  Do not use street drugs.  Do not share needles.  Ask your health care provider for help if you need support or information about quitting drugs. Alcohol use  Do not drink alcohol if: ? Your health care provider tells you not to drink. ? You are pregnant, may be pregnant, or are planning to become pregnant.  If you drink alcohol: ? Limit how much you use to 0-1 drink a day. ? Limit intake if you are breastfeeding.  Be aware of how much alcohol is in your drink. In the [place] one drink equals one 12 oz bottle of beer (355 mL), one 5 oz glass of wine (148 mL), or one 1 oz glass of hard liquor (44 mL). General instructions  Schedule regular health, dental, and eye exams.  Stay current with your vaccines.  Tell your health care provider if: ? You often feel depressed. ? You have ever been abused or do not feel safe at home. Summary  Adopting a healthy lifestyle and getting preventive care are important in promoting health and wellness.  Follow your health care provider's instructions about healthy  diet, exercising, and getting tested or screened for diseases.  Follow your health care provider's instructions on monitoring your cholesterol and blood pressure. This information is not intended to replace advice given to you by your health care provider. Make sure you discuss any questions you have with your health care provider. Document Revised: 09/26/2018 Document Reviewed: 09/26/2018 Elsevier Patient Education  2020 [employer]  

## 2020-08-11 LAB — HEPATITIS B SURFACE ANTIBODY, QUANTITATIVE: Hep B S AB Quant (Post): <5 m[IU]/mL — ABNORMAL LOW (ref >=10)

## 2020-08-11 LAB — HEPATITIS A ANTIBODY, TOTAL: Hepatitis A AB,Total: NONREACTIVE

## 2020-08-11 LAB — HEPATITIS B SURFACE ANTIGEN: Hepatitis B Surface Ag: NONREACTIVE

## 2020-08-13 ENCOUNTER — Encounter: Payer: Self-pay | Admitting: Internal Medicine

## 2020-08-13 DIAGNOSIS — I2510 Atherosclerotic heart disease of native coronary artery without angina pectoris: Secondary | ICD-10-CM | POA: Insufficient documentation

## 2020-08-13 DIAGNOSIS — I700 Atherosclerosis of aorta: Secondary | ICD-10-CM | POA: Insufficient documentation

## 2020-08-13 MED ORDER — ATORVASTATIN CALCIUM 20 MG PO TABS: 20.0000 mg | ORAL_TABLET | Freq: Every day | ORAL | 1 refills | Status: DC

## 2020-08-13 NOTE — Assessment & Plan Note (Signed)
Will address risk factor modifications.

## 2020-08-18 ENCOUNTER — Telehealth (HOSPITAL_COMMUNITY): Payer: Self-pay | Admitting: *Deleted

## 2020-08-18 ENCOUNTER — Encounter (HOSPITAL_COMMUNITY): Payer: Self-pay | Admitting: *Deleted

## 2020-08-18 NOTE — Telephone Encounter (Signed)
Left message on voicemail per DPR in reference to upcoming appointment scheduled on 08/19/2020 at 0730 with detailed instructions given per Myocardial Perfusion Study Information Sheet for the test. LM to arrive 15 minutes early, and that it is imperative to arrive on time for appointment to keep from having the test rescheduled. If you need to cancel or reschedule your appointment, please call the office within 24 hours of your appointment. Failure to do so may result in a cancellation of your appointment, and a $50 no show fee. Phone number given for call back for any questions. Mychart letter sent with instructions.Ilhan Madan, Ranae Palms

## 2020-08-19 ENCOUNTER — Ambulatory Visit (HOSPITAL_COMMUNITY): Payer: Medicare Other | Attending: Internal Medicine

## 2020-08-19 ENCOUNTER — Other Ambulatory Visit: Payer: Self-pay

## 2020-08-19 DIAGNOSIS — I2584 Coronary atherosclerosis due to calcified coronary lesion: Secondary | ICD-10-CM | POA: Diagnosis not present

## 2020-08-19 DIAGNOSIS — I2510 Atherosclerotic heart disease of native coronary artery without angina pectoris: Secondary | ICD-10-CM | POA: Diagnosis not present

## 2020-08-19 DIAGNOSIS — R0600 Dyspnea, unspecified: Secondary | ICD-10-CM | POA: Insufficient documentation

## 2020-08-19 DIAGNOSIS — R0609 Other forms of dyspnea: Secondary | ICD-10-CM

## 2020-08-19 LAB — MYOCARDIAL PERFUSION IMAGING
LV dias vol: 40 mL (ref 46–106)
LV sys vol: 13 mL
Peak HR: 116 {beats}/min
Rest HR: 80 {beats}/min
SDS: 2
SRS: 0
SSS: 2
TID: 0.96

## 2020-08-19 MED ORDER — TECHNETIUM TC 99M TETROFOSMIN IV KIT
10.4000 | PACK | Freq: Once | INTRAVENOUS | Status: AC | PRN
  Administered 2020-08-19: 10.4 via INTRAVENOUS
  Filled 2020-08-19: qty 11

## 2020-08-19 MED ORDER — REGADENOSON 0.4 MG/5ML IV SOLN
0.4000 mg | Freq: Once | INTRAVENOUS | Status: AC
  Administered 2020-08-19: 0.4 mg via INTRAVENOUS

## 2020-08-19 MED ORDER — TECHNETIUM TC 99M TETROFOSMIN IV KIT
30.6000 | PACK | Freq: Once | INTRAVENOUS | Status: AC | PRN
  Administered 2020-08-19: 30.6 via INTRAVENOUS
  Filled 2020-08-19: qty 31

## 2020-09-07 ENCOUNTER — Ambulatory Visit: Payer: BLUE CROSS/BLUE SHIELD | Admitting: Nurse Practitioner

## 2020-09-07 ENCOUNTER — Other Ambulatory Visit: Payer: Self-pay

## 2020-09-07 ENCOUNTER — Ambulatory Visit (INDEPENDENT_AMBULATORY_CARE_PROVIDER_SITE_OTHER): Payer: Medicare Other | Admitting: Internal Medicine

## 2020-09-07 ENCOUNTER — Encounter: Payer: Self-pay | Admitting: Internal Medicine

## 2020-09-07 ENCOUNTER — Ambulatory Visit (INDEPENDENT_AMBULATORY_CARE_PROVIDER_SITE_OTHER): Payer: Medicare Other

## 2020-09-07 VITALS — BP 120/82 | HR 88 | Temp 98.2°F | Resp 16 | Ht 62.0 in | Wt 152.0 lb

## 2020-09-07 DIAGNOSIS — R0600 Dyspnea, unspecified: Secondary | ICD-10-CM | POA: Diagnosis not present

## 2020-09-07 DIAGNOSIS — J189 Pneumonia, unspecified organism: Secondary | ICD-10-CM

## 2020-09-07 DIAGNOSIS — R918 Other nonspecific abnormal finding of lung field: Secondary | ICD-10-CM | POA: Diagnosis not present

## 2020-09-07 MED ORDER — MOXIFLOXACIN HCL 400 MG PO TABS: 400.0000 mg | ORAL_TABLET | Freq: Every day | ORAL | 0 refills | Status: AC

## 2020-09-07 NOTE — Progress Notes (Signed)
Subjective:  Patient ID: Lynn Kline, female    DOB: [birth year]  Age: 64 y.o. MRN: [id number]  CC: Cough  This visit occurred during the SARS-CoV-2 public health emergency.  Safety protocols were in place, including screening questions prior to the visit, additional usage of staff PPE, and extensive cleaning of exam room while observing appropriate contact time as indicated for disinfecting solutions.    HPI Lynn Kline presents for f/up - She continues to feel poorly with cough that is productive of white/ yellow phlegm.  She complains of shortness of breath, DOE, and a dull ache in her chest.  She tells me that Trelegy helps some.  She recently completed a course of omadacycline for pneumonia.  Outpatient Medications Prior to Visit  Medication Sig Dispense Refill  . albuterol (PROVENTIL) (2.5 MG/3ML) 0.083% nebulizer solution Take 3 mLs (2.5 mg total) by nebulization every 4 (four) hours as needed for wheezing or shortness of breath. 75 mL 5  . ALPRAZolam (XANAX) 0.25 MG tablet Take 1 tablet (0.25 mg total) by mouth 2 (two) times daily as needed for anxiety. 60 tablet 1  . atorvastatin (LIPITOR) 20 MG tablet Take 1 tablet (20 mg total) by mouth daily. 90 tablet 1  . calcium-vitamin D 250-100 MG-UNIT tablet Take 1 tablet by mouth daily. 30 tablet 5  . Cholecalciferol (VITAMIN D) 50 MCG (2000 UT) CAPS Take 1 capsule (2,000 Units total) by mouth daily. 30 capsule 5  . dextromethorphan (DELSYM) 30 MG/5ML liquid Take 5 mLs (30 mg total) by mouth 2 (two) times daily. 148 mL 1  . fluocinonide-emollient (LIDEX-E) 0.05 % cream Apply 1 application topically 2 (two) times daily. 60 g 1  . TRELEGY ELLIPTA 100-62.5-25 MCG/INH AEPB TAKE 1 PUFF BY MOUTH EVERY DAY 60 each 2  . VENTOLIN HFA 108 (90 Base) MCG/ACT inhaler INHALE 1-2 PUFFS INTO THE LUNGS EVERY 6 (SIX) HOURS AS NEEDED FOR WHEEZING OR SHORTNESS OF BREATH. 18 g 3  . doxycycline (VIBRA-TABS) 100 MG tablet Take 1 tablet (100 mg total) by  mouth 2 (two) times daily. 14 tablet 0   No facility-administered medications prior to visit.    ROS Review of Systems  Constitutional: Negative for chills, fatigue and fever.  HENT: Negative.   Eyes: Negative for visual disturbance.  Respiratory: Positive for cough, shortness of breath and wheezing. Negative for chest tightness and stridor.   Cardiovascular: Positive for chest pain. Negative for palpitations and leg swelling.  Gastrointestinal: Negative for abdominal pain, constipation, diarrhea, nausea and vomiting.  Endocrine: Negative.   Genitourinary: Negative.  Negative for difficulty urinating and dysuria.  Musculoskeletal: Negative.  Negative for arthralgias and myalgias.  Skin: Negative.  Negative for color change and pallor.  Neurological: Negative.  Negative for dizziness, weakness, light-headedness and headaches.  Hematological: Negative for adenopathy. Does not bruise/bleed easily.  Psychiatric/Behavioral: Negative.     Objective:  BP 120/82   Pulse 88   Temp 98.2 F (36.8 C) (Oral)   Resp 16   Ht 5\' 2"  (1.575 m)   Wt 152 lb (68.9 kg)   SpO2 94%   BMI 27.80 kg/m   BP Readings from Last 3 Encounters:  09/07/20 120/82  08/10/20 116/70  02/26/20 128/82    Wt Readings from Last 3 Encounters:  09/07/20 152 lb (68.9 kg)  08/19/20 151 lb (68.5 kg)  08/10/20 151 lb (68.5 kg)    Physical Exam Vitals reviewed.  Constitutional:      General: She is not in  acute distress.    Appearance: She is not ill-appearing, toxic-appearing or diaphoretic.  HENT:     Nose: Nose normal.     Mouth/Throat:     Mouth: Mucous membranes are moist.  Eyes:     General: No scleral icterus.    Conjunctiva/sclera: Conjunctivae normal.  Cardiovascular:     Rate and Rhythm: Normal rate and regular rhythm.     Heart sounds: No murmur heard.   Pulmonary:     Effort: No tachypnea, accessory muscle usage or respiratory distress.     Breath sounds: No stridor or decreased air  movement. Examination of the right-upper field reveals wheezing and rhonchi. Examination of the left-upper field reveals wheezing and rhonchi. Examination of the right-middle field reveals wheezing and rhonchi. Examination of the left-middle field reveals wheezing and rhonchi. Examination of the right-lower field reveals wheezing and rhonchi. Examination of the left-lower field reveals wheezing and rhonchi. Wheezing and rhonchi present. No decreased breath sounds or rales.     Comments: Diffuse, bilateral, late expiratory wheezes and rhonchi.  Good air movement. Abdominal:     General: Abdomen is flat.     Palpations: There is no mass.     Tenderness: There is no abdominal tenderness. There is no guarding.  Musculoskeletal:        General: Normal range of motion.     Cervical back: Neck supple.     Right lower leg: No edema.     Left lower leg: No edema.  Lymphadenopathy:     Cervical: No cervical adenopathy.  Skin:    General: Skin is warm and dry.  Neurological:     General: No focal deficit present.  Psychiatric:        Mood and Affect: Mood normal.        Behavior: Behavior normal.     Lab Results  Component Value Date   WBC 7.2 08/10/2020   HGB 13.4 08/10/2020   HCT 39.8 08/10/2020   PLT 373.0 08/10/2020   GLUCOSE 100 (H) 08/10/2020   CHOL 188 08/10/2020   TRIG 86.0 08/10/2020   HDL 51.70 08/10/2020   LDLDIRECT 141.0 11/15/2011   LDLCALC 119 (H) 08/10/2020   ALT 9 08/10/2020   AST 13 08/10/2020   NA 137 08/10/2020   K 3.6 08/10/2020   CL 103 08/10/2020   CREATININE 0.73 08/10/2020   BUN 15 08/10/2020   CO2 29 08/10/2020   TSH 1.22 08/10/2020   INR 1.0 08/10/2020   HGBA1C 5.8 08/19/2014    CT CHEST LUNG CA SCREEN LOW DOSE W/O CM  Result Date: 07/31/2019 CLINICAL DATA:  64 year old female current smoker with 50 pack-year history of smoking. Lung cancer screening examination. EXAM: CT CHEST WITHOUT CONTRAST LOW-DOSE FOR LUNG CANCER SCREENING TECHNIQUE:  Multidetector CT imaging of the chest was performed following the standard protocol without IV contrast. COMPARISON:  Chest CT 07/24/2018. FINDINGS: Cardiovascular: Heart size is normal. There is no significant pericardial fluid, thickening or pericardial calcification. There is aortic atherosclerosis, as well as atherosclerosis of the great vessels of the mediastinum and the coronary arteries, including calcified atherosclerotic plaque in the right coronary artery. Mediastinum/Nodes: No pathologically enlarged mediastinal or hilar lymph nodes. Please note that accurate exclusion of hilar adenopathy is limited on noncontrast CT scans. Esophagus is unremarkable in appearance. No axillary lymphadenopathy. Lungs/Pleura: Multiple small pulmonary nodules are noted throughout both lungs, largest of which is in the left upper lobe (axial image 68 of series 3), with a volume derived mean diameter  of 3.4 mm. No larger more suspicious appearing pulmonary nodules or masses are noted. No acute consolidative airspace disease. No pleural effusions. Mild diffuse bronchial wall thickening with mild to moderate centrilobular and paraseptal emphysema. Upper Abdomen: Aortic atherosclerosis.  Status post cholecystectomy. Musculoskeletal: Chronic compression fracture of T7 with 50% loss of anterior vertebral body height. There are no aggressive appearing lytic or blastic lesions noted in the visualized portions of the skeleton. IMPRESSION: 1. Lung-RADS 2S, benign appearance or behavior. Continue annual screening with low-dose chest CT without contrast in 12 months. 2. The "S" modifier above refers to potentially clinically significant non lung cancer related findings. Specifically, there is aortic atherosclerosis, in addition to right coronary artery disease. Please note that although the presence of coronary artery calcium documents the presence of coronary artery disease, the severity of this disease and any potential stenosis cannot  be assessed on this non-gated CT examination. Assessment for potential risk factor modification, dietary therapy or pharmacologic therapy may be warranted, if clinically indicated. 3. Mild diffuse bronchial wall thickening with mild centrilobular and paraseptal emphysema; imaging findings suggestive of underlying COPD. Aortic Atherosclerosis (ICD10-I70.0) and Emphysema (ICD10-J43.9). Electronically Signed   By: Vinnie Langton M.D.   On: 07/31/2019 13:45   FINDINGS: Pulmonary hyperinflation compatible with COPD. Patchy airspace density in the left upper lobe appears new and not present on CT of 07/31/2019. Remainder the lungs are clear. No heart failure or effusion. Cardiac and mediastinal contours normal. Chronic fracture midthoracic spine unchanged.  IMPRESSION: COPD. Small patchy airspace density left upper lobe is new and could represent pneumonia or mass lesion. Followup PA and lateral chest X-ray is recommended in 3-4 weeks following trial of antibiotic therapy to ensure resolution and exclude underlying malignancy.   Electronically Signed   By: Franchot Gallo M.D.   On: 08/10/2020 14:28  No results found.  Assessment & Plan:   Sharry was seen today for cough.  Diagnoses and all orders for this visit:  Pneumonia of left upper lobe due to infectious organism- Based on her symptoms, exam, and chest x-ray the pneumonia has not resolved.  She is allergic to ampicillin so will try a course of moxifloxacin.  I have also asked her to undergo a CT scan to discern if this is a malignant mass, benign tumor, or pneumonia. -     DG Chest 2 View; Future -     CT Chest Wo Contrast; Future -     moxifloxacin (AVELOX) 400 MG tablet; Take 1 tablet (400 mg total) by mouth daily at 8 pm for 7 days.  Mass of upper lobe of left lung -     CT Chest Wo Contrast; Future   I have discontinued Avanthika L. Weismann's doxycycline. I am also having her start on moxifloxacin. Additionally, I am having  her maintain her Vitamin D, calcium-vitamin D, fluocinonide-emollient, albuterol, Ventolin HFA, Trelegy Ellipta, ALPRAZolam, dextromethorphan, and atorvastatin.  Meds ordered this encounter  Medications  . moxifloxacin (AVELOX) 400 MG tablet    Sig: Take 1 tablet (400 mg total) by mouth daily at 8 pm for 7 days.    Dispense:  7 tablet    Refill:  0     Follow-up: Return in about 3 weeks (around 09/28/2020).  Scarlette Calico, MD

## 2020-09-07 NOTE — Patient Instructions (Signed)
Community-Acquired Pneumonia, Adult Pneumonia is an infection of the lungs. It causes swelling in the airways of the lungs. Mucus and fluid may also build up inside the airways. One type of pneumonia can happen while a person is in a hospital. A different type can happen when a person is not in a hospital (community-acquired pneumonia).  What are the causes?  This condition is caused by germs (viruses, bacteria, or fungi). Some types of germs can be passed from one person to another. This can happen when you breathe in droplets from the cough or sneeze of an infected person. What increases the risk? You are more likely to develop this condition if you:  Have a long-term (chronic) disease, such as: ? Chronic obstructive pulmonary disease (COPD). ? Asthma. ? Cystic fibrosis. ? Congestive heart failure. ? Diabetes. ? Kidney disease.  Have HIV.  Have sickle cell disease.  Have had your spleen removed.  Do not take good care of your teeth and mouth (poor dental hygiene).  Have a medical condition that increases the risk of breathing in droplets from your own mouth and nose.  Have a weakened body defense system (immune system).  Are a smoker.  Travel to areas where the germs that cause this illness are common.  Are around certain animals or the places they live. What are the signs or symptoms?  A dry cough.  A wet (productive) cough.  Fever.  Sweating.  Chest pain. This often happens when breathing deeply or coughing.  Fast breathing or trouble breathing.  Shortness of breath.  Shaking chills.  Feeling tired (fatigue).  Muscle aches. How is this treated? Treatment for this condition depends on many things. Most adults can be treated at home. In some cases, treatment must happen in a hospital. Treatment may include:  Medicines given by mouth or through an IV tube.  Being given extra oxygen.  Respiratory therapy. In rare cases, treatment for very bad pneumonia  may include:  Using a machine to help you breathe.  Having a procedure to remove fluid from around your lungs. Follow these instructions at home: Medicines  Take over-the-counter and prescription medicines only as told by your doctor. ? Only take cough medicine if you are losing sleep.  If you were prescribed an antibiotic medicine, take it as told by your doctor. Do not stop taking the antibiotic even if you start to feel better. General instructions   Sleep with your head and neck raised (elevated). You can do this by sleeping in a recliner or by putting a few pillows under your head.  Rest as needed. Get at least 8 hours of sleep each night.  Drink enough water to keep your pee (urine) pale yellow.  Eat a healthy diet that includes plenty of vegetables, fruits, whole grains, low-fat dairy products, and lean protein.  Do not use any products that contain nicotine or tobacco. These include cigarettes, e-cigarettes, and chewing tobacco. If you need help quitting, ask your doctor.  Keep all follow-up visits as told by your doctor. This is important. How is this prevented? A shot (vaccine) can help prevent pneumonia. Shots are often suggested for:  People older than 65 years of age.  People older than 65 years of age who: ? Are having cancer treatment. ? Have long-term (chronic) lung disease. ? Have problems with their body's defense system. You may also prevent pneumonia if you take these actions:  Get the flu (influenza) shot every year.  Go to the dentist as   often as told.  Wash your hands often. If you cannot use soap and water, use hand sanitizer. Contact a doctor if:  You have a fever.  You lose sleep because your cough medicine does not help. Get help right away if:  You are short of breath and it gets worse.  You have more chest pain.  Your sickness gets worse. This is very serious if: ? You are an older adult. ? Your body's defense system is weak.  You  cough up blood. Summary  Pneumonia is an infection of the lungs.  Most adults can be treated at home. Some will need treatment in a hospital.  Drink enough water to keep your pee pale yellow.  Get at least 8 hours of sleep each night. This information is not intended to replace advice given to you by your health care provider. Make sure you discuss any questions you have with your health care provider. Document Revised: 01/23/2019 Document Reviewed: 05/31/2018 Elsevier Patient Education  2020 [employer]  

## 2020-09-24 ENCOUNTER — Other Ambulatory Visit: Payer: Medicare Other

## 2020-09-24 ENCOUNTER — Ambulatory Visit (INDEPENDENT_AMBULATORY_CARE_PROVIDER_SITE_OTHER)
Admission: RE | Admit: 2020-09-24 | Discharge: 2020-09-24 | Disposition: A | Discharge status: Home / Self Care | Payer: Medicare Other | Source: Ambulatory Visit | Attending: Acute Care | Admitting: Acute Care

## 2020-09-24 ENCOUNTER — Other Ambulatory Visit: Payer: Self-pay

## 2020-09-24 DIAGNOSIS — F17210 Nicotine dependence, cigarettes, uncomplicated: Secondary | ICD-10-CM

## 2020-09-24 DIAGNOSIS — Z87891 Personal history of nicotine dependence: Secondary | ICD-10-CM | POA: Diagnosis not present

## 2020-09-25 ENCOUNTER — Telehealth: Payer: Self-pay | Admitting: Acute Care

## 2020-09-25 NOTE — Telephone Encounter (Signed)
Received call report from [facility] with [facility] on patient's CT Lung Cancer Screen CT done on 09/24/2020. Sarah, please review the result/impression copied below:  IMPRESSION: 1. Lung-RADS 4BS, suspicious. Additional imaging evaluation or consultation with Pulmonology or Thoracic Surgery recommended. 2. The "S" modifier above refers to potentially clinically significant non lung cancer related findings. Specifically, there is aortic atherosclerosis, in addition to right coronary artery disease. Please note that although the presence of coronary artery calcium documents the presence of coronary artery disease, the severity of this disease and any potential stenosis cannot be assessed on this non-gated CT examination. Assessment for potential risk factor modification, dietary therapy or pharmacologic therapy may be warranted, if clinically indicated. 3. Mild diffuse bronchial wall thickening with mild centrilobular and paraseptal emphysema; imaging findings suggestive of underlying COPD.  Please advise, thank you.

## 2020-10-01 ENCOUNTER — Ambulatory Visit (INDEPENDENT_AMBULATORY_CARE_PROVIDER_SITE_OTHER): Payer: Medicare Other | Admitting: Internal Medicine

## 2020-10-01 ENCOUNTER — Encounter: Payer: Self-pay | Admitting: Internal Medicine

## 2020-10-01 ENCOUNTER — Ambulatory Visit: Payer: Medicare Other | Admitting: Internal Medicine

## 2020-10-01 ENCOUNTER — Other Ambulatory Visit: Payer: Self-pay

## 2020-10-01 VITALS — BP 124/76 | HR 80 | Temp 98.4°F | Resp 20 | Ht 62.0 in | Wt 149.0 lb

## 2020-10-01 DIAGNOSIS — R0902 Hypoxemia: Secondary | ICD-10-CM

## 2020-10-01 DIAGNOSIS — R918 Other nonspecific abnormal finding of lung field: Secondary | ICD-10-CM | POA: Diagnosis not present

## 2020-10-01 DIAGNOSIS — J431 Panlobular emphysema: Secondary | ICD-10-CM | POA: Diagnosis not present

## 2020-10-01 NOTE — Progress Notes (Signed)
Subjective:  Patient ID: Lynn Kline, female    DOB: March 19, 1956  Age: 64 y.o. MRN: [id number]  CC: COPD  This visit occurred during the SARS-CoV-2 public health emergency.  Safety protocols were in place, including screening questions prior to the visit, additional usage of staff PPE, and extensive cleaning of exam room while observing appropriate contact time as indicated for disinfecting solutions.    HPI Lynn Kline presents for f/up - She recently underwent a CT scan and has an enlarging mass in her left upper lung that looks suspicious for carcinoma.  She tells me she is getting some symptom relief with the triple inhaler but she complains that when she is active she gets short of breath and her heart rate goes up.  She has some pleuritic chest discomfort and complains of shortness of breath and cough productive of thin white phlegm.  Outpatient Medications Prior to Visit  Medication Sig Dispense Refill  . albuterol (PROVENTIL) (2.5 MG/3ML) 0.083% nebulizer solution Take 3 mLs (2.5 mg total) by nebulization every 4 (four) hours as needed for wheezing or shortness of breath. 75 mL 5  . ALPRAZolam (XANAX) 0.25 MG tablet Take 1 tablet (0.25 mg total) by mouth 2 (two) times daily as needed for anxiety. 60 tablet 1  . calcium-vitamin D 250-100 MG-UNIT tablet Take 1 tablet by mouth daily. 30 tablet 5  . Cholecalciferol (VITAMIN D) 50 MCG (2000 UT) CAPS Take 1 capsule (2,000 Units total) by mouth daily. 30 capsule 5  . dextromethorphan (DELSYM) 30 MG/5ML liquid Take 5 mLs (30 mg total) by mouth 2 (two) times daily. 148 mL 1  . fluocinonide-emollient (LIDEX-E) 0.05 % cream Apply 1 application topically 2 (two) times daily. 60 g 1  . TRELEGY ELLIPTA 100-62.5-25 MCG/INH AEPB TAKE 1 PUFF BY MOUTH EVERY DAY 60 each 2  . VENTOLIN HFA 108 (90 Base) MCG/ACT inhaler INHALE 1-2 PUFFS INTO THE LUNGS EVERY 6 (SIX) HOURS AS NEEDED FOR WHEEZING OR SHORTNESS OF BREATH. 18 g 3  . atorvastatin (LIPITOR)  20 MG tablet Take 1 tablet (20 mg total) by mouth daily. 90 tablet 1   No facility-administered medications prior to visit.    ROS Review of Systems  Constitutional: Negative for chills, diaphoresis, fatigue and fever.  HENT: Negative.   Eyes: Negative.   Respiratory: Positive for cough and shortness of breath. Negative for chest tightness and wheezing.   Cardiovascular: Positive for chest pain. Negative for palpitations and leg swelling.  Gastrointestinal: Negative for abdominal pain, diarrhea and nausea.  Genitourinary: Negative.   Musculoskeletal: Positive for back pain. Negative for arthralgias and myalgias.  Skin: Negative.   Neurological: Negative.  Negative for dizziness, weakness and light-headedness.  Hematological: Negative for adenopathy. Does not bruise/bleed easily.  Psychiatric/Behavioral: Negative.     Objective:  BP 124/76   Pulse 80   Temp 98.4 F (36.9 C) (Oral)   Resp 20   Ht 5\' 2"  (1.575 m)   Wt 149 lb (67.6 kg)   SpO2 93%   BMI 27.25 kg/m   BP Readings from Last 3 Encounters:  10/01/20 124/76  09/07/20 120/82  08/10/20 116/70    Wt Readings from Last 3 Encounters:  10/01/20 149 lb (67.6 kg)  09/07/20 152 lb (68.9 kg)  08/19/20 151 lb (68.5 kg)    Physical Exam Vitals reviewed.  HENT:     Nose: Nose normal.     Mouth/Throat:     Mouth: Mucous membranes are moist.  Eyes:  General: No scleral icterus.    Pupils: Pupils are equal, round, and reactive to light.  Cardiovascular:     Rate and Rhythm: Normal rate and regular rhythm.     Heart sounds: No murmur heard.   Pulmonary:     Effort: Pulmonary effort is normal.     Breath sounds: No stridor. No wheezing, rhonchi or rales.  Abdominal:     General: Abdomen is flat.     Palpations: There is no mass.     Tenderness: There is no abdominal tenderness.  Musculoskeletal:        General: Normal range of motion.     Cervical back: Neck supple.     Right lower leg: No edema.     Left  lower leg: No edema.  Lymphadenopathy:     Cervical: No cervical adenopathy.  Skin:    General: Skin is warm and dry.  Neurological:     General: No focal deficit present.     Mental Status: She is alert.  Psychiatric:        Mood and Affect: Mood normal.        Behavior: Behavior normal.     Lab Results  Component Value Date   WBC 7.2 08/10/2020   HGB 13.4 08/10/2020   HCT 39.8 08/10/2020   PLT 373.0 08/10/2020   GLUCOSE 100 (H) 08/10/2020   CHOL 188 08/10/2020   TRIG 86.0 08/10/2020   HDL 51.70 08/10/2020   LDLDIRECT 141.0 11/15/2011   LDLCALC 119 (H) 08/10/2020   ALT 9 08/10/2020   AST 13 08/10/2020   NA 137 08/10/2020   K 3.6 08/10/2020   CL 103 08/10/2020   CREATININE 0.73 08/10/2020   BUN 15 08/10/2020   CO2 29 08/10/2020   TSH 1.22 08/10/2020   INR 1.0 08/10/2020   HGBA1C 5.8 08/19/2014    CT CHEST LUNG CA SCREEN LOW DOSE W/O CM  Result Date: 09/25/2020 CLINICAL DATA:  64 year old female current smoker with 51 pack-year history of smoking. Lung cancer screening examination. EXAM: CT CHEST WITHOUT CONTRAST LOW-DOSE FOR LUNG CANCER SCREENING TECHNIQUE: Multidetector CT imaging of the chest was performed following the standard protocol without IV contrast. COMPARISON:  Low-dose lung cancer screening chest CT 07/31/2019. FINDINGS: Cardiovascular: Heart size is normal. There is no significant pericardial fluid, thickening or pericardial calcification. There is aortic atherosclerosis, as well as atherosclerosis of the great vessels of the mediastinum and the coronary arteries, including calcified atherosclerotic plaque in the right coronary artery. Mediastinum/Nodes: No pathologically enlarged mediastinal or hilar lymph nodes. Please note that accurate exclusion of hilar adenopathy is limited on noncontrast CT scans. Esophagus is unremarkable in appearance. No axillary lymphadenopathy. Lungs/Pleura: Multiple small pulmonary nodules are again noted, generally similar to the  prior examination. The exception is a left upper lobe nodule near the apex (axial image 53 of series 3) which has significantly enlarged, currently with a volume derived mean diameter of 18.6 mm, with highly irregular spiculated margins. No pleural effusions. Diffuse bronchial wall thickening with mild centrilobular and paraseptal emphysema. Upper Abdomen: Aortic atherosclerosis. Musculoskeletal: There are no aggressive appearing lytic or blastic lesions noted in the visualized portions of the skeleton. Chronic compression fracture of T7 with 50% loss of anterior vertebral body height. IMPRESSION: 1. Lung-RADS 4BS, suspicious. Additional imaging evaluation or consultation with Pulmonology or Thoracic Surgery recommended. 2. The "S" modifier above refers to potentially clinically significant non lung cancer related findings. Specifically, there is aortic atherosclerosis, in addition to right coronary artery  disease. Please note that although the presence of coronary artery calcium documents the presence of coronary artery disease, the severity of this disease and any potential stenosis cannot be assessed on this non-gated CT examination. Assessment for potential risk factor modification, dietary therapy or pharmacologic therapy may be warranted, if clinically indicated. 3. Mild diffuse bronchial wall thickening with mild centrilobular and paraseptal emphysema; imaging findings suggestive of underlying COPD. These results will be called to the ordering clinician or representative by the Radiologist Assistant, and communication documented in the PACS or [employer]. Aortic Atherosclerosis (ICD10-I70.0) and Emphysema (ICD10-J43.9). Electronically Signed   By: Vinnie Langton M.D.   On: 09/25/2020 12:31    Assessment & Plan:   Aubri was seen today for copd.  Diagnoses and all orders for this visit:  Panlobular emphysema ([place])- Will continue the triple inhaler with Trelegy. -     Ambulatory referral to  Home Health  Hypoxemia- I have asked her to consider using continuous oxygen. -     Ambulatory referral to [place] have discontinued Zsofia L. Salmi's atorvastatin. I am also having her maintain her Vitamin D, calcium-vitamin D, fluocinonide-emollient, albuterol, Ventolin HFA, Trelegy Ellipta, ALPRAZolam, and dextromethorphan.  No orders of the defined types were placed in this encounter.    Follow-up: No follow-ups on file.  Scarlette Calico, MD

## 2020-10-02 ENCOUNTER — Encounter: Payer: Self-pay | Admitting: Pulmonary Disease

## 2020-10-02 ENCOUNTER — Ambulatory Visit: Payer: Medicare Other | Admitting: Pulmonary Disease

## 2020-10-02 ENCOUNTER — Encounter: Payer: Self-pay | Admitting: Internal Medicine

## 2020-10-02 VITALS — BP 116/70 | HR 101 | Temp 98.2°F | Ht 62.0 in | Wt 146.0 lb

## 2020-10-02 DIAGNOSIS — J449 Chronic obstructive pulmonary disease, unspecified: Secondary | ICD-10-CM | POA: Diagnosis not present

## 2020-10-02 DIAGNOSIS — R911 Solitary pulmonary nodule: Secondary | ICD-10-CM | POA: Diagnosis not present

## 2020-10-02 DIAGNOSIS — J9621 Acute and chronic respiratory failure with hypoxia: Secondary | ICD-10-CM

## 2020-10-02 DIAGNOSIS — R918 Other nonspecific abnormal finding of lung field: Secondary | ICD-10-CM | POA: Diagnosis not present

## 2020-10-02 NOTE — Assessment & Plan Note (Signed)
She has an appointment with a pulmonologist 1 day after this visit.  It sounds like she will eventually undergo a biopsy of the mass.

## 2020-10-02 NOTE — Progress Notes (Signed)
Synopsis: Referred in November 2019 for emphysema by Lynn Lima, MD  Subjective:   PATIENT ID: Lynn Kline GENDER: female DOB: [birth year], MRN: [id number]  Chief Complaint  Patient presents with  . Follow-up    Here to review the CT scan from 12/9    PMH of tobacco abuse, smoked for 45 years, she recently had a CT chest with upper lobe emphysema. She recently found out that her 3 family members on her fathers side died of pulmonary fibrosis. Father with two brothers that died of lung cancer. Father died of melanoma. She is a current smoker, 5 cigs per day. She is really trying to stop. Never had any PFTs before. She gets really short of breath when she walks. If she takes her dog for a walk she will have to stop at the end of street and rest. She has chest tightness and usually can only due a few things for a few minutes and then she has to stop and sit and rest. She makes eye glasses for a living. She lives off her ventolin inhaler.  She has been using her albuterol inhaler 8-12 times per day.  She usually goes through 3-4 albuterol inhalers per month.  OV 10/24/2018: Patient has been doing well since her last visit.  She has been smoke-free for the past 2 days.  She had PFTs completed in today's office visit that revealed an FEV1 postbronchodilator of 1.08 L, 47% predicted an RV of 190%, DLCO 73%.  Patient still describes significant dyspnea on exertion.  She is having trouble getting in from her car to the house.  She does have daily cough and sputum production which is a little bit better.  She does think that the Trelegy has made a big difference with using it each day.  Overall she is a little bit anxious about the new diagnosis of COPD.  Patient denies chest pain or hemoptysis.  OV 02/26/2020: Patient here today for follow-up.  Last seen in our office by Lynn Kline in April 2020 followed by June 2020 and again in October 2020.  Last office visit by Lynn Kline in April 2021 telephone  visit.  Per this visit she had quit smoking in March 2021.  Managed for moderate COPD last saw me in January 2020.  She was treated for exacerbation at that time congratulated on quitting smoking.  Treated with prednisone and doxycycline.  Continued recommendations for Trelegy inhaler.  Here today for follow-up after recent exacerbation.  Again today feels short of breath.  She has had significant issues with exertion.  Unfortunately she has occasionally went back to smoke a few times.  But has not smoked in the past week or more.  She is still using her Trelegy but has occasionally not use this to see if she can get by without having an inhaler.  We talked about portance of staying on regular maintenance inhaler.  OV 10/02/2020: This is a 64 year old female here today for follow-up of COPD as well as a recent lung cancer screening CT. Lung cancer screening CT was completed on 09/25/2020. This found a new left upper lobe nodule that was not present previously. The largest diameter of this kind of irregular shaped lesion is 18.6 mm. There is a more central solid component of the lesion that is approximately 1 cm with irregular margins. Overall concerning for potential underlying malignancy. Also has association with severe centrilobular and paraseptal emphysema. From a respiratory standpoint she is still very dyspneic  on exertion. Since we have last seen each other she has had three exacerbations requiring steroids and antibiotics. During that time though she had resorted back to smoking again. However the past couple weeks she states that she has quit smoking again because she "just cannot" do it anymore.   Past Medical History:  Diagnosis Date  . Anxiety   . Atherosclerosis   . Colon polyp, hyperplastic   . COPD (chronic obstructive pulmonary disease) ([place])   . Diverticulosis   . Gallstone      Family History  Problem Relation Age of Onset  . Arthritis Mother   . COPD Mother   . Emphysema Mother    . Stroke Father   . Heart disease Father   . Cancer Father        MELANOMA  . Breast cancer Maternal Grandmother   . Colon cancer Neg Hx      Past Surgical History:  Procedure Laterality Date  . ABDOMINAL HYSTERECTOMY      Social History   Socioeconomic History  . Marital status: Divorced    Spouse name: Not on file  . Number of children: Not on file  . Years of education: Not on file  . Highest education level: Not on file  Occupational History  . Not on file  Tobacco Use  . Smoking status: Light Tobacco Smoker    Packs/day: 1.00    Years: 50.00    Pack years: 50.00    Types: Cigarettes    Last attempt to quit: 12/16/2019    Years since quitting: 0.7  . Smokeless tobacco: Never Used  Substance and Sexual Activity  . Alcohol use: No    Alcohol/week: 0.0 standard drinks  . Drug use: No  . Sexual activity: Never    Comment: 1ST INTERCOURSE- 57, PARTNERS- 4  Other Topics Concern  . Not on file  Social History Narrative  . Not on file   Social Determinants of Health   Financial Resource Strain: Not on file  Food Insecurity: Not on file  Transportation Needs: Not on file  Physical Activity: Not on file  Stress: Not on file  Social Connections: Not on file  Intimate Partner Violence: Not on file     Allergies  Allergen Reactions  . Codeine Nausea And Vomiting  . Erythromycin Nausea And Vomiting  . Ampicillin Nausea And Vomiting, Rash and Other (See Comments)    Pt states that rash was on her tongue.  Has patient had a PCN reaction causing immediate rash, facial/tongue/throat swelling, SOB or lightheadedness with hypotension yes Has patient had a PCN reaction causing severe rash involving mucus membranes or skin necrosis: no Has patient had a PCN reaction that required hospitalization yes Has patient had a PCN reaction occurring within the last 10 years: no If all of the above answers are "NO", then may proceed with Cephalosporin use.      Outpatient  Medications Prior to Visit  Medication Sig Dispense Refill  . albuterol (PROVENTIL) (2.5 MG/3ML) 0.083% nebulizer solution Take 3 mLs (2.5 mg total) by nebulization every 4 (four) hours as needed for wheezing or shortness of breath. 75 mL 5  . ALPRAZolam (XANAX) 0.25 MG tablet Take 1 tablet (0.25 mg total) by mouth 2 (two) times daily as needed for anxiety. 60 tablet 1  . calcium-vitamin D 250-100 MG-UNIT tablet Take 1 tablet by mouth daily. 30 tablet 5  . Cholecalciferol (VITAMIN D) 50 MCG (2000 UT) CAPS Take 1 capsule (2,000 Units total) by  mouth daily. 30 capsule 5  . dextromethorphan (DELSYM) 30 MG/5ML liquid Take 5 mLs (30 mg total) by mouth 2 (two) times daily. 148 mL 1  . fluocinonide-emollient (LIDEX-E) 0.05 % cream Apply 1 application topically 2 (two) times daily. 60 g 1  . TRELEGY ELLIPTA 100-62.5-25 MCG/INH AEPB TAKE 1 PUFF BY MOUTH EVERY DAY 60 each 2  . VENTOLIN HFA 108 (90 Base) MCG/ACT inhaler INHALE 1-2 PUFFS INTO THE LUNGS EVERY 6 (SIX) HOURS AS NEEDED FOR WHEEZING OR SHORTNESS OF BREATH. 18 g 3   No facility-administered medications prior to visit.    Review of Systems  Constitutional: Negative for chills, fever, malaise/fatigue and weight loss.  HENT: Negative for hearing loss, sore throat and tinnitus.   Eyes: Negative for blurred vision and double vision.  Respiratory: Positive for cough, sputum production, shortness of breath and wheezing. Negative for hemoptysis and stridor.   Cardiovascular: Negative for chest pain, palpitations, orthopnea, leg swelling and PND.  Gastrointestinal: Negative for abdominal pain, constipation, diarrhea, heartburn, nausea and vomiting.  Genitourinary: Negative for dysuria, hematuria and urgency.  Musculoskeletal: Negative for joint pain and myalgias.  Skin: Negative for itching and rash.  Neurological: Negative for dizziness, tingling, weakness and headaches.  Endo/Heme/Allergies: Negative for environmental allergies. Does not  bruise/bleed easily.  Psychiatric/Behavioral: Negative for depression. The patient is not nervous/anxious and does not have insomnia.   All other systems reviewed and are negative.    Objective:  Physical Exam Vitals reviewed.  Constitutional:      General: She is not in acute distress.    Appearance: She is well-developed.  HENT:     Head: Normocephalic and atraumatic.     Mouth/Throat:     Mouth: Oropharynx is clear and moist.     Pharynx: No oropharyngeal exudate.  Eyes:     Extraocular Movements: EOM normal.     Conjunctiva/sclera: Conjunctivae normal.     Pupils: Pupils are equal, round, and reactive to light.  Neck:     Vascular: No JVD.     Trachea: No tracheal deviation.     Comments: Loss of supraclavicular fat Cardiovascular:     Rate and Rhythm: Normal rate and regular rhythm.     Pulses: Intact distal pulses.     Heart sounds: S1 normal and S2 normal.     Comments: Distant heart tones Pulmonary:     Effort: No tachypnea or accessory muscle usage.     Breath sounds: No stridor. Decreased breath sounds (throughout all lung fields) present. No wheezing, rhonchi or rales.     Comments: Increased AP chest diameter Abdominal:     General: Bowel sounds are normal. There is no distension.     Palpations: Abdomen is soft.     Tenderness: There is no abdominal tenderness.  Musculoskeletal:        General: Deformity (muscle wasting ) present. No edema.  Skin:    General: Skin is warm and dry.     Capillary Refill: Capillary refill takes less than 2 seconds.     Findings: No rash.  Neurological:     Mental Status: She is alert and oriented to person, place, and time.  Psychiatric:        Mood and Affect: Mood and affect normal.        Behavior: Behavior normal.      Vitals:   10/02/20 1411  BP: 116/70  Pulse: (!) 101  Temp: 98.2 F (36.8 C)  TempSrc: Tympanic  SpO2: 92%  Weight: 146 lb (66.2 kg)  Height: 5\' 2"  (1.575 m)   92% on RA BMI Readings from  Last 3 Encounters:  10/02/20 26.70 kg/m  10/01/20 27.25 kg/m  09/07/20 27.80 kg/m   Wt Readings from Last 3 Encounters:  10/02/20 146 lb (66.2 kg)  10/01/20 149 lb (67.6 kg)  09/07/20 152 lb (68.9 kg)     CBC    Component Value Date/Time   WBC 7.2 08/10/2020 1403   RBC 4.43 08/10/2020 1403   HGB 13.4 08/10/2020 1403   HCT 39.8 08/10/2020 1403   PLT 373.0 08/10/2020 1403   MCV 89.8 08/10/2020 1403   MCH 30.8 11/22/2016 0832   MCHC 33.8 08/10/2020 1403   RDW 13.8 08/10/2020 1403   LYMPHSABS 2.8 08/10/2020 1403   MONOABS 0.4 08/10/2020 1403   EOSABS 0.2 08/10/2020 1403   BASOSABS 0.1 08/10/2020 1403    Chest Imaging: 07/24/2018 -CT chest with evidence of mild upper lobe predominant emphysematous change..  Pulmonary Functions Testing Results: PFT Results Latest Ref Rng & Units 10/24/2018  FVC-Pre L 2.22  FVC-Predicted Pre % 74  FVC-Post L 2.15  FVC-Predicted Post % 72  Pre FEV1/FVC % % 52  Post FEV1/FCV % % 50  FEV1-Pre L 1.17  FEV1-Predicted Pre % 51  FEV1-Post L 1.08  DLCO uncorrected ml/min/mmHg 15.91  DLCO UNC% % 73  DLVA Predicted % 83  TLC L 6.19  TLC % Predicted % 130  RV % Predicted % 190    FeNO: None   Pathology: None   Echocardiogram: None   Heart Catheterization: None    Assessment & Plan:   Lung nodule - Plan: NM PET Image Initial (PI) Skull Base To Thigh  COPD, severe (HCC) - Plan: Pulse oximetry, overnight, AMB REFERRAL FOR DME  Acute on chronic respiratory failure with hypoxemia (HCC)  Abnormal CT lung screening  Discussion:  This is a 64 year old female with severe COPD, last FEV1 of 1.08 L, 47% predicted on previous PFTs. She has an abnormal lung cancer screening CT. walked the patient today in the office which revealed hypoxemia with SPO2 dropping into the 86%  Plan:  Walk patient today for O2 needs. I have a overnight pulse oximetry test complete on room air. Prescribe DME supply for 2 L nasal cannula oxygen. Continue  current inhaler regimen. We discussed the fact that due to her current lung disease I do not believe she is a good candidate for surgery if this nodule has significant PET uptake and we believe is concerning for malignancy. I think the next best step for evaluation of the nodule is a PET scan to help [place] out of 5 the lesion. If the lesion is PET avid on nuclear medicine imaging then we would likely proceed with navigational bronchoscopy and tissue diagnosis as well as fiducial placement for plans for referral to radiation oncology and SBRT.  Patient is agreeable to this plan.  We discussed the risk benefits and alternatives of proceeding with the procedure if the PET scan is positive.  These at risk include bleeding as well as pneumothorax.   Current Outpatient Medications:  .  albuterol (PROVENTIL) (2.5 MG/3ML) 0.083% nebulizer solution, Take 3 mLs (2.5 mg total) by nebulization every 4 (four) hours as needed for wheezing or shortness of breath., Disp: 75 mL, Rfl: 5 .  ALPRAZolam (XANAX) 0.25 MG tablet, Take 1 tablet (0.25 mg total) by mouth 2 (two) times daily as needed for anxiety., Disp: 60 tablet, Rfl: 1 .  calcium-vitamin D 250-100 MG-UNIT tablet, Take 1 tablet by mouth daily., Disp: 30 tablet, Rfl: 5 .  Cholecalciferol (VITAMIN D) 50 MCG (2000 UT) CAPS, Take 1 capsule (2,000 Units total) by mouth daily., Disp: 30 capsule, Rfl: 5 .  dextromethorphan (DELSYM) 30 MG/5ML liquid, Take 5 mLs (30 mg total) by mouth 2 (two) times daily., Disp: 148 mL, Rfl: 1 .  fluocinonide-emollient (LIDEX-E) 0.05 % cream, Apply 1 application topically 2 (two) times daily., Disp: 60 g, Rfl: 1 .  TRELEGY ELLIPTA 100-62.5-25 MCG/INH AEPB, TAKE 1 PUFF BY MOUTH EVERY DAY, Disp: 60 each, Rfl: 2 .  VENTOLIN HFA 108 (90 Base) MCG/ACT inhaler, INHALE 1-2 PUFFS INTO THE LUNGS EVERY 6 (SIX) HOURS AS NEEDED FOR WHEEZING OR SHORTNESS OF BREATH., Disp: 18 g, Rfl: 3  I spent 45 minutes dedicated to the care of this  patient on the date of this encounter to include pre-visit review of records, face-to-face time with the patient discussing conditions above, post visit ordering of testing, clinical documentation with the electronic health record, making appropriate referrals as documented, and communicating necessary findings to members of the patients care team.    Garner Nash, DO [facility] 10/02/2020 2:28 PM

## 2020-10-02 NOTE — Progress Notes (Signed)
These results have been called to the patient. She is scheduled to see Dr. Valeta Harms 10/02/2020 for follow up. Please fax results to PCP and let them know we have arranged for follow up with Pulmonology.Thanks so much

## 2020-10-02 NOTE — Patient Instructions (Signed)
Thank you for visiting Dr. Valeta Harms at [facility]. Today we recommend the following:  Orders Placed This Encounter  Procedures  . NM PET Image Initial (PI) Skull Base To Thigh   I will call you with the PET scan results  If we need to plan a procedure after the PET scan we will.   Return in about 8 weeks (around 11/27/2020).    Please do your part to reduce the spread of COVID-19.

## 2020-10-05 NOTE — Telephone Encounter (Signed)
See result note and pt was seen in office on 12.17.21. Will close encounter.

## 2020-10-15 ENCOUNTER — Other Ambulatory Visit: Payer: Self-pay

## 2020-10-15 ENCOUNTER — Ambulatory Visit (HOSPITAL_COMMUNITY)
Admission: RE | Admit: 2020-10-15 | Discharge: 2020-10-15 | Disposition: A | Discharge status: Home / Self Care | Payer: Medicare Other | Source: Ambulatory Visit | Attending: Pulmonary Disease | Admitting: Pulmonary Disease

## 2020-10-15 ENCOUNTER — Encounter (HOSPITAL_COMMUNITY): Admission: RE | Admit: 2020-10-15 | Payer: Medicare Other | Source: Ambulatory Visit

## 2020-10-15 DIAGNOSIS — I2510 Atherosclerotic heart disease of native coronary artery without angina pectoris: Secondary | ICD-10-CM | POA: Insufficient documentation

## 2020-10-15 DIAGNOSIS — J984 Other disorders of lung: Secondary | ICD-10-CM | POA: Insufficient documentation

## 2020-10-15 DIAGNOSIS — R911 Solitary pulmonary nodule: Secondary | ICD-10-CM

## 2020-10-15 LAB — GLUCOSE, CAPILLARY: Glucose-Capillary: 90 mg/dL (ref 70–99)

## 2020-10-15 MED ORDER — FLUDEOXYGLUCOSE F - 18 (FDG) INJECTION
7.4000 | Freq: Once | INTRAVENOUS | Status: AC | PRN
  Administered 2020-10-15: 7.4 via INTRAVENOUS

## 2020-10-19 ENCOUNTER — Telehealth: Payer: Self-pay | Admitting: Pulmonary Disease

## 2020-10-19 NOTE — Telephone Encounter (Signed)
error 

## 2020-10-20 ENCOUNTER — Telehealth: Payer: Self-pay | Admitting: Pulmonary Disease

## 2020-10-20 DIAGNOSIS — R918 Other nonspecific abnormal finding of lung field: Secondary | ICD-10-CM

## 2020-10-20 NOTE — Telephone Encounter (Signed)
Pt has been made aware of the following appointments:  Super D CT - 1/7 @ 10 AM at [facility] - they are to send disk to [facility] Endo COVID Test - 1/15 @ 12:10 PM Bron[facility]/ENB/EBUS - 1/18 @ 9:15 AM, check in by 6:45 AM

## 2020-10-20 NOTE — Telephone Encounter (Signed)
PCCM:  Orders placed.  We spoke yesterday on the phone  [facility] tentative plan for 11/03/2020.  Josephine Igo, DO [facility] 10/20/2020 11:01 AM

## 2020-10-20 NOTE — Telephone Encounter (Signed)
I see no notes about a biopsy at last office visit or in PET scan note.    Dr. Tonia Brooms please advise about biopsy- is this something we are doing for patient? Thanks!

## 2020-10-20 NOTE — Telephone Encounter (Signed)
We have no order for this

## 2020-10-23 ENCOUNTER — Other Ambulatory Visit: Payer: Self-pay

## 2020-10-23 ENCOUNTER — Ambulatory Visit (INDEPENDENT_AMBULATORY_CARE_PROVIDER_SITE_OTHER)
Admission: RE | Admit: 2020-10-23 | Discharge: 2020-10-23 | Disposition: A | Discharge status: Home / Self Care | Payer: Medicare Other | Source: Ambulatory Visit | Attending: Pulmonary Disease | Admitting: Pulmonary Disease

## 2020-10-23 DIAGNOSIS — R918 Other nonspecific abnormal finding of lung field: Secondary | ICD-10-CM | POA: Diagnosis not present

## 2020-10-23 DIAGNOSIS — I2510 Atherosclerotic heart disease of native coronary artery without angina pectoris: Secondary | ICD-10-CM | POA: Diagnosis not present

## 2020-10-23 DIAGNOSIS — I700 Atherosclerosis of aorta: Secondary | ICD-10-CM | POA: Diagnosis not present

## 2020-10-23 DIAGNOSIS — J439 Emphysema, unspecified: Secondary | ICD-10-CM | POA: Diagnosis not present

## 2020-10-23 DIAGNOSIS — J984 Other disorders of lung: Secondary | ICD-10-CM | POA: Diagnosis not present

## 2020-10-26 ENCOUNTER — Telehealth: Payer: Self-pay | Admitting: Internal Medicine

## 2020-10-26 ENCOUNTER — Other Ambulatory Visit: Payer: Self-pay | Admitting: Internal Medicine

## 2020-10-26 ENCOUNTER — Telehealth: Payer: Self-pay | Admitting: Pulmonary Disease

## 2020-10-26 DIAGNOSIS — J441 Chronic obstructive pulmonary disease with (acute) exacerbation: Secondary | ICD-10-CM

## 2020-10-26 DIAGNOSIS — R0609 Other forms of dyspnea: Secondary | ICD-10-CM

## 2020-10-26 DIAGNOSIS — F419 Anxiety disorder, unspecified: Secondary | ICD-10-CM

## 2020-10-26 DIAGNOSIS — J432 Centrilobular emphysema: Secondary | ICD-10-CM

## 2020-10-26 DIAGNOSIS — R0602 Shortness of breath: Secondary | ICD-10-CM

## 2020-10-26 DIAGNOSIS — R0600 Dyspnea, unspecified: Secondary | ICD-10-CM

## 2020-10-26 MED ORDER — ALPRAZOLAM 0.25 MG PO TABS: 0.2500 mg | ORAL_TABLET | Freq: Two times a day (BID) | ORAL | 3 refills | Status: DC | PRN

## 2020-10-26 NOTE — Telephone Encounter (Signed)
    Patient requesting refill for ALPRAZolam Lynn Kline) 0.25 MG tablet [place], [place] AT [facility]

## 2020-10-27 ENCOUNTER — Other Ambulatory Visit: Payer: Self-pay | Admitting: Internal Medicine

## 2020-10-27 DIAGNOSIS — F419 Anxiety disorder, unspecified: Secondary | ICD-10-CM

## 2020-10-27 MED ORDER — VENTOLIN HFA 108 (90 BASE) MCG/ACT IN AERS: INHALATION_SPRAY | RESPIRATORY_TRACT | 3 refills | Status: DC

## 2020-10-27 MED ORDER — ALBUTEROL SULFATE (2.5 MG/3ML) 0.083% IN NEBU: 2.5000 mg | INHALATION_SOLUTION | RESPIRATORY_TRACT | 5 refills | Status: DC | PRN

## 2020-10-27 MED ORDER — ALPRAZOLAM 0.25 MG PO TABS: 0.2500 mg | ORAL_TABLET | Freq: Two times a day (BID) | ORAL | 3 refills | Status: DC | PRN

## 2020-10-27 MED ORDER — TRELEGY ELLIPTA 100-62.5-25 MCG/INH IN AEPB
INHALATION_SPRAY | RESPIRATORY_TRACT | 6 refills | Status: DC
Start: 2020-10-27 — End: 2021-01-18

## 2020-10-27 NOTE — Telephone Encounter (Signed)
    Patient requesting refill be sent to [facility], her insurance no longer covers [facility]

## 2020-10-27 NOTE — Telephone Encounter (Signed)
Called and spoke with patient for her to verify which meds needed to be refilled and which pharmacy she wanted them sent to. She verified both. 3 Rx have been sent in. Nothing further needed at this time.

## 2020-10-30 ENCOUNTER — Encounter (HOSPITAL_COMMUNITY): Payer: Self-pay | Admitting: Pulmonary Disease

## 2020-10-30 NOTE — Progress Notes (Signed)
Spoke with pt for pre-op call. Pt denies cardiac history, HTN or Diabetes.   Covid test scheduled for tomorrow. Instructed pt about being in quarantine after having test done until she comes to the hospital on Tuesday.

## 2020-10-31 ENCOUNTER — Other Ambulatory Visit (HOSPITAL_COMMUNITY)
Admission: RE | Admit: 2020-10-31 | Discharge: 2020-10-31 | Disposition: A | Discharge status: Home / Self Care | Payer: Medicare Other | Source: Ambulatory Visit | Attending: Pulmonary Disease | Admitting: Pulmonary Disease

## 2020-10-31 DIAGNOSIS — Z01812 Encounter for preprocedural laboratory examination: Secondary | ICD-10-CM | POA: Insufficient documentation

## 2020-10-31 DIAGNOSIS — Z20822 Contact with and (suspected) exposure to covid-19: Secondary | ICD-10-CM | POA: Diagnosis not present

## 2020-10-31 LAB — SARS CORONAVIRUS 2 (TAT 6-24 HRS): SARS Coronavirus 2: NEGATIVE

## 2020-11-03 ENCOUNTER — Encounter (HOSPITAL_COMMUNITY)
Admission: RE | Disposition: A | Discharge status: Home / Self Care | Payer: Self-pay | Source: Home / Self Care | Attending: Pulmonary Disease

## 2020-11-03 ENCOUNTER — Ambulatory Visit (HOSPITAL_COMMUNITY): Payer: Medicare Other | Admitting: Anesthesiology

## 2020-11-03 ENCOUNTER — Ambulatory Visit (HOSPITAL_COMMUNITY): Payer: Medicare Other

## 2020-11-03 ENCOUNTER — Other Ambulatory Visit: Payer: Self-pay

## 2020-11-03 ENCOUNTER — Encounter (HOSPITAL_COMMUNITY): Payer: Self-pay | Admitting: Pulmonary Disease

## 2020-11-03 ENCOUNTER — Ambulatory Visit (HOSPITAL_COMMUNITY)
Admission: RE | Admit: 2020-11-03 | Discharge: 2020-11-03 | Disposition: A | Discharge status: Home / Self Care | Payer: Medicare Other | Attending: Pulmonary Disease | Admitting: Pulmonary Disease

## 2020-11-03 DIAGNOSIS — R911 Solitary pulmonary nodule: Secondary | ICD-10-CM

## 2020-11-03 DIAGNOSIS — R590 Localized enlarged lymph nodes: Secondary | ICD-10-CM | POA: Diagnosis not present

## 2020-11-03 DIAGNOSIS — Z885 Allergy status to narcotic agent status: Secondary | ICD-10-CM | POA: Diagnosis not present

## 2020-11-03 DIAGNOSIS — J439 Emphysema, unspecified: Secondary | ICD-10-CM | POA: Diagnosis not present

## 2020-11-03 DIAGNOSIS — I700 Atherosclerosis of aorta: Secondary | ICD-10-CM | POA: Diagnosis not present

## 2020-11-03 DIAGNOSIS — F17210 Nicotine dependence, cigarettes, uncomplicated: Secondary | ICD-10-CM | POA: Insufficient documentation

## 2020-11-03 DIAGNOSIS — J449 Chronic obstructive pulmonary disease, unspecified: Secondary | ICD-10-CM | POA: Diagnosis not present

## 2020-11-03 DIAGNOSIS — Z880 Allergy status to penicillin: Secondary | ICD-10-CM | POA: Insufficient documentation

## 2020-11-03 DIAGNOSIS — Z801 Family history of malignant neoplasm of trachea, bronchus and lung: Secondary | ICD-10-CM | POA: Insufficient documentation

## 2020-11-03 DIAGNOSIS — Z881 Allergy status to other antibiotic agents status: Secondary | ICD-10-CM | POA: Insufficient documentation

## 2020-11-03 DIAGNOSIS — R918 Other nonspecific abnormal finding of lung field: Secondary | ICD-10-CM | POA: Diagnosis not present

## 2020-11-03 DIAGNOSIS — R846 Abnormal cytological findings in specimens from respiratory organs and thorax: Secondary | ICD-10-CM | POA: Diagnosis not present

## 2020-11-03 DIAGNOSIS — Z9889 Other specified postprocedural states: Secondary | ICD-10-CM

## 2020-11-03 HISTORY — PX: BRONCHIAL WASHINGS: SHX5105

## 2020-11-03 HISTORY — PX: BRONCHIAL BRUSHINGS: SHX5108

## 2020-11-03 HISTORY — DX: Pneumonia, unspecified organism: J18.9

## 2020-11-03 HISTORY — PX: BRONCHIAL NEEDLE ASPIRATION BIOPSY: SHX5106

## 2020-11-03 HISTORY — PX: BRONCHIAL BIOPSY: SHX5109

## 2020-11-03 HISTORY — PX: VIDEO BRONCHOSCOPY WITH ENDOBRONCHIAL NAVIGATION: SHX6175

## 2020-11-03 HISTORY — PX: VIDEO BRONCHOSCOPY WITH ENDOBRONCHIAL ULTRASOUND: SHX6177

## 2020-11-03 HISTORY — PX: FIDUCIAL MARKER PLACEMENT: SHX6858

## 2020-11-03 SURGERY — VIDEO BRONCHOSCOPY WITH ENDOBRONCHIAL NAVIGATION
Anesthesia: General

## 2020-11-03 MED ORDER — LACTATED RINGERS IV SOLN: INTRAVENOUS | Status: DC

## 2020-11-03 MED ORDER — MIDAZOLAM HCL 2 MG/2ML IJ SOLN: 0.5000 mg | Freq: Once | INTRAMUSCULAR | Status: DC | PRN

## 2020-11-03 MED ORDER — PHENYLEPHRINE HCL-NACL 10-0.9 MG/250ML-% IV SOLN
INTRAVENOUS | Status: DC | PRN
  Administered 2020-11-03: 25 ug/min via INTRAVENOUS

## 2020-11-03 MED ORDER — DEXAMETHASONE SODIUM PHOSPHATE 10 MG/ML IJ SOLN
INTRAMUSCULAR | Status: DC | PRN
  Administered 2020-11-03: 5 mg via INTRAVENOUS

## 2020-11-03 MED ORDER — LIDOCAINE 2% (20 MG/ML) 5 ML SYRINGE
INTRAMUSCULAR | Status: DC | PRN
  Administered 2020-11-03: 30 mg via INTRAVENOUS

## 2020-11-03 MED ORDER — PROMETHAZINE HCL 25 MG/ML IJ SOLN: 6.2500 mg | INTRAMUSCULAR | Status: DC | PRN

## 2020-11-03 MED ORDER — MIDAZOLAM HCL 5 MG/5ML IJ SOLN
INTRAMUSCULAR | Status: DC | PRN
  Administered 2020-11-03: 2 mg via INTRAVENOUS

## 2020-11-03 MED ORDER — ROCURONIUM BROMIDE 10 MG/ML (PF) SYRINGE
PREFILLED_SYRINGE | INTRAVENOUS | Status: DC | PRN
  Administered 2020-11-03: 50 mg via INTRAVENOUS

## 2020-11-03 MED ORDER — FENTANYL CITRATE (PF) 100 MCG/2ML IJ SOLN: 25.0000 ug | INTRAMUSCULAR | Status: DC | PRN

## 2020-11-03 MED ORDER — MEPERIDINE HCL 100 MG/ML IJ SOLN: 6.2500 mg | INTRAMUSCULAR | Status: DC | PRN

## 2020-11-03 MED ORDER — OXYCODONE HCL 5 MG/5ML PO SOLN: 5.0000 mg | Freq: Once | ORAL | Status: DC | PRN

## 2020-11-03 MED ORDER — PROPOFOL 10 MG/ML IV BOLUS
INTRAVENOUS | Status: DC | PRN
  Administered 2020-11-03: 150 mg via INTRAVENOUS

## 2020-11-03 MED ORDER — ONDANSETRON HCL 4 MG/2ML IJ SOLN
INTRAMUSCULAR | Status: DC | PRN
  Administered 2020-11-03: 4 mg via INTRAVENOUS

## 2020-11-03 MED ORDER — SUGAMMADEX SODIUM 200 MG/2ML IV SOLN
INTRAVENOUS | Status: DC | PRN
  Administered 2020-11-03: 150 mg via INTRAVENOUS

## 2020-11-03 MED ORDER — FENTANYL CITRATE (PF) 250 MCG/5ML IJ SOLN
INTRAMUSCULAR | Status: DC | PRN
  Administered 2020-11-03 (×2): 50 ug via INTRAVENOUS

## 2020-11-03 MED ORDER — OXYCODONE HCL 5 MG PO TABS: 5.0000 mg | ORAL_TABLET | Freq: Once | ORAL | Status: DC | PRN

## 2020-11-03 SURGICAL SUPPLY — 47 items

## 2020-11-03 NOTE — Consult Note (Signed)
Synopsis: Referred in 11/03/2020 for Lung nodule   Subjective:   PATIENT ID: Lynn Kline GENDER: female DOB: 01-21-1956, MRN: [id number]   PMH of tobacco abuse, smoked for 45 years, she recently had a CT chest with upper lobe emphysema. She recently found out that her 3 family members on her fathers side died of pulmonary fibrosis. Father with two brothers that died of lung cancer. Father died of melanoma. She is a current smoker, 5 cigs per day. She is really trying to stop. Never had any PFTs before. She gets really short of breath when she walks. If she takes her dog for a walk she will have to stop at the end of street and rest. She has chest tightness and usually can only due a few things for a few minutes and then she has to stop and sit and rest. She makes eye glasses for a living. She lives off her ventolin inhaler.  She has been using her albuterol inhaler 8-12 times per day.  She usually goes through 3-4 albuterol inhalers per month.  OV 10/24/2018: Patient has been doing well since her last visit.  She has been smoke-free for the past 2 days.  She had PFTs completed in today's office visit that revealed an FEV1 postbronchodilator of 1.08 L, 47% predicted an RV of 190%, DLCO 73%.  Patient still describes significant dyspnea on exertion.  She is having trouble getting in from her car to the house.  She does have daily cough and sputum production which is a little bit better.  She does think that the Trelegy has made a big difference with using it each day.  Overall she is a little bit anxious about the new diagnosis of COPD.  Patient denies chest pain or hemoptysis.  OV 02/26/2020: Patient here today for follow-up.  Last seen in our office by Wyn Quaker in April 2020 followed by June 2020 and again in October 2020.  Last office visit by Patricia Nettle in April 2021 telephone visit.  Per this visit she had quit smoking in March 2021.  Managed for moderate COPD last saw me in January 2020.   She was treated for exacerbation at that time congratulated on quitting smoking.  Treated with prednisone and doxycycline.  Continued recommendations for Trelegy inhaler.  Here today for follow-up after recent exacerbation.  Again today feels short of breath.  She has had significant issues with exertion.  Unfortunately she has occasionally went back to smoke a few times.  But has not smoked in the past week or more.  She is still using her Trelegy but has occasionally not use this to see if she can get by without having an inhaler.  We talked about portance of staying on regular maintenance inhaler.  OV 10/02/2020: This is a 65 year old female here today for follow-up of COPD as well as a recent lung cancer screening CT. Lung cancer screening CT was completed on 09/25/2020. This found a new left upper lobe nodule that was not present previously. The largest diameter of this kind of irregular shaped lesion is 18.6 mm. There is a more central solid component of the lesion that is approximately 1 cm with irregular margins. Overall concerning for potential underlying malignancy. Also has association with severe centrilobular and paraseptal emphysema. From a respiratory standpoint she is still very dyspneic on exertion. Since we have last seen each other she has had three exacerbations requiring steroids and antibiotics. During that time though she had resorted  back to smoking again. However the past couple weeks she states that she has quit smoking again because she "just cannot" do it anymore.  Hospital 11/03/2020: Patient presents today to the hospital for evaluation and plans for navigational bronchoscopy.     Past Medical History:  Diagnosis Date  . Anxiety   . Atherosclerosis   . Colon polyp, hyperplastic   . COPD (chronic obstructive pulmonary disease) ([place])   . Diverticulosis   . Gallstone   . Pneumonia      Family History  Problem Relation Age of Onset  . Arthritis Mother   . COPD Mother    . Emphysema Mother   . Stroke Father   . Heart disease Father   . Cancer Father        MELANOMA  . Breast cancer Maternal Grandmother   . Colon cancer Neg Hx      Past Surgical History:  Procedure Laterality Date  . ABDOMINAL HYSTERECTOMY    . CESAREAN SECTION    . CHOLECYSTECTOMY    . COLONOSCOPY      Social History   Socioeconomic History  . Marital status: Divorced    Spouse name: Not on file  . Number of children: Not on file  . Years of education: Not on file  . Highest education level: Not on file  Occupational History  . Not on file  Tobacco Use  . Smoking status: Light Tobacco Smoker    Packs/day: 1.00    Years: 50.00    Pack years: 50.00    Types: Cigarettes    Last attempt to quit: 12/16/2019    Years since quitting: 0.8  . Smokeless tobacco: Never Used  . Tobacco comment: trying to quit  Substance and Sexual Activity  . Alcohol use: No    Alcohol/week: 0.0 standard drinks  . Drug use: No  . Sexual activity: Never    Comment: 1ST INTERCOURSE- 3, PARTNERS- 5  Other Topics Concern  . Not on file  Social History Narrative  . Not on file   Social Determinants of Health   Financial Resource Strain: Not on file  Food Insecurity: Not on file  Transportation Needs: Not on file  Physical Activity: Not on file  Stress: Not on file  Social Connections: Not on file  Intimate Partner Violence: Not on file     Allergies  Allergen Reactions  . Codeine Nausea And Vomiting  . Erythromycin Nausea And Vomiting  . Ampicillin Nausea And Vomiting, Rash and Other (See Comments)    Pt states that rash was on her tongue.  Has patient had a PCN reaction causing immediate rash, facial/tongue/throat swelling, SOB or lightheadedness with hypotension yes Has patient had a PCN reaction causing severe rash involving mucus membranes or skin necrosis: no Has patient had a PCN reaction that required hospitalization yes Has patient had a PCN reaction occurring within the  last 10 years: no If all of the above answers are "NO", then may proceed with Cephalosporin use.      @ENCMEDSTART @  Review of Systems  Constitutional: Negative for chills, fever, malaise/fatigue and weight loss.  HENT: Negative for hearing loss, sore throat and tinnitus.   Eyes: Negative for blurred vision and double vision.  Respiratory: Negative for cough, hemoptysis, sputum production, shortness of breath, wheezing and stridor.   Cardiovascular: Negative for chest pain, palpitations, orthopnea, leg swelling and PND.  Gastrointestinal: Negative for abdominal pain, constipation, diarrhea, heartburn, nausea and vomiting.  Genitourinary: Negative for dysuria, hematuria and  urgency.  Musculoskeletal: Negative for joint pain and myalgias.  Skin: Negative for itching and rash.  Neurological: Negative for dizziness, tingling, weakness and headaches.  Endo/Heme/Allergies: Negative for environmental allergies. Does not bruise/bleed easily.  Psychiatric/Behavioral: Negative for depression. The patient is not nervous/anxious and does not have insomnia.   All other systems reviewed and are negative.    Objective:  Physical Exam Vitals reviewed.  Constitutional:      General: She is not in acute distress.    Appearance: She is well-developed.  HENT:     Head: Normocephalic and atraumatic.     Mouth/Throat:     Mouth: Oropharynx is clear and moist.     Pharynx: No oropharyngeal exudate.  Eyes:     Extraocular Movements: EOM normal.     Conjunctiva/sclera: Conjunctivae normal.     Pupils: Pupils are equal, round, and reactive to light.  Neck:     Vascular: No JVD.     Trachea: No tracheal deviation.     Comments: Loss of supraclavicular fat Cardiovascular:     Rate and Rhythm: Normal rate and regular rhythm.     Pulses: Intact distal pulses.     Heart sounds: S1 normal and S2 normal.     Comments: Distant heart tones Pulmonary:     Effort: No tachypnea or accessory muscle usage.      Breath sounds: No stridor. Decreased breath sounds (throughout all lung fields) present. No wheezing, rhonchi or rales.     Comments: Increased AP chest diameter Abdominal:     General: Bowel sounds are normal. There is no distension.     Palpations: Abdomen is soft.     Tenderness: There is no abdominal tenderness.  Musculoskeletal:        General: Deformity (muscle wasting ) present. No edema.  Skin:    General: Skin is warm and dry.     Capillary Refill: Capillary refill takes less than 2 seconds.     Findings: No rash.  Neurological:     Mental Status: She is alert and oriented to person, place, and time.  Psychiatric:        Mood and Affect: Mood and affect normal.        Behavior: Behavior normal.      Vitals:   11/03/20 0713  BP: 121/86  Pulse: 96  Resp: 18  Temp: 97.8 F (36.6 C)  TempSrc: Oral  SpO2: 95%  Weight: 66.2 kg  Height: 5' 2" (1.575 m)   95% on RA BMI Readings from Last 3 Encounters:  11/03/20 26.70 kg/m  10/02/20 26.70 kg/m  10/01/20 27.25 kg/m   Wt Readings from Last 3 Encounters:  11/03/20 66.2 kg  10/02/20 66.2 kg  10/01/20 67.6 kg     CBC    Component Value Date/Time   WBC 7.2 08/10/2020 1403   RBC 4.43 08/10/2020 1403   HGB 13.4 08/10/2020 1403   HCT 39.8 08/10/2020 1403   PLT 373.0 08/10/2020 1403   MCV 89.8 08/10/2020 1403   MCH 30.8 11/22/2016 0832   MCHC 33.8 08/10/2020 1403   RDW 13.8 08/10/2020 1403   LYMPHSABS 2.8 08/10/2020 1403   MONOABS 0.4 08/10/2020 1403   EOSABS 0.2 08/10/2020 1403   BASOSABS 0.1 08/10/2020 1403     Chest Imaging: 10/23/2020: SUperD CT  Irregular nodular density within the left upper lobe consistent with previously seen hypermetabolic lesions on the PET CT imaging. The patient's images have been independently reviewed by me.    Pulmonary   Functions Testing Results: PFT Results Latest Ref Rng & Units 10/24/2018  FVC-Pre L 2.22  FVC-Predicted Pre % 74  FVC-Post L 2.15  FVC-Predicted  Post % 72  Pre FEV1/FVC % % 52  Post FEV1/FCV % % 50  FEV1-Pre L 1.17  FEV1-Predicted Pre % 51  FEV1-Post L 1.08  DLCO uncorrected ml/min/mmHg 15.91  DLCO UNC% % 73  DLVA Predicted % 83  TLC L 6.19  TLC % Predicted % 130  RV % Predicted % 190    FeNO:   Pathology:   Echocardiogram:   Heart Catheterization:     Assessment & Plan:   Multiple nodules of the left upper lobe Abnormal PET scan Abnormal lung cancer screening CT  Discussion:  Prior to the procedure we discussed the risk benefits and alternatives of proceeding with navigational bronchoscopy. We discussed the risk of bleeding pneumothorax as well as general anesthesia. Patient is agreeable to proceed.  No barriers at this time.   Current Facility-Administered Medications:  .  lactated ringers infusion, , Intravenous, Continuous, Annye Asa, MD   Garner Nash, DO [facility] 11/03/2020 7:34 AM

## 2020-11-03 NOTE — Anesthesia Postprocedure Evaluation (Signed)
Anesthesia Post Note  Patient: KENYATA NAPIER  Procedure(s) Performed: VIDEO BRONCHOSCOPY WITH ENDOBRONCHIAL NAVIGATION AND POSSIBLE ULTRASOUND (N/A ) BRONCHIAL WASHINGS BRONCHIAL BRUSHINGS BRONCHIAL NEEDLE ASPIRATION BIOPSIES BRONCHIAL BIOPSIES FIDUCIAL MARKER PLACEMENT VIDEO BRONCHOSCOPY WITH ENDOBRONCHIAL ULTRASOUND     Patient location during evaluation: PACU Anesthesia Type: General Level of consciousness: awake and alert Pain management: pain level controlled Vital Signs Assessment: post-procedure vital signs reviewed and stable Respiratory status: spontaneous breathing, nonlabored ventilation, respiratory function stable and patient connected to nasal cannula oxygen Cardiovascular status: blood pressure returned to baseline and stable Postop Assessment: no apparent nausea or vomiting Anesthetic complications: no   No complications documented.  Last Vitals:  Vitals:   11/03/20 1141 11/03/20 1155  BP: 103/69 106/63  Pulse: 82 80  Resp: 14 16  Temp:  36.7 C  SpO2: (!) 87% 92%    Last Pain:  Vitals:   11/03/20 1155  TempSrc:   PainSc: 0-No pain                 Kristof Nadeem S

## 2020-11-03 NOTE — Discharge Instructions (Addendum)
Flexible Bronchoscopy, Care After This sheet gives you information about how to care for yourself after your test. Your doctor may also give you more specific instructions. If you have problems or questions, contact your doctor. Follow these instructions at home: Eating and drinking  Do not eat or drink anything (not even water) for 2 hours after your test, or until your numbing medicine (local anesthetic) wears off.  When your numbness is gone and your cough and gag reflexes have come back, you may: ? Eat only soft foods. ? Slowly drink liquids.  The day after the test, go back to your normal diet. Driving  Do not drive for 24 hours if you were given a medicine to help you relax (sedative).  Do not drive or use heavy machinery while taking prescription pain medicine. General instructions   Take over-the-counter and prescription medicines only as told by your doctor.  Return to your normal activities as told. Ask what activities are safe for you.  Do not use any products that have nicotine or tobacco in them. This includes cigarettes and e-cigarettes. If you need help quitting, ask your doctor.  Keep all follow-up visits as told by your doctor. This is important. It is very important if you had a tissue sample (biopsy) taken. Get help right away if:  You have shortness of breath that gets worse.  You get light-headed.  You feel like you are going to pass out (faint).  You have chest pain.  You cough up: ? More than a little blood. ? More blood than before. Summary  Do not eat or drink anything (not even water) for 2 hours after your test, or until your numbing medicine wears off.  Do not use cigarettes. Do not use e-cigarettes.  Get help right away if you have chest pain.   This information is not intended to replace advice given to you by your health care provider. Make sure you discuss any questions you have with your health care provider. Document Released:  07/31/2009 Document Revised: 09/15/2017 Document Reviewed: 10/21/2016 Elsevier Patient Education  2020 [employer].

## 2020-11-03 NOTE — Anesthesia Procedure Notes (Signed)
Procedure Name: Intubation Date/Time: 11/03/2020 9:27 AM Performed by: Renato Shin, CRNA Pre-anesthesia Checklist: Patient identified, Emergency Drugs available, Suction available and Patient being monitored Patient Re-evaluated:Patient Re-evaluated prior to induction Oxygen Delivery Method: Circle system utilized Preoxygenation: Pre-oxygenation with 100% oxygen Induction Type: IV induction Ventilation: Mask ventilation without difficulty Laryngoscope Size: Miller and 2 Grade View: Grade I Tube type: Oral Tube size: 8.5 mm Number of attempts: 1 Airway Equipment and Method: Stylet and Oral airway Placement Confirmation: ETT inserted through vocal cords under direct vision,  positive ETCO2 and breath sounds checked- equal and bilateral Secured at: 22 cm Tube secured with: Tape Dental Injury: Teeth and Oropharynx as per pre-operative assessment

## 2020-11-03 NOTE — Op Note (Signed)
Video Bronchoscopy with Electromagnetic Navigation Procedure Note Video Bronchoscopy with Endobronchial Ultrasound Procedure Note  Date of Operation: 11/03/2020  Pre-op Diagnosis: Left upper lobe multiple pulmonary nodules, mediastinal adenopathy  Post-op Diagnosis: Left upper lobe multiple pulmonary nodules, mediastinal adenopathy  Surgeon: Garner Nash, DO   Assistants: None  Anesthesia: General endotracheal anesthesia  Operation: Flexible video fiberoptic bronchoscopy with electromagnetic navigation and biopsies.  Estimated Blood Loss: Minimal  Complications: None   Indications and History: Lynn Kline is a 65 y.o. female with left upper lobe pulmonary nodules, mediastinal adenopathy.  The risks, benefits, complications, treatment options and expected outcomes were discussed with the patient.  The possibilities of pneumothorax, pneumonia, reaction to medication, pulmonary aspiration, perforation of a viscus, bleeding, failure to diagnose a condition and creating a complication requiring transfusion or operation were discussed with the patient who freely signed the consent.    Description of Procedure: The patient was seen in the Preoperative Area, was examined and was deemed appropriate to proceed.  The patient was taken to [facility] endoscopy room 2, identified as Lynn Kline and the procedure verified as Flexible Video Fiberoptic Bronchoscopy.  A Time Out was held and the above information confirmed.   Prior to the date of the procedure a high-resolution CT scan of the chest was performed. Utilizing [place] a virtual tracheobronchial tree was generated to allow the creation of distinct navigation pathways to the patient's parenchymal abnormalities. After being taken to the operating room general anesthesia was initiated and the patient  was orally intubated. The video fiberoptic bronchoscope was introduced via the endotracheal tube and a general inspection was  performed which showed normal right and left lung anatomy, no evidence of endobronchial lesion, scattered bronchial pitting and striations from history of smoking.   Target #1 left upper lobe most apical lesion: The extendable working channel and locator guide were introduced into the bronchoscope.  Full fluoroscopic sweep from RAO 25 degrees to LAO 25 degrees with inspiratory breath-hold at 20 cm of water were completed for fluoroscopic navigation. The distinct navigation pathways prepared prior to this procedure were then utilized to navigate to within 0.8 cm of patient's lesion(s) identified on CT scan. The extendable working channel was secured into place and the locator guide was withdrawn. Under fluoroscopic guidance transbronchial needle brushings, transbronchial Wang needle biopsies, and transbronchial forceps biopsies were performed to be sent for cytology and pathology.   Target #2 left upper lobe inferior lateral lesion: The extendable working channel and locator guide were introduced into the bronchoscope. The distinct navigation pathways prepared prior to this procedure were then utilized to navigate to within 0.8 cm of patient's lesion(s) identified on CT scan. The extendable working channel was secured into place and the locator guide was withdrawn. Under fluoroscopic guidance transbronchial needle brushings, transbronchial Wang needle biopsies, and transbronchial forceps biopsies were performed to be sent for cytology and pathology. A bronchioalveolar lavage was performed in the left upper lobe and sent for cytology and microbiology (bacterial, fungal, AFB smears and cultures).   Following sampling of the both left upper lobe adjacent lesions fiducial markers were placed around both lesions, 3 fiducial markers were placed using the fiducial guide catheter, wire system with direct visualization under fluoroscopy.  All fiducial markers were placed within 2.5 to 3 cm of lesional  center.  Endobronchial ultrasound description of Procedure: The standard scope was then withdrawn and the endobronchial ultrasound was used to identify and characterize the peritracheal, hilar and bronchial lymph nodes. Inspection  showed no visible lesion within the left hilum that was identified as having mild PET uptake on recent pet imaging.  There was no visible nodal structure visualized.  The remainder of the stations were also clear except for a 1.5 cm cross-section station 7 lymph node.. Using real-time ultrasound guidance Wang needle biopsies were take from Station 7 nodes and were sent for cytology. The patient tolerated the procedure well without apparent complications. There was no significant blood loss.   Standard therapeutic bronchoscope was reinserted to the airway and airways were examined for any significant bleeding blood clots or secretions.  Bilateral mainstem's were aspirated and the distal subsegments were patent at the termination of the procedure.  Bronchoscope was brought just above the main carina there is no evidence of active bleeding. The bronchoscope was removed.  The patient tolerated the procedure well. There was no significant blood loss and there were no obvious complications. A post-procedural chest x-ray is pending.  Samples Target #1 left upper lobe apical nodule: 1. Transbronchial needle brushings from left upper lobe 2. Transbronchial Wang needle biopsies from left upper lobe 3. Transbronchial forceps biopsies from left upper lobe 4. Bronchoalveolar lavage from left upper lobe  Samples target #2 left upper lobe more inferior lateral lesion: 1. Transbronchial needle brushings from left upper lobe 2. Transbronchial Wang needle biopsies from left upper lobe 3. Transbronchial forceps biopsies from left upper lobe 4. Bronchoalveolar lavage from left upper lobe  Samples endobronchial ultrasound-guided: 1. Wang needle biopsies from station 7 node  Plans:  The  patient will be discharged from the PACU to home when recovered from anesthesia and after chest x-ray is reviewed. We will review the cytology, pathology and microbiology results with the patient when they become available. Outpatient followup will be with Garner Nash, DO.    Garner Nash, DO [facility] 11/03/2020 10:56 AM

## 2020-11-03 NOTE — Anesthesia Preprocedure Evaluation (Addendum)
Anesthesia Evaluation  Patient identified by MRN, date of birth, ID band Patient awake    Reviewed: Allergy & Precautions, NPO status , Patient's Chart, lab work & pertinent test results  History of Anesthesia Complications Negative for: history of anesthetic complications  Airway Mallampati: II  TM Distance: >3 FB Neck ROM: Full    Dental  (+) Dental Advisory Given, Edentulous Upper   Pulmonary COPD,  COPD inhaler, Current Smoker and Patient abstained from smoking.,  10/31/2020 SARS coronavirus NEG   breath sounds clear to auscultation       Cardiovascular (-) angina+ CAD (Mild RCA plaque on CT)   Rhythm:Regular Rate:Normal  08/2020 Nuclear stress EF: 67%. No wall motion abnormalities noted.  There was no ST segment deviation noted during stress.  Defect 1: There is a small defect of mild severity present in the apical  inferior location.  This is a low risk study. No significant ischemia identified   Neuro/Psych Anxiety negative neurological ROS     GI/Hepatic negative GI ROS, Neg liver ROS,   Endo/Other  negative endocrine ROS  Renal/GU negative Renal ROS     Musculoskeletal   Abdominal   Peds  Hematology negative hematology ROS (+)   Anesthesia Other Findings   Reproductive/Obstetrics                            Anesthesia Physical Anesthesia Plan  ASA: III  Anesthesia Plan: General   Post-op Pain Management:    Induction: Intravenous  PONV Risk Score and Plan: 2 and Treatment may vary due to age or medical condition  Airway Management Planned: Oral ETT  Additional Equipment: None  Intra-op Plan:   Post-operative Plan: Extubation in OR  Informed Consent: I have reviewed the patients History and Physical, chart, labs and discussed the procedure including the risks, benefits and alternatives for the proposed anesthesia with the patient or authorized representative who  has indicated his/her understanding and acceptance.     Dental advisory given  Plan Discussed with: CRNA and Surgeon  Anesthesia Plan Comments:        Anesthesia Quick Evaluation

## 2020-11-03 NOTE — Interval H&P Note (Signed)
History and Physical Interval Note:  11/03/2020 7:39 AM  Lynn Kline  has presented today for surgery, with the diagnosis of LUNG NODULES.  The various methods of treatment have been discussed with the patient and family. After consideration of risks, benefits and other options for treatment, the patient has consented to  Procedure(s): [place] (N/A) as a surgical intervention.  The patient's history has been reviewed, patient examined, no change in status, stable for surgery.  I have reviewed the patient's chart and labs.  Questions were answered to the patient's satisfaction.     [place]

## 2020-11-03 NOTE — H&P (View-Only) (Signed)
Synopsis: Referred in 11/03/2020 for Lung nodule   Subjective:   PATIENT ID: Lynn Kline GENDER: female DOB: 1955/11/17, MRN: [id number]   PMH of tobacco abuse, smoked for 45 years, she recently had a CT chest with upper lobe emphysema. She recently found out that her 3 family members on her fathers side died of pulmonary fibrosis. Father with two brothers that died of lung cancer. Father died of melanoma. She is a current smoker, 5 cigs per day. She is really trying to stop. Never had any PFTs before. She gets really short of breath when she walks. If she takes her dog for a walk she will have to stop at the end of street and rest. She has chest tightness and usually can only due a few things for a few minutes and then she has to stop and sit and rest. She makes eye glasses for a living. She lives off her ventolin inhaler.  She has been using her albuterol inhaler 8-12 times per day.  She usually goes through 3-4 albuterol inhalers per month.  OV 10/24/2018: Patient has been doing well since her last visit.  She has been smoke-free for the past 2 days.  She had PFTs completed in today's office visit that revealed an FEV1 postbronchodilator of 1.08 L, 47% predicted an RV of 190%, DLCO 73%.  Patient still describes significant dyspnea on exertion.  She is having trouble getting in from her car to the house.  She does have daily cough and sputum production which is a little bit better.  She does think that the Trelegy has made a big difference with using it each day.  Overall she is a little bit anxious about the new diagnosis of COPD.  Patient denies chest pain or hemoptysis.  OV 02/26/2020: Patient here today for follow-up.  Last seen in our office by Wyn Quaker in April 2020 followed by June 2020 and again in October 2020.  Last office visit by Patricia Nettle in April 2021 telephone visit.  Per this visit she had quit smoking in March 2021.  Managed for moderate COPD last saw me in January 2020.   She was treated for exacerbation at that time congratulated on quitting smoking.  Treated with prednisone and doxycycline.  Continued recommendations for Trelegy inhaler.  Here today for follow-up after recent exacerbation.  Again today feels short of breath.  She has had significant issues with exertion.  Unfortunately she has occasionally went back to smoke a few times.  But has not smoked in the past week or more.  She is still using her Trelegy but has occasionally not use this to see if she can get by without having an inhaler.  We talked about portance of staying on regular maintenance inhaler.  OV 10/02/2020: This is a 65 year old female here today for follow-up of COPD as well as a recent lung cancer screening CT. Lung cancer screening CT was completed on 09/25/2020. This found a new left upper lobe nodule that was not present previously. The largest diameter of this kind of irregular shaped lesion is 18.6 mm. There is a more central solid component of the lesion that is approximately 1 cm with irregular margins. Overall concerning for potential underlying malignancy. Also has association with severe centrilobular and paraseptal emphysema. From a respiratory standpoint she is still very dyspneic on exertion. Since we have last seen each other she has had three exacerbations requiring steroids and antibiotics. During that time though she had resorted  back to smoking again. However the past couple weeks she states that she has quit smoking again because she "just cannot" do it anymore.  Hospital 11/03/2020: Patient presents today to the hospital for evaluation and plans for navigational bronchoscopy.     Past Medical History:  Diagnosis Date  . Anxiety   . Atherosclerosis   . Colon polyp, hyperplastic   . COPD (chronic obstructive pulmonary disease) ([place])   . Diverticulosis   . Gallstone   . Pneumonia      Family History  Problem Relation Age of Onset  . Arthritis Mother   . COPD Mother    . Emphysema Mother   . Stroke Father   . Heart disease Father   . Cancer Father        MELANOMA  . Breast cancer Maternal Grandmother   . Colon cancer Neg Hx      Past Surgical History:  Procedure Laterality Date  . ABDOMINAL HYSTERECTOMY    . CESAREAN SECTION    . CHOLECYSTECTOMY    . COLONOSCOPY      Social History   Socioeconomic History  . Marital status: Divorced    Spouse name: Not on file  . Number of children: Not on file  . Years of education: Not on file  . Highest education level: Not on file  Occupational History  . Not on file  Tobacco Use  . Smoking status: Light Tobacco Smoker    Packs/day: 1.00    Years: 50.00    Pack years: 50.00    Types: Cigarettes    Last attempt to quit: 12/16/2019    Years since quitting: 0.8  . Smokeless tobacco: Never Used  . Tobacco comment: trying to quit  Substance and Sexual Activity  . Alcohol use: No    Alcohol/week: 0.0 standard drinks  . Drug use: No  . Sexual activity: Never    Comment: 1ST INTERCOURSE- 3, PARTNERS- 5  Other Topics Concern  . Not on file  Social History Narrative  . Not on file   Social Determinants of Health   Financial Resource Strain: Not on file  Food Insecurity: Not on file  Transportation Needs: Not on file  Physical Activity: Not on file  Stress: Not on file  Social Connections: Not on file  Intimate Partner Violence: Not on file     Allergies  Allergen Reactions  . Codeine Nausea And Vomiting  . Erythromycin Nausea And Vomiting  . Ampicillin Nausea And Vomiting, Rash and Other (See Comments)    Pt states that rash was on her tongue.  Has patient had a PCN reaction causing immediate rash, facial/tongue/throat swelling, SOB or lightheadedness with hypotension yes Has patient had a PCN reaction causing severe rash involving mucus membranes or skin necrosis: no Has patient had a PCN reaction that required hospitalization yes Has patient had a PCN reaction occurring within the  last 10 years: no If all of the above answers are "NO", then may proceed with Cephalosporin use.      @ENCMEDSTART @  Review of Systems  Constitutional: Negative for chills, fever, malaise/fatigue and weight loss.  HENT: Negative for hearing loss, sore throat and tinnitus.   Eyes: Negative for blurred vision and double vision.  Respiratory: Negative for cough, hemoptysis, sputum production, shortness of breath, wheezing and stridor.   Cardiovascular: Negative for chest pain, palpitations, orthopnea, leg swelling and PND.  Gastrointestinal: Negative for abdominal pain, constipation, diarrhea, heartburn, nausea and vomiting.  Genitourinary: Negative for dysuria, hematuria and  urgency.  Musculoskeletal: Negative for joint pain and myalgias.  Skin: Negative for itching and rash.  Neurological: Negative for dizziness, tingling, weakness and headaches.  Endo/Heme/Allergies: Negative for environmental allergies. Does not bruise/bleed easily.  Psychiatric/Behavioral: Negative for depression. The patient is not nervous/anxious and does not have insomnia.   All other systems reviewed and are negative.    Objective:  Physical Exam Vitals reviewed.  Constitutional:      General: She is not in acute distress.    Appearance: She is well-developed.  HENT:     Head: Normocephalic and atraumatic.     Mouth/Throat:     Mouth: Oropharynx is clear and moist.     Pharynx: No oropharyngeal exudate.  Eyes:     Extraocular Movements: EOM normal.     Conjunctiva/sclera: Conjunctivae normal.     Pupils: Pupils are equal, round, and reactive to light.  Neck:     Vascular: No JVD.     Trachea: No tracheal deviation.     Comments: Loss of supraclavicular fat Cardiovascular:     Rate and Rhythm: Normal rate and regular rhythm.     Pulses: Intact distal pulses.     Heart sounds: S1 normal and S2 normal.     Comments: Distant heart tones Pulmonary:     Effort: No tachypnea or accessory muscle usage.      Breath sounds: No stridor. Decreased breath sounds (throughout all lung fields) present. No wheezing, rhonchi or rales.     Comments: Increased AP chest diameter Abdominal:     General: Bowel sounds are normal. There is no distension.     Palpations: Abdomen is soft.     Tenderness: There is no abdominal tenderness.  Musculoskeletal:        General: Deformity (muscle wasting ) present. No edema.  Skin:    General: Skin is warm and dry.     Capillary Refill: Capillary refill takes less than 2 seconds.     Findings: No rash.  Neurological:     Mental Status: She is alert and oriented to person, place, and time.  Psychiatric:        Mood and Affect: Mood and affect normal.        Behavior: Behavior normal.      Vitals:   11/03/20 0713  BP: 121/86  Pulse: 96  Resp: 18  Temp: 97.8 F (36.6 C)  TempSrc: Oral  SpO2: 95%  Weight: 66.2 kg  Height: 5\' 2"  (1.575 m)   95% on RA BMI Readings from Last 3 Encounters:  11/03/20 26.70 kg/m  10/02/20 26.70 kg/m  10/01/20 27.25 kg/m   Wt Readings from Last 3 Encounters:  11/03/20 66.2 kg  10/02/20 66.2 kg  10/01/20 67.6 kg     CBC    Component Value Date/Time   WBC 7.2 08/10/2020 1403   RBC 4.43 08/10/2020 1403   HGB 13.4 08/10/2020 1403   HCT 39.8 08/10/2020 1403   PLT 373.0 08/10/2020 1403   MCV 89.8 08/10/2020 1403   MCH 30.8 11/22/2016 0832   MCHC 33.8 08/10/2020 1403   RDW 13.8 08/10/2020 1403   LYMPHSABS 2.8 08/10/2020 1403   MONOABS 0.4 08/10/2020 1403   EOSABS 0.2 08/10/2020 1403   BASOSABS 0.1 08/10/2020 1403     Chest Imaging: 10/23/2020: [facility] CT  Irregular nodular density within the left upper lobe consistent with previously seen hypermetabolic lesions on the PET CT imaging. The patient's images have been independently reviewed by me.    Pulmonary  Functions Testing Results: PFT Results Latest Ref Rng & Units 10/24/2018  FVC-Pre L 2.22  FVC-Predicted Pre % 74  FVC-Post L 2.15  FVC-Predicted  Post % 72  Pre FEV1/FVC % % 52  Post FEV1/FCV % % 50  FEV1-Pre L 1.17  FEV1-Predicted Pre % 51  FEV1-Post L 1.08  DLCO uncorrected ml/min/mmHg 15.91  DLCO [facility]% % 73  DLVA Predicted % 83  TLC L 6.19  TLC % Predicted % 130  RV % Predicted % 190    FeNO:   Pathology:   Echocardiogram:   Heart Catheterization:     Assessment & Plan:   Multiple nodules of the left upper lobe Abnormal PET scan Abnormal lung cancer screening CT  Discussion:  Prior to the procedure we discussed the risk benefits and alternatives of proceeding with navigational bronchoscopy. We discussed the risk of bleeding pneumothorax as well as general anesthesia. Patient is agreeable to proceed.  No barriers at this time.   Current Facility-Administered Medications:  .  lactated ringers infusion, , Intravenous, Continuous, Annye Asa, MD   Garner Nash, DO [facility] 11/03/2020 7:34 AM

## 2020-11-03 NOTE — Transfer of Care (Signed)
Immediate Anesthesia Transfer of Care Note  Patient: Lynn Kline  Procedure(s) Performed: VIDEO BRONCHOSCOPY WITH ENDOBRONCHIAL NAVIGATION AND POSSIBLE ULTRASOUND (N/A ) BRONCHIAL WASHINGS BRONCHIAL BRUSHINGS BRONCHIAL NEEDLE ASPIRATION BIOPSIES BRONCHIAL BIOPSIES FIDUCIAL MARKER PLACEMENT VIDEO BRONCHOSCOPY WITH ENDOBRONCHIAL ULTRASOUND  Patient Location: PACU  Anesthesia Type:General  Level of Consciousness: awake and patient cooperative  Airway & Oxygen Therapy: Patient Spontanous Breathing and Patient connected to face mask oxygen  Post-op Assessment: Report given to RN and Post -op Vital signs reviewed and stable  Post vital signs: Reviewed and stable  Last Vitals:  Vitals Value Taken Time  BP 152/90 11/03/20 1111  Temp 36.6 C 11/03/20 1110  Pulse 89 11/03/20 1120  Resp 17 11/03/20 1120  SpO2 99 % 11/03/20 1120  Vitals shown include unvalidated device data.  Last Pain:  Vitals:   11/03/20 1110  TempSrc:   PainSc: Asleep      Patients Stated Pain Goal: 4 (81/82/99 3716)  Complications: No complications documented.

## 2020-11-04 ENCOUNTER — Encounter (HOSPITAL_COMMUNITY): Payer: Self-pay | Admitting: Pulmonary Disease

## 2020-11-05 LAB — ACID FAST SMEAR (AFB, MYCOBACTERIA): Acid Fast Smear: NEGATIVE

## 2020-11-06 ENCOUNTER — Telehealth: Payer: Self-pay | Admitting: Pulmonary Disease

## 2020-11-06 DIAGNOSIS — R911 Solitary pulmonary nodule: Secondary | ICD-10-CM

## 2020-11-06 LAB — CYTOLOGY - NON PAP

## 2020-11-06 MED ORDER — LEVOFLOXACIN 500 MG PO TABS: 500.0000 mg | ORAL_TABLET | Freq: Every day | ORAL | 0 refills | Status: AC

## 2020-11-06 NOTE — Telephone Encounter (Signed)
PCCM:  I spoke with Dr. Vic Ripper from pathology. He favors reactive cells.  Please order repeat CT chest in 3 months in short term follow up  Some cultures are positive. We will treat with course of abx. Orders placed   Leigh, please call and inform patient. I spoke with her this morning.   Garner Nash, DO [facility] 11/06/2020 3:40 PM

## 2020-11-06 NOTE — Telephone Encounter (Signed)
PCCM:  Reviewed pathology results from recent navigational bronchoscopy.  Atypical cells present on both left upper lobe targets.  I will reach out to pathology to discuss case.  I discussed this with the patient via phone this morning.   Garner Nash, DO [facility] 11/06/2020 12:55 PM

## 2020-11-06 NOTE — Telephone Encounter (Signed)
I called and spoke with pt and she is aware of the abx that has been sent to the pharmacy per BI.  She is aware orf the order for the CT scan in 3 months.  She will call back for any further questions or concerns.

## 2020-11-07 NOTE — Telephone Encounter (Signed)
Please make sure patient has a follow up appointment with me or [facility] after 3 month follow up CXR. Thanks so much

## 2020-11-08 LAB — AEROBIC/ANAEROBIC CULTURE W GRAM STAIN (SURGICAL/DEEP WOUND): Gram Stain: NONE SEEN

## 2020-12-03 LAB — FUNGUS CULTURE WITH STAIN

## 2020-12-03 LAB — FUNGAL ORGANISM REFLEX

## 2020-12-03 LAB — FUNGUS CULTURE RESULT

## 2020-12-18 LAB — ACID FAST CULTURE WITH REFLEXED SENSITIVITIES (MYCOBACTERIA): Acid Fast Culture: NEGATIVE

## 2020-12-23 ENCOUNTER — Telehealth: Payer: Self-pay | Admitting: Pulmonary Disease

## 2020-12-23 NOTE — Telephone Encounter (Signed)
Called and spoke with Patient. Patient stated she was told by pharmacy that Ventolin was no longer covered by insurance as rescue inhaler. Patient stated she was going to call insurance company to verify what rescue inhaler is preferred and will call office back with inhaler name. Nothing further at this time.

## 2021-01-05 ENCOUNTER — Telehealth: Payer: Self-pay | Admitting: Pulmonary Disease

## 2021-01-05 DIAGNOSIS — J441 Chronic obstructive pulmonary disease with (acute) exacerbation: Secondary | ICD-10-CM

## 2021-01-05 MED ORDER — VENTOLIN HFA 108 (90 BASE) MCG/ACT IN AERS: INHALATION_SPRAY | RESPIRATORY_TRACT | 3 refills | Status: DC

## 2021-01-05 NOTE — Telephone Encounter (Signed)
Called and spoke with Patient. Patient stated she is needed a refill of Ventolin to be sent to [facility].  Patient stated per insurance she has to have Ventolin, not Albuterol. Ventolin was on Patient current med list. Ventolin refill sent to requested pharmacy. Nothing further at this time.

## 2021-01-15 ENCOUNTER — Other Ambulatory Visit: Payer: Self-pay

## 2021-01-15 ENCOUNTER — Ambulatory Visit (INDEPENDENT_AMBULATORY_CARE_PROVIDER_SITE_OTHER)
Admission: RE | Admit: 2021-01-15 | Discharge: 2021-01-15 | Disposition: A | Discharge status: Home / Self Care | Payer: Medicare Other | Source: Ambulatory Visit | Attending: Pulmonary Disease | Admitting: Pulmonary Disease

## 2021-01-15 DIAGNOSIS — Z8701 Personal history of pneumonia (recurrent): Secondary | ICD-10-CM | POA: Diagnosis not present

## 2021-01-15 DIAGNOSIS — R911 Solitary pulmonary nodule: Secondary | ICD-10-CM | POA: Diagnosis not present

## 2021-01-15 DIAGNOSIS — J929 Pleural plaque without asbestos: Secondary | ICD-10-CM | POA: Diagnosis not present

## 2021-01-15 DIAGNOSIS — J8410 Pulmonary fibrosis, unspecified: Secondary | ICD-10-CM | POA: Diagnosis not present

## 2021-01-15 DIAGNOSIS — J439 Emphysema, unspecified: Secondary | ICD-10-CM | POA: Diagnosis not present

## 2021-01-18 ENCOUNTER — Ambulatory Visit: Payer: Medicare Other | Admitting: Pulmonary Disease

## 2021-01-18 ENCOUNTER — Other Ambulatory Visit: Payer: Self-pay

## 2021-01-18 ENCOUNTER — Encounter: Payer: Self-pay | Admitting: Pulmonary Disease

## 2021-01-18 VITALS — BP 110/72 | HR 87 | Temp 98.2°F | Ht 62.0 in | Wt 145.0 lb

## 2021-01-18 DIAGNOSIS — J432 Centrilobular emphysema: Secondary | ICD-10-CM | POA: Diagnosis not present

## 2021-01-18 DIAGNOSIS — J441 Chronic obstructive pulmonary disease with (acute) exacerbation: Secondary | ICD-10-CM

## 2021-01-18 DIAGNOSIS — R918 Other nonspecific abnormal finding of lung field: Secondary | ICD-10-CM | POA: Diagnosis not present

## 2021-01-18 DIAGNOSIS — R062 Wheezing: Secondary | ICD-10-CM | POA: Diagnosis not present

## 2021-01-18 DIAGNOSIS — R911 Solitary pulmonary nodule: Secondary | ICD-10-CM

## 2021-01-18 DIAGNOSIS — F17210 Nicotine dependence, cigarettes, uncomplicated: Secondary | ICD-10-CM | POA: Diagnosis not present

## 2021-01-18 MED ORDER — BREZTRI AEROSPHERE 160-9-4.8 MCG/ACT IN AERO: 2.0000 | INHALATION_SPRAY | Freq: Two times a day (BID) | RESPIRATORY_TRACT | 0 refills | Status: DC

## 2021-01-18 MED ORDER — BREZTRI AEROSPHERE 160-9-4.8 MCG/ACT IN AERO: 2.0000 | INHALATION_SPRAY | Freq: Two times a day (BID) | RESPIRATORY_TRACT | 6 refills | Status: DC

## 2021-01-18 MED ORDER — PREDNISONE 10 MG PO TABS: 20.0000 mg | ORAL_TABLET | Freq: Every day | ORAL | 0 refills | Status: AC

## 2021-01-18 NOTE — Progress Notes (Signed)
Smoking Cessation Counseling:   The patient's current tobacco use: now at 0 cigarettes toda, 3-5 per days The patient was advised to quit and impact of smoking on their health.  I assessed the patient's willingness to attempt to quit. I provided methods and skills for cessation. We reviewed medication management of smoking session drugs if appropriate. Resources to help quit smoking were provided. A smoking cessation quit date was set: 01/19/2021 Follow-up was arranged in our clinic.  The amount of time spent counseling patient was 4 mins    Garner Nash, DO [facility] 01/18/2021 9:09 AM

## 2021-01-18 NOTE — Progress Notes (Signed)
Synopsis: Referred in November 2019 for emphysema by Janith Lima, MD  Subjective:   PATIENT ID: Lynn Kline GENDER: female DOB: 18-Jan-1956, MRN: [id number]  Chief Complaint  Patient presents with  . Follow-up    Allergies and congestion, Sob a little worse since last visit, neb 2x day, some wheezing    PMH of tobacco abuse, smoked for 45 years, she recently had a CT chest with upper lobe emphysema. She recently found out that her 3 family members on her fathers side died of pulmonary fibrosis. Father with two brothers that died of lung cancer. Father died of melanoma. She is a current smoker, 5 cigs per day. She is really trying to stop. Never had any PFTs before. She gets really short of breath when she walks. If she takes her dog for a walk she will have to stop at the end of street and rest. She has chest tightness and usually can only due a few things for a few minutes and then she has to stop and sit and rest. She makes eye glasses for a living. She lives off her ventolin inhaler.  She has been using her albuterol inhaler 8-12 times per day.  She usually goes through 3-4 albuterol inhalers per month.  OV 10/24/2018: Patient has been doing well since her last visit.  She has been smoke-free for the past 2 days.  She had PFTs completed in today's office visit that revealed an FEV1 postbronchodilator of 1.08 L, 47% predicted an RV of 190%, DLCO 73%.  Patient still describes significant dyspnea on exertion.  She is having trouble getting in from her car to the house.  She does have daily cough and sputum production which is a little bit better.  She does think that the Trelegy has made a big difference with using it each day.  Overall she is a little bit anxious about the new diagnosis of COPD.  Patient denies chest pain or hemoptysis.  OV 02/26/2020: Patient here today for follow-up.  Last seen in our office by Wyn Quaker in April 2020 followed by June 2020 and again in October 2020.  Last  office visit by Patricia Nettle in April 2021 telephone visit.  Per this visit she had quit smoking in March 2021.  Managed for moderate COPD last saw me in January 2020.  She was treated for exacerbation at that time congratulated on quitting smoking.  Treated with prednisone and doxycycline.  Continued recommendations for Trelegy inhaler.  Here today for follow-up after recent exacerbation.  Again today feels short of breath.  She has had significant issues with exertion.  Unfortunately she has occasionally went back to smoke a few times.  But has not smoked in the past week or more.  She is still using her Trelegy but has occasionally not use this to see if she can get by without having an inhaler.  We talked about portance of staying on regular maintenance inhaler.  OV 10/02/2020: This is a 65 year old female here today for follow-up of COPD as well as a recent lung cancer screening CT. Lung cancer screening CT was completed on 09/25/2020. This found a new left upper lobe nodule that was not present previously. The largest diameter of this kind of irregular shaped lesion is 18.6 mm. There is a more central solid component of the lesion that is approximately 1 cm with irregular margins. Overall concerning for potential underlying malignancy. Also has association with severe centrilobular and paraseptal emphysema. From a  respiratory standpoint she is still very dyspneic on exertion. Since we have last seen each other she has had three exacerbations requiring steroids and antibiotics. During that time though she had resorted back to smoking again. However the past couple weeks she states that she has quit smoking again because she "just cannot" do it anymore.  OV 01/18/2021: Patient here today for follow-up after bronchoscopy.  Patient's lung nodules were all negative for malignancy.  Patient here today for follow-up after CT imaging that was completed on 01/15/2021.  Patient's left upper lobe nodules remain stable  new small 6 mm nodule within the right middle lobe.  Unfortunately she started smoking again in between the last time I saw her.  She was down to 3 to 5 cigarettes a day.  She states she has not had a cigarette in the past couple of days.  She does have shortness of breath and wheezing at this time.  Does not like use of dry powder inhaler.  She is been on Trelegy for some time.  Would like to consider movement to a MDI.  Or some alternative besides dry powder.   Past Medical History:  Diagnosis Date  . Anxiety   . Atherosclerosis   . Colon polyp, hyperplastic   . COPD (chronic obstructive pulmonary disease) ([place])   . Diverticulosis   . Gallstone   . Pneumonia      Family History  Problem Relation Age of Onset  . Arthritis Mother   . COPD Mother   . Emphysema Mother   . Stroke Father   . Heart disease Father   . Cancer Father        MELANOMA  . Breast cancer Maternal Grandmother   . Colon cancer Neg Hx      Past Surgical History:  Procedure Laterality Date  . ABDOMINAL HYSTERECTOMY    . BRONCHIAL BIOPSY  11/03/2020   Procedure: BRONCHIAL BIOPSIES;  Surgeon: Garner Nash, DO;  Location: [facility];  Service: Pulmonary;;  . BRONCHIAL BRUSHINGS  11/03/2020   Procedure: BRONCHIAL BRUSHINGS;  Surgeon: Garner Nash, DO;  Location: [facility];  Service: Pulmonary;;  . BRONCHIAL NEEDLE ASPIRATION BIOPSY  11/03/2020   Procedure: BRONCHIAL NEEDLE ASPIRATION BIOPSIES;  Surgeon: Garner Nash, DO;  Location: [facility];  Service: Pulmonary;;  . BRONCHIAL WASHINGS  11/03/2020   Procedure: BRONCHIAL WASHINGS;  Surgeon: Garner Nash, DO;  Location: [facility];  Service: Pulmonary;;  . CESAREAN SECTION    . CHOLECYSTECTOMY    . COLONOSCOPY    . FIDUCIAL MARKER PLACEMENT  11/03/2020   Procedure: FIDUCIAL MARKER PLACEMENT;  Surgeon: Garner Nash, DO;  Location: [facility];  Service: Pulmonary;;  . VIDEO BRONCHOSCOPY WITH ENDOBRONCHIAL NAVIGATION N/A 11/03/2020    Procedure: VIDEO BRONCHOSCOPY WITH ENDOBRONCHIAL NAVIGATION AND POSSIBLE ULTRASOUND;  Surgeon: Garner Nash, DO;  Location: [facility];  Service: Pulmonary;  Laterality: N/A;  . VIDEO BRONCHOSCOPY WITH ENDOBRONCHIAL ULTRASOUND  11/03/2020   Procedure: VIDEO BRONCHOSCOPY WITH ENDOBRONCHIAL ULTRASOUND;  Surgeon: Garner Nash, DO;  Location: [facility];  Service: Pulmonary;;    Social History   Socioeconomic History  . Marital status: Divorced    Spouse name: Not on file  . Number of children: Not on file  . Years of education: Not on file  . Highest education level: Not on file  Occupational History  . Not on file  Tobacco Use  . Smoking status: Light Tobacco Smoker    Packs/day: 1.00    Years:  50.00    Pack years: 50.00    Types: Cigarettes    Last attempt to quit: 12/16/2019    Years since quitting: 1.0  . Smokeless tobacco: Never Used  . Tobacco comment: Last cigarette a week ago  Substance and Sexual Activity  . Alcohol use: No    Alcohol/week: 0.0 standard drinks  . Drug use: No  . Sexual activity: Never    Comment: 1ST INTERCOURSE- 9, PARTNERS- 5  Other Topics Concern  . Not on file  Social History Narrative  . Not on file   Social Determinants of Health   Financial Resource Strain: Not on file  Food Insecurity: Not on file  Transportation Needs: Not on file  Physical Activity: Not on file  Stress: Not on file  Social Connections: Not on file  Intimate Partner Violence: Not on file     Allergies  Allergen Reactions  . Codeine Nausea And Vomiting  . Erythromycin Nausea And Vomiting  . Ampicillin Nausea And Vomiting, Rash and Other (See Comments)    Pt states that rash was on her tongue.  Has patient had a PCN reaction causing immediate rash, facial/tongue/throat swelling, SOB or lightheadedness with hypotension yes Has patient had a PCN reaction causing severe rash involving mucus membranes or skin necrosis: no Has patient had a PCN reaction that  required hospitalization yes Has patient had a PCN reaction occurring within the last 10 years: no If all of the above answers are "NO", then may proceed with Cephalosporin use.      Outpatient Medications Prior to Visit  Medication Sig Dispense Refill  . albuterol (PROVENTIL) (2.5 MG/3ML) 0.083% nebulizer solution Take 3 mLs (2.5 mg total) by nebulization every 4 (four) hours as needed for wheezing or shortness of breath. 75 mL 5  . ALPRAZolam (XANAX) 0.25 MG tablet Take 1 tablet (0.25 mg total) by mouth 2 (two) times daily as needed for anxiety. 60 tablet 3  . calcium-vitamin D 250-100 MG-UNIT tablet Take 1 tablet by mouth daily. 30 tablet 5  . Fluticasone-Umeclidin-Vilant (TRELEGY ELLIPTA) 100-62.5-25 MCG/INH AEPB TAKE 1 PUFF BY MOUTH EVERY DAY (Patient taking differently: Inhale 1 puff into the lungs daily. TAKE 1 PUFF BY MOUTH EVERY DAY) 60 each 6  . VENTOLIN HFA 108 (90 Base) MCG/ACT inhaler INHALE 1-2 PUFFS INTO THE LUNGS EVERY 6 (SIX) HOURS AS NEEDED FOR WHEEZING OR SHORTNESS OF BREATH. 18 g 3   No facility-administered medications prior to visit.    Review of Systems  Constitutional: Negative for chills, fever, malaise/fatigue and weight loss.  HENT: Negative for hearing loss, sore throat and tinnitus.   Eyes: Negative for blurred vision and double vision.  Respiratory: Positive for shortness of breath and wheezing. Negative for cough, hemoptysis, sputum production and stridor.   Cardiovascular: Negative for chest pain, palpitations, orthopnea, leg swelling and PND.  Gastrointestinal: Negative for abdominal pain, constipation, diarrhea, heartburn, nausea and vomiting.  Genitourinary: Negative for dysuria, hematuria and urgency.  Musculoskeletal: Negative for joint pain and myalgias.  Skin: Negative for itching and rash.  Neurological: Negative for dizziness, tingling, weakness and headaches.  Endo/Heme/Allergies: Negative for environmental allergies. Does not bruise/bleed easily.   Psychiatric/Behavioral: Negative for depression. The patient is not nervous/anxious and does not have insomnia.   All other systems reviewed and are negative.    Objective:  Physical Exam Vitals reviewed.  Constitutional:      General: She is not in acute distress.    Appearance: She is well-developed.  HENT:  Head: Normocephalic and atraumatic.  Eyes:     General: No scleral icterus.    Conjunctiva/sclera: Conjunctivae normal.     Pupils: Pupils are equal, round, and reactive to light.  Neck:     Vascular: No JVD.     Trachea: No tracheal deviation.  Cardiovascular:     Rate and Rhythm: Normal rate and regular rhythm.     Heart sounds: Normal heart sounds. No murmur heard.   Pulmonary:     Effort: Pulmonary effort is normal. No tachypnea, accessory muscle usage or respiratory distress.     Breath sounds: No stridor. Wheezing present. No rhonchi or rales.     Comments: Diminished breath sounds bilaterally Abdominal:     General: Bowel sounds are normal. There is no distension.     Palpations: Abdomen is soft.     Tenderness: There is no abdominal tenderness.  Musculoskeletal:        General: No tenderness.     Cervical back: Neck supple.  Lymphadenopathy:     Cervical: No cervical adenopathy.  Skin:    General: Skin is warm and dry.     Capillary Refill: Capillary refill takes less than 2 seconds.     Findings: No rash.  Neurological:     Mental Status: She is alert and oriented to person, place, and time.  Psychiatric:        Behavior: Behavior normal.      Vitals:   01/18/21 0858  BP: 110/72  Pulse: 87  Temp: 98.2 F (36.8 C)  SpO2: 96%  Weight: 145 lb (65.8 kg)  Height: 5\' 2"  (1.575 m)   96% on RA BMI Readings from Last 3 Encounters:  01/18/21 26.52 kg/m  11/03/20 26.70 kg/m  10/02/20 26.70 kg/m   Wt Readings from Last 3 Encounters:  01/18/21 145 lb (65.8 kg)  11/03/20 146 lb (66.2 kg)  10/02/20 146 lb (66.2 kg)     CBC     Component Value Date/Time   WBC 7.2 08/10/2020 1403   RBC 4.43 08/10/2020 1403   HGB 13.4 08/10/2020 1403   HCT 39.8 08/10/2020 1403   PLT 373.0 08/10/2020 1403   MCV 89.8 08/10/2020 1403   MCH 30.8 11/22/2016 0832   MCHC 33.8 08/10/2020 1403   RDW 13.8 08/10/2020 1403   LYMPHSABS 2.8 08/10/2020 1403   MONOABS 0.4 08/10/2020 1403   EOSABS 0.2 08/10/2020 1403   BASOSABS 0.1 08/10/2020 1403    Chest Imaging: 07/24/2018 -CT chest with evidence of mild upper lobe predominant emphysematous change..  01/15/2021: CT chest stable left upper lobe nodule, 6 mm right middle lobe nodule. The patient's images have been independently reviewed by me.    Pulmonary Functions Testing Results: PFT Results Latest Ref Rng & Units 10/24/2018  FVC-Pre L 2.22  FVC-Predicted Pre % 74  FVC-Post L 2.15  FVC-Predicted Post % 72  Pre FEV1/FVC % % 52  Post FEV1/FCV % % 50  FEV1-Pre L 1.17  FEV1-Predicted Pre % 51  FEV1-Post L 1.08  DLCO uncorrected ml/min/mmHg 15.91  DLCO UNC% % 73  DLVA Predicted % 83  TLC L 6.19  TLC % Predicted % 130  RV % Predicted % 190    FeNO: None   Pathology:   Bronchscopy: LUL nodules, negative formalignancy   Echocardiogram: None   Heart Catheterization: None    Assessment & Plan:   Lung nodule - Plan: CT Chest Wo Contrast, predniSONE (DELTASONE) 10 MG tablet  Centrilobular emphysema (HCC) - Plan: CT  Chest Wo Contrast  Lung nodules - Plan: CT Chest Wo Contrast  Wheezing - Plan: predniSONE (DELTASONE) 10 MG tablet  COPD with acute exacerbation (HCC) - Plan: predniSONE (DELTASONE) 10 MG tablet  Discussion:  This is a 65 year old female, severe COPD, FEV1 of 1.08 L, 47% predicted on previous PFTs, abnormal lung cancer screening CT.  Here today for follow-up after bronchoscopy, tissue samples of navigational bronchoscopy negative for left upper lobe nodules.  Subsequent follow-up imaging in April 2022 shows stability of these nodules however new 6 mm nodule  within the right middle lobe.  Plan: Change of inhaler regimen today. Stop Trelegy Start breztri inhaler also triple therapy has an MDI Spacer, patient educated on spacer use and new inhaler technique. Continue albuterol for shortness of breath and wheezing. Encourage smoking cessation, discussed this again today in the office. Short course of prednisone as she is tight and wheezing today on exam. We will need to continue to follow her nodules closely. Repeat noncontrasted CT chest in 6 months. Return to clinic and see [place] in 6 months.   Current Outpatient Medications:  .  albuterol (PROVENTIL) (2.5 MG/3ML) 0.083% nebulizer solution, Take 3 mLs (2.5 mg total) by nebulization every 4 (four) hours as needed for wheezing or shortness of breath., Disp: 75 mL, Rfl: 5 .  ALPRAZolam (XANAX) 0.25 MG tablet, Take 1 tablet (0.25 mg total) by mouth 2 (two) times daily as needed for anxiety., Disp: 60 tablet, Rfl: 3 .  calcium-vitamin D 250-100 MG-UNIT tablet, Take 1 tablet by mouth daily., Disp: 30 tablet, Rfl: 5 .  Fluticasone-Umeclidin-Vilant (TRELEGY ELLIPTA) 100-62.5-25 MCG/INH AEPB, TAKE 1 PUFF BY MOUTH EVERY DAY (Patient taking differently: Inhale 1 puff into the lungs daily. TAKE 1 PUFF BY MOUTH EVERY DAY), Disp: 60 each, Rfl: 6 .  VENTOLIN HFA 108 (90 Base) MCG/ACT inhaler, INHALE 1-2 PUFFS INTO THE LUNGS EVERY 6 (SIX) HOURS AS NEEDED FOR WHEEZING OR SHORTNESS OF BREATH., Disp: 18 g, Rfl: 3   Garner Nash, DO [facility] 01/18/2021 9:08 AM

## 2021-01-18 NOTE — Patient Instructions (Addendum)
Thank you for visiting Dr. Valeta Harms at [facility]. Today we recommend the following:  Orders Placed This Encounter  Procedures  . CT Chest Wo Contrast   Meds ordered this encounter  Medications  . Budeson-Glycopyrrol-Formoterol (BREZTRI AEROSPHERE) 160-9-4.8 MCG/ACT AERO    Sig: Inhale 2 puffs into the lungs in the morning and at bedtime.    Dispense:  10.7 g    Refill:  6  . predniSONE (DELTASONE) 10 MG tablet    Sig: Take 2 tablets (20 mg total) by mouth daily with breakfast for 5 days.    Dispense:  10 tablet    Refill:  0   Spacer and samples of Breztri   Return in about 6 months (around 07/20/2021) for with APP or Dr. Valeta Harms.    Please do your part to reduce the spread of COVID-19.    Managing the Challenge of Quitting Smoking Quitting smoking is a physical and mental challenge. You will face cravings, withdrawal symptoms, and temptation. Before quitting, work with your health care provider to make a plan that can help you manage quitting. Preparation can help you quit and keep you from giving in. How to manage lifestyle changes Managing stress Stress can make you want to smoke, and wanting to smoke may cause stress. It is important to find ways to manage your stress. You might try some of the following:  Practice relaxation techniques. ? Breathe slowly and deeply, in through your nose and out through your mouth. ? Listen to music. ? Soak in a bath or take a shower. ? Imagine a peaceful place or vacation.  Get some support. ? Talk with family or friends about your stress. ? Join a support group. ? Talk with a counselor or therapist.  Get some physical activity. ? Go for a walk, run, or bike ride. ? Play a favorite sport. ? Practice yoga.   Medicines Talk with your health care provider about medicines that might help you deal with cravings and make quitting easier for you. Relationships Social situations can be difficult when you are quitting smoking. To  manage this, you can:  Avoid parties and other social situations where people might be smoking.  Avoid alcohol.  Leave right away if you have the urge to smoke.  Explain to your family and friends that you are quitting smoking. Ask for support and let them know you might be a bit grumpy.  Plan activities where smoking is not an option. General instructions Be aware that many people gain weight after they quit smoking. However, not everyone does. To keep from gaining weight, have a plan in place before you quit and stick to the plan after you quit. Your plan should include:  Having healthy snacks. When you have a craving, it may help to: ? Eat popcorn, carrots, celery, or other cut vegetables. ? Chew sugar-free gum.  Changing how you eat. ? Eat small portion sizes at meals. ? Eat 4-6 small meals throughout the day instead of 1-2 large meals a day. ? Be mindful when you eat. Do not watch television or do other things that might distract you as you eat.  Exercising regularly. ? Make time to exercise each day. If you do not have time for a long workout, do short bouts of exercise for 5-10 minutes several times a day. ? Do some form of strengthening exercise, such as weight lifting. ? Do some exercise that gets your heart beating and causes you to breathe deeply, such as walking  fast, running, swimming, or biking. This is very important.  Drinking plenty of water or other low-calorie or no-calorie drinks. Drink 6-8 glasses of water daily.   How to recognize withdrawal symptoms Your body and mind may experience discomfort as you try to get used to not having nicotine in your system. These effects are called withdrawal symptoms. They may include:  Feeling hungrier than normal.  Having trouble concentrating.  Feeling irritable or restless.  Having trouble sleeping.  Feeling depressed.  Craving a cigarette. To manage withdrawal symptoms:  Avoid places, people, and activities that  trigger your cravings.  Remember why you want to quit.  Get plenty of sleep.  Avoid coffee and other caffeinated drinks. These may worsen some of your symptoms. These symptoms may surprise you. But be assured that they are normal to have when quitting smoking. How to manage cravings Come up with a plan for how to deal with your cravings. The plan should include the following:  A definition of the specific situation you want to deal with.  An alternative action you will take.  A clear idea for how this action will help.  The name of someone who might help you with this. Cravings usually last for 5-10 minutes. Consider taking the following actions to help you with your plan to deal with cravings:  Keep your mouth busy. ? Chew sugar-free gum. ? Suck on hard candies or a straw. ? Brush your teeth.  Keep your hands and body busy. ? Change to a different activity right away. ? Squeeze or play with a ball. ? Do an activity or a hobby, such as making bead jewelry, practicing needlepoint, or working with wood. ? Mix up your normal routine. ? Take a short exercise break. Go for a quick walk or run up and down stairs.  Focus on doing something kind or helpful for someone else.  Call a friend or family member to talk during a craving.  Join a support group.  Contact a quitline. Where to find support To get help or find a support group:  Call the [place]'s Smoking Quitline: 1-800-QUIT NOW 870-730-7963)  Visit the website of the Substance Abuse and [place]: ktimeonline.com  Text QUIT to SmokefreeTXT: 010932 Where to find more information Visit these websites to find more information on quitting smoking:  [place]: www.smokefree.gov  American Lung Association: www.lung.org  American Cancer Society: www.cancer.org  Centers for Disease Control and Prevention: http://www.wolf.info/  American Heart Association:  www.heart.org Contact a health care provider if:  You want to change your plan for quitting.  The medicines you are taking are not helping.  Your eating feels out of control or you cannot sleep. Get help right away if:  You feel depressed or become very anxious. Summary  Quitting smoking is a physical and mental challenge. You will face cravings, withdrawal symptoms, and temptation to smoke again. Preparation can help you as you go through these challenges.  Try different techniques to manage stress, handle social situations, and prevent weight gain.  You can deal with cravings by keeping your mouth busy (such as by chewing gum), keeping your hands and body busy, calling family or friends, or contacting a quitline for people who want to quit smoking.  You can deal with withdrawal symptoms by avoiding places where people smoke, getting plenty of rest, and avoiding drinks with caffeine. This information is not intended to replace advice given to you by your health care provider. Make  sure you discuss any questions you have with your health care provider. Document Revised: 07/23/2019 Document Reviewed: 07/23/2019 Elsevier Patient Education  [place].

## 2021-03-27 ENCOUNTER — Other Ambulatory Visit: Payer: Self-pay | Admitting: Internal Medicine

## 2021-03-27 DIAGNOSIS — F419 Anxiety disorder, unspecified: Secondary | ICD-10-CM

## 2021-04-21 ENCOUNTER — Other Ambulatory Visit: Payer: Self-pay

## 2021-04-21 DIAGNOSIS — J441 Chronic obstructive pulmonary disease with (acute) exacerbation: Secondary | ICD-10-CM

## 2021-04-21 MED ORDER — VENTOLIN HFA 108 (90 BASE) MCG/ACT IN AERS: INHALATION_SPRAY | RESPIRATORY_TRACT | 3 refills | Status: DC

## 2021-04-25 ENCOUNTER — Ambulatory Visit (HOSPITAL_COMMUNITY)
Admission: EM | Admit: 2021-04-25 | Discharge: 2021-04-25 | Disposition: A | Discharge status: Home / Self Care | Payer: Medicare Other | Attending: Medical Oncology | Admitting: Medical Oncology

## 2021-04-25 ENCOUNTER — Ambulatory Visit (INDEPENDENT_AMBULATORY_CARE_PROVIDER_SITE_OTHER): Payer: Medicare Other

## 2021-04-25 ENCOUNTER — Encounter (HOSPITAL_COMMUNITY): Payer: Self-pay

## 2021-04-25 ENCOUNTER — Other Ambulatory Visit: Payer: Self-pay

## 2021-04-25 DIAGNOSIS — M79601 Pain in right arm: Secondary | ICD-10-CM | POA: Diagnosis not present

## 2021-04-25 DIAGNOSIS — M25511 Pain in right shoulder: Secondary | ICD-10-CM

## 2021-04-25 DIAGNOSIS — R0781 Pleurodynia: Secondary | ICD-10-CM | POA: Diagnosis not present

## 2021-04-25 DIAGNOSIS — R0782 Intercostal pain: Secondary | ICD-10-CM

## 2021-04-25 DIAGNOSIS — Z043 Encounter for examination and observation following other accident: Secondary | ICD-10-CM | POA: Diagnosis not present

## 2021-04-25 DIAGNOSIS — W19XXXA Unspecified fall, initial encounter: Secondary | ICD-10-CM

## 2021-04-25 MED ORDER — LIDOCAINE 5 % EX PTCH: 1.0000 | MEDICATED_PATCH | CUTANEOUS | 0 refills | Status: DC

## 2021-04-25 NOTE — Discharge Instructions (Addendum)
Make sure that you are taking a once daily women's calcium supplement along with 2,000 IU of vitamin D.

## 2021-04-25 NOTE — ED Triage Notes (Signed)
Pt reports falling yesterday after being pulled by a large dog, making pt airborne, who then fell frontward/on right side. Pt now reports right side/rib pain, right knee pain, soreness to right arm, bruising to left arm. States pain in ribs is worse with breathing. Denies being on blood thinners.

## 2021-04-25 NOTE — ED Provider Notes (Signed)
[facility]    CSN: [id number] Arrival date & time: 04/25/21  1311      History   Chief Complaint Chief Complaint  Patient presents with   Fall    HPI Lynn Kline is a 65 y.o. female.   HPI  Fall: Patient states that yesterday she was taking her dog out in the yard.  She states that they saw a bunny and took off.  She was holding the leash and flew forward and then down to the ground.  She states that she landed on some gravel around her rib area.  She did not hit her head or have loss of consciousness per patient.  She says that her rib cage both in the front and the back is her most painful symptom but she is also having some clavicle and shoulder pain.  She denies any hemoptysis and states that her chronic shortness of breath is about the same as normal.  No chest pains.  She notes that it does hurt to cough or to take deep breaths.  She has not taken anything for symptoms.  She is not on a blood thinner.  Past Medical History:  Diagnosis Date   Anxiety    Atherosclerosis    Colon polyp, hyperplastic    COPD (chronic obstructive pulmonary disease) ([employer])    Diverticulosis    Gallstone    Pneumonia     Patient Active Problem List   Diagnosis Date Noted   Nodule of upper lobe of left lung 11/03/2020   Lung nodule    Mass of upper lobe of left lung 09/07/2020   Coronary atherosclerosis due to calcified coronary lesion 08/13/2020   Atherosclerosis of aorta ([place]) 08/13/2020   Intercostal pain 08/10/2020   Acute hand eczema 09/23/2019   Gallstones 11/06/2014   Hyperlipidemia with target LDL less than 130 08/19/2014   Cervical cancer screening 08/19/2014   Dysphagia, pharyngoesophageal phase 08/12/2014   IBS (irritable bowel syndrome) 08/12/2014   Visit for screening mammogram 12/14/2011   Routine general medical examination at a health care facility 12/14/2011   COPD (chronic obstructive pulmonary disease) ([place]) 11/15/2011   Tobacco abuse 11/15/2011    Anxiety 11/15/2011    Past Surgical History:  Procedure Laterality Date   ABDOMINAL HYSTERECTOMY     BRONCHIAL BIOPSY  11/03/2020   Procedure: BRONCHIAL BIOPSIES;  Surgeon: Garner Nash, DO;  Location: [facility];  Service: Pulmonary;;   BRONCHIAL BRUSHINGS  11/03/2020   Procedure: BRONCHIAL BRUSHINGS;  Surgeon: Garner Nash, DO;  Location: [facility];  Service: Pulmonary;;   BRONCHIAL NEEDLE ASPIRATION BIOPSY  11/03/2020   Procedure: BRONCHIAL NEEDLE ASPIRATION BIOPSIES;  Surgeon: Garner Nash, DO;  Location: [facility];  Service: Pulmonary;;   BRONCHIAL WASHINGS  11/03/2020   Procedure: BRONCHIAL WASHINGS;  Surgeon: Garner Nash, DO;  Location: [facility];  Service: Pulmonary;;   CESAREAN SECTION     CHOLECYSTECTOMY     COLONOSCOPY     FIDUCIAL MARKER PLACEMENT  11/03/2020   Procedure: FIDUCIAL MARKER PLACEMENT;  Surgeon: Garner Nash, DO;  Location: [facility];  Service: Pulmonary;;   VIDEO BRONCHOSCOPY WITH ENDOBRONCHIAL NAVIGATION N/A 11/03/2020   Procedure: VIDEO BRONCHOSCOPY WITH ENDOBRONCHIAL NAVIGATION AND POSSIBLE ULTRASOUND;  Surgeon: Garner Nash, DO;  Location: [facility];  Service: Pulmonary;  Laterality: N/A;   VIDEO BRONCHOSCOPY WITH ENDOBRONCHIAL ULTRASOUND  11/03/2020   Procedure: VIDEO BRONCHOSCOPY WITH ENDOBRONCHIAL ULTRASOUND;  Surgeon: Garner Nash, DO;  Location: [facility];  Service:  Pulmonary;;    OB History     Gravida  1   Para  1   Term      Preterm      AB      Living  1      SAB      IAB      Ectopic      Multiple      Live Births               Home Medications    Prior to Admission medications   Medication Sig Start Date End Date Taking? Authorizing Provider  albuterol (PROVENTIL) (2.5 MG/3ML) 0.083% nebulizer solution Take 3 mLs (2.5 mg total) by nebulization every 4 (four) hours as needed for wheezing or shortness of breath. 10/27/20   Icard, Octavio Graves, DO  ALPRAZolam (XANAX) 0.25 MG  tablet TAKE 1 TABLET(0.25 MG) BY MOUTH TWICE DAILY AS NEEDED FOR ANXIETY 03/28/21   Janith Lima, MD  Budeson-Glycopyrrol-Formoterol (BREZTRI AEROSPHERE) 160-9-4.8 MCG/ACT AERO Inhale 2 puffs into the lungs in the morning and at bedtime. 01/18/21   Icard, Octavio Graves, DO  Budeson-Glycopyrrol-Formoterol (BREZTRI AEROSPHERE) 160-9-4.8 MCG/ACT AERO Inhale 2 puffs into the lungs in the morning and at bedtime. 01/18/21   Garner Nash, DO  calcium-vitamin D 250-100 MG-UNIT tablet Take 1 tablet by mouth daily. 10/24/18   Icard, Octavio Graves, DO  VENTOLIN HFA 108 (90 Base) MCG/ACT inhaler INHALE 1-2 PUFFS INTO THE LUNGS EVERY 6 (SIX) HOURS AS NEEDED FOR WHEEZING OR SHORTNESS OF BREATH. 04/21/21   Icard, Octavio Graves, DO    Family History Family History  Problem Relation Age of Onset   Arthritis Mother    COPD Mother    Emphysema Mother    Stroke Father    Heart disease Father    Cancer Father        MELANOMA   Breast cancer Maternal Grandmother    Colon cancer Neg Hx     Social History Social History   Tobacco Use   Smoking status: Light Smoker    Packs/day: 1.00    Years: 50.00    Pack years: 50.00    Types: Cigarettes    Last attempt to quit: 12/16/2019    Years since quitting: 1.3   Smokeless tobacco: Never   Tobacco comments:    Last cigarette a week ago  Substance Use Topics   Alcohol use: No    Alcohol/week: 0.0 standard drinks   Drug use: No     Allergies   Codeine, Erythromycin, and Ampicillin   Review of Systems Review of Systems  As stated above in HPI Physical Exam Triage Vital Signs ED Triage Vitals  Enc Vitals Group     BP 04/25/21 1351 139/79     Pulse Rate 04/25/21 1351 90     Resp 04/25/21 1351 18     Temp 04/25/21 1351 98.6 F (37 C)     Temp Source 04/25/21 1351 Oral     SpO2 04/25/21 1351 95 %     Weight --      Height --      Head Circumference --      Peak Flow --      Pain Score 04/25/21 1348 8     Pain Loc --      Pain Edu? --      Excl. in [place]?  --    No data found.  Updated Vital Signs BP 139/79 (BP Location: Left Arm)  Pulse 90   Temp 98.6 F (37 C) (Oral)   Resp 18   SpO2 95%   Physical Exam Vitals and nursing note reviewed.  Constitutional:      General: She is not in acute distress.    Appearance: Normal appearance. She is not toxic-appearing.  HENT:     Head: Normocephalic and atraumatic.     Nose: Nose normal.  Eyes:     Extraocular Movements: Extraocular movements intact.     Pupils: Pupils are equal, round, and reactive to light.  Cardiovascular:     Rate and Rhythm: Normal rate and regular rhythm.     Heart sounds: Normal heart sounds.  Pulmonary:     Effort: Pulmonary effort is normal.     Breath sounds: Normal breath sounds.  Musculoskeletal:        General: No deformity. Normal range of motion.     Cervical back: Normal range of motion and neck supple. No tenderness.     Comments: Tenderness to palpation of the right clavicle near sternum, of the right arm near shoulder and of the lower right ribcage   Skin:    Findings: Bruising (Bilateral arms and legs) present.  Neurological:     General: No focal deficit present.     Mental Status: She is alert and oriented to person, place, and time.     UC Treatments / Results  Labs (all labs ordered are listed, but only abnormal results are displayed) Labs Reviewed - No data to display  EKG   Radiology No results found.  Procedures Procedures (including critical care time)  Medications Ordered in UC Medications - No data to display  Initial Impression / Assessment and Plan / UC Course  I have reviewed the triage vital signs and the nursing notes.  Pertinent labs & imaging results that were available during my care of the patient were reviewed by me and considered in my medical decision making (see chart for details).     New.  We are going to obtain imaging of multiple areas to assess for fracture given the nature of her fall.   UPDATE:  X-ray of her shoulder is normal and does not show any abnormalities of her clavicle.  Her rib imaging does suggest a fracture of the seventh rib.  I am going to start her on lidocaine patches while she is awaiting orthopedics. Discussed deep breathes to avoid risk of pneumonia. Red flag signs and symptoms discussed.  Final Clinical Impressions(s) / UC Diagnoses   Final diagnoses:  None   Discharge Instructions   None    ED Prescriptions   None    PDMP not reviewed this encounter.   Hughie Closs, [place] 04/25/21 1507

## 2021-05-25 ENCOUNTER — Telehealth: Payer: Self-pay | Admitting: Internal Medicine

## 2021-05-25 NOTE — Chronic Care Management (AMB) (Signed)
  Chronic Care Management   Outreach Note  05/25/2021 Name: Lynn Kline MRN: [id number] DOB: 12-26-1955  Referred by: Janith Lima, MD Reason for referral : No chief complaint on file.   An unsuccessful telephone outreach was attempted today. The patient was referred to the pharmacist for assistance with care management and care coordination.   Follow Up Plan:   Lauretta Grill Upstream Scheduler

## 2021-06-01 ENCOUNTER — Telehealth: Payer: Self-pay | Admitting: Lab

## 2021-06-01 NOTE — Progress Notes (Signed)
  Chronic Care Management   Note  06/01/2021 Name: Lynn Kline MRN: [id number] DOB: 02-21-1956  Lynn Kline is a 65 y.o. year old female who is a primary care patient of Janith Lima, MD. I reached out to Lynn Kline by phone today in response to a referral sent by [name] PCP, Janith Lima, MD.   [name] was given information about Chronic Care Management services today including:  CCM service includes personalized support from designated clinical staff supervised by her physician, including individualized plan of care and coordination with other care providers 24/7 contact phone numbers for assistance for urgent and routine care needs. Service will only be billed when office clinical staff spend 20 minutes or more in a month to coordinate care. Only one practitioner may furnish and bill the service in a calendar month. The patient may stop CCM services at any time (effective at the end of the month) by phone call to the office staff.   Patient agreed to services and verbal consent obtained.   Follow up plan:   [facility]

## 2021-06-08 NOTE — Chronic Care Management (AMB) (Signed)
error 

## 2021-06-15 ENCOUNTER — Telehealth: Payer: Self-pay | Admitting: Pulmonary Disease

## 2021-06-15 NOTE — Telephone Encounter (Signed)
Call made to patient, confirmed DOB. Patient states the Judithann Sauger is now $140 she cannot afford that. I made her aware she will need to contact her insurance and find out what is on her formulary and what is affordable. She was a bit irritated that we asked her to call her insurance stating "that is your job". I made her aware we do rely on the patients to help with their care because we are unable to contact insurance companies all day regarding every medication we send in. Patient states she will call her insurance company and call us back. I asked if she has filled out patient assistance and she states she was denied because she has medicare. I made her aware to let us know what her insurance company says and be sure to call and see if we have samples once she is close to being done with her current supply. Voiced understanding.   Nothing further needed at this time.

## 2021-06-17 ENCOUNTER — Telehealth: Payer: Self-pay | Admitting: Pulmonary Disease

## 2021-06-17 NOTE — Telephone Encounter (Signed)
Left message for patient to call back  

## 2021-06-22 NOTE — Telephone Encounter (Signed)
Patient would like samples of Breztri. Patient phone number is [phone].

## 2021-06-23 ENCOUNTER — Other Ambulatory Visit: Payer: Self-pay | Admitting: *Deleted

## 2021-06-23 MED ORDER — BREZTRI AEROSPHERE 160-9-4.8 MCG/ACT IN AERO: 2.0000 | INHALATION_SPRAY | Freq: Two times a day (BID) | RESPIRATORY_TRACT | 3 refills | Status: DC

## 2021-06-23 MED ORDER — BREZTRI AEROSPHERE 160-9-4.8 MCG/ACT IN AERO: 2.0000 | INHALATION_SPRAY | Freq: Two times a day (BID) | RESPIRATORY_TRACT | 0 refills | Status: DC

## 2021-06-23 NOTE — Telephone Encounter (Signed)
2 samples of the breztri has been left up front for the pt to pick these up.  I have called pt and LM on VM to make her aware and that she will need to contact the insurance to see what inhaler they will cover.

## 2021-06-25 ENCOUNTER — Telehealth: Payer: Self-pay | Admitting: Pulmonary Disease

## 2021-06-25 NOTE — Telephone Encounter (Signed)
Spoke with the pt  She needs rx for [facility] faxed to [facility]- first time rx with them so it can not be called in  Will hold until Monday 9/12 and print rx for DOD to sign since BI out of office next wk

## 2021-06-28 MED ORDER — BREZTRI AEROSPHERE 160-9-4.8 MCG/ACT IN AERO: 2.0000 | INHALATION_SPRAY | Freq: Two times a day (BID) | RESPIRATORY_TRACT | 3 refills | Status: DC

## 2021-06-28 NOTE — Telephone Encounter (Signed)
I have pt's CT order - no one has called her about scheduling it yet but I did pull order when I saw she had called.  I scheduled CT for 10/3 at 9:30. I have called pt & left her a vm to call me for info.

## 2021-06-28 NOTE — Telephone Encounter (Signed)
Lynn Kline has signed script and I have faxed to [facility]. I called and informed patient. She verbalized understanding and stated she is needing to make a f/u appt and needs to schedule 4moCT. She has not called back to schedule it. I have made a 619mo/u appt and will route to [facility] see if they can get her scheduled for Ct. Patient verbalized understanding.  PCCs, hey guys the patient missed call to schedule 19m219mo which is due in Oct 2022 and is wondering if she can get scheduled now. I have made a f/u appt with BI for 08/09/21 if we can get it done before then. Patient is aware it may not happen due to last minute. Thanks!

## 2021-06-28 NOTE — Telephone Encounter (Signed)
Rx printed and given to APP of the day- Sarah to sign with attached fax cover sheet to [facility].

## 2021-06-29 NOTE — Telephone Encounter (Signed)
Spoke to pt and gave her appt info.  Nothing further needed.

## 2021-07-01 ENCOUNTER — Telehealth: Payer: Self-pay

## 2021-07-01 NOTE — Chronic Care Management (AMB) (Signed)
    Chronic Care Management Pharmacy Assistant   Name: Lynn Kline  MRN: [id number] DOB: 1956-05-12  Lynn Kline is an 65 y.o. year old female who presents for her initial CCM visit with the clinical pharmacist.  Recent office visits:  N/A  Recent consult visits:  01/18/21 June Leap DO Pulmonology- Pt was seen for lung nodule. An CT scan was performed and pt started Budeson- Glycopyrrol 160-9-4.8 mcg and Prednisone 20 mg. Follow up in 6 months.  Hospital visits:  Medication Reconciliation was completed by comparing discharge summary, patient's EMR and Pharmacy list, and upon discussion with patient.  Admitted to the hospital on 04/25/21 due to a fall. Discharge date was 04/25/21. Discharged from [facility]?Medications Started at [facility] Discharge:?? -started Lidocaine 5 %  Medication Changes at Hospital Discharge: -Changed none  Medications Discontinued at Hospital Discharge: -Stopped none  Medications that remain the same after Hospital Discharge:??  -All other medications will remain the same.    Medications: Outpatient Encounter Medications as of 07/01/2021  Medication Sig   albuterol (PROVENTIL) (2.5 MG/3ML) 0.083% nebulizer solution Take 3 mLs (2.5 mg total) by nebulization every 4 (four) hours as needed for wheezing or shortness of breath.   ALPRAZolam (XANAX) 0.25 MG tablet TAKE 1 TABLET(0.25 MG) BY MOUTH TWICE DAILY AS NEEDED FOR ANXIETY   Budeson-Glycopyrrol-Formoterol (BREZTRI AEROSPHERE) 160-9-4.8 MCG/ACT AERO Inhale 2 puffs into the lungs in the morning and at bedtime.   Budeson-Glycopyrrol-Formoterol (BREZTRI AEROSPHERE) 160-9-4.8 MCG/ACT AERO Inhale 2 puffs into the lungs in the morning and at bedtime.   Budeson-Glycopyrrol-Formoterol (BREZTRI AEROSPHERE) 160-9-4.8 MCG/ACT AERO Inhale 2 puffs into the lungs in the morning and at bedtime.   calcium-vitamin D 250-100 MG-UNIT tablet Take 1 tablet by mouth daily.   lidocaine (LIDODERM) 5 %  Place 1 patch onto the skin daily. Remove & Discard patch within 12 hours or as directed by MD   VENTOLIN HFA 108 (90 Base) MCG/ACT inhaler INHALE 1-2 PUFFS INTO THE LUNGS EVERY 6 (SIX) HOURS AS NEEDED FOR WHEEZING OR SHORTNESS OF BREATH.   No facility-administered encounter medications on file as of 07/01/2021.    Current Medication List  albuterol (2.5 MG/3ML) 0.083% nebulizer solution last filled 06/11/21 25 DS ALPRAZolam 0.25 MG tablet last filled 06/11/21 30 DS Budeson-Glycopyrrol 160-9-4.8 MCG/ACT AERO last filled 06/14/21 30 DS    [place] Clinical Pharmacist Assistant [phone]

## 2021-07-07 ENCOUNTER — Other Ambulatory Visit: Payer: Self-pay

## 2021-07-07 ENCOUNTER — Ambulatory Visit (INDEPENDENT_AMBULATORY_CARE_PROVIDER_SITE_OTHER): Payer: Medicare Other

## 2021-07-07 DIAGNOSIS — E785 Hyperlipidemia, unspecified: Secondary | ICD-10-CM

## 2021-07-07 DIAGNOSIS — J441 Chronic obstructive pulmonary disease with (acute) exacerbation: Secondary | ICD-10-CM

## 2021-07-07 DIAGNOSIS — Z720 Tobacco use: Secondary | ICD-10-CM

## 2021-07-07 DIAGNOSIS — F419 Anxiety disorder, unspecified: Secondary | ICD-10-CM

## 2021-07-07 NOTE — Patient Instructions (Signed)
Lynn Kline,  It was great to talk to you today!  Please call me with any questions or concerns.  Visit Information   PATIENT GOALS:   Goals Addressed             This Visit's Progress    Prevent Falls and Broken Bones-Osteopenia       Timeframe:  Long-Range Goal Priority:  High Start Date:  07/07/2021                          Expected End Date: 01/04/2022                      Follow Up Date 10/06/2021   - always use handrails on the stairs - always wear shoes or slippers with non-slip sole - get at least 10 minutes of activity every day - keep cell phone with me always - make an emergency alert plan in case I fall - pick up clutter from the floors - remove, or use a non-slip pad, with my throw rugs - wear low heeled or flat shoes with non-skid soles    Why is this important?   When you fall, there are 3 things that control if a bone breaks or not.  These are the fall itself, how hard and the direction that you fall and how fragile your bones are.  Preventing falls is very important for you because of fragile bones.       Track and Manage My Symptoms-COPD       Timeframe:  Long-Range Goal Priority:  High Start Date:  07/07/2021                          Expected End Date: 01/04/2022                      Follow Up Date 10/06/2021   - develop a rescue plan - eliminate symptom triggers at home - keep follow-up appointments    Why is this important?   Tracking your symptoms and other information about your health helps your doctor plan your care.  Write down the symptoms, the time of day, what you were doing and what medicine you are taking.  You will soon learn how to manage your symptoms.          Consent to CCM Services: Ms. Shumard was given information about Chronic Care Management services including:  CCM service includes personalized support from designated clinical staff supervised by her physician, including individualized plan of care and coordination  with other care providers 24/7 contact phone numbers for assistance for urgent and routine care needs. Service will only be billed when office clinical staff spend 20 minutes or more in a month to coordinate care. Only one practitioner may furnish and bill the service in a calendar month. The patient may stop CCM services at any time (effective at the end of the month) by phone call to the office staff. The patient will be responsible for cost sharing (co-pay) of up to 20% of the service fee (after annual deductible is met).  Patient agreed to services and verbal consent obtained.   Patient verbalizes understanding of instructions provided today and agrees to view in [place].   Telephone follow up appointment with care management team member scheduled for: 3 months The patient has been provided with contact information for the care management team and  has been advised to call with any health related questions or concerns.   Tomasa Blase, PharmD Clinical Pharmacist, [place]   CLINICAL CARE PLAN: Patient Care Plan: CCM Care Plan     Problem Identified: Hyperlipidemia, GERD, Anxiety, and Tobacco use   Priority: High  Onset Date: 07/07/2021     Long-Range Goal: Disease Management   Start Date: 07/07/2021  Expected End Date: 01/04/2022  This Visit's Progress: On track  Priority: High  Note:   Current Barriers:  Unable to independently monitor therapeutic efficacy Does not adhere to prescribed medication regimen  Pharmacist Clinical Goal(s):  Patient will verbalize ability to afford treatment regimen achieve adherence to monitoring guidelines and medication adherence to achieve therapeutic efficacy achieve improvement in smoking cessation as evidenced by tobacco cessation through collaboration with PharmD and provider.   Interventions: 1:1 collaboration with Janith Lima, MD regarding development and update of comprehensive plan of care as evidenced by provider  attestation and co-signature Inter-disciplinary care team collaboration (see longitudinal plan of care) Comprehensive medication review performed; medication list updated in electronic medical record  COPD (Goal: control symptoms and prevent exacerbations) -Controlled -Current treatment  Breztri Aerosphere 160-9-4.76mg/act - 2 puffs in the AM and 2 puffs in the PM Albuterol HFA 1055m/act  - 1-2 puffs every 6 hours as needed  Albuterol 0.083% nebulizer solution - 1 vial every 4 hours as needed - uses anywhere from 1-3 treatments per day depending on the day  -Medications previously tried: tuTunisiatrelegy,   -Gold Grade: Gold 3 (FEV1 30-49%) -Monitors pulse ox throughout the day - averages 97-98%, if she is exerting herself can at times decreased to 87-92% which increases back to normal levels after 1-2 minutes of rest  -Current COPD Classification:   -MMRC/CAT score: 3 -Pulmonary function testing: 10/24/2018 -Exacerbations requiring treatment in last 6 months: 0 -Patient reports consistent use of maintenance inhaler -Frequency of rescue inhaler use: 1-3 times daily depending on level of activity throughout the day  -Counseled on Proper inhaler technique; Benefits of consistent maintenance inhaler use When to use rescue inhaler Differences between maintenance and rescue inhalers -Recommended to continue current medication  Anxiety (Goal: Prevention of Anxiety Attacks) -Controlled -Current treatment: Alprazolam 0.2546m 1 tablet twice daily as needed  -Medications previously tried/failed: n/a -GAD7: 9 - patient notes that alprazolam is effective at relieving her anxiety  -Educated on Benefits of medication for symptom control Benefits of cognitive-behavioral therapy with or without medication -Recommended to continue current medication  Osteopenia (Goal Prevention of disease progression / fractures) -Not ideally controlled -Last DEXA Scan: 08/24/2018   T-Score right femoral neck:  -1.8  T-Score left femoral neck: -1.9  T-Score lumbar spine: -2.4  10-year probability of major osteoporotic fracture: 10.3%  10-year probability of hip fracture: 2.2% -Patient is not a candidate for pharmacologic treatment -Current treatment  Calcium-Vitamin D 250m20m0unit - 1 tablet by mouth daily - not currently taking -Medications previously tried: n/a   -Recommend [id number] units of vitamin D daily. Recommend 1200 mg of calcium daily from dietary and supplemental sources. -Counseled on diet and exercise extensively Recommended for patient ot increase calcium and vitamin D supplementation as above  Hyperlipidemia: (LDL goal < 130) -Controlled Lab Results  Component Value Date   LDLCALC 119 (H) 08/10/2020  -Current treatment: N/a - diet controlled  -Medications previously tried: atorvastatin   -Current dietary patterns: reports to baked/grilled proteins, avoid excessive fried food intake, good amount of fresh fruits and vegetables  -Current exercise  habits: limited due to COPD -Educated on Cholesterol goals;  Benefits of statin for ASCVD risk reduction; Importance of limiting foods high in cholesterol; -Counseled on diet and exercise extensively Recommended for repeat lipid panel with next PCP visit, should LDL be elevated from goal, recommend retrial of statin medication   Tobacco use (Goal Smoking Cessation) -Not ideally controlled -Previous quit attempts: 10/2020 - was able to quit for a few days, but started smoking again, currently smoking 5 cigarettes/ day at most, previously had been smoking as much as 1 ppd  -Current treatment  N/a -Patient smokes After 30 minutes of waking -Patient triggers include: finishing a meal and drinking coffee / after stressful situations  -On a scale of 1-10, reports MOTIVATION to quit is 5-7 -On a scale of 1-10, reports CONFIDENCE in quitting is 5-7 -Provided contact information for [place] Quit Line (1-800-QUIT-NOW) and encouraged patient to  reach out to this group for support. -Recommended for patient to set a quit date before next appointment, encouraged patient to realize the pros of smoking cessation and the positive impact it would have on her COPD, patient voiced understanding, will reach out with any questions or concerns - patient prefers not to start a medication for smoking cessation at this time   Patient Goals/Self-Care Activities Patient will:  - take medications as prescribed collaborate with provider on medication access solutions Set a smoking quit date   Follow Up Plan: Telephone follow up appointment with care management team member scheduled for: 3 months The patient has been provided with contact information for the care management team and has been advised to call with any health related questions or concerns.

## 2021-07-07 NOTE — Progress Notes (Signed)
Chronic Care Management Pharmacy Note  07/07/2021 Name:  TYRONE BALASH MRN:  [id number] DOB:  1956-03-07  Summary: - Patient reports that she is continuing to try to quit smoking - currently smoking 5 cigarettes a day at most -Notes that she has been approved for the [place] and Me program for her Judithann Sauger, is waiting on her first shipment -Reports to having some white phlegm / increased coughing recently / increased SOB - is not running a fever/ denies other symptoms  -feels that alprazolam is effective at controlling anxiety - only using around 1 time a day on work days -reports that she has not been taking calcium-vitamin D supplementation   Recommendations/Changes made from today's visit: -Recommending for patient to set a quit date for smoking, patient not interested in medication therapy to aid in smoking cessation at this time -Will reach out to PCP for prednisone prescription for recent increase in coughing/ SOB -Advised for patient to watch diet / limit high cholesterol food intake, repeat lipid panel with next PCP visit and retrial statin should LDL be elevated from goal -Recommending for patient to increase calcium-vitamin D intake to 1253m-1000units daily   Subjective: BKRISSY OREBAUGHis an 65y.o. year old female who is a primary patient of JJanith Lima MD.  The CCM team was consulted for assistance with disease management and care coordination needs.    Engaged with patient by telephone for initial visit in response to provider referral for pharmacy case management and/or care coordination services.   Consent to Services:  The patient was given the following information about Chronic Care Management services today, agreed to services, and gave verbal consent: 1. CCM service includes personalized support from designated clinical staff supervised by the primary care provider, including individualized plan of care and coordination with other care providers 2. 24/7 contact phone  numbers for assistance for urgent and routine care needs. 3. Service will only be billed when office clinical staff spend 20 minutes or more in a month to coordinate care. 4. Only one practitioner may furnish and bill the service in a calendar month. 5.The patient may stop CCM services at any time (effective at the end of the month) by phone call to the office staff. 6. The patient will be responsible for cost sharing (co-pay) of up to 20% of the service fee (after annual deductible is met). Patient agreed to services and consent obtained.  Patient Care Team: JJanith Lima MD as PCP - General (Internal Medicine) SDelice BisonDDarnelle Maffucci [facility] (Pharmacist)  Recent office visits:  10/01/21 - TScarlette CalicoMD (PCP) - f/u - atorvastatin discontinued, referred to home health - possible use of continuous oxygen    Recent consult visits:  01/18/21 BJune LeapDO Pulmonology- Pt was seen for lung nodule. An CT scan was performed - showed stable upper left lobe emphysema and pt started Budeson- Glycopyrrol-Formoterol 160-9-4.8 mcg and Prednisone 20 mg x 5 days. Follow up in 6 months.   Hospital visits:  Medication Reconciliation was completed by comparing discharge summary, patient's EMR and Pharmacy list, and upon discussion with patient.   Admitted to the hospital on 04/25/21 due to a fall while walking her dog. Discharge date was 04/25/21. [facility] -started Lidocaine 5 %   Medication Changes at Hospital Discharge: -Changed none   Medications Discontinued at Hospital Discharge: -Stopped none   [facility]  -All other medications will remain the same.    Objective:  Lab Results  Component Value Date   CREATININE 0.73 08/10/2020   BUN 15 08/10/2020   GFR 86.40 08/10/2020   GFRNONAA >60 11/22/2016   GFRAA >60 11/22/2016   NA 137 08/10/2020   K 3.6  08/10/2020   CALCIUM 8.9 08/10/2020   CO2 29 08/10/2020   GLUCOSE 100 (H) 08/10/2020    Lab Results  Component Value Date/Time   HGBA1C 5.8 08/19/2014 10:50 AM   HGBA1C 5.9 11/15/2011 11:35 AM   GFR 86.40 08/10/2020 02:03 PM   GFR 80.17 09/23/2019 04:10 PM    Last diabetic Eye exam:  No results found for: HMDIABEYEEXA  Last diabetic Foot exam:  No results found for: HMDIABFOOTEX   Lab Results  Component Value Date   CHOL 188 08/10/2020   HDL 51.70 08/10/2020   LDLCALC 119 (H) 08/10/2020   LDLDIRECT 141.0 11/15/2011   TRIG 86.0 08/10/2020   CHOLHDL 4 08/10/2020    Hepatic Function Latest Ref Rng & Units 08/10/2020 09/23/2019 07/18/2018  Total Protein 6.0 - 8.3 g/dL 6.6 6.8 7.2  Albumin 3.5 - 5.2 g/dL 4.0 4.1 4.3  AST 0 - 37 U/L '13 27 22  ' ALT 0 - 35 U/L 9 37(H) 23  Alk Phosphatase 39 - 117 U/L 84 93 61  Total Bilirubin 0.2 - 1.2 mg/dL 0.3 0.2 0.5  Bilirubin, Direct 0.0 - 0.3 mg/dL 0.1 0.0 -    Lab Results  Component Value Date/Time   TSH 1.22 08/10/2020 02:03 PM   TSH 1.24 09/23/2019 04:10 PM    CBC Latest Ref Rng & Units 08/10/2020 09/23/2019 07/18/2018  WBC 4.0 - 10.5 K/uL 7.2 7.1 8.3  Hemoglobin 12.0 - 15.0 g/dL 13.4 13.3 14.1  Hematocrit 36.0 - 46.0 % 39.8 39.7 41.8  Platelets 150.0 - 400.0 K/uL 373.0 345.0 331.0    No results found for: VD25OH  Clinical ASCVD: No  The 10-year ASCVD risk score (Arnett DK, et al., 2019) is: 12.7%   Values used to calculate the score:     Age: 65 years     Sex: Female     Is Non-Hispanic African American: No     Diabetic: No     Tobacco smoker: Yes     Systolic Blood Pressure: 818 mmHg     Is BP treated: No     HDL Cholesterol: 51.7 mg/dL     Total Cholesterol: 188 mg/dL    Depression screen [facility] 2/9 10/01/2020 09/23/2019  Decreased Interest 0 0  Down, Depressed, Hopeless 0 0  PHQ - 2 Score 0 0     Social History   Tobacco Use  Smoking Status Light Smoker   Packs/day: 1.00   Years: 50.00   Pack years: 50.00    Types: Cigarettes   Last attempt to quit: 12/16/2019   Years since quitting: 1.5  Smokeless Tobacco Never  Tobacco Comments   Last cigarette a week ago   BP Readings from Last 3 Encounters:  04/25/21 139/79  01/18/21 110/72  11/03/20 106/63   Pulse Readings from Last 3 Encounters:  04/25/21 90  01/18/21 87  11/03/20 80   Wt Readings from Last 3 Encounters:  01/18/21 145 lb (65.8 kg)  11/03/20 146 lb (66.2 kg)  10/02/20 146 lb (66.2 kg)   BMI Readings from Last 3 Encounters:  01/18/21 26.52 kg/m  11/03/20 26.70 kg/m  10/02/20 26.70 kg/m    Assessment/Interventions: Review of patient past medical  history, allergies, medications, health status, including review of consultants reports, laboratory and other test data, was performed as part of comprehensive evaluation and provision of chronic care management services.   SDOH:  (Social Determinants of Health) assessments and interventions performed: Yes  SDOH Screenings   Alcohol Screen: Not on file  Depression (PHQ2-9): Low Risk    PHQ-2 Score: 0  Financial Resource Strain: Low Risk    Difficulty of Paying Living Expenses: Not very hard  Food Insecurity: Not on file  Housing: Not on file  Physical Activity: Not on file  Social Connections: Not on file  Stress: Not on file  Tobacco Use: High Risk   Smoking Tobacco Use: Light Smoker   Smokeless Tobacco Use: Never  Transportation Needs: Not on file    [place]  Allergies  Allergen Reactions   Codeine Nausea And Vomiting   Erythromycin Nausea And Vomiting   Ampicillin Nausea And Vomiting, Rash and Other (See Comments)    Pt states that rash was on her tongue.  Has patient had a PCN reaction causing immediate rash, facial/tongue/throat swelling, SOB or lightheadedness with hypotension yes Has patient had a PCN reaction causing severe rash involving mucus membranes or skin necrosis: no Has patient had a PCN reaction that required hospitalization yes Has patient  had a PCN reaction occurring within the last 10 years: no If all of the above answers are "NO", then may proceed with Cephalosporin use.     Medications Reviewed Today     Reviewed by Tomasa Blase, [facility] (Pharmacist) on 07/07/21 at 1206  Med List Status: <None>   Medication Order Taking? Sig Documenting Provider Last Dose Status Informant  albuterol (PROVENTIL) (2.5 MG/3ML) 0.083% nebulizer solution [id number] Yes Take 3 mLs (2.5 mg total) by nebulization every 4 (four) hours as needed for wheezing or shortness of breath. Garner Nash, DO Taking Active Self  ALPRAZolam Duanne Moron) 0.25 MG tablet [id number] Yes TAKE 1 TABLET(0.25 MG) BY MOUTH TWICE DAILY AS NEEDED FOR ANXIETY Janith Lima, MD Taking Active   Budeson-Glycopyrrol-Formoterol (BREZTRI AEROSPHERE) 160-9-4.8 MCG/ACT AERO [id number] Yes Inhale 2 puffs into the lungs in the morning and at bedtime. Magdalen Spatz, NP Taking Active   Calcium Carbonate-Vitamin D (CALCIUM PLUS VITAMIN D PO) [id number] Yes Take 1 tablet by mouth daily. 1276m calcium and 1000 units vitamin D [provider]  Active   VENTOLIN HFA 108 (90 Base) MCG/ACT inhaler [id number] INHALE 1-2 PUFFS INTO THE LUNGS EVERY 6 (SIX) HOURS AS NEEDED FOR WHEEZING OR SHORTNESS OF BREATH. IGarner Nash DO Taking Active             Patient Active Problem List   Diagnosis Date Noted   Nodule of upper lobe of left lung 11/03/2020   Lung nodule    Mass of upper lobe of left lung 09/07/2020   Coronary atherosclerosis due to calcified coronary lesion 08/13/2020   Atherosclerosis of aorta ([place] 08/13/2020   Intercostal pain 08/10/2020   Acute hand eczema 09/23/2019   Gallstones 11/06/2014   Hyperlipidemia with target LDL less than 130 08/19/2014   Cervical cancer screening 08/19/2014   Dysphagia, pharyngoesophageal phase 08/12/2014   IBS (irritable bowel syndrome) 08/12/2014   Visit for screening mammogram 12/14/2011   Routine general medical examination  at a health care facility 12/14/2011   COPD (chronic obstructive pulmonary disease) ([place] 11/15/2011   Tobacco abuse 11/15/2011   Anxiety 11/15/2011    Immunization History  Administered Date(s) Administered  DTaP 06/14/2005   Fluad Quad(high Dose 65+) 07/04/2020   Influenza Whole 10/23/2011   Influenza,inj,Quad PF,6+ Mos 08/19/2014, 10/24/2018, 08/01/2019   PFIZER(Purple Top)SARS-COV-2 Vaccination 07/04/2020, 07/25/2020   Pneumococcal Conjugate-13 09/14/2005   Pneumococcal Polysaccharide-23 08/19/2014   Tdap 09/22/2013    Conditions to be addressed/monitored:  Hyperlipidemia, GERD, Anxiety, and Tobacco use  Care Plan : CCM Care Plan  Updates made by Tomasa Blase, [place] since 07/07/2021 12:00 AM     Problem: Hyperlipidemia, GERD, Anxiety, and Tobacco use   Priority: High  Onset Date: 07/07/2021     Long-Range Goal: Disease Management   Start Date: 07/07/2021  Expected End Date: 01/04/2022  This Visit's Progress: On track  Priority: High  Note:   Current Barriers:  Unable to independently monitor therapeutic efficacy Does not adhere to prescribed medication regimen  Pharmacist Clinical Goal(s):  Patient will verbalize ability to afford treatment regimen achieve adherence to monitoring guidelines and medication adherence to achieve therapeutic efficacy achieve improvement in smoking cessation as evidenced by tobacco cessation through collaboration with PharmD and provider.   Interventions: 1:1 collaboration with Janith Lima, MD regarding development and update of comprehensive plan of care as evidenced by provider attestation and co-signature Inter-disciplinary care team collaboration (see longitudinal plan of care) Comprehensive medication review performed; medication list updated in electronic medical record  COPD (Goal: control symptoms and prevent exacerbations) -Controlled -Current treatment  Breztri Aerosphere 160-9-4.8mg/act - 2 puffs in the AM and 2  puffs in the PM Albuterol HFA 1030m/act  - 1-2 puffs every 6 hours as needed  Albuterol 0.083% nebulizer solution - 1 vial every 4 hours as needed - uses anywhere from 1-3 treatments per day depending on the day  -Medications previously tried: tuTunisiatrelegy,   -Gold Grade: Gold 3 (FEV1 30-49%) -Monitors pulse ox throughout the day - averages 97-98%, if she is exerting herself can at times decreased to 87-92% which increases back to normal levels after 1-2 minutes of rest  -Current COPD Classification:   -MMRC/CAT score: 3 -Pulmonary function testing: 10/24/2018 -Exacerbations requiring treatment in last 6 months: 0 -Patient reports consistent use of maintenance inhaler -Frequency of rescue inhaler use: 1-3 times daily depending on level of activity throughout the day  -Counseled on [place] inhaler technique; Benefits of consistent maintenance inhaler use When to use rescue inhaler Differences between maintenance and rescue inhalers -Recommended to continue current medication  Anxiety (Goal: Prevention of Anxiety Attacks) -Controlled -Current treatment: Alprazolam 0.2538m 1 tablet twice daily as needed  -Medications previously tried/failed: n/a -GAD7: 9 - patient notes that alprazolam is effective at relieving her anxiety  -Educated on Benefits of medication for symptom control Benefits of cognitive-behavioral therapy with or without medication -Recommended to continue current medication  Osteopenia (Goal Prevention of disease progression / fractures) -Not ideally controlled -Last DEXA Scan: 08/24/2018   T-Score right femoral neck: -1.8  T-Score left femoral neck: -1.9  T-Score lumbar spine: -2.4  10-year probability of major osteoporotic fracture: 10.3%  10-year probability of hip fracture: 2.2% -Patient is not a candidate for pharmacologic treatment -Current treatment  Calcium-Vitamin D 250m26m0unit - 1 tablet by mouth daily - not currently taking -Medications previously  tried: n/a   -Recommend [phone] units of vitamin D daily. Recommend 1200 mg of calcium daily from dietary and supplemental sources. -Counseled on diet and exercise extensively Recommended for patient ot increase calcium and vitamin D supplementation as above  Hyperlipidemia: (LDL goal < 130) -Controlled Lab Results  Component Value Date  [place] 119 (H) 08/10/2020  -Current treatment: N/a - diet controlled  -Medications previously tried: atorvastatin   -Current dietary patterns: reports to baked/grilled proteins, avoid excessive fried food intake, good amount of fresh fruits and vegetables  -Current exercise habits: limited due to COPD -Educated on Cholesterol goals;  Benefits of statin for ASCVD risk reduction; Importance of limiting foods high in cholesterol; -Counseled on diet and exercise extensively Recommended for repeat lipid panel with next PCP visit, should LDL be elevated from goal, recommend retrial of statin medication   Tobacco use (Goal Smoking Cessation) -Not ideally controlled -Previous quit attempts: 10/2020 - was able to quit for a few days, but started smoking again, currently smoking 5 cigarettes/ day at most, previously had been smoking as much as 1 ppd  -Current treatment  N/a -Patient smokes After 30 minutes of waking -Patient triggers include: finishing a meal and drinking coffee / after stressful situations  -On a scale of 1-10, reports MOTIVATION to quit is 5-7 -On a scale of 1-10, reports CONFIDENCE in quitting is 5-7 -Provided contact information for [place] [facility]uit [facility] (1-800-QUIT-NOW) and encouraged patient to reach out to this group for support. -Recommended for patient to set a quit date before next appointment, encouraged patient to realize the pros of smoking cessation and the positive impact it would have on her COPD, patient voiced understanding, will reach out with any questions or concerns - patient prefers not to start a medication for smoking  cessation at this time   Patient Goals/Self-Care Activities Patient will:  - take medications as prescribed collaborate with provider on medication access solutions Set a smoking quit date   Follow Up Plan: Telephone follow up appointment with care management team member scheduled for: 3 months The patient has been provided with contact information for the care management team and has been advised to call with any health related questions or concerns.        Medication Assistance:  Breztri obtained through [facility] and Me medication assistance program.  Enrollment ends 10/16/2021  Patient's preferred pharmacy is:  [id number] [address], [place] - [address] [place] [address] Phone: [phone] Fax: [phone]   Uses pill box? No - able to manage without  Pt endorses 75-100% compliance  Care Plan and Follow Up Patient Decision:  Patient agrees to Care Plan and Follow-up.  Plan: Telephone follow up appointment with care management team member scheduled for:  3 months and The patient has been provided with contact information for the care management team and has been advised to call with any health related questions or concerns.   Tomasa Blase, PharmD Clinical Pharmacist, [place]

## 2021-07-14 ENCOUNTER — Ambulatory Visit (INDEPENDENT_AMBULATORY_CARE_PROVIDER_SITE_OTHER): Payer: Medicare Other | Admitting: Internal Medicine

## 2021-07-14 ENCOUNTER — Other Ambulatory Visit: Payer: Self-pay

## 2021-07-14 ENCOUNTER — Encounter: Payer: Self-pay | Admitting: Internal Medicine

## 2021-07-14 ENCOUNTER — Ambulatory Visit (INDEPENDENT_AMBULATORY_CARE_PROVIDER_SITE_OTHER): Payer: Medicare Other

## 2021-07-14 VITALS — BP 120/78 | HR 75 | Temp 98.2°F | Resp 20 | Ht 62.0 in | Wt 147.0 lb

## 2021-07-14 DIAGNOSIS — F419 Anxiety disorder, unspecified: Secondary | ICD-10-CM | POA: Diagnosis not present

## 2021-07-14 DIAGNOSIS — R0609 Other forms of dyspnea: Secondary | ICD-10-CM

## 2021-07-14 DIAGNOSIS — R911 Solitary pulmonary nodule: Secondary | ICD-10-CM | POA: Diagnosis not present

## 2021-07-14 DIAGNOSIS — R059 Cough, unspecified: Secondary | ICD-10-CM

## 2021-07-14 DIAGNOSIS — R0600 Dyspnea, unspecified: Secondary | ICD-10-CM

## 2021-07-14 DIAGNOSIS — Z23 Encounter for immunization: Secondary | ICD-10-CM | POA: Diagnosis not present

## 2021-07-14 DIAGNOSIS — J441 Chronic obstructive pulmonary disease with (acute) exacerbation: Secondary | ICD-10-CM | POA: Diagnosis not present

## 2021-07-14 DIAGNOSIS — R051 Acute cough: Secondary | ICD-10-CM | POA: Diagnosis not present

## 2021-07-14 DIAGNOSIS — J432 Centrilobular emphysema: Secondary | ICD-10-CM | POA: Diagnosis not present

## 2021-07-14 DIAGNOSIS — R0602 Shortness of breath: Secondary | ICD-10-CM | POA: Diagnosis not present

## 2021-07-14 MED ORDER — ALBUTEROL SULFATE (2.5 MG/3ML) 0.083% IN NEBU
2.5000 mg | INHALATION_SOLUTION | RESPIRATORY_TRACT | 5 refills | Status: DC | PRN
Start: 2021-07-14 — End: 2022-08-14

## 2021-07-14 MED ORDER — VENTOLIN HFA 108 (90 BASE) MCG/ACT IN AERS: INHALATION_SPRAY | RESPIRATORY_TRACT | 3 refills | Status: DC

## 2021-07-14 MED ORDER — ALPRAZOLAM 0.25 MG PO TABS: ORAL_TABLET | ORAL | 2 refills | Status: DC

## 2021-07-14 MED ORDER — PREDNISONE 50 MG PO TABS
50.0000 mg | ORAL_TABLET | Freq: Every day | ORAL | 0 refills | Status: DC
Start: 2021-07-14 — End: 2021-10-06

## 2021-07-14 NOTE — Progress Notes (Signed)
Subjective:  Patient ID: Lynn Kline, female    DOB: 1955-10-18  Age: 65 y.o. MRN: [id number]  CC: COPD and Cough  This visit occurred during the SARS-CoV-2 public health emergency.  Safety protocols were in place, including screening questions prior to the visit, additional usage of staff PPE, and extensive cleaning of exam room while observing appropriate contact time as indicated for disinfecting solutions.    HPI MERRIN MCVICKER presents for f/up -   She complains of a 2-week history of cough that is productive of white bubbles with shortness of breath and wheezing.  She denies chest pain, fever, chills, night sweats, diaphoresis, or edema.  Outpatient Medications Prior to Visit  Medication Sig Dispense Refill  . Budeson-Glycopyrrol-Formoterol (BREZTRI AEROSPHERE) 160-9-4.8 MCG/ACT AERO Inhale 2 puffs into the lungs in the morning and at bedtime. 32.1 g 3  . Calcium Carbonate-Vitamin D (CALCIUM PLUS VITAMIN D PO) Take 1 tablet by mouth daily. 1200mg  calcium and 1000 units vitamin D    . albuterol (PROVENTIL) (2.5 MG/3ML) 0.083% nebulizer solution Take 3 mLs (2.5 mg total) by nebulization every 4 (four) hours as needed for wheezing or shortness of breath. 75 mL 5  . ALPRAZolam (XANAX) 0.25 MG tablet TAKE 1 TABLET(0.25 MG) BY MOUTH TWICE DAILY AS NEEDED FOR ANXIETY 60 tablet 2  . VENTOLIN HFA 108 (90 Base) MCG/ACT inhaler INHALE 1-2 PUFFS INTO THE LUNGS EVERY 6 (SIX) HOURS AS NEEDED FOR WHEEZING OR SHORTNESS OF BREATH. 18 g 3   No facility-administered medications prior to visit.    ROS Review of Systems  Constitutional:  Negative for chills, diaphoresis, fatigue and fever.  HENT: Negative.  Negative for sore throat.   Eyes: Negative.   Respiratory:  Positive for cough, shortness of breath and wheezing. Negative for chest tightness.   Cardiovascular:  Negative for chest pain, palpitations and leg swelling.  Gastrointestinal:  Negative for abdominal pain, diarrhea, nausea and  vomiting.  Endocrine: Negative.   Genitourinary: Negative.  Negative for difficulty urinating.  Musculoskeletal: Negative.  Negative for arthralgias and myalgias.  Skin: Negative.   Neurological:  Negative for dizziness, weakness, light-headedness and headaches.  Hematological:  Negative for adenopathy. Does not bruise/bleed easily.  Psychiatric/Behavioral:  Negative for decreased concentration, dysphoric mood and sleep disturbance. The patient is nervous/anxious.    Objective:  BP 120/78 (BP Location: Left Arm, Patient Position: Sitting, Cuff Size: Large)   Pulse 75   Temp 98.2 F (36.8 C) (Oral)   Resp 20   Ht 5\' 2"  (1.575 m)   Wt 147 lb (66.7 kg)   SpO2 98%   BMI 26.89 kg/m   BP Readings from Last 3 Encounters:  07/14/21 120/78  04/25/21 139/79  01/18/21 110/72    Wt Readings from Last 3 Encounters:  07/14/21 147 lb (66.7 kg)  01/18/21 145 lb (65.8 kg)  11/03/20 146 lb (66.2 kg)    Physical Exam Vitals reviewed.  Constitutional:      Appearance: She is not ill-appearing.  HENT:     Nose: Nose normal.     Mouth/Throat:     Mouth: Mucous membranes are moist.  Eyes:     Conjunctiva/sclera: Conjunctivae normal.  Cardiovascular:     Rate and Rhythm: Normal rate and regular rhythm.     Heart sounds: No murmur heard. Pulmonary:     Effort: Pulmonary effort is normal.     Breath sounds: Examination of the right-upper field reveals decreased breath sounds and rhonchi. Examination of the left-upper  field reveals decreased breath sounds and rhonchi. Examination of the right-middle field reveals decreased breath sounds. Examination of the left-middle field reveals decreased breath sounds. Examination of the right-lower field reveals decreased breath sounds. Examination of the left-lower field reveals decreased breath sounds. Decreased breath sounds and rhonchi present. No wheezing or rales.  Musculoskeletal:     Cervical back: Neck supple.  Lymphadenopathy:     Cervical: No  cervical adenopathy.  Neurological:     Mental Status: She is alert.    Lab Results  Component Value Date   WBC 7.2 08/10/2020   HGB 13.4 08/10/2020   HCT 39.8 08/10/2020   PLT 373.0 08/10/2020   GLUCOSE 100 (H) 08/10/2020   CHOL 188 08/10/2020   TRIG 86.0 08/10/2020   HDL 51.70 08/10/2020   LDLDIRECT 141.0 11/15/2011   LDLCALC 119 (H) 08/10/2020   ALT 9 08/10/2020   AST 13 08/10/2020   NA 137 08/10/2020   K 3.6 08/10/2020   CL 103 08/10/2020   CREATININE 0.73 08/10/2020   BUN 15 08/10/2020   CO2 29 08/10/2020   TSH 1.22 08/10/2020   INR 1.0 08/10/2020   HGBA1C 5.8 08/19/2014    DG Ribs Unilateral W/Chest Right  Result Date: 04/25/2021 CLINICAL DATA:  Fall landing on right side. EXAM: RIGHT RIBS AND CHEST - 3+ VIEW COMPARISON:  11/03/2020 FINDINGS: Lungs are adequately inflated and otherwise clear. Minimal chronic change over the left apex. Cardiomediastinal silhouette is normal. Acute versus chronic right posterior seventh rib remainder the exam is unchanged. IMPRESSION: 1. No acute cardiopulmonary disease. 2. Acute versus chronic right posterior seventh rib fracture. Electronically Signed   By: Marin Olp M.D.   On: 04/25/2021 14:52   DG Shoulder Right  Result Date: 04/25/2021 CLINICAL DATA:  Fall, arm pain EXAM: RIGHT SHOULDER - 2+ VIEW COMPARISON:  None. FINDINGS: There is no evidence of fracture or dislocation. There is no evidence of arthropathy or other focal bone abnormality. Soft tissues are unremarkable. IMPRESSION: No fracture or dislocation of the right shoulder. Joint spaces are preserved. Electronically Signed   By: Eddie Candle M.D.   On: 04/25/2021 14:48   DG Chest 2 View  Result Date: 07/14/2021 CLINICAL DATA:  65 year old female with history of cough, wheezing and shortness of breath for the past 2 weeks. EXAM: CHEST - 2 VIEW COMPARISON:  Chest x-ray 04/25/2021. FINDINGS: Nodular architectural distortion in the left upper lobe near the apex, similar to  prior studies. Adjacent fiducial markers are noted. Lung volumes are normal. No consolidative airspace disease. No pleural effusions. No pneumothorax. No other pulmonary nodule or mass noted. Pulmonary vasculature and the cardiomediastinal silhouette are within normal limits. IMPRESSION: 1. No radiographic evidence of acute cardiopulmonary disease. 2. Left upper lobe pulmonary nodule appears similar to prior examinations. Electronically Signed   By: Vinnie Langton M.D.   On: 07/14/2021 09:02     Assessment & Plan:   Darcella was seen today for copd and cough.  Diagnoses and all orders for this visit:  Flu vaccine need -     Flu Vaccine QUAD High Dose(Fluad)  Anxiety -     ALPRAZolam (XANAX) 0.25 MG tablet; TAKE 1 TABLET(0.25 MG) BY MOUTH TWICE DAILY AS NEEDED FOR ANXIETY  Chronic obstructive pulmonary disease with acute exacerbation (HCC) -     albuterol (PROVENTIL) (2.5 MG/3ML) 0.083% nebulizer solution; Take 3 mLs (2.5 mg total) by nebulization every 4 (four) hours as needed for wheezing or shortness of breath. -  VENTOLIN HFA 108 (90 Base) MCG/ACT inhaler; INHALE 1-2 PUFFS INTO THE LUNGS EVERY 6 (SIX) HOURS AS NEEDED FOR WHEEZING OR SHORTNESS OF BREATH.  Centrilobular emphysema (HCC) -     albuterol (PROVENTIL) (2.5 MG/3ML) 0.083% nebulizer solution; Take 3 mLs (2.5 mg total) by nebulization every 4 (four) hours as needed for wheezing or shortness of breath.  DOE (dyspnea on exertion) -     albuterol (PROVENTIL) (2.5 MG/3ML) 0.083% nebulizer solution; Take 3 mLs (2.5 mg total) by nebulization every 4 (four) hours as needed for wheezing or shortness of breath.  Cough- Her chest x-ray is negative for infiltrate. -     DG Chest 2 View; Future  COPD with acute exacerbation ([place])- Her symptoms, exam, and chest x-ray are consistent with an acute exacerbation that does not have a bacterial component.  Will treat with a 5-day course of prednisone. -     predniSONE (DELTASONE) 50 MG tablet;  Take 1 tablet (50 mg total) by mouth daily with breakfast.  I am having Yousra L. Lerch start on predniSONE. I am also having her maintain her Breztri Aerosphere, Calcium Carbonate-Vitamin D (CALCIUM PLUS VITAMIN D PO), ALPRAZolam, albuterol, and Ventolin HFA.  Meds ordered this encounter  Medications  . ALPRAZolam (XANAX) 0.25 MG tablet    Sig: TAKE 1 TABLET(0.25 MG) BY MOUTH TWICE DAILY AS NEEDED FOR ANXIETY    Dispense:  60 tablet    Refill:  2  . albuterol (PROVENTIL) (2.5 MG/3ML) 0.083% nebulizer solution    Sig: Take 3 mLs (2.5 mg total) by nebulization every 4 (four) hours as needed for wheezing or shortness of breath.    Dispense:  75 mL    Refill:  5  . VENTOLIN HFA 108 (90 Base) MCG/ACT inhaler    Sig: INHALE 1-2 PUFFS INTO THE LUNGS EVERY 6 (SIX) HOURS AS NEEDED FOR WHEEZING OR SHORTNESS OF BREATH.    Dispense:  18 g    Refill:  3    Ventolin per Patient/insurance  . predniSONE (DELTASONE) 50 MG tablet    Sig: Take 1 tablet (50 mg total) by mouth daily with breakfast.    Dispense:  5 tablet    Refill:  0     Follow-up: Return if symptoms worsen or fail to improve.  Scarlette Calico, MD

## 2021-07-14 NOTE — Patient Instructions (Signed)

## 2021-07-16 DIAGNOSIS — J441 Chronic obstructive pulmonary disease with (acute) exacerbation: Secondary | ICD-10-CM

## 2021-07-16 DIAGNOSIS — E785 Hyperlipidemia, unspecified: Secondary | ICD-10-CM

## 2021-07-19 ENCOUNTER — Ambulatory Visit (INDEPENDENT_AMBULATORY_CARE_PROVIDER_SITE_OTHER)
Admission: RE | Admit: 2021-07-19 | Discharge: 2021-07-19 | Disposition: A | Discharge status: Home / Self Care | Payer: Medicare Other | Source: Ambulatory Visit | Attending: Pulmonary Disease | Admitting: Pulmonary Disease

## 2021-07-19 ENCOUNTER — Other Ambulatory Visit: Payer: Self-pay

## 2021-07-19 DIAGNOSIS — J432 Centrilobular emphysema: Secondary | ICD-10-CM | POA: Diagnosis not present

## 2021-07-19 DIAGNOSIS — J439 Emphysema, unspecified: Secondary | ICD-10-CM | POA: Diagnosis not present

## 2021-07-19 DIAGNOSIS — R911 Solitary pulmonary nodule: Secondary | ICD-10-CM | POA: Diagnosis not present

## 2021-07-19 DIAGNOSIS — I700 Atherosclerosis of aorta: Secondary | ICD-10-CM | POA: Diagnosis not present

## 2021-07-19 DIAGNOSIS — R918 Other nonspecific abnormal finding of lung field: Secondary | ICD-10-CM

## 2021-07-26 ENCOUNTER — Telehealth: Payer: Self-pay | Admitting: Pulmonary Disease

## 2021-07-26 MED ORDER — PREDNISONE 10 MG PO TABS: ORAL_TABLET | ORAL | 0 refills | Status: DC

## 2021-07-26 MED ORDER — DOXYCYCLINE HYCLATE 100 MG PO TABS: 100.0000 mg | ORAL_TABLET | Freq: Two times a day (BID) | ORAL | 0 refills | Status: DC

## 2021-07-26 NOTE — Telephone Encounter (Signed)
I have called and spoke with Lynn Kline  and she stated that her PCP gave her prednisone 2 weeks ago (50 mg x 5 days).  She stated that she was better then her symptoms started to come back.  She has a deep cough with yellow tint that is mostly from her nasal area.  Denies any fever.  She has been using the neb txs and her inhalers.  She stated that her breathing is not bad if she is resting but when she gets up to move around she gets winded.  BI please advise. Thanks  Allergies  Allergen Reactions   Codeine Nausea And Vomiting   Erythromycin Nausea And Vomiting   Ampicillin Nausea And Vomiting, Rash and Other (See Comments)    Lynn Kline states that rash was on her tongue.  Has patient had a PCN reaction causing immediate rash, facial/tongue/throat swelling, SOB or lightheadedness with hypotension yes Has patient had a PCN reaction causing severe rash involving mucus membranes or skin necrosis: no Has patient had a PCN reaction that required hospitalization yes Has patient had a PCN reaction occurring within the last 10 years: no If all of the above answers are "NO", then may proceed with Cephalosporin use.

## 2021-07-26 NOTE — Telephone Encounter (Signed)
Icard, Octavio Graves, DO  Elie Confer, CMA Caller: Unspecified (Today,  2:02 PM) Pred taper, 40mg  X4 days and decrease by 10mg  every 4  Doxycycline 100mg  BID X 7 days  Keep appt with me   Garner Nash, DO  [facility]  07/26/2021 4:04 PM     I have called the pt and she is aware of BI recs.  She is aware of meds that have been sent to the pharmacy.  She is aware to contact our office if she feels that she is not getting better.

## 2021-08-09 ENCOUNTER — Ambulatory Visit (INDEPENDENT_AMBULATORY_CARE_PROVIDER_SITE_OTHER): Payer: Medicare Other | Admitting: Pulmonary Disease

## 2021-08-09 ENCOUNTER — Encounter: Payer: Self-pay | Admitting: Pulmonary Disease

## 2021-08-09 ENCOUNTER — Other Ambulatory Visit: Payer: Self-pay

## 2021-08-09 VITALS — BP 128/76 | HR 92 | Temp 97.8°F | Ht 62.0 in | Wt 147.8 lb

## 2021-08-09 DIAGNOSIS — Z87891 Personal history of nicotine dependence: Secondary | ICD-10-CM | POA: Diagnosis not present

## 2021-08-09 DIAGNOSIS — R918 Other nonspecific abnormal finding of lung field: Secondary | ICD-10-CM

## 2021-08-09 DIAGNOSIS — J441 Chronic obstructive pulmonary disease with (acute) exacerbation: Secondary | ICD-10-CM | POA: Diagnosis not present

## 2021-08-09 DIAGNOSIS — J432 Centrilobular emphysema: Secondary | ICD-10-CM | POA: Diagnosis not present

## 2021-08-09 NOTE — Patient Instructions (Signed)
Thank you for visiting Dr. Valeta Harms at [facility]. Today we recommend the following:  Orders Placed This Encounter  Procedures   Ambulatory Referral for Lung Cancer Scre   Continue current inhaler regimen.  Follow up appt after LDCT  Return in about 1 year (around 08/09/2022), or if symptoms worsen or fail to improve, for with Eric Form, NP, or Dr. Valeta Harms.    Please do your part to reduce the spread of COVID-19.

## 2021-08-09 NOTE — Progress Notes (Signed)
Synopsis: Referred in November 2019 for emphysema by Janith Lima, MD  Subjective:   PATIENT ID: Lynn Kline GENDER: female DOB: 1955/10/21, MRN: [id number]  Chief Complaint  Patient presents with   Follow-up    Follow up on CT    PMH of tobacco abuse, smoked for 45 years, she recently had a CT chest with upper lobe emphysema. She recently found out that her 3 family members on her fathers side died of pulmonary fibrosis. Father with two brothers that died of lung cancer. Father died of melanoma. She is a current smoker, 5 cigs per day. She is really trying to stop. Never had any PFTs before. She gets really short of breath when she walks. If she takes her dog for a walk she will have to stop at the end of street and rest. She has chest tightness and usually can only due a few things for a few minutes and then she has to stop and sit and rest. She makes eye glasses for a living. She lives off her ventolin inhaler.  She has been using her albuterol inhaler 8-12 times per day.  She usually goes through 3-4 albuterol inhalers per month.  OV 10/24/2018: Patient has been doing well since her last visit.  She has been smoke-free for the past 2 days.  She had PFTs completed in today's office visit that revealed an FEV1 postbronchodilator of 1.08 L, 47% predicted an RV of 190%, DLCO 73%.  Patient still describes significant dyspnea on exertion.  She is having trouble getting in from her car to the house.  She does have daily cough and sputum production which is a little bit better.  She does think that the Trelegy has made a big difference with using it each day.  Overall she is a little bit anxious about the new diagnosis of COPD.  Patient denies chest pain or hemoptysis.  OV 02/26/2020: Patient here today for follow-up.  Last seen in our office by Wyn Quaker in April 2020 followed by June 2020 and again in October 2020.  Last office visit by Patricia Nettle in April 2021 telephone visit.  Per this  visit she had quit smoking in March 2021.  Managed for moderate COPD last saw me in January 2020.  She was treated for exacerbation at that time congratulated on quitting smoking.  Treated with prednisone and doxycycline.  Continued recommendations for Trelegy inhaler.  Here today for follow-up after recent exacerbation.  Again today feels short of breath.  She has had significant issues with exertion.  Unfortunately she has occasionally went back to smoke a few times.  But has not smoked in the past week or more.  She is still using her Trelegy but has occasionally not use this to see if she can get by without having an inhaler.  We talked about portance of staying on regular maintenance inhaler.  OV 10/02/2020: This is a 65 year old female here today for follow-up of COPD as well as a recent lung cancer screening CT. Lung cancer screening CT was completed on 09/25/2020. This found a new left upper lobe nodule that was not present previously. The largest diameter of this kind of irregular shaped lesion is 18.6 mm. There is a more central solid component of the lesion that is approximately 1 cm with irregular margins. Overall concerning for potential underlying malignancy. Also has association with severe centrilobular and paraseptal emphysema. From a respiratory standpoint she is still very dyspneic on exertion. Since we  have last seen each other she has had three exacerbations requiring steroids and antibiotics. During that time though she had resorted back to smoking again. However the past couple weeks she states that she has quit smoking again because she "just cannot" do it anymore.  OV 01/18/2021: Patient here today for follow-up after bronchoscopy.  Patient's lung nodules were all negative for malignancy.  Patient here today for follow-up after CT imaging that was completed on 01/15/2021.  Patient's left upper lobe nodules remain stable new small 6 mm nodule within the right middle lobe.  Unfortunately she  started smoking again in between the last time I saw her.  She was down to 3 to 5 cigarettes a day.  She states she has not had a cigarette in the past couple of days.  She does have shortness of breath and wheezing at this time.  Does not like use of dry powder inhaler.  She is been on Trelegy for some time.  Would like to consider movement to a MDI.  Or some alternative besides dry powder.  OV 08/09/2021: Here today for follow-up after recent CT scan of the chest for follow-up of lung nodules.  Recent CT scan of the chest document stability of the upper lobe inflammatory scarring.  Nodules have otherwise dissipated.  Doing well on her triple therapy inhaler regimen, Breztri.  She recently had an exacerbation was put on antibiotics steroids.  Her last dose of steroids is tomorrow.  She feels much better.  She quit smoking over a month ago.   Past Medical History:  Diagnosis Date   Anxiety    Atherosclerosis    Colon polyp, hyperplastic    COPD (chronic obstructive pulmonary disease) (HCC)    Diverticulosis    Gallstone    Pneumonia      Family History  Problem Relation Age of Onset   Arthritis Mother    COPD Mother    Emphysema Mother    Stroke Father    Heart disease Father    Cancer Father        MELANOMA   Breast cancer Maternal Grandmother    Colon cancer Neg Hx      Past Surgical History:  Procedure Laterality Date   ABDOMINAL HYSTERECTOMY     BRONCHIAL BIOPSY  11/03/2020   Procedure: BRONCHIAL BIOPSIES;  Surgeon: Garner Nash, DO;  Location: [facility];  Service: Pulmonary;;   BRONCHIAL BRUSHINGS  11/03/2020   Procedure: BRONCHIAL BRUSHINGS;  Surgeon: Garner Nash, DO;  Location: [facility];  Service: Pulmonary;;   BRONCHIAL NEEDLE ASPIRATION BIOPSY  11/03/2020   Procedure: BRONCHIAL NEEDLE ASPIRATION BIOPSIES;  Surgeon: Garner Nash, DO;  Location: [facility];  Service: Pulmonary;;   BRONCHIAL WASHINGS  11/03/2020   Procedure: BRONCHIAL WASHINGS;  Surgeon:  Garner Nash, DO;  Location: [facility];  Service: Pulmonary;;   CESAREAN SECTION     CHOLECYSTECTOMY     COLONOSCOPY     FIDUCIAL MARKER PLACEMENT  11/03/2020   Procedure: FIDUCIAL MARKER PLACEMENT;  Surgeon: Garner Nash, DO;  Location: [facility];  Service: Pulmonary;;   VIDEO BRONCHOSCOPY WITH ENDOBRONCHIAL NAVIGATION N/A 11/03/2020   Procedure: VIDEO BRONCHOSCOPY WITH ENDOBRONCHIAL NAVIGATION AND POSSIBLE ULTRASOUND;  Surgeon: Garner Nash, DO;  Location: [facility];  Service: Pulmonary;  Laterality: N/A;   VIDEO BRONCHOSCOPY WITH ENDOBRONCHIAL ULTRASOUND  11/03/2020   Procedure: VIDEO BRONCHOSCOPY WITH ENDOBRONCHIAL ULTRASOUND;  Surgeon: Garner Nash, DO;  Location: [facility];  Service: Pulmonary;;  Social History   Socioeconomic History   Marital status: Divorced    Spouse name: Not on file   Number of children: Not on file   Years of education: Not on file   Highest education level: Not on file  Occupational History   Not on file  Tobacco Use   Smoking status: Former    Types: Cigarettes   Smokeless tobacco: Never   Tobacco comments:    Last cigarette a week ago  Substance and Sexual Activity   Alcohol use: No    Alcohol/week: 0.0 standard drinks   Drug use: No   Sexual activity: Never    Comment: 1ST INTERCOURSE- 27, PARTNERS- 4  Other Topics Concern   Not on file  Social History Narrative   Not on file   Social Determinants of Health   Financial Resource Strain: Low Risk    Difficulty of Paying Living Expenses: Not very hard  Food Insecurity: Not on file  Transportation Needs: Not on file  Physical Activity: Not on file  Stress: Not on file  Social Connections: Not on file  Intimate Partner Violence: Not on file     Allergies  Allergen Reactions   Codeine Nausea And Vomiting   Erythromycin Nausea And Vomiting   Ampicillin Nausea And Vomiting, Rash and Other (See Comments)    Pt states that rash was on her tongue.  Has patient  had a PCN reaction causing immediate rash, facial/tongue/throat swelling, SOB or lightheadedness with hypotension yes Has patient had a PCN reaction causing severe rash involving mucus membranes or skin necrosis: no Has patient had a PCN reaction that required hospitalization yes Has patient had a PCN reaction occurring within the last 10 years: no If all of the above answers are "NO", then may proceed with Cephalosporin use.      Outpatient Medications Prior to Visit  Medication Sig Dispense Refill   albuterol (PROVENTIL) (2.5 MG/3ML) 0.083% nebulizer solution Take 3 mLs (2.5 mg total) by nebulization every 4 (four) hours as needed for wheezing or shortness of breath. 75 mL 5   ALPRAZolam (XANAX) 0.25 MG tablet TAKE 1 TABLET(0.25 MG) BY MOUTH TWICE DAILY AS NEEDED FOR ANXIETY 60 tablet 2   Budeson-Glycopyrrol-Formoterol (BREZTRI AEROSPHERE) 160-9-4.8 MCG/ACT AERO Inhale 2 puffs into the lungs in the morning and at bedtime. 32.1 g 3   Calcium Carbonate-Vitamin D (CALCIUM PLUS VITAMIN D PO) Take 1 tablet by mouth daily. 1200mg  calcium and 1000 units vitamin D     doxycycline (VIBRA-TABS) 100 MG tablet Take 1 tablet (100 mg total) by mouth 2 (two) times daily. 14 tablet 0   predniSONE (DELTASONE) 10 MG tablet 40 mg x 4 days, 30 mg x 4 days, 20 mg x 4 days, 10 mg x 4 days then stop 40 tablet 0   predniSONE (DELTASONE) 50 MG tablet Take 1 tablet (50 mg total) by mouth daily with breakfast. 5 tablet 0   VENTOLIN HFA 108 (90 Base) MCG/ACT inhaler INHALE 1-2 PUFFS INTO THE LUNGS EVERY 6 (SIX) HOURS AS NEEDED FOR WHEEZING OR SHORTNESS OF BREATH. 18 g 3   No facility-administered medications prior to visit.    Review of Systems  Constitutional:  Negative for chills, fever, malaise/fatigue and weight loss.  HENT:  Negative for hearing loss, sore throat and tinnitus.   Eyes:  Negative for blurred vision and double vision.  Respiratory:  Positive for cough and shortness of breath. Negative for  hemoptysis, sputum production, wheezing and stridor.   Cardiovascular:  Negative for chest pain, palpitations, orthopnea, leg swelling and PND.  Gastrointestinal:  Negative for abdominal pain, constipation, diarrhea, heartburn, nausea and vomiting.  Genitourinary:  Negative for dysuria, hematuria and urgency.  Musculoskeletal:  Negative for joint pain and myalgias.  Skin:  Negative for itching and rash.  Neurological:  Negative for dizziness, tingling, weakness and headaches.  Endo/Heme/Allergies:  Negative for environmental allergies. Does not bruise/bleed easily.  Psychiatric/Behavioral:  Negative for depression. The patient is not nervous/anxious and does not have insomnia.   All other systems reviewed and are negative.   Objective:  Physical Exam Vitals reviewed.  Constitutional:      General: She is not in acute distress.    Appearance: She is well-developed.  HENT:     Head: Normocephalic and atraumatic.  Eyes:     General: No scleral icterus.    Conjunctiva/sclera: Conjunctivae normal.     Pupils: Pupils are equal, round, and reactive to light.  Neck:     Vascular: No JVD.     Trachea: No tracheal deviation.  Cardiovascular:     Rate and Rhythm: Normal rate and regular rhythm.     Heart sounds: Normal heart sounds. No murmur heard. Pulmonary:     Effort: Pulmonary effort is normal. No tachypnea, accessory muscle usage or respiratory distress.     Breath sounds: No stridor. No wheezing, rhonchi or rales.     Comments: Diminished breath sounds bilaterally Abdominal:     General: Bowel sounds are normal. There is no distension.     Palpations: Abdomen is soft.     Tenderness: There is no abdominal tenderness.  Musculoskeletal:        General: No tenderness.     Cervical back: Neck supple.  Lymphadenopathy:     Cervical: No cervical adenopathy.  Skin:    General: Skin is warm and dry.     Capillary Refill: Capillary refill takes less than 2 seconds.     Findings: No  rash.  Neurological:     Mental Status: She is alert and oriented to person, place, and time.  Psychiatric:        Behavior: Behavior normal.     Vitals:   08/09/21 0916  BP: 128/76  Pulse: 92  Temp: 97.8 F (36.6 C)  TempSrc: Oral  SpO2: 95%  Weight: 147 lb 12.8 oz (67 kg)  Height: 5\' 2"  (1.575 m)   95% on RA BMI Readings from Last 3 Encounters:  08/09/21 27.03 kg/m  07/14/21 26.89 kg/m  01/18/21 26.52 kg/m   Wt Readings from Last 3 Encounters:  08/09/21 147 lb 12.8 oz (67 kg)  07/14/21 147 lb (66.7 kg)  01/18/21 145 lb (65.8 kg)     CBC    Component Value Date/Time   WBC 7.2 08/10/2020 1403   RBC 4.43 08/10/2020 1403   HGB 13.4 08/10/2020 1403   HCT 39.8 08/10/2020 1403   PLT 373.0 08/10/2020 1403   MCV 89.8 08/10/2020 1403   MCH 30.8 11/22/2016 0832   MCHC 33.8 08/10/2020 1403   RDW 13.8 08/10/2020 1403   LYMPHSABS 2.8 08/10/2020 1403   MONOABS 0.4 08/10/2020 1403   EOSABS 0.2 08/10/2020 1403   BASOSABS 0.1 08/10/2020 1403    Chest Imaging: 07/24/2018 -CT chest with evidence of mild upper lobe predominant emphysematous change..  01/15/2021: CT chest stable left upper lobe nodule, 6 mm right middle lobe nodule. The patient's images have been independently reviewed by me.    October 2022: CT scan of  the chest reveals stability of the left upper lobe scarring.  Lung nodules have somewhat dissipated. The patient's images have been independently reviewed by me.    Pulmonary Functions Testing Results: PFT Results Latest Ref Rng & Units 10/24/2018  FVC-Pre L 2.22  FVC-Predicted Pre % 74  FVC-Post L 2.15  FVC-Predicted Post % 72  Pre FEV1/FVC % % 52  Post FEV1/FCV % % 50  FEV1-Pre L 1.17  FEV1-Predicted Pre % 51  FEV1-Post L 1.08  DLCO uncorrected ml/min/mmHg 15.91  DLCO UNC% % 73  DLVA Predicted % 83  TLC L 6.19  TLC % Predicted % 130  RV % Predicted % 190    FeNO: None   Pathology:   Bronchscopy: LUL nodules, negative formalignancy    Echocardiogram: None   Heart Catheterization: None    Assessment & Plan:   Former smoker - Plan: Ambulatory Referral for Lung Cancer Scre  Centrilobular emphysema (HCC)  Lung nodules  COPD with acute exacerbation (HCC)  Discussion:  This is a 65 year old female, severe COPD, FEV1 of 1.08 L, 47% predicted, abnormal lung cancer screening CT was referred for evaluation of bronchoscopy.  Patient underwent navigational bronchoscopy with left upper lobe nodules that were negative for malignancy.  Follow-up CT imaging revealed resolution and dissipation of the nodules with areas of scarring left in the left upper lobe.  We reviewed patient CT imaging today in the office.  Plan: Continue triple therapy inhaler regimen for COPD Complete course of antibiotics and steroids that was prescribed by PCP.  For her recent COPD exacerbation. Continue smoking cessation. Congratulated on some quitting smoking 4 weeks ago. Recommend follow-up with [place] in 1 year or as needed. She needs to reenroll in our lung cancer screening program Low-dose lung cancer screening CT and 1 year October 2023 Orders placed.  Patient will follow-up with [place] then. If she has recurrent COPD exacerbations throughout this year would need to consider addition of azithromycin Monday Wednesday Friday.  We discussed this in the office today.    Current Outpatient Medications:    albuterol (PROVENTIL) (2.5 MG/3ML) 0.083% nebulizer solution, Take 3 mLs (2.5 mg total) by nebulization every 4 (four) hours as needed for wheezing or shortness of breath., Disp: 75 mL, Rfl: 5   ALPRAZolam (XANAX) 0.25 MG tablet, TAKE 1 TABLET(0.25 MG) BY MOUTH TWICE DAILY AS NEEDED FOR ANXIETY, Disp: 60 tablet, Rfl: 2   Budeson-Glycopyrrol-Formoterol (BREZTRI AEROSPHERE) 160-9-4.8 MCG/ACT AERO, Inhale 2 puffs into the lungs in the morning and at bedtime., Disp: 32.1 g, Rfl: 3   Calcium Carbonate-Vitamin D (CALCIUM PLUS VITAMIN D PO), Take 1 tablet by  mouth daily. 1200mg  calcium and 1000 units vitamin D, Disp: , Rfl:    doxycycline (VIBRA-TABS) 100 MG tablet, Take 1 tablet (100 mg total) by mouth 2 (two) times daily., Disp: 14 tablet, Rfl: 0   predniSONE (DELTASONE) 10 MG tablet, 40 mg x 4 days, 30 mg x 4 days, 20 mg x 4 days, 10 mg x 4 days then stop, Disp: 40 tablet, Rfl: 0   predniSONE (DELTASONE) 50 MG tablet, Take 1 tablet (50 mg total) by mouth daily with breakfast., Disp: 5 tablet, Rfl: 0   VENTOLIN HFA 108 (90 Base) MCG/ACT inhaler, INHALE 1-2 PUFFS INTO THE LUNGS EVERY 6 (SIX) HOURS AS NEEDED FOR WHEEZING OR SHORTNESS OF BREATH., Disp: 18 g, Rfl: 3    Garner Nash, DO [facility] 08/09/2021 9:39 AM

## 2021-08-12 ENCOUNTER — Telehealth: Payer: Self-pay | Admitting: Pulmonary Disease

## 2021-08-12 NOTE — Telephone Encounter (Signed)
Checked pt's Ventolin Rx and saw that PCP sent Rx to pharmacy 07/14/21 for pt with 3 additional refills. Called and spoke with pt letting her know this and she verbalized understanding. Nothing further needed.

## 2021-10-06 ENCOUNTER — Ambulatory Visit (INDEPENDENT_AMBULATORY_CARE_PROVIDER_SITE_OTHER): Payer: Medicare Other

## 2021-10-06 ENCOUNTER — Other Ambulatory Visit: Payer: Self-pay

## 2021-10-06 DIAGNOSIS — J431 Panlobular emphysema: Secondary | ICD-10-CM

## 2021-10-06 DIAGNOSIS — F419 Anxiety disorder, unspecified: Secondary | ICD-10-CM

## 2021-10-06 NOTE — Progress Notes (Signed)
Chronic Care Management Pharmacy Note  10/06/2021 Name:  Lynn Kline MRN:  [id number] DOB:  11-20-1955  Summary: -Patient reports that she has started to receive Breztri from [facility], has been re-approved from 2023 year - notes that she has been using as prescribed, feels that it has helped improve her breathing  -Patient quit smoking 06/2021 -Had COPD exacerbation 10/10 which she was prescribed doxycycline and prednisone for - reports that she is feeling much better, continues to have SOB / cough depending on level of exertion that she has at work (restarted 40 hours/week in June) -Continues to use alprazolam BID prn for anxiety - reports this is effective for control   Recommendations/Changes made from today's visit: -Recommending no changes to medications at this time, encouraged patient to communicate any issues with medications / changes to breathing / cough / phlegm with office / pulmonology  -Advised for patient to watch diet / limit high cholesterol food intake, repeat lipid panel with next PCP visit and retrial statin should LDL be elevated from goal   Subjective: Lynn Kline is an 65 y.o. year old female who is a primary patient of Janith Lima, MD.  The CCM team was consulted for assistance with disease management and care coordination needs.    Engaged with patient by telephone for initial visit in response to provider referral for pharmacy case management and/or care coordination services.   Consent to Services:  The patient was given the following information about Chronic Care Management services today, agreed to services, and gave verbal consent: 1. CCM service includes personalized support from designated clinical staff supervised by the primary care provider, including individualized plan of care and coordination with other care providers 2. 24/7 contact phone numbers for assistance for urgent and routine care needs. 3. Service will only be billed when office clinical  staff spend 20 minutes or more in a month to coordinate care. 4. Only one practitioner may furnish and bill the service in a calendar month. 5.The patient may stop CCM services at any time (effective at the end of the month) by phone call to the office staff. 6. The patient will be responsible for cost sharing (co-pay) of up to 20% of the service fee (after annual deductible is met). Patient agreed to services and consent obtained.  Patient Care Team: Janith Lima, MD as PCP - General (Internal Medicine) Delice Bison Darnelle Maffucci, [facility] as Pharmacist (Pharmacist)  Recent office visits:  07/14/2021 - Dr. Ronnald Ramp - 2 week history of cough  - 5 day prednisone prescription given    Recent consult visits:  08/09/2021 - Dr. Valeta Harms-  Pulmonology - stopped smoking about 1 month ago - doing well on breztri - to consider addition of azithromycin MWF should exacerbations continue - follow up in 1 year  07/26/2021 - Phone call to Pulmonology - COPD exacerbation doxycycline and prednisone taper     Hospital visits:  None since last visit   Objective:  Lab Results  Component Value Date   CREATININE 0.73 08/10/2020   BUN 15 08/10/2020   GFR 86.40 08/10/2020   GFRNONAA >60 11/22/2016   GFRAA >60 11/22/2016   NA 137 08/10/2020   K 3.6 08/10/2020   CALCIUM 8.9 08/10/2020   CO2 29 08/10/2020   GLUCOSE 100 (H) 08/10/2020    Lab Results  Component Value Date/Time   HGBA1C 5.8 08/19/2014 10:50 AM   HGBA1C 5.9 11/15/2011 11:35 AM   GFR 86.40 08/10/2020 02:03 PM   GFR  80.17 09/23/2019 04:10 PM    Last diabetic Eye exam:  No results found for: HMDIABEYEEXA  Last diabetic Foot exam:  No results found for: HMDIABFOOTEX   Lab Results  Component Value Date   CHOL 188 08/10/2020   HDL 51.70 08/10/2020   LDLCALC 119 (H) 08/10/2020   LDLDIRECT 141.0 11/15/2011   TRIG 86.0 08/10/2020   CHOLHDL 4 08/10/2020    Hepatic Function Latest Ref Rng & Units 08/10/2020 09/23/2019 07/18/2018  Total Protein 6.0 - 8.3  g/dL 6.6 6.8 7.2  Albumin 3.5 - 5.2 g/dL 4.0 4.1 4.3  AST 0 - 37 U/L '13 27 22  ' ALT 0 - 35 U/L 9 37(H) 23  Alk Phosphatase 39 - 117 U/L 84 93 61  Total Bilirubin 0.2 - 1.2 mg/dL 0.3 0.2 0.5  Bilirubin, Direct 0.0 - 0.3 mg/dL 0.1 0.0 -    Lab Results  Component Value Date/Time   TSH 1.22 08/10/2020 02:03 PM   TSH 1.24 09/23/2019 04:10 PM    CBC Latest Ref Rng & Units 08/10/2020 09/23/2019 07/18/2018  WBC 4.0 - 10.5 K/uL 7.2 7.1 8.3  Hemoglobin 12.0 - 15.0 g/dL 13.4 13.3 14.1  Hematocrit 36.0 - 46.0 % 39.8 39.7 41.8  Platelets 150.0 - 400.0 K/uL 373.0 345.0 331.0    No results found for: VD25OH  Clinical ASCVD: No  The 10-year ASCVD risk score (Arnett DK, et al., 2019) is: 6.2%   Values used to calculate the score:     Age: 89 years     Sex: Female     Is Non-Hispanic African American: No     Diabetic: No     Tobacco smoker: No     Systolic Blood Pressure: 974 mmHg     Is BP treated: No     HDL Cholesterol: 51.7 mg/dL     Total Cholesterol: 188 mg/dL    Depression screen [facility] 2/9 10/01/2020 09/23/2019  Decreased Interest 0 0  Down, Depressed, Hopeless 0 0  PHQ - 2 Score 0 0     Social History   Tobacco Use  Smoking Status Former   Types: Cigarettes  Smokeless Tobacco Never  Tobacco Comments   Last cigarette a week ago   BP Readings from Last 3 Encounters:  08/09/21 128/76  07/14/21 120/78  04/25/21 139/79   Pulse Readings from Last 3 Encounters:  08/09/21 92  07/14/21 75  04/25/21 90   Wt Readings from Last 3 Encounters:  08/09/21 147 lb 12.8 oz (67 kg)  07/14/21 147 lb (66.7 kg)  01/18/21 145 lb (65.8 kg)   BMI Readings from Last 3 Encounters:  08/09/21 27.03 kg/m  07/14/21 26.89 kg/m  01/18/21 26.52 kg/m    Assessment/Interventions: Review of patient past medical history, allergies, medications, health status, including review of consultants reports, laboratory and other test data, was performed as part of comprehensive evaluation and provision  of chronic care management services.   SDOH:  (Social Determinants of Health) assessments and interventions performed: Yes  SDOH Screenings   Alcohol Screen: Not on file  Depression (BUL8-4): Not on file  Financial Resource Strain: Low Risk    Difficulty of Paying Living Expenses: Not very hard  Food Insecurity: Not on file  Housing: Not on file  Physical Activity: Not on file  Social Connections: Not on file  Stress: Not on file  Tobacco Use: Medium Risk   Smoking Tobacco Use: Former   Smokeless Tobacco Use: Never   Passive Exposure: Not on [employer] consultant  Needs: Not on file    CCM Care Plan  Allergies  Allergen Reactions   Codeine Nausea And Vomiting   Erythromycin Nausea And Vomiting   Ampicillin Nausea And Vomiting, Rash and Other (See Comments)    Pt states that rash was on her tongue.  Has patient had a PCN reaction causing immediate rash, facial/tongue/throat swelling, SOB or lightheadedness with hypotension yes Has patient had a PCN reaction causing severe rash involving mucus membranes or skin necrosis: no Has patient had a PCN reaction that required hospitalization yes Has patient had a PCN reaction occurring within the last 10 years: no If all of the above answers are "NO", then may proceed with Cephalosporin use.     Medications Reviewed Today     Reviewed by Tomasa Blase, [facility] (Pharmacist) on 10/06/21 at 1537  Med List Status: <None>   Medication Order Taking? Sig Documenting Provider Last Dose Status Informant  albuterol (PROVENTIL) (2.5 MG/3ML) 0.083% nebulizer solution [id number] Yes Take 3 mLs (2.5 mg total) by nebulization every 4 (four) hours as needed for wheezing or shortness of breath. Janith Lima, MD Taking Active   ALPRAZolam Duanne Moron) 0.25 MG tablet [id number] Yes TAKE 1 TABLET(0.25 MG) BY MOUTH TWICE DAILY AS NEEDED FOR ANXIETY Janith Lima, MD Taking Active   Budeson-Glycopyrrol-Formoterol (BREZTRI AEROSPHERE) 160-9-4.8 MCG/ACT AERO  [id number] Yes Inhale 2 puffs into the lungs in the morning and at bedtime. Magdalen Spatz, NP Taking Active   Calcium Carbonate-Vitamin D (CALCIUM PLUS VITAMIN D PO) [id number] Yes Take 1 tablet by mouth daily. 1223m calcium and 1000 units vitamin D [provider] Taking Active   VENTOLIN HFA 108 (90 Base) MCG/ACT inhaler [id number] INHALE 1-2 PUFFS INTO THE LUNGS EVERY 6 (SIX) HOURS AS NEEDED FOR WHEEZING OR SHORTNESS OF BREATH. JJanith Lima MD Taking Active             Patient Active Problem List   Diagnosis Date Noted   Cough 07/14/2021   [place] 07/14/20[place]   Nodule of upper lobe of left lung 11/03/2020   Lung nodule    Mass of upper lobe of left lung 09/07/2020   Coronary atherosclerosis due to calcified coronary lesion 08/13/2020   Atherosclerosis of aorta ([place] 08/13/2020   Intercostal pain 08/10/2020   Acute hand eczema 09/23/2019   Gallstones 11/06/2014   Hyperlipidemia with target LDL less than 130 08/19/2014   Cervical cancer screening 08/19/2014   Dysphagia, pharyngoesophageal phase 08/12/2014   IBS (irritable bowel syndrome) 08/12/2014   Visit for screening mammogram 12/14/2011   Routine general medical examination at a health care facility 12/14/2011   COPD (chronic obstructive pulmonary disease) ([place] 11/15/2011   Tobacco abuse 11/15/2011   Anxiety 11/15/2011    Immunization History  Administered Date(s) Administered   DTaP 06/14/2005   Fluad Quad(high Dose 65+) 07/04/2020, 07/14/2021   Influenza Whole 10/23/2011   Influenza,inj,Quad PF,6+ Mos 08/19/2014, 10/24/2018, 08/01/2019   PFIZER(Purple Top)SARS-COV-2 Vaccination 07/04/2020, 07/25/2020   Pneumococcal Conjugate-13 09/14/2005   Pneumococcal Polysaccharide-23 08/19/2014   Tdap 09/22/2013    Conditions to be addressed/monitored:  Hyperlipidemia, GERD, Anxiety, and Tobacco use  Care Plan : CCM Care Plan  Updates made by STomasa Blase RManilasince 10/06/2021  12:00 AM     Problem: Hyperlipidemia, GERD, Anxiety, and Tobacco use   Priority: High  Onset Date: 07/07/2021     Long-Range Goal: Disease Management   Start Date: 07/07/2021  Expected End Date: 01/04/2022  Recent  Progress: On track  Priority: High  Note:   Current Barriers:  Unable to independently monitor therapeutic efficacy Does not adhere to prescribed medication regimen  Pharmacist Clinical Goal(s):  Patient will verbalize ability to afford treatment regimen achieve adherence to monitoring guidelines and medication adherence to achieve therapeutic efficacy achieve improvement in smoking cessation as evidenced by tobacco cessation through collaboration with PharmD and provider.   Interventions: 1:1 collaboration with Janith Lima, MD regarding development and update of comprehensive plan of care as evidenced by provider attestation and co-signature Inter-disciplinary care team collaboration (see longitudinal plan of care) Comprehensive medication review performed; medication list updated in electronic medical record  COPD (Goal: control symptoms and prevent exacerbations) -Controlled -Current treatment  Breztri Aerosphere 160-9-4.27mg/act - 2 puffs in the AM and 2 puffs in the PM Albuterol HFA 1023m/act  - 1-2 puffs every 6 hours as needed  Albuterol 0.083% nebulizer solution - 1 vial every 4 hours as needed - uses anywhere from 1-3 treatments per day depending on the day  -Medications previously tried: tuTunisiatrelegy,   -Gold Grade: Gold 3 (FEV1 30-49%) -Monitors pulse ox throughout the day - averages 97-98%, if she is exerting herself can at times decreased to 87-92% which increases back to normal levels after 1-2 minutes of rest  -Current COPD Classification:   -MMRC/CAT score: 3 -Pulmonary function testing: 10/24/2018 -Exacerbations requiring treatment in last 6 months: 1 (07/26/2021) -Patient reports consistent use of maintenance inhaler -Frequency of rescue  inhaler use: 1-3 times daily depending on level of activity throughout the day  -Counseled on Proper inhaler technique; Benefits of consistent maintenance inhaler use When to use rescue inhaler Differences between maintenance and rescue inhalers -Recommended to continue current medication  Anxiety (Goal: Prevention of Anxiety Attacks) -Controlled -Current treatment: Alprazolam 0.2574m 1 tablet twice daily as needed  -Medications previously tried/failed: n/a -GAD7: 9 - patient notes that alprazolam is effective at relieving her anxiety  -Educated on Benefits of medication for symptom control Benefits of cognitive-behavioral therapy with or without medication -Recommended to continue current medication  Osteopenia (Goal Prevention of disease progression / fractures) -Not ideally controlled -Last DEXA Scan: 08/24/2018   T-Score right femoral neck: -1.8  T-Score left femoral neck: -1.9  T-Score lumbar spine: -2.4  10-year probability of major osteoporotic fracture: 10.3%  10-year probability of hip fracture: 2.2% -Patient is not a candidate for pharmacologic treatment -Current treatment  Calcium-Vitamin D 250m65m0unit - 1 tablet by mouth daily - not currently taking -Medications previously tried: n/a   -Recommend [phone] units of vitamin D daily. Recommend 1200 mg of calcium daily from dietary and supplemental sources. -Counseled on diet and exercise extensively Recommended for patient ot increase calcium and vitamin D supplementation as above  Hyperlipidemia: (LDL goal < 130) -Controlled Lab Results  Component Value Date   LDLCALC 119 (H) 08/10/2020  -Current treatment: N/a - diet controlled  -Medications previously tried: atorvastatin   -Current dietary patterns: reports to baked/grilled proteins, avoid excessive fried food intake, good amount of fresh fruits and vegetables  -Current exercise habits: limited due to COPD -Educated on Cholesterol goals;  Benefits of statin  for ASCVD risk reduction; Importance of limiting foods high in cholesterol; -Counseled on diet and exercise extensively Recommended for repeat lipid panel with next PCP visit, should LDL be elevated from goal, recommend retrial of statin medication   Tobacco use (Goal Smoking Cessation) -Not ideally controlled -Previous quit attempts: 10/2020 - was able to quit for a few days, but  started smoking again, currently smoking 5 cigarettes/ day at most, previously had been smoking as much as 1 ppd  -Current treatment  N/a -Patient smokes After 30 minutes of waking -Patient triggers include: finishing a meal and drinking coffee / after stressful situations  -On a scale of 1-10, reports MOTIVATION to quit is 5-7 -On a scale of 1-10, reports CONFIDENCE in quitting is 5-7 -Provided contact information for [place] Quit Line (1-800-QUIT-NOW) and encouraged patient to reach out to this group for support. -Recommended for patient to set a quit date before next appointment, encouraged patient to realize the pros of smoking cessation and the positive impact it would have on her COPD, patient voiced understanding, will reach out with any questions or concerns - patient prefers not to start a medication for smoking cessation at this time   Patient Goals/Self-Care Activities Patient will:  - take medications as prescribed collaborate with provider on medication access solutions Set a smoking quit date   Follow Up Plan: Telephone follow up appointment with care management team member scheduled for: 3 months The patient has been provided with contact information for the care management team and has been advised to call with any health related questions or concerns.         Medication Assistance:  Breztri obtained through [facility] and Me medication assistance program.  Enrollment ends 10/16/2021  Patient's preferred pharmacy is:  [id number] [address], [place] - [address] [place] [address] Phone: [phone] Fax: [phone]    Uses pill box? No - able to manage without  Pt endorses 75-100% compliance  Care Plan and Follow Up Patient Decision:  Patient agrees to Care Plan and Follow-up.  Plan: Telephone follow up appointment with care management team member scheduled for:  3 months and The patient has been provided with contact information for the care management team and has been advised to call with any health related questions or concerns.   Tomasa Blase, PharmD Clinical Pharmacist, [place]

## 2021-10-06 NOTE — Patient Instructions (Signed)
Visit Information  Following are the goals we discussed today:   Track and Manage My Symptoms - COPD   Timeframe:  Long-Range Goal Priority:  High Start Date:  07/07/2021                          Expected End Date: 10/06/2022                      Follow Up Date 01/04/2022   - develop a rescue plan - eliminate symptom triggers at home - keep follow-up appointments    Why is this important?   Tracking your symptoms and other information about your health helps your doctor plan your care.  Write down the symptoms, the time of day, what you were doing and what medicine you are taking.  You will soon learn how to manage your symptoms.    Plan: Telephone follow up appointment with care management team member scheduled for:  3 months  The patient has been provided with contact information for the care management team and has been advised to call with any health related questions or concerns.   Tomasa Blase, PharmD Clinical Pharmacist, [facility]   Please call the care guide team at [phone] if you need to cancel or reschedule your appointment.   Patient verbalizes understanding of instructions provided today and agrees to view in [place].

## 2021-10-16 DIAGNOSIS — J431 Panlobular emphysema: Secondary | ICD-10-CM

## 2021-10-18 ENCOUNTER — Other Ambulatory Visit: Payer: Self-pay | Admitting: Internal Medicine

## 2021-10-18 DIAGNOSIS — F419 Anxiety disorder, unspecified: Secondary | ICD-10-CM

## 2021-12-08 ENCOUNTER — Other Ambulatory Visit: Payer: Self-pay | Admitting: Internal Medicine

## 2021-12-08 DIAGNOSIS — J441 Chronic obstructive pulmonary disease with (acute) exacerbation: Secondary | ICD-10-CM

## 2021-12-08 DIAGNOSIS — F419 Anxiety disorder, unspecified: Secondary | ICD-10-CM

## 2021-12-16 ENCOUNTER — Telehealth: Payer: Self-pay | Admitting: Pulmonary Disease

## 2021-12-16 MED ORDER — PREDNISONE 10 MG PO TABS: ORAL_TABLET | ORAL | 0 refills | Status: AC

## 2021-12-16 MED ORDER — DOXYCYCLINE HYCLATE 100 MG PO TABS: 100.0000 mg | ORAL_TABLET | Freq: Two times a day (BID) | ORAL | 0 refills | Status: AC

## 2021-12-16 NOTE — Telephone Encounter (Signed)
Pt feels as if she is having an excerbation. Is very SOB when she is up and moving around. Only wanted to see BI who doesnt have an opening until 3/9 and didnt want to wait that long. Would like something called into her pharmacy. Pharmacy is [facility] on [address]. LOV 08/09/21 ?

## 2021-12-16 NOTE — Telephone Encounter (Signed)
Called and spoke with patient. She verbalized understanding of recommendations. RXs have been sent to her pharmacy.  ? ?Nothing further needed at time of call.  ?

## 2021-12-16 NOTE — Telephone Encounter (Signed)
Gold sgate 2-3 copd with exacerbaon ? ?Plan ?Take doxycycline 100mg  po twice daily x 5 days; take after meals and avoid sunlight ? ?Please take prednisone 40 mg x1 day, then 30 mg x1 day, then 20 mg x1 day, then 10 mg x1 day, and then 5 mg x1 day and stop ? ? ?PFT Results Latest Ref Rng & Units 10/24/2018  ?FVC-Pre L 2.22  ?FVC-Predicted Pre % 74  ?FVC-Post L 2.15  ?FVC-Predicted Post % 72  ?Pre FEV1/FVC % % 52  ?Post FEV1/FCV % % 50  ?FEV1-Pre L 1.17  ?FEV1-Predicted Pre % 51  ?FEV1-Post L 1.08  ?DLCO uncorrected ml/min/mmHg 15.91  ?DLCO UNC% % 73  ?DLVA Predicted % 83  ?TLC L 6.19  ?TLC % Predicted % 130  ?RV % Predicted % 190  ? ? ? ? has a past medical history of Anxiety, Atherosclerosis, Colon polyp, hyperplastic, COPD (chronic obstructive pulmonary disease) ([place]), Diverticulosis, Gallstone, and Pneumonia. ? ? reports that she has quit smoking. Her smoking use included cigarettes. She has never used smokeless tobacco. ? ?Past Surgical History:  ?Procedure Laterality Date  ? ABDOMINAL HYSTERECTOMY    ? BRONCHIAL BIOPSY  11/03/2020  ? Procedure: BRONCHIAL BIOPSIES;  Surgeon: Garner Nash, DO;  Location: [facility];  Service: Pulmonary;;  ? BRONCHIAL BRUSHINGS  11/03/2020  ? Procedure: BRONCHIAL BRUSHINGS;  Surgeon: Garner Nash, DO;  Location: [facility];  Service: Pulmonary;;  ? BRONCHIAL NEEDLE ASPIRATION BIOPSY  11/03/2020  ? Procedure: BRONCHIAL NEEDLE ASPIRATION BIOPSIES;  Surgeon: Garner Nash, DO;  Location: [place];  Service: Pulmonary;;  ? BRONCHIAL WASHINGS  11/03/2020  ? Procedure: BRONCHIAL WASHINGS;  Surgeon: Garner Nash, DO;  Location: [facility];  Service: Pulmonary;;  ? CESAREAN SECTION    ? CHOLECYSTECTOMY    ? COLONOSCOPY    ? FIDUCIAL MARKER PLACEMENT  11/03/2020  ? Procedure: FIDUCIAL MARKER PLACEMENT;  Surgeon: Garner Nash, DO;  Location: [facility];  Service: Pulmonary;;  ? VIDEO BRONCHOSCOPY WITH ENDOBRONCHIAL NAVIGATION N/A 11/03/2020  ? Procedure: VIDEO  BRONCHOSCOPY WITH ENDOBRONCHIAL NAVIGATION AND POSSIBLE ULTRASOUND;  Surgeon: Garner Nash, DO;  Location: [place] [facility];  Service: Pulmonary;  Laterality: N/A;  ? VIDEO BRONCHOSCOPY WITH ENDOBRONCHIAL ULTRASOUND  11/03/2020  ? Procedure: VIDEO BRONCHOSCOPY WITH ENDOBRONCHIAL ULTRASOUND;  Surgeon: Garner Nash, DO;  Location: [facility];  Service: Pulmonary;;  ? ? ?Allergies  ?Allergen Reactions  ? Codeine Nausea And Vomiting  ? Erythromycin Nausea And Vomiting  ? Ampicillin Nausea And Vomiting, Rash and Other (See Comments)  ?  Pt states that rash was on her tongue.  ?Has patient had a PCN reaction causing immediate rash, facial/tongue/throat swelling, SOB or lightheadedness with hypotension yes ?Has patient had a PCN reaction causing severe rash involving mucus membranes or skin necrosis: no ?Has patient had a PCN reaction that required hospitalization yes ?Has patient had a PCN reaction occurring within the last 10 years: no ?If all of the above answers are "NO", then may proceed with Cephalosporin use. ?  ? ? ?Immunization History  ?Administered Date(s) Administered  ? DTaP 06/14/2005  ? Fluad Quad(high Dose 65+) 07/04/2020, 07/14/2021  ? Influenza Whole 10/23/2011  ? Influenza,inj,Quad PF,6+ Mos 08/19/2014, 10/24/2018, 08/01/2019  ? PFIZER(Purple Top)SARS-COV-2 Vaccination 07/04/2020, 07/25/2020  ? Pneumococcal Conjugate-13 09/14/2005  ? Pneumococcal Polysaccharide-23 08/19/2014  ? Tdap 09/22/2013  ? ? ?Family History  ?Problem Relation Age of Onset  ? Arthritis Mother   ? COPD Mother   ? Emphysema Mother   ?  Stroke Father   ? Heart disease Father   ? Cancer Father   ?     MELANOMA  ? Breast cancer Maternal Grandmother   ? Colon cancer Neg Hx   ? ? ? ?Current Outpatient Medications:  ?  albuterol (PROVENTIL) (2.5 MG/3ML) 0.083% nebulizer solution, Take 3 mLs (2.5 mg total) by nebulization every 4 (four) hours as needed for wheezing or shortness of breath., Disp: 75 mL, Rfl: 5 ?  ALPRAZolam (XANAX) 0.25  MG tablet, TAKE 1 TABLET(0.25 MG) BY MOUTH TWICE DAILY AS NEEDED FOR ANXIETY, Disp: 60 tablet, Rfl: 2 ?  Budeson-Glycopyrrol-Formoterol (BREZTRI AEROSPHERE) 160-9-4.8 MCG/ACT AERO, Inhale 2 puffs into the lungs in the morning and at bedtime., Disp: 32.1 g, Rfl: 3 ?  Calcium Carbonate-Vitamin D (CALCIUM PLUS VITAMIN D PO), Take 1 tablet by mouth daily. 1200mg  calcium and 1000 units vitamin D, Disp: , Rfl:  ?  VENTOLIN HFA 108 (90 Base) MCG/ACT inhaler, INHALE 1 TO 2 PUFFS INTO THE LUNGS EVERY 6 HOURS AS NEEDED FOR WHEEZING OR SHORTNESS OF BREATH, Disp: 18 g, Rfl: 3 ? ?

## 2021-12-16 NOTE — Telephone Encounter (Signed)
Spoke with pt who states increased SOB with exertion, dry cough and light wheezing. Pt denies fever/ chills/ GI upset. Pt states home Covid test are negative. Pt is using albuterol nebulizer 2 times a day, breztri as directed and albuterol inhaler >4 times a day. Dr. Chase Caller please advise as Dr. Valeta Harms is unavailable.  ?

## 2021-12-22 ENCOUNTER — Other Ambulatory Visit: Payer: Self-pay

## 2021-12-22 ENCOUNTER — Ambulatory Visit (INDEPENDENT_AMBULATORY_CARE_PROVIDER_SITE_OTHER): Payer: Medicare Other

## 2021-12-22 ENCOUNTER — Ambulatory Visit (INDEPENDENT_AMBULATORY_CARE_PROVIDER_SITE_OTHER): Payer: Medicare Other | Admitting: Internal Medicine

## 2021-12-22 VITALS — BP 120/76 | HR 91 | Temp 97.9°F | Ht 62.0 in | Wt 142.4 lb

## 2021-12-22 DIAGNOSIS — I700 Atherosclerosis of aorta: Secondary | ICD-10-CM

## 2021-12-22 DIAGNOSIS — R059 Cough, unspecified: Secondary | ICD-10-CM | POA: Diagnosis not present

## 2021-12-22 DIAGNOSIS — J449 Chronic obstructive pulmonary disease, unspecified: Secondary | ICD-10-CM | POA: Diagnosis not present

## 2021-12-22 DIAGNOSIS — R0602 Shortness of breath: Secondary | ICD-10-CM | POA: Diagnosis not present

## 2021-12-22 DIAGNOSIS — J441 Chronic obstructive pulmonary disease with (acute) exacerbation: Secondary | ICD-10-CM

## 2021-12-22 DIAGNOSIS — E785 Hyperlipidemia, unspecified: Secondary | ICD-10-CM

## 2021-12-22 DIAGNOSIS — R739 Hyperglycemia, unspecified: Secondary | ICD-10-CM | POA: Diagnosis not present

## 2021-12-22 DIAGNOSIS — E538 Deficiency of other specified B group vitamins: Secondary | ICD-10-CM | POA: Diagnosis not present

## 2021-12-22 DIAGNOSIS — E559 Vitamin D deficiency, unspecified: Secondary | ICD-10-CM

## 2021-12-22 DIAGNOSIS — Z23 Encounter for immunization: Secondary | ICD-10-CM

## 2021-12-22 DIAGNOSIS — Z0001 Encounter for general adult medical examination with abnormal findings: Secondary | ICD-10-CM

## 2021-12-22 LAB — LIPID PANEL
Cholesterol: 208 mg/dL — ABNORMAL HIGH (ref 0–200)
HDL: 80 mg/dL (ref >39.00)
LDL Cholesterol: 116 mg/dL — ABNORMAL HIGH (ref 0–99)
NonHDL: 128.26
Total CHOL/HDL Ratio: 3
Triglycerides: 59 mg/dL (ref 0.0–149.0)
VLDL: 11.8 mg/dL (ref 0.0–40.0)

## 2021-12-22 LAB — URINALYSIS, ROUTINE W REFLEX MICROSCOPIC
Bilirubin Urine: NEGATIVE
Hgb urine dipstick: NEGATIVE
Ketones, ur: NEGATIVE
Leukocytes,Ua: NEGATIVE
Nitrite: NEGATIVE
RBC / HPF: NONE SEEN (ref 0–?)
Specific Gravity, Urine: 1.01 (ref 1.000–1.030)
Total Protein, Urine: NEGATIVE
Urine Glucose: NEGATIVE
Urobilinogen, UA: 0.2 (ref 0.0–1.0)
pH: 6.5 (ref 5.0–8.0)

## 2021-12-22 LAB — VITAMIN B12: Vitamin B-12: 175 pg/mL — ABNORMAL LOW (ref 211–911)

## 2021-12-22 LAB — HEPATIC FUNCTION PANEL
ALT: 16 U/L (ref 0–35)
AST: 15 U/L (ref 0–37)
Albumin: 4.3 g/dL (ref 3.5–5.2)
Alkaline Phosphatase: 74 U/L (ref 39–117)
Bilirubin, Direct: 0.1 mg/dL (ref 0.0–0.3)
Total Bilirubin: 0.5 mg/dL (ref 0.2–1.2)
Total Protein: 6.8 g/dL (ref 6.0–8.3)

## 2021-12-22 LAB — CBC WITH DIFFERENTIAL/PLATELET
Basophils Absolute: 0.1 10*3/uL (ref 0.0–0.1)
Basophils Relative: 0.7 % (ref 0.0–3.0)
Eosinophils Absolute: 0 10*3/uL (ref 0.0–0.7)
Eosinophils Relative: 0.3 % (ref 0.0–5.0)
HCT: 42.8 % (ref 36.0–46.0)
Hemoglobin: 14.2 g/dL (ref 12.0–15.0)
Lymphocytes Relative: 26.7 % (ref 12.0–46.0)
Lymphs Abs: 2.5 10*3/uL (ref 0.7–4.0)
MCHC: 33.3 g/dL (ref 30.0–36.0)
MCV: 93 fl (ref 78.0–100.0)
Monocytes Absolute: 0.5 10*3/uL (ref 0.1–1.0)
Monocytes Relative: 5 % (ref 3.0–12.0)
Neutro Abs: 6.4 10*3/uL (ref 1.4–7.7)
Neutrophils Relative %: 67.3 % (ref 43.0–77.0)
Platelets: 306 10*3/uL (ref 150.0–400.0)
RBC: 4.6 Mil/uL (ref 3.87–5.11)
RDW: 13.9 % (ref 11.5–15.5)
WBC: 9.5 10*3/uL (ref 4.0–10.5)

## 2021-12-22 LAB — BASIC METABOLIC PANEL
BUN: 21 mg/dL (ref 6–23)
CO2: 32 mEq/L (ref 19–32)
Calcium: 9.3 mg/dL (ref 8.4–10.5)
Chloride: 100 mEq/L (ref 96–112)
Creatinine, Ser: 0.65 mg/dL (ref 0.40–1.20)
GFR: 91.62 mL/min (ref >60.00)
Glucose, Bld: 97 mg/dL (ref 70–99)
Potassium: 4 mEq/L (ref 3.5–5.1)
Sodium: 139 mEq/L (ref 135–145)

## 2021-12-22 LAB — VITAMIN D 25 HYDROXY (VIT D DEFICIENCY, FRACTURES): VITD: 14.03 ng/mL — ABNORMAL LOW (ref 30.00–100.00)

## 2021-12-22 LAB — HEMOGLOBIN A1C: Hgb A1c MFr Bld: 6.1 % (ref 4.6–6.5)

## 2021-12-22 LAB — TSH: TSH: 0.49 u[IU]/mL (ref 0.35–5.50)

## 2021-12-22 MED ORDER — HYDROCODONE BIT-HOMATROP MBR 5-1.5 MG/5ML PO SOLN: 5.0000 mL | Freq: Four times a day (QID) | ORAL | 0 refills | Status: AC | PRN

## 2021-12-22 MED ORDER — PREDNISONE 10 MG PO TABS: ORAL_TABLET | ORAL | 0 refills | Status: DC

## 2021-12-22 MED ORDER — LEVOFLOXACIN 500 MG PO TABS: 500.0000 mg | ORAL_TABLET | Freq: Every day | ORAL | 0 refills | Status: AC

## 2021-12-22 MED ORDER — METHYLPREDNISOLONE ACETATE 80 MG/ML IJ SUSP
80.0000 mg | Freq: Once | INTRAMUSCULAR | Status: AC
  Administered 2021-12-22: 80 mg via INTRAMUSCULAR

## 2021-12-22 NOTE — Patient Instructions (Signed)
You had the pneumovax and depomedrol (steroid) shots today ? ?Please take all new medication as prescribed - the antibiotic, cough medicine, prednisone ? ?Please continue all other medications as before, including the inhaler as needed ? ?Please have the pharmacy call with any other refills you may need. ? ?Please continue your efforts at being more active, low cholesterol diet, and weight control. ? ?You are otherwise up to date with prevention measures today. ? ?Please keep your appointments with your specialists as you may have planned = Pulmonary as you have planned ? ?Please go to the XRAY Department in the first floor for the x-ray testing ? ?Please go to the LAB at the blood drawing area for the tests to be done ? ?You will be contacted by phone if any changes need to be made immediately.  Otherwise, you will receive a letter about your results with an explanation, but please check with MyChart first. ? ?Please remember to sign up for MyChart if you have not done so, as this will be important to you in the future with finding out test results, communicating by private email, and scheduling acute appointments online when needed. ? ?Please make an Appointment to return in 6 months, or sooner if needed, to Dr Ronnald Ramp ? ? ? ?

## 2021-12-22 NOTE — Assessment & Plan Note (Signed)
Lab Results  ?Component Value Date  ? VITAMINB12 175 (L) 12/22/2021  ? ?Low, to start oral replacement - b12 1000 mcg qd ? ?

## 2021-12-22 NOTE — Progress Notes (Signed)
Patient ID: Lynn Kline, female   DOB: 18-Jul-1956, 66 y.o.   MRN: [id number]         Chief Complaint:: wellness exam and Shortness of Breath, chest congestion, and Wheezing  As PCP not available       HPI:  Lynn Kline is a 66 y.o. female here for wellness exam; decliens covid booster, shingrix, mammogram but ok for pneumovax, o/w up to date                        Also with hx of copd - Here with acute onset mild to mod 2-3 days ST, HA, general weakness and malaise, with prod cough greenish sputum, but Pt denies chest pain, increased sob or doe, wheezing, orthopnea, PND, increased LE swelling, palpitations, dizziness or syncope, except for mild wheezing since yesterday with sob/doe.   Pt denies polydipsia, polyuria, or new focal neuro s/s.  Pt denies fever, wt loss, night sweats, loss of appetite, or other constitutional symptoms  No other new compliants.     Wt Readings from Last 3 Encounters:  12/22/21 142 lb 6 oz (64.6 kg)  08/09/21 147 lb 12.8 oz (67 kg)  07/14/21 147 lb (66.7 kg)   BP Readings from Last 3 Encounters:  12/22/21 120/76  08/09/21 128/76  07/14/21 120/78   Immunization History  Administered Date(s) Administered   DTaP 06/14/2005   Fluad Quad(high Dose 65+) 07/04/2020, 07/14/2021   Influenza Whole 10/23/2011   Influenza,inj,Quad PF,6+ Mos 08/19/2014, 10/24/2018, 08/01/2019   PFIZER(Purple Top)SARS-COV-2 Vaccination 07/04/2020, 07/25/2020   Pneumococcal Conjugate-13 09/14/2005   Pneumococcal Polysaccharide-23 08/19/2014, 12/22/2021   Tdap 09/22/2013   There are no preventive care reminders to display for this patient.     Past Medical History:  Diagnosis Date   Anxiety    Atherosclerosis    Colon polyp, hyperplastic    COPD (chronic obstructive pulmonary disease) ([place])    Diverticulosis    Gallstone    Pneumonia    Past Surgical History:  Procedure Laterality Date   ABDOMINAL HYSTERECTOMY     BRONCHIAL BIOPSY  11/03/2020   Procedure: BRONCHIAL  BIOPSIES;  Surgeon: Garner Nash, DO;  Location: [facility];  Service: Pulmonary;;   BRONCHIAL BRUSHINGS  11/03/2020   Procedure: BRONCHIAL BRUSHINGS;  Surgeon: Garner Nash, DO;  Location: [facility];  Service: Pulmonary;;   BRONCHIAL NEEDLE ASPIRATION BIOPSY  11/03/2020   Procedure: BRONCHIAL NEEDLE ASPIRATION BIOPSIES;  Surgeon: Garner Nash, DO;  Location: [facility];  Service: Pulmonary;;   BRONCHIAL WASHINGS  11/03/2020   Procedure: BRONCHIAL WASHINGS;  Surgeon: Garner Nash, DO;  Location: [facility];  Service: Pulmonary;;   CESAREAN SECTION     CHOLECYSTECTOMY     COLONOSCOPY     FIDUCIAL MARKER PLACEMENT  11/03/2020   Procedure: FIDUCIAL MARKER PLACEMENT;  Surgeon: Garner Nash, DO;  Location: [facility];  Service: Pulmonary;;   VIDEO BRONCHOSCOPY WITH ENDOBRONCHIAL NAVIGATION N/A 11/03/2020   Procedure: VIDEO BRONCHOSCOPY WITH ENDOBRONCHIAL NAVIGATION AND POSSIBLE ULTRASOUND;  Surgeon: Garner Nash, DO;  Location: [facility];  Service: Pulmonary;  Laterality: N/A;   VIDEO BRONCHOSCOPY WITH ENDOBRONCHIAL ULTRASOUND  11/03/2020   Procedure: VIDEO BRONCHOSCOPY WITH ENDOBRONCHIAL ULTRASOUND;  Surgeon: Garner Nash, DO;  Location: [facility];  Service: Pulmonary;;    reports that she has quit smoking. Her smoking use included cigarettes. She has never used smokeless tobacco. She reports that she does not drink alcohol and does not use drugs.  family history includes Arthritis in her mother; Breast cancer in her maternal grandmother; COPD in her mother; Cancer in her father; Emphysema in her mother; Heart disease in her father; Stroke in her father. Allergies  Allergen Reactions   Codeine Nausea And Vomiting   Erythromycin Nausea And Vomiting   Ampicillin Nausea And Vomiting, Rash and Other (See Comments)    Pt states that rash was on her tongue.  Has patient had a PCN reaction causing immediate rash, facial/tongue/throat swelling, SOB or  lightheadedness with hypotension yes Has patient had a PCN reaction causing severe rash involving mucus membranes or skin necrosis: no Has patient had a PCN reaction that required hospitalization yes Has patient had a PCN reaction occurring within the last 10 years: no If all of the above answers are "NO", then may proceed with Cephalosporin use.    Current Outpatient Medications on File Prior to Visit  Medication Sig Dispense Refill   albuterol (PROVENTIL) (2.5 MG/3ML) 0.083% nebulizer solution Take 3 mLs (2.5 mg total) by nebulization every 4 (four) hours as needed for wheezing or shortness of breath. 75 mL 5   ALPRAZolam (XANAX) 0.25 MG tablet TAKE 1 TABLET(0.25 MG) BY MOUTH TWICE DAILY AS NEEDED FOR ANXIETY 60 tablet 2   Budeson-Glycopyrrol-Formoterol (BREZTRI AEROSPHERE) 160-9-4.8 MCG/ACT AERO Inhale 2 puffs into the lungs in the morning and at bedtime. 32.1 g 3   Calcium Carbonate-Vitamin D (CALCIUM PLUS VITAMIN D PO) Take 1 tablet by mouth daily. '1200mg'$  calcium and 1000 units vitamin D     VENTOLIN HFA 108 (90 Base) MCG/ACT inhaler INHALE 1 TO 2 PUFFS INTO THE LUNGS EVERY 6 HOURS AS NEEDED FOR WHEEZING OR SHORTNESS OF BREATH 18 g 3   No current facility-administered medications on file prior to visit.        ROS:  All others reviewed and negative.  Objective        PE:  BP 120/76    Pulse 91    Temp 97.9 F (36.6 C) (Oral)    Ht '5\' 2"'$  (1.575 m)    Wt 142 lb 6 oz (64.6 kg)    SpO2 92%    BMI 26.04 kg/m                 Constitutional: Pt appears in NAD, mild ill               HENT: Head: NCAT.                Right Ear: External ear normal.                 Left Ear: External ear normal. Bilat tm's with mild erythema.  Max sinus areas non tender.  Pharynx with mild erythema, no exudate               Eyes: . Pupils are equal, round, and reactive to light. Conjunctivae and EOM are normal               Nose: without d/c or deformity               Neck: Neck supple. Gross normal ROM                Cardiovascular: Normal rate and regular rhythm.                 Pulmonary/Chest: Effort normal and breath sounds without rales but mild bilat wheezing.  Abd:  Soft, NT, ND, + BS, no organomegaly               Neurological: Pt is alert. At baseline orientation, motor grossly intact               Skin: Skin is warm. No rashes, no other new lesions, LE edema - none               Psychiatric: Pt behavior is normal without agitation   Micro: none  Cardiac tracings I have personally interpreted today:  none  Pertinent Radiological findings (summarize): none   Lab Results  Component Value Date   WBC 9.5 12/22/2021   HGB 14.2 12/22/2021   HCT 42.8 12/22/2021   PLT 306.0 12/22/2021   GLUCOSE 97 12/22/2021   CHOL 208 (H) 12/22/2021   TRIG 59.0 12/22/2021   HDL 80.00 12/22/2021   LDLDIRECT 141.0 11/15/2011   LDLCALC 116 (H) 12/22/2021   ALT 16 12/22/2021   AST 15 12/22/2021   NA 139 12/22/2021   K 4.0 12/22/2021   CL 100 12/22/2021   CREATININE 0.65 12/22/2021   BUN 21 12/22/2021   CO2 32 12/22/2021   TSH 0.49 12/22/2021   INR 1.0 08/10/2020   HGBA1C 6.1 12/22/2021   Assessment/Plan:  CRYSTALEE VENTRESS is a 66 y.o. White or Caucasian [1] female with  has a past medical history of Anxiety, Atherosclerosis, Colon polyp, hyperplastic, COPD (chronic obstructive pulmonary disease) ([place]), Diverticulosis, Gallstone, and Pneumonia.  B12 deficiency Lab Results  Component Value Date   VITAMINB12 175 (L) 12/22/2021   Low, to start oral replacement - b12 1000 mcg qd   Vitamin D deficiency Last vitamin D Lab Results  Component Value Date   VD25OH 14.03 (L) 12/22/2021   Low, to start oral replacement   Encounter for well adult exam with abnormal findings Age and sex appropriate education and counseling updated with regular exercise and diet Referrals for preventative services - declines mammogram Immunizations addressed - for pneumovax, declines covid booster  and shingrix Smoking counseling  - none needed Evidence for depression or other mood disorder - none significant Most recent labs reviewed. I have personally reviewed and have noted: 1) the patient's medical and social history 2) The patient's current medications and supplements 3) The patient's height, weight, and BMI have been recorded in the chart   COPD with acute exacerbation ([place]) Mild to mod, for antibx course, cough med prn, prednisone, depomedrol im 80, also for cxr r/o pna,  to f/u any worsening symptoms or concerns  Hyperlipidemia with target LDL less than 130 Lab Results  Component Value Date   LDLCALC 116 (H) 12/22/2021   Mild elevated, goal ldl M 70 with known hx of aortic atherosclerosis, pt to continue current low chol diet, declines statin today given other issues   Atherosclerosis of aorta ([place]) Pt for low chol diet, exercise, and declines statin for now  Followup: Return in about 6 months (around 06/24/2022).  Cathlean Cower, MD 12/26/2021 11:56 AM [facility]

## 2021-12-22 NOTE — Assessment & Plan Note (Signed)
Last vitamin D ?Lab Results  ?Component Value Date  ? VD25OH 14.03 (L) 12/22/2021  ? ?Low, to start oral replacement ? ?

## 2021-12-26 ENCOUNTER — Encounter: Payer: Self-pay | Admitting: Internal Medicine

## 2021-12-26 NOTE — Assessment & Plan Note (Addendum)
Lab Results  ?Component Value Date  ? LDLCALC 116 (H) 12/22/2021  ? ?Mild elevated, goal ldl M 70 with known hx of aortic atherosclerosis, pt to continue current low chol diet, declines statin today given other issues ? ?

## 2021-12-26 NOTE — Assessment & Plan Note (Signed)
Pt for low chol diet, exercise, and declines statin for now ?

## 2021-12-26 NOTE — Assessment & Plan Note (Addendum)
Mild to mod, for antibx course, cough med prn, prednisone, depomedrol im 80, also for cxr r/o pna,  to f/u any worsening symptoms or concerns ?

## 2021-12-26 NOTE — Assessment & Plan Note (Signed)
Age and sex appropriate education and counseling updated with regular exercise and diet ?Referrals for preventative services - declines mammogram ?Immunizations addressed - for pneumovax, declines covid booster and shingrix ?Smoking counseling  - none needed ?Evidence for depression or other mood disorder - none significant ?Most recent labs reviewed. ?I have personally reviewed and have noted: ?1) the patient's medical and social history ?2) The patient's current medications and supplements ?3) The patient's height, weight, and BMI have been recorded in the chart ? ?

## 2022-01-06 ENCOUNTER — Ambulatory Visit (INDEPENDENT_AMBULATORY_CARE_PROVIDER_SITE_OTHER): Payer: Medicare Other

## 2022-01-06 DIAGNOSIS — E785 Hyperlipidemia, unspecified: Secondary | ICD-10-CM

## 2022-01-06 DIAGNOSIS — J431 Panlobular emphysema: Secondary | ICD-10-CM

## 2022-01-06 NOTE — Patient Instructions (Signed)
Visit Information ? ?Following are the goals we discussed today:  ? ? ?Monitor My Symptoms COPD  ? ?Timeframe:  Long-Range Goal ?Priority:  High ?Start Date:  07/07/2021                          ?Expected End Date: 10/06/2022                     ? ?Follow Up Date 06/2022 ?  ?- develop a rescue plan ?- eliminate symptom triggers at home ?- keep follow-up appointments  ?  ?Why is this important?   ?Tracking your symptoms and other information about your health helps your doctor plan your care.  ?Write down the symptoms, the time of day, what you were doing and what medicine you are taking.  ?You will soon learn how to manage your symptoms.   ? ?Plan: Telephone follow up appointment with care management team member scheduled for:  6 months ?The patient has been provided with contact information for the care management team and has been advised to call with any health related questions or concerns.  ? ?Tomasa Blase, PharmD ?Clinical Pharmacist, [place]  ? ?Please call the care guide team at [phone] if you need to cancel or reschedule your appointment.  ? ?Patient verbalizes understanding of instructions and care plan provided today and agrees to view in [place]. Active MyChart status confirmed with patient.   ? ? ?

## 2022-01-06 NOTE — Progress Notes (Signed)
? ?Chronic Care Management ?Pharmacy Note ? ?01/06/2022 ?Name:  Lynn Kline MRN:  [id number] DOB:  06-Feb-1956 ? ?Summary: ?- Patient reports that she has completed latest abx and prednisone taper for COPD exacerbation - feeling better, notes that she still has a cough - productive at times  ?-Patient reports that she feels breztri has been helpful for her breathing, notes that she can become SOB when moving around/ increasing activity levels  ?-Denies any issues with current medications  ? ?Recommendations/Changes made from today's visit: ?-Recommending for patient to trial guaifenesin BID to help with cough/ congestion ?-Patient to continue all other medications, encouraged patient to try using albuterol inhaler prior to periods of increased activity to prevent SOB / take breaks and rest as needed  ?-Patient to continue monitoring breathing, reach out to pulmonology with any issues or concerns  ? ? ?Subjective: ?Lynn Kline is an 66 y.o. year old female who is a primary patient of Lynn Lima, MD.  The CCM team was consulted for assistance with disease management and care coordination needs.   ? ?Engaged with patient by telephone for follow up visit in response to provider referral for pharmacy case management and/or care coordination services.  ? ?Consent to Services:  ?The patient was given the following information about Chronic Care Management services today, agreed to services, and gave verbal consent: 1. CCM service includes personalized support from designated clinical staff supervised by the primary care provider, including individualized plan of care and coordination with other care providers 2. 24/7 contact phone numbers for assistance for urgent and routine care needs. 3. Service will only be billed when office clinical staff spend 20 minutes or more in a month to coordinate care. 4. Only one practitioner may furnish and bill the service in a calendar month. 5.The patient may stop CCM services  at any time (effective at the end of the month) by phone call to the office staff. 6. The patient will be responsible for cost sharing (co-pay) of up to 20% of the service fee (after annual deductible is met). Patient agreed to services and consent obtained. ? ?Patient Care Team: ?Lynn Lima, MD as PCP - General (Internal Medicine) ?Tomasa Blase, [facility] as Pharmacist (Pharmacist) ? ?Recent office visits:  ?12/22/2021 - Dr. Jenny Reichmann - SOB/ chest congeston / wheezing - COPD exacerbation - depomedrol im 80, rx'd levofloxacin, prednisone, and hycodan cough syrup   ?  ?Recent consult visits:  ?12/16/2021 - Called Dr. Valeta Harms - COPD exacerbation - rx'd prednisone and doxycycline  ?  ?Hospital visits:  ?None since last visit  ? ?Objective: ? ?Lab Results  ?Component Value Date  ? CREATININE 0.65 12/22/2021  ? BUN 21 12/22/2021  ? GFR 91.62 12/22/2021  ? GFRNONAA >60 11/22/2016  ? GFRAA >60 11/22/2016  ? NA 139 12/22/2021  ? K 4.0 12/22/2021  ? CALCIUM 9.3 12/22/2021  ? CO2 32 12/22/2021  ? GLUCOSE 97 12/22/2021  ? ? ?Lab Results  ?Component Value Date/Time  ? HGBA1C 6.1 12/22/2021 03:00 PM  ? HGBA1C 5.8 08/19/2014 10:50 AM  ? GFR 91.62 12/22/2021 03:00 PM  ? GFR 86.40 08/10/2020 02:03 PM  ?  ?Last diabetic Eye exam:  ?No results found for: HMDIABEYEEXA  ?Last diabetic Foot exam:  ?No results found for: HMDIABFOOTEX  ? ?Lab Results  ?Component Value Date  ? CHOL 208 (H) 12/22/2021  ? HDL 80.00 12/22/2021  ? LDLCALC 116 (H) 12/22/2021  ? LDLDIRECT 141.0 11/15/2011  ? TRIG 59.0  12/22/2021  ? CHOLHDL 3 12/22/2021  ? ? ? ?  Latest Ref Rng & Units 12/22/2021  ?  3:00 PM 08/10/2020  ?  2:03 PM 09/23/2019  ?  4:10 PM  ?Hepatic Function  ?Total Protein 6.0 - 8.3 g/dL 6.8   6.6   6.8    ?Albumin 3.5 - 5.2 g/dL 4.3   4.0   4.1    ?AST 0 - 37 U/L _0 ?ALT 0 - 35 U/L 16   9   37    ?Alk Phosphatase 39 - 117 U/L 74   84   93    ?Total Bilirubin 0.2 - 1.2 mg/dL 0.5   0.3   0.2    ?Bilirubin, Direct 0.0 - 0.3 mg/dL 0.1   0.1   0.0     ? ? ?Lab Results  ?Component Value Date/Time  ? TSH 0.49 12/22/2021 03:00 PM  ? TSH 1.22 08/10/2020 02:03 PM  ? ? ? ?  Latest Ref Rng & Units 12/22/2021  ?  3:00 PM 08/10/2020  ?  2:03 PM 09/23/2019  ?  4:10 PM  ?CBC  ?WBC 4.0 - 10.5 K/uL 9.5   7.2   7.1    ?Hemoglobin 12.0 - 15.0 g/dL 14.2   13.4   13.3    ?Hematocrit 36.0 - 46.0 % 42.8   39.8   39.7    ?Platelets 150.0 - 400.0 K/uL 306.0   373.0   345.0    ? ? ?Lab Results  ?Component Value Date/Time  ? VD25OH 14.03 (L) 12/22/2021 03:00 PM  ? ? ?Clinical ASCVD: No  ?The 10-year ASCVD risk score (Arnett DK, et al., 2019) is: 4.9% ?  Values used to calculate the score: ?    Age: 66 years ?    Sex: Female ?    Is Non-Hispanic African American: No ?    Diabetic: No ?    Tobacco smoker: No ?    Systolic Blood Pressure: 583 mmHg ?    Is BP treated: No ?    HDL Cholesterol: 80 mg/dL ?    Total Cholesterol: 208 mg/dL   ? ? ?  12/22/2021  ?  1:52 PM 10/01/2020  ?  8:51 AM 09/23/2019  ?  3:34 PM  ?Depression screen PHQ 2/9  ?Decreased Interest 1 0 0  ?Down, Depressed, Hopeless 0 0 0  ?PHQ - 2 Score 1 0 0  ?  ? ?Social History  ? ?Tobacco Use  ?Smoking Status Former  ? Types: Cigarettes  ?Smokeless Tobacco Never  ?Tobacco Comments  ? Last cigarette a week ago  ? ?BP Readings from Last 3 Encounters:  ?12/22/21 120/76  ?08/09/21 128/76  ?07/14/21 120/78  ? ?Pulse Readings from Last 3 Encounters:  ?12/22/21 91  ?08/09/21 92  ?07/14/21 75  ? ?Wt Readings from Last 3 Encounters:  ?12/22/21 142 lb 6 oz (64.6 kg)  ?08/09/21 147 lb 12.8 oz (67 kg)  ?07/14/21 147 lb (66.7 kg)  ? ?BMI Readings from Last 3 Encounters:  ?12/22/21 26.04 kg/m?  ?08/09/21 27.03 kg/m?  ?07/14/21 26.89 kg/m?  ? ? ?Assessment/Interventions: Review of patient past medical history, allergies, medications, health status, including review of consultants reports, laboratory and other test data, was performed as part of comprehensive evaluation and provision of chronic care management services.  ? ?SDOH:  (Social  Determinants of Health) assessments and interventions performed: Yes ? ?SDOH Screenings  ? ?Alcohol Screen: Not  on file  ?Depression (PHQ2-9): Low Risk   ? PHQ-2 Score: 1  ?Financial Resource Strain: Low Risk   ? Difficulty of Paying Living Expenses: Not very hard  ?Food Insecurity: Not on file  ?Housing: Not on file  ?Physical Activity: Not on file  ?Social Connections: Not on file  ?Stress: Not on file  ?Tobacco Use: Medium Risk  ? Smoking Tobacco Use: Former  ? Smokeless Tobacco Use: Never  ? Passive Exposure: Not on file  ?Transportation Needs: Not on file  ? ? ?[place] ? ?Allergies  ?Allergen Reactions  ? Codeine Nausea And Vomiting  ? Erythromycin Nausea And Vomiting  ? Ampicillin Nausea And Vomiting, Rash and Other (See Comments)  ?  Pt states that rash was on her tongue.  ?Has patient had a PCN reaction causing immediate rash, facial/tongue/throat swelling, SOB or lightheadedness with hypotension yes ?Has patient had a PCN reaction causing severe rash involving mucus membranes or skin necrosis: no ?Has patient had a PCN reaction that required hospitalization yes ?Has patient had a PCN reaction occurring within the last 10 years: no ?If all of the above answers are "NO", then may proceed with Cephalosporin use. ?  ? ? ?Medications Reviewed Today   ? ? Reviewed by Tomasa Blase, [facility] (Pharmacist) on 01/06/22 at 1438  Med List Status: <None>  ? ?Medication Order Taking? Sig Documenting Provider Last Dose Status Informant  ?albuterol (PROVENTIL) (2.5 MG/3ML) 0.083% nebulizer solution [id number] Yes Take 3 mLs (2.5 mg total) by nebulization every 4 (four) hours as needed for wheezing or shortness of breath. Lynn Lima, MD Taking Active   ?ALPRAZolam (XANAX) 0.25 MG tablet [id number] Yes TAKE 1 TABLET(0.25 MG) BY MOUTH TWICE DAILY AS NEEDED FOR ANXIETY Lynn Lima, MD Taking Active   ?Budeson-Glycopyrrol-Formoterol (BREZTRI AEROSPHERE) 160-9-4.8 MCG/ACT AERO [id number] Yes Inhale 2 puffs into the  lungs in the morning and at bedtime.  ?Patient taking differently: Inhale 2 puffs into the lungs in the morning and at bedtime. VIA [place] and [place]  ? Magdalen Spatz, NP Taking Active   ?Calcium Carbonate-Vitamin D

## 2022-01-14 DIAGNOSIS — J431 Panlobular emphysema: Secondary | ICD-10-CM

## 2022-01-14 DIAGNOSIS — E785 Hyperlipidemia, unspecified: Secondary | ICD-10-CM

## 2022-01-26 ENCOUNTER — Telehealth: Payer: Self-pay | Admitting: Internal Medicine

## 2022-01-26 NOTE — Telephone Encounter (Signed)
Left message for patient to call back to schedule Medicare Annual Wellness Visit   No hx of AWV eligible as of 04/16/21  Please schedule at anytime with [facility] Health Advisor if patient calls the office back.     Any questions, please call me at [phone]  

## 2022-01-31 ENCOUNTER — Ambulatory Visit (INDEPENDENT_AMBULATORY_CARE_PROVIDER_SITE_OTHER): Payer: Medicare Other

## 2022-01-31 VITALS — BP 116/70 | HR 84 | Temp 98.2°F | Ht 62.0 in | Wt 143.0 lb

## 2022-01-31 DIAGNOSIS — Z0000 Encounter for general adult medical examination without abnormal findings: Secondary | ICD-10-CM

## 2022-01-31 NOTE — Progress Notes (Signed)
? ?Subjective:  ? Lynn Kline is a 66 y.o. female who presents for Medicare Annual (Subsequent) preventive examination. ? ?Review of Systems    ? ?Cardiac Risk Factors include: advanced age (>71mn, >>35women);dyslipidemia;family history of premature cardiovascular disease ? ?   ?Objective:  ?  ?Today's Vitals  ? 01/31/22 0841  ?BP: 116/70  ?Pulse: 84  ?Temp: 98.2 ?F (36.8 ?C)  ?SpO2: 91%  ?Weight: 143 lb (64.9 kg)  ?Height: '5\' 2"'$  (1.575 m)  ?PainSc: 0-No pain  ? ?Body mass index is 26.16 kg/m?. ? ? ?  01/31/2022  ?  9:10 AM 11/03/2020  ?  7:26 AM 11/22/2016  ?  7:48 AM 10/30/2014  ? 10:38 AM 08/07/2014  ? 10:05 AM  ?Advanced Directives  ?Does Patient Have a Medical Advance Directive? No No No No No  ?Would patient like information on creating a medical advance directive? Yes (MAU/Ambulatory/Procedural Areas - Information given) No - Patient declined     ? ? ?Current Medications (verified) ?Outpatient Encounter Medications as of 01/31/2022  ?Medication Sig  ? albuterol (PROVENTIL) (2.5 MG/3ML) 0.083% nebulizer solution Take 3 mLs (2.5 mg total) by nebulization every 4 (four) hours as needed for wheezing or shortness of breath.  ? ALPRAZolam (XANAX) 0.25 MG tablet TAKE 1 TABLET(0.25 MG) BY MOUTH TWICE DAILY AS NEEDED FOR ANXIETY  ? Budeson-Glycopyrrol-Formoterol (BREZTRI AEROSPHERE) 160-9-4.8 MCG/ACT AERO Inhale 2 puffs into the lungs in the morning and at bedtime. (Patient taking differently: Inhale 2 puffs into the lungs in the morning and at bedtime. VIA [place] and ME)  ? Calcium Carbonate-Vitamin D (CALCIUM PLUS VITAMIN D PO) Take 1 tablet by mouth daily. '1200mg'$  calcium and 1000 units vitamin D  ? VENTOLIN HFA 108 (90 Base) MCG/ACT inhaler INHALE 1 TO 2 PUFFS INTO THE LUNGS EVERY 6 HOURS AS NEEDED FOR WHEEZING OR SHORTNESS OF BREATH  ? ?No facility-administered encounter medications on file as of 01/31/2022.  ? ? ?Allergies (verified) ?Codeine, Erythromycin, and Ampicillin  ? ?History: ?Past Medical History:   ?Diagnosis Date  ? Anxiety   ? Atherosclerosis   ? Colon polyp, hyperplastic   ? COPD [place]ch[place] disease) [place]   ? Diverticulosis   ? Gallstone   ? Pneumonia   ? ?Past Surgical History:  ?Procedure Laterality Date  ? ABDOMINAL HYSTERECTOMY    ? BRONCHIAL BIOPSY  11/03/2020  ? Procedure: BRONCHIAL BIOPSIES;  Surgeon: IGarner Nash DO;  [facility];  Service: Pulmonary;;  ? BRONCHIAL BRUSHINGS  11/03/2020  ? Procedure: BRONCHIAL BRUSHINGS;  Surgeon: IGarner Nash DO;  [facility];  Service: Pulmonary;;  ? BRONCHIAL NEEDLE ASPIRATION BIOPSY  11/03/2020  ? Procedure: BRONCHIAL NEEDLE ASPIRATION BIOPSIES;  Surgeon: IGarner Nash DO;  [facility] [facility]  Service: Pulmonary;;  ? BRONCHIAL WASHINGS  11/03/2020  ? Procedure: BRONCHIAL WASHINGS;  Surgeon: IGarner Nash DO;  [facility];  Service: Pulmonary;;  ? CESAREAN SECTION    ? CHOLECYSTECTOMY    ? COLONOSCOPY    ? FIDUCIAL MARKER PLACEMENT  11/03/2020  ? Procedure: FIDUCIAL MARKER PLACEMENT;  Surgeon: IGarner Nash DO;  [facility];  Service: Pulmonary;;  ? VIDEO BRONCHOSCOPY WITH ENDOBRONCHIAL NAVIGATION N/A 11/03/2020  ? Procedure: VIDEO BRONCHOSCOPY WITH ENDOBRONCHIAL NAVIGATION AND POSSIBLE ULTRASOUND;  Surgeon: IGarner Nash DO;  [facility]  Service: Pulmonary;  Laterality: N/A;  ? VIDEO BRONCHOSCOPY WITH ENDOBRONCHIAL ULTRASOUND  11/03/2020  ? Procedure: VIDEO BRONCHOSCOPY WITH ENDOBRONCHIAL ULTRASOUND;  Surgeon: IGarner Nash  DO;  Location: [facility];  Service: Pulmonary;;  ? ?Family History  ?Problem Relation Age of Onset  ? Arthritis Mother   ? COPD Mother   ? Emphysema Mother   ? Stroke Father   ? Heart disease Father   ? Cancer Father   ?     MELANOMA  ? Breast cancer Maternal Grandmother   ? Colon cancer Neg Hx   ? ?Social History  ? ?Socioeconomic History  ? Marital status: Divorced  ?  Spouse name: Not on file  ? Number of children: Not on file  ?  Years of education: Not on file  ? Highest education level: Not on file  ?Occupational History  ? Not on file  ?Tobacco Use  ? Smoking status: Former  ?  Types: Cigarettes  ? Smokeless tobacco: Never  ? Tobacco comments:  ?  Last cigarette a week ago  ?Substance and Sexual Activity  ? Alcohol use: No  ?  Alcohol/week: 0.0 standard drinks  ? Drug use: No  ? Sexual activity: Never  ?  Comment: 1ST INTERCOURSE- 89, PARTNERS- 4  ?Other Topics Concern  ? Not on file  ?Social History Narrative  ? Not on file  ? ?Social Determinants of Health  ? ?Financial Resource Strain: Low Risk   ? Difficulty of Paying Living Expenses: Not hard at all  ?Food Insecurity: No Food Insecurity  ? Worried About Charity fundraiser in the Last Year: Never true  ? Ran Out of Food in the Last Year: Never true  ?Transportation Needs: No Transportation Needs  ? Lack of Transportation (Medical): No  ? Lack of Transportation (Non-Medical): No  ?Physical Activity: Not on file  ?Stress: No Stress Concern Present  ? Feeling of Stress : Not at all  ?Social Connections: Moderately Integrated  ? Frequency of Communication with Friends and Family: More than three times a week  ? Frequency of Social Gatherings with Friends and Family: More than three times a week  ? Attends Religious Services: 1 to 4 times per year  ? Active Member of Clubs or Organizations: Yes  ? Attends Archivist Meetings: 1 to 4 times per year  ? Marital Status: Divorced  ? ? ?Tobacco Counseling ?Counseling given: Not Answered ?Tobacco comments: Last cigarette a week ago ? ? ?Clinical Intake: ? ?Pre-visit preparation completed: Yes ? ?Pain : No/denies pain ?Pain Score: 0-No pain ? ?  ? ?BMI - recorded: 26.16 ?Nutritional Status: BMI 25 -29 Overweight ?Nutritional Risks: None ?Diabetes: No ? ?How often do you need to have someone help you when you read instructions, pamphlets, or other written materials from your doctor or pharmacy?: 1 - Never ?What is the last grade level  you completed in school?: HSG ? ?Diabetic? no ? ?Interpreter Needed?: No ? ?Information entered by :: Lisette Abu, LPN ? ? ?Activities of Daily Living ? ?  01/31/2022  ?  9:12 AM  ?In your present state of health, do you have any difficulty performing the following activities:  ?Hearing? 0  ?Vision? 0  ?Difficulty concentrating or making decisions? 0  ?Walking or climbing stairs? 0  ?Dressing or bathing? 0  ?Doing errands, shopping? 0  ?Preparing Food and eating ? N  ?Using the Toilet? N  ?In the past six months, have you accidently leaked urine? N  ?Do you have problems with loss of bowel control? N  ?Managing your Medications? N  ?Managing your Finances? N  ?Housekeeping or managing your Housekeeping? N  ? ? ?  Patient Care Team: ?Janith Lima, MD as PCP - General (Internal Medicine) ?Tomasa Blase, [facility] as Pharmacist (Pharmacist) ? ?Indicate any recent Medical Services you may have received from other than [facility] providers in the past year (date may be approximate). ? ?   ?Assessment:  ? This is a routine wellness examination for [facility]. ? ?Hearing/Vision screen ?No results found. ? ?Dietary issues and exercise activities discussed: ?Current Exercise Habits: The patient has a physically strenuous job, but has no regular exercise apart from work., Exercise limited by: respiratory conditions(s) ? ? Goals Addressed   ? ?  ?  ?  ?  ? This Visit's Progress  ?  My goal is to lose a couple of pounds.     ? ?  ?Depression Screen ? ?  01/31/2022  ?  9:12 AM 12/22/2021  ?  1:52 PM 10/01/2020  ?  8:51 AM 09/23/2019  ?  3:34 PM  ?PHQ 2/9 Scores  ?PHQ - 2 Score 0 1 0 0  ?  ?Fall Risk ? ?  01/31/2022  ?  9:12 AM 12/22/2021  ?  1:52 PM  ?Fall Risk   ?Falls in the past year? 0 0  ?Number falls in past yr: 0   ?Injury with Fall? 0   ?Risk for fall due to : No Fall Risks   ?Follow up Falls evaluation completed   ? ? ?FALL RISK PREVENTION PERTAINING TO THE HOME: ? ?Any stairs in or around the home? No  ?If so, are there any without  handrails? No  ?Home free of loose throw rugs in walkways, pet beds, electrical cords, etc? Yes  ?Adequate lighting in your home to reduce risk of falls? Yes  ? ?ASSISTIVE DEVICES UTILIZED TO PREVENT FALLS: ? ?Life a

## 2022-01-31 NOTE — Patient Instructions (Signed)
Lynn Kline , ?Thank you for taking time to come for your Medicare Wellness Visit. I appreciate your ongoing commitment to your health goals. Please review the following plan we discussed and let me know if I can assist you in the future.  ? ?Screening recommendations/referrals: ?Colonoscopy: 03/20/2012; due every 10 years ?Mammogram: 01/19/2012; due every year (overdue) ?Bone Density: 08/24/2018; due every 2 years (overdue) ?Recommended yearly ophthalmology/optometry visit for glaucoma screening and checkup ?Recommended yearly dental visit for hygiene and checkup ? ?Vaccinations: ?Influenza vaccine: 07/14/2021 ?Pneumococcal vaccine: 09/14/2005, 12/22/2021 ?Tdap vaccine: 09/22/2013; due every 10 years ?Shingles vaccine: never done; can check with local pharmacy for vaccine and cost ?Covid-19: 07/04/2020, 07/25/2020 ? ?Advanced directives: No; paperwork given to patient. ? ?Conditions/risks identified: Yes ? ?Next appointment: Please schedule your next Medicare Wellness Visit with your Nurse Health Advisor in 1 year by calling [phone]. ? ? ?Preventive Care 12 Years and Older, Female ?Preventive care refers to lifestyle choices and visits with your health care provider that can promote health and wellness. ?What does preventive care include? ?A yearly physical exam. This is also called an annual well check. ?Dental exams once or twice a year. ?Routine eye exams. Ask your health care provider how often you should have your eyes checked. ?Personal lifestyle choices, including: ?Daily care of your teeth and gums. ?Regular physical activity. ?Eating a healthy diet. ?Avoiding tobacco and drug use. ?Limiting alcohol use. ?Practicing safe sex. ?Taking low-dose aspirin every day. ?Taking vitamin and mineral supplements as recommended by your health care provider. ?What happens during an annual well check? ?The services and screenings done by your health care provider during your annual well check will depend on your age, overall  health, lifestyle risk factors, and family history of disease. ?Counseling  ?Your health care provider may ask you questions about your: ?Alcohol use. ?Tobacco use. ?Drug use. ?Emotional well-being. ?Home and relationship well-being. ?Sexual activity. ?Eating habits. ?History of falls. ?Memory and ability to understand (cognition). ?Work and work Statistician. ?Reproductive health. ?Screening  ?You may have the following tests or measurements: ?Height, weight, and BMI. ?Blood pressure. ?Lipid and cholesterol levels. These may be checked every 5 years, or more frequently if you are over 42 years old. ?Skin check. ?Lung cancer screening. You may have this screening every year starting at age 80 if you have a 30-pack-year history of smoking and currently smoke or have quit within the past 15 years. ?Fecal occult blood test (FOBT) of the stool. You may have this test every year starting at age 31. ?Flexible sigmoidoscopy or colonoscopy. You may have a sigmoidoscopy every 5 years or a colonoscopy every 10 years starting at age 60. ?Hepatitis C blood test. ?Hepatitis B blood test. ?Sexually transmitted disease (STD) testing. ?Diabetes screening. This is done by checking your blood sugar (glucose) after you have not eaten for a while (fasting). You may have this done every 1-3 years. ?Bone density scan. This is done to screen for osteoporosis. You may have this done starting at age 41. ?Mammogram. This may be done every 1-2 years. Talk to your health care provider about how often you should have regular mammograms. ?Talk with your health care provider about your test results, treatment options, and if necessary, the need for more tests. ?Vaccines  ?Your health care provider may recommend certain vaccines, such as: ?Influenza vaccine. This is recommended every year. ?Tetanus, diphtheria, and acellular pertussis (Tdap, Td) vaccine. You may need a Td booster every 10 years. ?Zoster vaccine. You may need  this after age  23. ?Pneumococcal 13-valent conjugate (PCV13) vaccine. One dose is recommended after age 68. ?Pneumococcal polysaccharide (PPSV23) vaccine. One dose is recommended after age 68. ?Talk to your health care provider about which screenings and vaccines you need and how often you need them. ?This information is not intended to replace advice given to you by your health care provider. Make sure you discuss any questions you have with your health care provider. ?Document Released: 10/30/2015 Document Revised: 06/22/2016 Document Reviewed: 08/04/2015 ?Elsevier Interactive Patient Education ? 2017 [employer]. ? ?Fall Prevention in the Home ?Falls can cause injuries. They can happen to people of all ages. There are many things you can do to make your home safe and to help prevent falls. ?What can I do on the outside of my home? ?Regularly fix the edges of walkways and driveways and fix any cracks. ?Remove anything that might make you trip as you walk through a door, such as a raised step or threshold. ?Trim any bushes or trees on the path to your home. ?Use bright outdoor lighting. ?Clear any walking paths of anything that might make someone trip, such as rocks or tools. ?Regularly check to see if handrails are loose or broken. Make sure that both sides of any steps have handrails. ?Any raised decks and porches should have guardrails on the edges. ?Have any leaves, snow, or ice cleared regularly. ?Use sand or salt on walking paths during winter. ?Clean up any spills in your garage right away. This includes oil or grease spills. ?What can I do in the bathroom? ?Use night lights. ?Install grab bars by the toilet and in the tub and shower. Do not use towel bars as grab bars. ?Use non-skid mats or decals in the tub or shower. ?If you need to sit down in the shower, use a plastic, non-slip stool. ?Keep the floor dry. Clean up any water that spills on the floor as soon as it happens. ?Remove soap buildup in the tub or shower  regularly. ?Attach bath mats securely with double-sided non-slip rug tape. ?Do not have throw rugs and other things on the floor that can make you trip. ?What can I do in the bedroom? ?Use night lights. ?Make sure that you have a light by your bed that is easy to reach. ?Do not use any sheets or blankets that are too big for your bed. They should not hang down onto the floor. ?Have a firm chair that has side arms. You can use this for support while you get dressed. ?Do not have throw rugs and other things on the floor that can make you trip. ?What can I do in the kitchen? ?Clean up any spills right away. ?Avoid walking on wet floors. ?Keep items that you use a lot in easy-to-reach places. ?If you need to reach something above you, use a strong step stool that has a grab bar. ?Keep electrical cords out of the way. ?Do not use floor polish or wax that makes floors slippery. If you must use wax, use non-skid floor wax. ?Do not have throw rugs and other things on the floor that can make you trip. ?What can I do with my stairs? ?Do not leave any items on the stairs. ?Make sure that there are handrails on both sides of the stairs and use them. Fix handrails that are broken or loose. Make sure that handrails are as long as the stairways. ?Check any carpeting to make sure that it is firmly attached to  the stairs. Fix any carpet that is loose or worn. ?Avoid having throw rugs at the top or bottom of the stairs. If you do have throw rugs, attach them to the floor with carpet tape. ?Make sure that you have a light switch at the top of the stairs and the bottom of the stairs. If you do not have them, ask someone to add them for you. ?What else can I do to help prevent falls? ?Wear shoes that: ?Do not have high heels. ?Have rubber bottoms. ?Are comfortable and fit you well. ?Are closed at the toe. Do not wear sandals. ?If you use a stepladder: ?Make sure that it is fully opened. Do not climb a closed stepladder. ?Make sure that  both sides of the stepladder are locked into place. ?Ask someone to hold it for you, if possible. ?Clearly mark and make sure that you can see: ?Any grab bars or handrails. ?First and last steps. ?Where the edge of

## 2022-03-17 ENCOUNTER — Other Ambulatory Visit: Payer: Self-pay | Admitting: Internal Medicine

## 2022-03-17 DIAGNOSIS — J441 Chronic obstructive pulmonary disease with (acute) exacerbation: Secondary | ICD-10-CM

## 2022-03-20 ENCOUNTER — Other Ambulatory Visit: Payer: Self-pay | Admitting: Nurse Practitioner

## 2022-03-20 ENCOUNTER — Telehealth: Payer: Medicare Other | Admitting: Nurse Practitioner

## 2022-03-20 DIAGNOSIS — B9689 Other specified bacterial agents as the cause of diseases classified elsewhere: Secondary | ICD-10-CM | POA: Diagnosis not present

## 2022-03-20 DIAGNOSIS — J208 Acute bronchitis due to other specified organisms: Secondary | ICD-10-CM | POA: Diagnosis not present

## 2022-03-20 MED ORDER — BENZONATATE 100 MG PO CAPS: 100.0000 mg | ORAL_CAPSULE | Freq: Two times a day (BID) | ORAL | 0 refills | Status: DC | PRN

## 2022-03-20 MED ORDER — PREDNISONE 10 MG PO TABS: ORAL_TABLET | ORAL | 0 refills | Status: DC

## 2022-03-20 MED ORDER — PREDNISONE 10 MG PO TABS
ORAL_TABLET | ORAL | 0 refills | Status: DC
Start: 2022-03-20 — End: 2022-03-20

## 2022-03-20 MED ORDER — DOXYCYCLINE HYCLATE 100 MG PO TABS
100.0000 mg | ORAL_TABLET | Freq: Two times a day (BID) | ORAL | 0 refills | Status: AC
Start: 2022-03-20 — End: 2022-03-27

## 2022-03-20 MED ORDER — DOXYCYCLINE HYCLATE 100 MG PO TABS: 100.0000 mg | ORAL_TABLET | Freq: Two times a day (BID) | ORAL | 0 refills | Status: DC

## 2022-03-20 NOTE — Progress Notes (Signed)
We are sorry that you are not feeling well.  Here is how we plan to help!  Based on your presentation I believe you most likely have A cough due to bacteria.  When patients have a fever and a productive cough with a change in color or increased sputum production, we are concerned about bacterial bronchitis.  If left untreated it can progress to pneumonia.  If your symptoms do not improve with your treatment plan it is important that you contact your provider.   I have prescribed Doxycycline 100 mg twice a day for 7 days     In addition you may use A prescription cough medication called Tessalon Perles '100mg'$ . You may take 1-2 capsules every 8 hours as needed for your cough.  Prednisone 5 mg daily for 6 days (see taper instructions below)  Directions for 6 day taper: Day 1: 2 tablets before breakfast, 1 after both lunch & dinner and 2 at bedtime Day 2: 1 tab before breakfast, 1 after both lunch & dinner and 2 at bedtime Day 3: 1 tab at each meal & 1 at bedtime Day 4: 1 tab at breakfast, 1 at lunch, 1 at bedtime Day 5: 1 tab at breakfast & 1 tab at bedtime Day 6: 1 tab at breakfast  From your responses in the eVisit questionnaire you describe inflammation in the upper respiratory tract which is causing a significant cough.  This is commonly called Bronchitis and has four common causes:   Allergies Viral Infections Acid Reflux Bacterial Infection Allergies, viruses and acid reflux are treated by controlling symptoms or eliminating the cause. An example might be a cough caused by taking certain blood pressure medications. You stop the cough by changing the medication. Another example might be a cough caused by acid reflux. Controlling the reflux helps control the cough.  USE OF BRONCHODILATOR ("RESCUE") INHALERS: There is a risk from using your bronchodilator too frequently.  The risk is that over-reliance on a medication which only relaxes the muscles surrounding the breathing tubes can reduce  the effectiveness of medications prescribed to reduce swelling and congestion of the tubes themselves.  Although you feel brief relief from the bronchodilator inhaler, your asthma may actually be worsening with the tubes becoming more swollen and filled with mucus.  This can delay other crucial treatments, such as oral steroid medications. If you need to use a bronchodilator inhaler daily, several times per day, you should discuss this with your provider.  There are probably better treatments that could be used to keep your asthma under control.     HOME CARE Only take medications as instructed by your medical team. Complete the entire course of an antibiotic. Drink plenty of fluids and get plenty of rest. Avoid close contacts especially the very young and the elderly Cover your mouth if you cough or cough into your sleeve. Always remember to wash your hands A steam or ultrasonic humidifier can help congestion.   GET HELP RIGHT AWAY IF: You develop worsening fever. You become short of breath You cough up blood. Your symptoms persist after you have completed your treatment plan MAKE SURE YOU  Understand these instructions. Will watch your condition. Will get help right away if you are not doing well or get worse.    Thank you for choosing an e-visit.  Your e-visit answers were reviewed by a board certified advanced clinical practitioner to complete your personal care plan. Depending upon the condition, your plan could have included both over the  counter or prescription medications.  Please review your pharmacy choice. Make sure the pharmacy is open so you can pick up prescription now. If there is a problem, you may contact your provider through [employer] and have the prescription routed to another pharmacy.  Your safety is important to [place]. If you have drug allergies check your prescription carefully.   For the next 24 hours you can use MyChart to ask questions about today's visit,  request a non-urgent call back, or ask for a work or school excuse. You will get an email in the next two days asking about your experience. I hope that your e-visit has been valuable and will speed your recovery.

## 2022-03-20 NOTE — Progress Notes (Signed)
I have spent 5 minutes in review of e-visit questionnaire, review and updating patient chart, medical decision making and response to patient.  ° °Tekela Garguilo W Alhassan Everingham, NP ° °  °

## 2022-03-29 ENCOUNTER — Other Ambulatory Visit: Payer: Self-pay | Admitting: Internal Medicine

## 2022-03-29 DIAGNOSIS — F419 Anxiety disorder, unspecified: Secondary | ICD-10-CM

## 2022-05-11 ENCOUNTER — Telehealth: Payer: Self-pay | Admitting: Internal Medicine

## 2022-06-01 ENCOUNTER — Ambulatory Visit: Payer: Self-pay | Admitting: Licensed Clinical Social Worker

## 2022-06-01 ENCOUNTER — Telehealth: Payer: Medicare Other | Admitting: Nurse Practitioner

## 2022-06-01 DIAGNOSIS — J209 Acute bronchitis, unspecified: Secondary | ICD-10-CM

## 2022-06-01 MED ORDER — PREDNISONE 20 MG PO TABS: 40.0000 mg | ORAL_TABLET | Freq: Every day | ORAL | 0 refills | Status: AC

## 2022-06-01 NOTE — Patient Outreach (Signed)
  Care Coordination  Initial Visit Note   06/01/2022 Name: KINGSLEY HERANDEZ MRN: [id number] DOB: Feb 26, 1956  TIYANA GALLA is a 66 y.o. year old female who sees Janith Lima, MD for primary care. I spoke with  Lauralee Evener by phone today  What matters to the patients health and wellness today?  No needs at this time. " I'm pretty good"    Goals Addressed             This Visit's Progress    COMPLETED: Care Coordination Activities/ No Follow up Required       Care Coordination Interventions: Reviewed Care Coordination Services:declined         SDOH assessments and interventions completed:  Yes  SDOH Interventions Today    Flowsheet Row Most Recent Value  SDOH Interventions   SDOH Interventions for the Following Domains Financial Strain, Food Insecurity, Housing, Stress, Transportation  Food Insecurity Interventions Intervention Not Indicated  Financial Strain Interventions Intervention Not Indicated  Housing Interventions Intervention Not Indicated  Stress Interventions Intervention Not Indicated  Transportation Interventions Intervention Not Indicated       Care Coordination Interventions Activated:  No  Care Coordination Interventions:  No, not indicated   Follow up plan: No further intervention required.   Encounter Outcome:  Pt. Visit Completed   Casimer Lanius, [place] [phone]

## 2022-06-01 NOTE — Progress Notes (Signed)
We are sorry that you are not feeling well.  Here is how we plan to help!  Based on your presentation I believe you most likely have A cough due to a virus.  This is called viral bronchitis and is best treated by rest, plenty of fluids and control of the cough.  You may use Ibuprofen or Tylenol as directed to help your symptoms.     In addition you may use A non-prescription cough medication called Robitussin DAC. Take 2 teaspoons every 8 hours or Delsym: take 2 teaspoons every 12 hours.  Prednisone '20mg'$  2 po AT the same time daily for 5 days  From your responses in the eVisit questionnaire you describe inflammation in the upper respiratory tract which is causing a significant cough.  This is commonly called Bronchitis and has four common causes:   Allergies Viral Infections Acid Reflux Bacterial Infection Allergies, viruses and acid reflux are treated by controlling symptoms or eliminating the cause. An example might be a cough caused by taking certain blood pressure medications. You stop the cough by changing the medication. Another example might be a cough caused by acid reflux. Controlling the reflux helps control the cough.  USE OF BRONCHODILATOR ("RESCUE") INHALERS: There is a risk from using your bronchodilator too frequently.  The risk is that over-reliance on a medication which only relaxes the muscles surrounding the breathing tubes can reduce the effectiveness of medications prescribed to reduce swelling and congestion of the tubes themselves.  Although you feel brief relief from the bronchodilator inhaler, your asthma may actually be worsening with the tubes becoming more swollen and filled with mucus.  This can delay other crucial treatments, such as oral steroid medications. If you need to use a bronchodilator inhaler daily, several times per day, you should discuss this with your provider.  There are probably better treatments that could be used to keep your asthma under control.      HOME CARE Only take medications as instructed by your medical team. Complete the entire course of an antibiotic. Drink plenty of fluids and get plenty of rest. Avoid close contacts especially the very young and the elderly Cover your mouth if you cough or cough into your sleeve. Always remember to wash your hands A steam or ultrasonic humidifier can help congestion.   GET HELP RIGHT AWAY IF: You develop worsening fever. You become short of breath You cough up blood. Your symptoms persist after you have completed your treatment plan MAKE SURE YOU  Understand these instructions. Will watch your condition. Will get help right away if you are not doing well or get worse.    Thank you for choosing an e-visit.  Your e-visit answers were reviewed by a board certified advanced clinical practitioner to complete your personal care plan. Depending upon the condition, your plan could have included both over the counter or prescription medications.  Please review your pharmacy choice. Make sure the pharmacy is open so you can pick up prescription now. If there is a problem, you may contact your provider through [employer] and have the prescription routed to another pharmacy.  Your safety is important to [place]. If you have drug allergies check your prescription carefully.   For the next 24 hours you can use MyChart to ask questions about today's visit, request a non-urgent call back, or ask for a work or school excuse. You will get an email in the next two days asking about your experience. I hope that your e-visit has  been valuable and will speed your recovery.  5-10 minutes spent reviewing and documenting in chart.

## 2022-06-01 NOTE — Patient Instructions (Signed)
Visit Information  Thank you for taking time to visit with me today. Please don't hesitate to contact me if I can be of assistance to you.   No goals were established today:   Goals Addressed             This Visit's Progress    COMPLETED: Care Coordination Activities/ No Follow up Required       Care Coordination Interventions: Reviewed Care Coordination Services:declined         Please call the care guide team at [phone] if you need to schedule an appointment with a Nurse or Social Work Care Coordinator.   If you are experiencing a Mental Health or [place] or need someone to talk to, please call the Suicide and Crisis Lifeline: 988 call 1-800-273-TALK (toll free, 24 hour hotline)   Patient verbalizes understanding of instructions and care plan provided today and agrees to view in [place]. Active MyChart status and patient understanding of how to access instructions and care plan via MyChart confirmed with patient.     No further follow up required: by Care Coordination at this time  Casimer Lanius, [place] [phone]

## 2022-06-19 ENCOUNTER — Other Ambulatory Visit: Payer: Self-pay | Admitting: Internal Medicine

## 2022-06-19 DIAGNOSIS — J441 Chronic obstructive pulmonary disease with (acute) exacerbation: Secondary | ICD-10-CM

## 2022-06-27 ENCOUNTER — Telehealth: Payer: Self-pay | Admitting: Pulmonary Disease

## 2022-06-27 NOTE — Telephone Encounter (Signed)
Called patient and advised her that we will be on the look out for request from [facility]. Nothing further needed at this time

## 2022-06-30 NOTE — Telephone Encounter (Signed)
Patient is calling because she still has not heard anything about her request from [facility].  She stated that told her it was faxed to our office on 9/11.  Please advise.  CB# [phone]

## 2022-07-01 MED ORDER — BREZTRI AEROSPHERE 160-9-4.8 MCG/ACT IN AERO: 2.0000 | INHALATION_SPRAY | Freq: Two times a day (BID) | RESPIRATORY_TRACT | 3 refills | Status: DC

## 2022-07-01 NOTE — Telephone Encounter (Signed)
Rx sent electronically to [facility]'s pharmacy for pt. Called and spoke with pt letting her know this had been done and she verbalized understanding. Nothing further needed.

## 2022-07-01 NOTE — Addendum Note (Signed)
Addended by: Lorretta Harp on: 07/01/2022 04:08 PM   Modules accepted: Orders

## 2022-07-07 ENCOUNTER — Other Ambulatory Visit: Payer: Self-pay | Admitting: Internal Medicine

## 2022-07-07 DIAGNOSIS — F419 Anxiety disorder, unspecified: Secondary | ICD-10-CM

## 2022-07-08 ENCOUNTER — Ambulatory Visit (INDEPENDENT_AMBULATORY_CARE_PROVIDER_SITE_OTHER): Payer: Medicare Other

## 2022-07-08 ENCOUNTER — Ambulatory Visit (INDEPENDENT_AMBULATORY_CARE_PROVIDER_SITE_OTHER): Payer: Medicare Other | Admitting: Internal Medicine

## 2022-07-08 VITALS — BP 152/88 | HR 80 | Temp 97.9°F | Ht 62.0 in | Wt 148.0 lb

## 2022-07-08 DIAGNOSIS — E559 Vitamin D deficiency, unspecified: Secondary | ICD-10-CM | POA: Diagnosis not present

## 2022-07-08 DIAGNOSIS — J441 Chronic obstructive pulmonary disease with (acute) exacerbation: Secondary | ICD-10-CM | POA: Diagnosis not present

## 2022-07-08 DIAGNOSIS — F419 Anxiety disorder, unspecified: Secondary | ICD-10-CM | POA: Diagnosis not present

## 2022-07-08 DIAGNOSIS — R059 Cough, unspecified: Secondary | ICD-10-CM | POA: Diagnosis not present

## 2022-07-08 DIAGNOSIS — R079 Chest pain, unspecified: Secondary | ICD-10-CM | POA: Diagnosis not present

## 2022-07-08 DIAGNOSIS — R0602 Shortness of breath: Secondary | ICD-10-CM | POA: Diagnosis not present

## 2022-07-08 MED ORDER — LEVOFLOXACIN 500 MG PO TABS: 500.0000 mg | ORAL_TABLET | Freq: Every day | ORAL | 0 refills | Status: AC

## 2022-07-08 MED ORDER — METHYLPREDNISOLONE ACETATE 80 MG/ML IJ SUSP
80.0000 mg | Freq: Once | INTRAMUSCULAR | Status: AC
  Administered 2022-07-08: 80 mg via INTRAMUSCULAR

## 2022-07-08 MED ORDER — PREDNISONE 10 MG PO TABS: ORAL_TABLET | ORAL | 0 refills | Status: DC

## 2022-07-08 MED ORDER — HYDROCODONE BIT-HOMATROP MBR 5-1.5 MG/5ML PO SOLN: 5.0000 mL | Freq: Four times a day (QID) | ORAL | 0 refills | Status: AC | PRN

## 2022-07-08 MED ORDER — ALPRAZOLAM 0.25 MG PO TABS: ORAL_TABLET | ORAL | 2 refills | Status: DC

## 2022-07-08 NOTE — Progress Notes (Unsigned)
Patient ID: Lynn Kline, female   DOB: Mar 14, 1956, 66 y.o.   MRN: [id number]        Chief Complaint: follow up anxiety (PCP not in office today), and cough with wheezing       HPI:  Lynn Kline is a 66 y.o. female here to f/u after xanax has run out, needs rather urgent med as also has 3  days onset worsening feeling warm, scant prod cough, with sob and wheezing, despite the nebulizer use and breztri good compliance.  Has pulmonary f/u sept 26.  Pt denies chest pain, orthopnea, PND, increased LE swelling, palpitations, dizziness or syncope.   Pt denies polydipsia, polyuria, or new focal neuro s/s.   Pt denies fever, wt loss, night sweats, loss of appetite, or other constitutional symptoms         Wt Readings from Last 3 Encounters:  07/08/22 148 lb (67.1 kg)  01/31/22 143 lb (64.9 kg)  12/22/21 142 lb 6 oz (64.6 kg)   BP Readings from Last 3 Encounters:  07/08/22 (!) 152/88  01/31/22 116/70  12/22/21 120/76         Past Medical History:  Diagnosis Date   Anxiety    Atherosclerosis    Colon polyp, hyperplastic    COPD (chronic obstructive pulmonary disease) ([place])    Diverticulosis    Gallstone    Pneumonia    Past Surgical History:  Procedure Laterality Date   ABDOMINAL HYSTERECTOMY     BRONCHIAL BIOPSY  11/03/2020   Procedure: BRONCHIAL BIOPSIES;  Surgeon: Garner Nash, DO;  Location: [facility];  Service: Pulmonary;;   BRONCHIAL BRUSHINGS  11/03/2020   Procedure: BRONCHIAL BRUSHINGS;  Surgeon: Garner Nash, DO;  Location: [facility];  Service: Pulmonary;;   BRONCHIAL NEEDLE ASPIRATION BIOPSY  11/03/2020   Procedure: BRONCHIAL NEEDLE ASPIRATION BIOPSIES;  Surgeon: Garner Nash, DO;  Location: [facility];  Service: Pulmonary;;   BRONCHIAL WASHINGS  11/03/2020   Procedure: BRONCHIAL WASHINGS;  Surgeon: Garner Nash, DO;  Location: [facility];  Service: Pulmonary;;   CESAREAN SECTION     CHOLECYSTECTOMY     COLONOSCOPY     FIDUCIAL MARKER PLACEMENT   11/03/2020   Procedure: FIDUCIAL MARKER PLACEMENT;  Surgeon: Garner Nash, DO;  Location: [facility];  Service: Pulmonary;;   VIDEO BRONCHOSCOPY WITH ENDOBRONCHIAL NAVIGATION N/A 11/03/2020   Procedure: VIDEO BRONCHOSCOPY WITH ENDOBRONCHIAL NAVIGATION AND POSSIBLE ULTRASOUND;  Surgeon: Garner Nash, DO;  Location: [facility];  Service: Pulmonary;  Laterality: N/A;   VIDEO BRONCHOSCOPY WITH ENDOBRONCHIAL ULTRASOUND  11/03/2020   Procedure: VIDEO BRONCHOSCOPY WITH ENDOBRONCHIAL ULTRASOUND;  Surgeon: Garner Nash, DO;  Location: [facility];  Service: Pulmonary;;    reports that she has quit smoking. Her smoking use included cigarettes. She has never used smokeless tobacco. She reports that she does not drink alcohol and does not use drugs. family history includes Arthritis in her mother; Breast cancer in her maternal grandmother; COPD in her mother; Cancer in her father; Emphysema in her mother; Heart disease in her father; Stroke in her father. Allergies  Allergen Reactions   Codeine Nausea And Vomiting   Erythromycin Nausea And Vomiting   Ampicillin Nausea And Vomiting, Rash and Other (See Comments)    Pt states that rash was on her tongue.  Has patient had a PCN reaction causing immediate rash, facial/tongue/throat swelling, SOB or lightheadedness with hypotension yes Has patient had a PCN reaction causing severe rash involving mucus membranes or skin necrosis:  no Has patient had a PCN reaction that required hospitalization yes Has patient had a PCN reaction occurring within the last 10 years: no If all of the above answers are "NO", then may proceed with Cephalosporin use.    Current Outpatient Medications on File Prior to Visit  Medication Sig Dispense Refill   albuterol (PROVENTIL) (2.5 MG/3ML) 0.083% nebulizer solution Take 3 mLs (2.5 mg total) by nebulization every 4 (four) hours as needed for wheezing or shortness of breath. 75 mL 5   benzonatate (TESSALON) 100 MG  capsule Take 1 capsule (100 mg total) by mouth 2 (two) times daily as needed for cough. 28 capsule 0   Budeson-Glycopyrrol-Formoterol (BREZTRI AEROSPHERE) 160-9-4.8 MCG/ACT AERO Inhale 2 puffs into the lungs in the morning and at bedtime. 32.1 g 3   Calcium Carbonate-Vitamin D (CALCIUM PLUS VITAMIN D PO) Take 1 tablet by mouth daily. '1200mg'$  calcium and 1000 units vitamin D     VENTOLIN HFA 108 (90 Base) MCG/ACT inhaler INHALE 1 TO 2 PUFFS INTO THE LUNGS EVERY 6 HOURS AS NEEDED FOR WHEEZING OR SHORTNESS OF BREATH 18 g 3   No current facility-administered medications on file prior to visit.        ROS:  All others reviewed and negative.  Objective        PE:  BP (!) 152/88 (BP Location: Right Arm, Patient Position: Sitting, Cuff Size: Normal)   Pulse 80   Temp 97.9 F (36.6 C) (Oral)   Ht '5\' 2"'$  (1.575 m)   Wt 148 lb (67.1 kg)   SpO2 92%   BMI 27.07 kg/m                 Constitutional: Pt appears in NAD               HENT: Head: NCAT.                Right Ear: External ear normal.                 Left Ear: External ear normal.                Eyes: . Pupils are equal, round, and reactive to light. Conjunctivae and EOM are normal               Nose: without d/c or deformity               Neck: Neck supple. Gross normal ROM               Cardiovascular: Normal rate and regular rhythm.                 Pulmonary/Chest: Effort normal and breath sounds decreased without rales but with mild wheezing insp and expiratory without retractions                Abd:  Soft, NT, ND, + BS, no organomegaly               Neurological: Pt is alert. At baseline orientation, motor grossly intact               Skin: Skin is warm. No rashes, no other new lesions, LE edema - none               Psychiatric: Pt behavior is normal without agitation   Micro: none  Cardiac tracings I have personally interpreted today:  none  Pertinent Radiological findings (summarize): none   Lab Results  Component Value Date    WBC 9.5 12/22/2021   HGB 14.2 12/22/2021   HCT 42.8 12/22/2021   PLT 306.0 12/22/2021   GLUCOSE 97 12/22/2021   CHOL 208 (H) 12/22/2021   TRIG 59.0 12/22/2021   HDL 80.00 12/22/2021   LDLDIRECT 141.0 11/15/2011   LDLCALC 116 (H) 12/22/2021   ALT 16 12/22/2021   AST 15 12/22/2021   NA 139 12/22/2021   K 4.0 12/22/2021   CL 100 12/22/2021   CREATININE 0.65 12/22/2021   BUN 21 12/22/2021   CO2 32 12/22/2021   TSH 0.49 12/22/2021   INR 1.0 08/10/2020   HGBA1C 6.1 12/22/2021   Assessment/Plan:  Lynn Kline is a 66 y.o. White or Caucasian [1] female with  has a past medical history of Anxiety, Atherosclerosis, Colon polyp, hyperplastic, COPD (chronic obstructive pulmonary disease) ([place]), Diverticulosis, Gallstone, and Pneumonia.  COPD with acute exacerbation (HCC) Mild to mod, no resp distress of low sat, for depomedrol IM 80 mg, then levaquin course, prednisone taper, and cough med prn, also for CXR r/o pna,  to f/u any worsening symptoms or concerns  Anxiety Mild to mod, for xanax refill prn use,  to f/u any worsening symptoms or concerns  Vitamin D deficiency Last vitamin D Lab Results  Component Value Date   VD25OH 14.03 (L) 12/22/2021   Low, reminded to start oral replacement  Followup: Return if symptoms worsen or fail to improve.  Cathlean Cower, MD 07/10/2022 12:12 PM [facility]

## 2022-07-08 NOTE — Patient Instructions (Addendum)
You had the steroid shot today  Please take all new medication as prescribed - the antibiotic, prednisone, and cough medicine  Please continue all other medications as before, including the nebulizers and breztri  Please have the pharmacy call with any other refills you may need. - the xanax  Please keep your appointments with your specialists as you may have planned - Pulmonary next Tuesday  Please go to the XRAY Department in the first floor for the x-ray testing  You will be contacted by phone if any changes need to be made immediately.  Otherwise, you will receive a letter about your results with an explanation, but please check with MyChart first.  Please remember to sign up for MyChart if you have not done so, as this will be important to you in the future with finding out test results, communicating by private email, and scheduling acute appointments online when needed.

## 2022-07-10 ENCOUNTER — Encounter: Payer: Self-pay | Admitting: Internal Medicine

## 2022-07-10 NOTE — Assessment & Plan Note (Signed)
Mild to mod, no resp distress of low sat, for depomedrol IM 80 mg, then levaquin course, prednisone taper, and cough med prn, also for CXR r/o pna,  to f/u any worsening symptoms or concerns

## 2022-07-10 NOTE — Assessment & Plan Note (Signed)
Last vitamin D Lab Results  Component Value Date   VD25OH 14.03 (L) 12/22/2021   Low, reminded to start oral replacement

## 2022-07-10 NOTE — Assessment & Plan Note (Signed)
Mild to mod, for xanax refill prn use,  to f/u any worsening symptoms or concerns

## 2022-07-14 ENCOUNTER — Telehealth: Payer: Medicare Other

## 2022-07-21 ENCOUNTER — Encounter: Payer: Self-pay | Admitting: Pulmonary Disease

## 2022-07-21 ENCOUNTER — Ambulatory Visit: Payer: Medicare Other | Admitting: Pulmonary Disease

## 2022-07-21 VITALS — BP 120/80 | HR 67 | Ht 62.0 in | Wt 148.4 lb

## 2022-07-21 DIAGNOSIS — J449 Chronic obstructive pulmonary disease, unspecified: Secondary | ICD-10-CM | POA: Diagnosis not present

## 2022-07-21 DIAGNOSIS — R918 Other nonspecific abnormal finding of lung field: Secondary | ICD-10-CM

## 2022-07-21 DIAGNOSIS — Z87891 Personal history of nicotine dependence: Secondary | ICD-10-CM | POA: Diagnosis not present

## 2022-07-21 DIAGNOSIS — J432 Centrilobular emphysema: Secondary | ICD-10-CM | POA: Diagnosis not present

## 2022-07-21 MED ORDER — AZITHROMYCIN 250 MG PO TABS: 250.0000 mg | ORAL_TABLET | ORAL | 0 refills | Status: DC

## 2022-07-21 MED ORDER — PREDNISONE 10 MG PO TABS: ORAL_TABLET | ORAL | 0 refills | Status: DC

## 2022-07-21 NOTE — Progress Notes (Signed)
Synopsis: Referred in November 2019 for emphysema by Janith Lima, MD  Subjective:   PATIENT ID: Lynn Kline GENDER: female DOB: 1956-05-03, MRN: [id number]  Chief Complaint  Patient presents with   Follow-up    PMH of tobacco abuse, smoked for 60 years, she recently had a CT chest with upper lobe emphysema. She recently found out that her 3 family members on her fathers side died of pulmonary fibrosis. Father with two brothers that died of lung cancer. Father died of melanoma. She is a current smoker, 5 cigs per day. She is really trying to stop. Never had any PFTs before. She gets really short of breath when she walks. If she takes her dog for a walk she will have to stop at the end of street and rest. She has chest tightness and usually can only due a few things for a few minutes and then she has to stop and sit and rest. She makes eye glasses for a living. She lives off her ventolin inhaler.  She has been using her albuterol inhaler 8-12 times per day.  She usually goes through 3-4 albuterol inhalers per month.  OV 10/24/2018: Patient has been doing well since her last visit.  She has been smoke-free for the past 2 days.  She had PFTs completed in today's office visit that revealed an FEV1 postbronchodilator of 1.08 L, 47% predicted an RV of 190%, DLCO 73%.  Patient still describes significant dyspnea on exertion.  She is having trouble getting in from her car to the house.  She does have daily cough and sputum production which is a little bit better.  She does think that the Trelegy has made a big difference with using it each day.  Overall she is a little bit anxious about the new diagnosis of COPD.  Patient denies chest pain or hemoptysis.  OV 02/26/2020: Patient here today for follow-up.  Last seen in our office by Wyn Quaker in April 2020 followed by June 2020 and again in October 2020.  Last office visit by Patricia Nettle in April 2021 telephone visit.  Per this visit she had quit  smoking in March 2021.  Managed for moderate COPD last saw me in January 2020.  She was treated for exacerbation at that time congratulated on quitting smoking.  Treated with prednisone and doxycycline.  Continued recommendations for Trelegy inhaler.  Here today for follow-up after recent exacerbation.  Again today feels short of breath.  She has had significant issues with exertion.  Unfortunately she has occasionally went back to smoke a few times.  But has not smoked in the past week or more.  She is still using her Trelegy but has occasionally not use this to see if she can get by without having an inhaler.  We talked about portance of staying on regular maintenance inhaler.  OV 10/02/2020: This is a 66 year old female here today for follow-up of COPD as well as a recent lung cancer screening CT. Lung cancer screening CT was completed on 09/25/2020. This found a new left upper lobe nodule that was not present previously. The largest diameter of this kind of irregular shaped lesion is 18.6 mm. There is a more central solid component of the lesion that is approximately 1 cm with irregular margins. Overall concerning for potential underlying malignancy. Also has association with severe centrilobular and paraseptal emphysema. From a respiratory standpoint she is still very dyspneic on exertion. Since we have last seen each other she has  had three exacerbations requiring steroids and antibiotics. During that time though she had resorted back to smoking again. However the past couple weeks she states that she has quit smoking again because she "just cannot" do it anymore.  OV 01/18/2021: Patient here today for follow-up after bronchoscopy.  Patient's lung nodules were all negative for malignancy.  Patient here today for follow-up after CT imaging that was completed on 01/15/2021.  Patient's left upper lobe nodules remain stable new small 6 mm nodule within the right middle lobe.  Unfortunately she started smoking  again in between the last time I saw her.  She was down to 3 to 5 cigarettes a day.  She states she has not had a cigarette in the past couple of days.  She does have shortness of breath and wheezing at this time.  Does not like use of dry powder inhaler.  She is been on Trelegy for some time.  Would like to consider movement to a MDI.  Or some alternative besides dry powder.  OV 08/09/2021: Here today for follow-up after recent CT scan of the chest for follow-up of lung nodules.  Recent CT scan of the chest document stability of the upper lobe inflammatory scarring.  Nodules have otherwise dissipated.  Doing well on her triple therapy inhaler regimen, Breztri.  She recently had an exacerbation was put on antibiotics steroids.  Her last dose of steroids is tomorrow.  She feels much better.  She quit smoking over a month ago.  OV 07/21/2022: Here today for follow-up.  Unfortunately she had multiple exacerbations this past year requiring antibiotics and steroids.  She is currently using Breztri twice a day as needed albuterol.  She recently just completed another course of prednisone.  She has cough sputum production tach chest tightness and wheezing    Past Medical History:  Diagnosis Date   Anxiety    Atherosclerosis    Colon polyp, hyperplastic    COPD (chronic obstructive pulmonary disease) ([place])    Diverticulosis    Gallstone    Pneumonia      Family History  Problem Relation Age of Onset   Arthritis Mother    COPD Mother    Emphysema Mother    Stroke Father    Heart disease Father    Cancer Father        MELANOMA   Breast cancer Maternal Grandmother    Colon cancer Neg Hx      Past Surgical History:  Procedure Laterality Date   ABDOMINAL HYSTERECTOMY     BRONCHIAL BIOPSY  11/03/2020   Procedure: BRONCHIAL BIOPSIES;  Surgeon: Garner Nash, DO;  Location: [facility];  Service: Pulmonary;;   BRONCHIAL BRUSHINGS  11/03/2020   Procedure: BRONCHIAL BRUSHINGS;  Surgeon: Garner Nash, DO;  Location: [facility];  Service: Pulmonary;;   BRONCHIAL NEEDLE ASPIRATION BIOPSY  11/03/2020   Procedure: BRONCHIAL NEEDLE ASPIRATION BIOPSIES;  Surgeon: Garner Nash, DO;  Location: [facility];  Service: Pulmonary;;   BRONCHIAL WASHINGS  11/03/2020   Procedure: BRONCHIAL WASHINGS;  Surgeon: Garner Nash, DO;  Location: [facility];  Service: Pulmonary;;   CESAREAN SECTION     CHOLECYSTECTOMY     COLONOSCOPY     FIDUCIAL MARKER PLACEMENT  11/03/2020   Procedure: FIDUCIAL MARKER PLACEMENT;  Surgeon: Garner Nash, DO;  Location: [facility];  Service: Pulmonary;;   VIDEO BRONCHOSCOPY WITH ENDOBRONCHIAL NAVIGATION N/A 11/03/2020   Procedure: VIDEO BRONCHOSCOPY WITH ENDOBRONCHIAL NAVIGATION AND POSSIBLE ULTRASOUND;  Surgeon: Valeta Harms,  Octavio Graves, DO;  Location: [facility];  Service: Pulmonary;  Laterality: N/A;   VIDEO BRONCHOSCOPY WITH ENDOBRONCHIAL ULTRASOUND  11/03/2020   Procedure: VIDEO BRONCHOSCOPY WITH ENDOBRONCHIAL ULTRASOUND;  Surgeon: Garner Nash, DO;  Location: [facility];  Service: Pulmonary;;    Social History   Socioeconomic History   Marital status: Divorced    Spouse name: Not on file   Number of children: Not on file   Years of education: Not on file   Highest education level: Not on file  Occupational History   Not on file  Tobacco Use   Smoking status: Former    Types: Cigarettes    Quit date: 12/15/2021    Years since quitting: 0.5   Smokeless tobacco: Never   Tobacco comments:    Pt states she quit smoking in March 2023. 07/21/2022 [place]  Substance and Sexual Activity   Alcohol use: No    Alcohol/week: 0.0 standard drinks of alcohol   Drug use: No   Sexual activity: Never    Comment: 1ST INTERCOURSE- 16, PARTNERS- 5  Other Topics Concern   Not on file  Social History Narrative   Not on file   Social Determinants of Health   Financial Resource Strain: Low Risk  (01/31/2022)   Overall Financial Resource Strain (CARDIA)     Difficulty of Paying Living Expenses: Not hard at all  Food Insecurity: No Food Insecurity (01/31/2022)   Hunger Vital Sign    Worried About Running Out of Food in the Last Year: Never true    [place] in the Last Year: Never true  Transportation Needs: No Transportation Needs (01/31/2022)   PRAPARE - Hydrologist (Medical): No    Lack of Transportation (Non-Medical): No  Physical Activity: Not on file  Stress: No Stress Concern Present (01/31/2022)   Paint    Feeling of Stress : Not at all  Social Connections: Moderately Integrated (01/31/2022)   Social Connection and Isolation Panel [NHANES]    Frequency of Communication with Friends and Family: More than three times a week    Frequency of Social Gatherings with Friends and Family: More than three times a week    Attends Religious Services: 1 to 4 times per year    Active Member of [employer] or Organizations: Yes    Attends Archivist Meetings: 1 to 4 times per year    Marital Status: Divorced  Human resources officer Violence: Not At Risk (01/31/2022)   Humiliation, Afraid, Rape, and Kick questionnaire    Fear of Current or Ex-Partner: No    Emotionally Abused: No    Physically Abused: No    Sexually Abused: No     Allergies  Allergen Reactions   Codeine Nausea And Vomiting   Erythromycin Nausea And Vomiting   Ampicillin Nausea And Vomiting, Rash and Other (See Comments)    Pt states that rash was on her tongue.  Has patient had a PCN reaction causing immediate rash, facial/tongue/throat swelling, SOB or lightheadedness with hypotension yes Has patient had a PCN reaction causing severe rash involving mucus membranes or skin necrosis: no Has patient had a PCN reaction that required hospitalization yes Has patient had a PCN reaction occurring within the last 10 years: no If all of the above answers are "NO", then may proceed  with Cephalosporin use.      Outpatient Medications Prior to Visit  Medication Sig Dispense Refill  albuterol (PROVENTIL) (2.5 MG/3ML) 0.083% nebulizer solution Take 3 mLs (2.5 mg total) by nebulization every 4 (four) hours as needed for wheezing or shortness of breath. 75 mL 5   benzonatate (TESSALON) 100 MG capsule Take 1 capsule (100 mg total) by mouth 2 (two) times daily as needed for cough. 28 capsule 0   Budeson-Glycopyrrol-Formoterol (BREZTRI AEROSPHERE) 160-9-4.8 MCG/ACT AERO Inhale 2 puffs into the lungs in the morning and at bedtime. 32.1 g 3   VENTOLIN HFA 108 (90 Base) MCG/ACT inhaler INHALE 1 TO 2 PUFFS INTO THE LUNGS EVERY 6 HOURS AS NEEDED FOR WHEEZING OR SHORTNESS OF BREATH 18 g 3   ALPRAZolam (XANAX) 0.25 MG tablet 1 tab yb mouth twice per day as needed 60 tablet 2   Calcium Carbonate-Vitamin D (CALCIUM PLUS VITAMIN D PO) Take 1 tablet by mouth daily. '1200mg'$  calcium and 1000 units vitamin D     predniSONE (DELTASONE) 10 MG tablet 3 tabs by mouth per day for 3 days,2tabs per day for 3 days,1tab per day for 3 days 18 tablet 0   No facility-administered medications prior to visit.    Review of Systems  Constitutional:  Negative for chills, fever, malaise/fatigue and weight loss.  HENT:  Negative for hearing loss, sore throat and tinnitus.   Eyes:  Negative for blurred vision and double vision.  Respiratory:  Positive for cough, sputum production, shortness of breath and wheezing. Negative for hemoptysis and stridor.   Cardiovascular:  Negative for chest pain, palpitations, orthopnea, leg swelling and PND.  Gastrointestinal:  Negative for abdominal pain, constipation, diarrhea, heartburn, nausea and vomiting.  Genitourinary:  Negative for dysuria, hematuria and urgency.  Musculoskeletal:  Negative for joint pain and myalgias.  Skin:  Negative for itching and rash.  Neurological:  Negative for dizziness, tingling, weakness and headaches.  Endo/Heme/Allergies:  Negative for  environmental allergies. Does not bruise/bleed easily.  Psychiatric/Behavioral:  Negative for depression. The patient is not nervous/anxious and does not have insomnia.   All other systems reviewed and are negative.    Objective:  Physical Exam Vitals reviewed.  Constitutional:      General: She is not in acute distress.    Appearance: She is well-developed.  HENT:     Head: Normocephalic and atraumatic.     Mouth/Throat:     Pharynx: No oropharyngeal exudate.  Eyes:     Conjunctiva/sclera: Conjunctivae normal.     Pupils: Pupils are equal, round, and reactive to light.  Neck:     Vascular: No JVD.     Trachea: No tracheal deviation.     Comments: Loss of supraclavicular fat Cardiovascular:     Rate and Rhythm: Normal rate and regular rhythm.     Heart sounds: S1 normal and S2 normal.     Comments: Distant heart tones Pulmonary:     Effort: No tachypnea or accessory muscle usage.     Breath sounds: No stridor. Decreased breath sounds (throughout all lung fields) and wheezing present. No rhonchi or rales.     Comments: Wheezing worse on the right and the left Abdominal:     General: Bowel sounds are normal. There is no distension.     Palpations: Abdomen is soft.     Tenderness: There is no abdominal tenderness.  Musculoskeletal:        General: Deformity (muscle wasting ) present.  Skin:    General: Skin is warm and dry.     Capillary Refill: Capillary refill takes less than 2 seconds.  Findings: No rash.  Neurological:     Mental Status: She is alert and oriented to person, place, and time.  Psychiatric:        Behavior: Behavior normal.      Vitals:   07/21/22 0838  BP: 120/80  Pulse: 67  SpO2: 95%  Weight: 148 lb 6.4 oz (67.3 kg)  Height: '5\' 2"'$  (1.575 m)   95% on RA BMI Readings from Last 3 Encounters:  07/21/22 27.14 kg/m  07/08/22 27.07 kg/m  01/31/22 26.16 kg/m   Wt Readings from Last 3 Encounters:  07/21/22 148 lb 6.4 oz (67.3 kg)   07/08/22 148 lb (67.1 kg)  01/31/22 143 lb (64.9 kg)     CBC    Component Value Date/Time   WBC 9.5 12/22/2021 1500   RBC 4.60 12/22/2021 1500   HGB 14.2 12/22/2021 1500   HCT 42.8 12/22/2021 1500   PLT 306.0 12/22/2021 1500   MCV 93.0 12/22/2021 1500   MCH 30.8 11/22/2016 0832   MCHC 33.3 12/22/2021 1500   RDW 13.9 12/22/2021 1500   LYMPHSABS 2.5 12/22/2021 1500   MONOABS 0.5 12/22/2021 1500   EOSABS 0.0 12/22/2021 1500   BASOSABS 0.1 12/22/2021 1500    Chest Imaging: 07/24/2018 -CT chest with evidence of mild upper lobe predominant emphysematous change..  01/15/2021: CT chest stable left upper lobe nodule, 6 mm right middle lobe nodule. The patient's images have been independently reviewed by me.    October 2022: CT scan of the chest reveals stability of the left upper lobe scarring.  Lung nodules have somewhat dissipated. The patient's images have been independently reviewed by me.    Chest x-ray 07/08/2022: No infiltrate no pneumonia. Evidence of hyperinflation. The patient's images have been independently reviewed by me.    Pulmonary Functions Testing Results:    Latest Ref Rng & Units 10/24/2018   11:36 AM  PFT Results  FVC-Pre L 2.22   FVC-Predicted Pre % 74   FVC-Post L 2.15   FVC-Predicted Post % 72   Pre FEV1/FVC % % 52   Post FEV1/FCV % % 50   FEV1-Pre L 1.17   FEV1-Predicted Pre % 51   FEV1-Post L 1.08   DLCO uncorrected ml/min/mmHg 15.91   DLCO UNC% % 73   DLVA Predicted % 83   TLC L 6.19   TLC % Predicted % 130   RV % Predicted % 190     FeNO: None   Pathology:   Bronchscopy: LUL nodules, negative formalignancy   Echocardiogram: None   Heart Catheterization: None    Assessment & Plan:   Centrilobular emphysema (HCC) - Plan: EKG 12-Lead  COPD, severe (HCC) - Plan: EKG 12-Lead  Lung nodules  Former smoker  Discussion:  This is a 66 year old female, severe COPD FEV1 of 1.08 L, 47% predicted, abnormal lung cancer screening CT in  the past prior evaluation with bronchoscopy with nondiagnostic nodules negative for malignancy.  Follow-up CT has showed dissipation of the nodules suggestive of more of inflammatory etiology.  She has had recurrent shortness of breath.  She had multiple COPD exacerbations this year requiring antibiotics and steroids.  Plan: ECG today in the office Azithromycin 250 mg Monday Wednesday Friday We will need a repeat ECG at next office visit in 4 weeks. Continue triple therapy inhaler regimen with Breztri Continue to encourage smoking cessation. Repeat CT scan of the chest without contrast prior to next office visit.  Return to clinic with APP for repeat ECG in 4  weeks.    Current Outpatient Medications:    albuterol (PROVENTIL) (2.5 MG/3ML) 0.083% nebulizer solution, Take 3 mLs (2.5 mg total) by nebulization every 4 (four) hours as needed for wheezing or shortness of breath., Disp: 75 mL, Rfl: 5   benzonatate (TESSALON) 100 MG capsule, Take 1 capsule (100 mg total) by mouth 2 (two) times daily as needed for cough., Disp: 28 capsule, Rfl: 0   Budeson-Glycopyrrol-Formoterol (BREZTRI AEROSPHERE) 160-9-4.8 MCG/ACT AERO, Inhale 2 puffs into the lungs in the morning and at bedtime., Disp: 32.1 g, Rfl: 3   VENTOLIN HFA 108 (90 Base) MCG/ACT inhaler, INHALE 1 TO 2 PUFFS INTO THE LUNGS EVERY 6 HOURS AS NEEDED FOR WHEEZING OR SHORTNESS OF BREATH, Disp: 18 g, Rfl: 3   ALPRAZolam (XANAX) 0.25 MG tablet, 1 tab yb mouth twice per day as needed, Disp: 60 tablet, Rfl: 2   Calcium Carbonate-Vitamin D (CALCIUM PLUS VITAMIN D PO), Take 1 tablet by mouth daily. '1200mg'$  calcium and 1000 units vitamin D, Disp: , Rfl:    predniSONE (DELTASONE) 10 MG tablet, 3 tabs by mouth per day for 3 days,2tabs per day for 3 days,1tab per day for 3 days, Disp: 18 tablet, Rfl: 0    Garner Nash, DO [facility] 07/21/2022 8:49 AM

## 2022-07-21 NOTE — Patient Instructions (Signed)
Thank you for visiting Dr. Valeta Harms at [facility]. Today we recommend the following:  Orders Placed This Encounter  Procedures   EKG 12-Lead   Meds ordered this encounter  Medications   azithromycin (ZITHROMAX) 250 MG tablet    Sig: Take 1 tablet (250 mg total) by mouth every Monday, Wednesday, and Friday.    Dispense:  36 tablet    Refill:  0   predniSONE (DELTASONE) 10 MG tablet    Sig: Take 4 tabs by mouth once daily x4 days, then 3 tabs x4 days, 2 tabs x4 days, 1 tab x4 days and stop.    Dispense:  40 tablet    Refill:  0   Return in about 4 weeks (around 08/18/2022) for with APP. Needs repeat ECG at next office visit     Please do your part to reduce the spread of COVID-19.

## 2022-08-06 ENCOUNTER — Ambulatory Visit (HOSPITAL_BASED_OUTPATIENT_CLINIC_OR_DEPARTMENT_OTHER)
Admission: RE | Admit: 2022-08-06 | Discharge: 2022-08-06 | Disposition: A | Discharge status: Home / Self Care | Payer: Medicare Other | Source: Ambulatory Visit | Attending: Pulmonary Disease | Admitting: Pulmonary Disease

## 2022-08-06 DIAGNOSIS — J432 Centrilobular emphysema: Secondary | ICD-10-CM | POA: Insufficient documentation

## 2022-08-06 DIAGNOSIS — R918 Other nonspecific abnormal finding of lung field: Secondary | ICD-10-CM | POA: Diagnosis not present

## 2022-08-06 DIAGNOSIS — J449 Chronic obstructive pulmonary disease, unspecified: Secondary | ICD-10-CM | POA: Insufficient documentation

## 2022-08-06 DIAGNOSIS — J439 Emphysema, unspecified: Secondary | ICD-10-CM | POA: Diagnosis not present

## 2022-08-14 ENCOUNTER — Other Ambulatory Visit: Payer: Self-pay | Admitting: Internal Medicine

## 2022-08-14 DIAGNOSIS — J432 Centrilobular emphysema: Secondary | ICD-10-CM

## 2022-08-14 DIAGNOSIS — J441 Chronic obstructive pulmonary disease with (acute) exacerbation: Secondary | ICD-10-CM

## 2022-08-14 DIAGNOSIS — R0609 Other forms of dyspnea: Secondary | ICD-10-CM

## 2022-08-18 ENCOUNTER — Encounter: Payer: Self-pay | Admitting: Nurse Practitioner

## 2022-08-18 ENCOUNTER — Ambulatory Visit (INDEPENDENT_AMBULATORY_CARE_PROVIDER_SITE_OTHER): Payer: Medicare Other | Admitting: Nurse Practitioner

## 2022-08-18 VITALS — BP 132/80 | HR 87 | Ht 62.0 in | Wt 149.4 lb

## 2022-08-18 DIAGNOSIS — R911 Solitary pulmonary nodule: Secondary | ICD-10-CM

## 2022-08-18 DIAGNOSIS — J439 Emphysema, unspecified: Secondary | ICD-10-CM | POA: Diagnosis not present

## 2022-08-18 DIAGNOSIS — J432 Centrilobular emphysema: Secondary | ICD-10-CM

## 2022-08-18 DIAGNOSIS — J4489 Other specified chronic obstructive pulmonary disease: Secondary | ICD-10-CM | POA: Diagnosis not present

## 2022-08-18 DIAGNOSIS — Z87891 Personal history of nicotine dependence: Secondary | ICD-10-CM | POA: Diagnosis not present

## 2022-08-18 LAB — COMPREHENSIVE METABOLIC PANEL
ALT: 19 U/L (ref 0–35)
AST: 20 U/L (ref 0–37)
Albumin: 4 g/dL (ref 3.5–5.2)
Alkaline Phosphatase: 57 U/L (ref 39–117)
BUN: 15 mg/dL (ref 6–23)
CO2: 35 mEq/L — ABNORMAL HIGH (ref 19–32)
Calcium: 9.4 mg/dL (ref 8.4–10.5)
Chloride: 104 mEq/L (ref 96–112)
Creatinine, Ser: 0.64 mg/dL (ref 0.40–1.20)
GFR: 91.54 mL/min (ref >60.00)
Glucose, Bld: 101 mg/dL — ABNORMAL HIGH (ref 70–99)
Potassium: 4.1 mEq/L (ref 3.5–5.1)
Sodium: 143 mEq/L (ref 135–145)
Total Bilirubin: 0.4 mg/dL (ref 0.2–1.2)
Total Protein: 6.4 g/dL (ref 6.0–8.3)

## 2022-08-18 MED ORDER — ROFLUMILAST 500 MCG PO TABS: ORAL_TABLET | ORAL | 11 refills | Status: DC

## 2022-08-18 NOTE — Assessment & Plan Note (Signed)
Stable 3 mm nodule in RLL. She has had resolution of previous nodules of concern and negative bronchoscopy. We will refer her back to the lung cancer screening program.

## 2022-08-18 NOTE — Progress Notes (Signed)
$'@Patient'p$  ID: Lynn Kline, female    DOB: [birth year], 66 y.o.   MRN: [id number]  Chief Complaint  Patient presents with   Follow-up    Pt f/u per BI to discuss CT scan results. Still having SOB and wheezing.     Referring provider: Janith Lima, MD  HPI: 66 year old female, former smoker followed for severe COPD and lung nodule. She had an abnormal lung cancer screening CT in 2021 followed by bronchoscopy with non-diagnostic nodules and negative for malignancy. She is a patient of Lynn Kline and last seen in office 07/21/2022. Past medical history significant for CAD, IBS, anxiety, HLD.  TEST/EVENTS:  10/24/2018 PFT: FVC 74, FEV1 51, ratio 50, TLC 130, DLCOunc 73 08/06/2022 CT chest: there is a trace pericardial effusion. Emphysematous changes with apical pleural scarring. Bronchial wall thickening is present bilaterally. Stable scarring and metallic markers of the LUL. There is a 3 mm nodule in RLL, unchanged.   07/20/2022: OV with Lynn Kline. Multiple recent exacerbations requiring steroids and abx. Using breztri twice daily. Just completed another course of prednisone. She has cough, sputum production, chest tightness, wheezing. Started on azithromycin M/W/F. Plan to repeat ECG in 4 weeks. Repeat CT chest for evaluation of nodules ordered  08/18/2022: Today - follow up Patient presents today for follow up. She has been doing better since she was here last; still with daily productive cough and chest congestion. She feels like the azithromycin makes her feel bad and causes her heart to race. She's noticed that since she started it, it feels like her heart pounds every time she gets up to move. She doesn't want to continue on it right now. She denies any lightheadedness, dizziness or chest pain. Her breathing is stable. No increased wheezing. She is using Breztri twice daily.   Allergies  Allergen Reactions   Codeine Nausea And Vomiting   Erythromycin Nausea And Vomiting   Ampicillin  Nausea And Vomiting, Rash and Other (See Comments)    Pt states that rash was on her tongue.  Has patient had a PCN reaction causing immediate rash, facial/tongue/throat swelling, SOB or lightheadedness with hypotension yes Has patient had a PCN reaction causing severe rash involving mucus membranes or skin necrosis: no Has patient had a PCN reaction that required hospitalization yes Has patient had a PCN reaction occurring within the last 10 years: no If all of the above answers are "NO", then may proceed with Cephalosporin use.     Immunization History  Administered Date(s) Administered   DTaP 06/14/2005   Fluad Quad(high Dose 65+) 07/04/2020, 07/14/2021   Influenza Whole 10/23/2011   Influenza,inj,Quad PF,6+ Mos 08/19/2014, 10/24/2018, 08/01/2019   PFIZER(Purple Top)SARS-COV-2 Vaccination 07/04/2020, 07/25/2020   Pneumococcal Conjugate-13 09/14/2005   Pneumococcal Polysaccharide-23 08/19/2014, 12/22/2021   Tdap 09/22/2013    Past Medical History:  Diagnosis Date   Anxiety    Atherosclerosis    Colon polyp, hyperplastic    COPD (chronic obstructive pulmonary disease) ([place])    Diverticulosis    Gallstone    Pneumonia     Tobacco History: Social History   Tobacco Use  Smoking Status Former   Types: Cigarettes   Quit date: 12/15/2021   Years since quitting: 0.6  Smokeless Tobacco Never  Tobacco Comments   Pt states she quit smoking in March 2023. 07/21/2022 Lynn Kline   Counseling given: Not Answered Tobacco comments: Pt states she quit smoking in March 2023. 07/21/2022 Lynn Kline   Outpatient Medications Prior to Visit  Medication  Sig Dispense Refill   albuterol (PROVENTIL) (2.5 MG/3ML) 0.083% nebulizer solution TAKE 3 MLS BY NEBULIZATION EVERY 4 HOURS AS NEEDED FOR WHEEZING OR SHORTNESS OF BREATH 75 mL 5   ALPRAZolam (XANAX) 0.25 MG tablet 1 tab yb mouth twice per day as needed 60 tablet 2   Budeson-Glycopyrrol-Formoterol (BREZTRI AEROSPHERE) 160-9-4.8 MCG/ACT AERO Inhale 2 puffs  into the lungs in the morning and at bedtime. 32.1 g 3   Calcium Carbonate-Vitamin D (CALCIUM PLUS VITAMIN D PO) Take 1 tablet by mouth daily. '1200mg'$  calcium and 1000 units vitamin D     VENTOLIN HFA 108 (90 Base) MCG/ACT inhaler INHALE 1 TO 2 PUFFS INTO THE LUNGS EVERY 6 HOURS AS NEEDED FOR WHEEZING OR SHORTNESS OF BREATH 18 g 3   azithromycin (ZITHROMAX) 250 MG tablet Take 1 tablet (250 mg total) by mouth every Monday, Wednesday, and Friday. 36 tablet 0   benzonatate (TESSALON) 100 MG capsule Take 1 capsule (100 mg total) by mouth 2 (two) times daily as needed for cough. (Patient not taking: Reported on 08/18/2022) 28 capsule 0   predniSONE (DELTASONE) 10 MG tablet 3 tabs by mouth per day for 3 days,2tabs per day for 3 days,1tab per day for 3 days (Patient not taking: Reported on 08/18/2022) 18 tablet 0   predniSONE (DELTASONE) 10 MG tablet Take 4 tabs by mouth once daily x4 days, then 3 tabs x4 days, 2 tabs x4 days, 1 tab x4 days and stop. (Patient not taking: Reported on 08/18/2022) 40 tablet 0   No facility-administered medications prior to visit.     Review of Systems:   Constitutional: No weight loss or gain, night sweats, fevers, chills, fatigue, or lassitude. HEENT: No headaches, difficulty swallowing, tooth/dental problems, or sore throat. No sneezing, itching, ear ache, nasal congestion, or post nasal drip CV:  No chest pain, orthopnea, PND, swelling in lower extremities, anasarca, dizziness, palpitations, syncope Resp: +shortness of breath with exertion (baseline); daily productive cough; chest congestion. No excess mucus or change in color of mucus. No hemoptysis. No wheezing.  No chest wall deformity GI:  No heartburn, indigestion, abdominal pain, nausea, vomiting, diarrhea, change in bowel habits, loss of appetite, bloody stools.  MSK:  No joint pain or swelling.  No decreased range of motion.  No back pain. Neuro: No dizziness or lightheadedness.  Psych: No depression or anxiety.  Mood stable.     Physical Exam:  BP 132/80   Pulse 87   Ht '5\' 2"'$  (1.575 m)   Wt 149 lb 6.4 oz (67.8 kg)   SpO2 95% Comment: RA  BMI 27.33 kg/m   GEN: Pleasant, interactive, well-appearing; in no acute distress. HEENT:  Normocephalic and atraumatic. PERRLA. Sclera white. Nasal turbinates pink, moist and patent bilaterally. No rhinorrhea present. Oropharynx pink and moist, without exudate or edema. No lesions, ulcerations, or postnasal drip.  NECK:  Supple w/ fair ROM. No JVD present. Normal carotid impulses w/o bruits. Thyroid symmetrical with no goiter or nodules palpated. No lymphadenopathy.   CV: RRR, no m/r/g, no peripheral edema. Pulses intact, +2 bilaterally. No cyanosis, pallor or clubbing. PULMONARY:  Unlabored, regular breathing. Diminished bilaterally A&P w/o wheezes/rales/rhonchi. No accessory muscle use.  GI: BS present and normoactive. Soft, non-tender to palpation. No organomegaly or masses detected.  MSK: No erythema, warmth or tenderness. No deformities or joint swelling noted.  Neuro: A/Ox3. No focal deficits noted.   Skin: Warm, no lesions or rashe Psych: Normal affect and behavior. Judgement and thought content appropriate.  Lab Results:  CBC    Component Value Date/Time   WBC 9.5 12/22/2021 1500   RBC 4.60 12/22/2021 1500   HGB 14.2 12/22/2021 1500   HCT 42.8 12/22/2021 1500   PLT 306.0 12/22/2021 1500   MCV 93.0 12/22/2021 1500   MCH 30.8 11/22/2016 0832   MCHC 33.3 12/22/2021 1500   RDW 13.9 12/22/2021 1500   LYMPHSABS 2.5 12/22/2021 1500   MONOABS 0.5 12/22/2021 1500   EOSABS 0.0 12/22/2021 1500   BASOSABS 0.1 12/22/2021 1500    BMET    Component Value Date/Time   NA 139 12/22/2021 1500   K 4.0 12/22/2021 1500   CL 100 12/22/2021 1500   CO2 32 12/22/2021 1500   GLUCOSE 97 12/22/2021 1500   BUN 21 12/22/2021 1500   CREATININE 0.65 12/22/2021 1500   CALCIUM 9.3 12/22/2021 1500   GFRNONAA >60 11/22/2016 0832   GFRAA >60 11/22/2016 0832     BNP    Component Value Date/Time   BNP 34.1 11/22/2016 0832     Imaging:  CT Chest Wo Contrast  Result Date: 08/09/2022 CLINICAL DATA:  Dyspnea, chronic, unclear etiology. Emphysema, lung nodule. EXAM: CT CHEST WITHOUT CONTRAST TECHNIQUE: Multidetector CT imaging of the chest was performed following the standard protocol without IV contrast. RADIATION DOSE REDUCTION: This exam was performed according to the departmental dose-optimization program which includes automated exposure control, adjustment of the mA and/or kV according to patient size and/or use of iterative reconstruction technique. COMPARISON:  07/19/2021, 01/15/2021. FINDINGS: Cardiovascular: The heart is normal in size and there is a trace pericardial effusion. Scattered coronary artery calcifications are noted. There is atherosclerotic calcification of the aorta without evidence of aneurysm. The pulmonary trunk is normal in caliber. Mediastinum/Nodes: No mediastinal or axillary lymphadenopathy. Evaluation of the hila is limited due to lack of IV contrast. Lungs/Pleura: Emphysematous changes with apical pleural scarring is noted bilaterally. Bronchial wall thickening is present bilaterally. No effusion or pneumothorax. Stable scarring and metallic markers are present in the posterior segment of the left upper lobe. There is a 3 mm nodule in the right lower lobe, axial image 86, unchanged from 01/15/2021 and not well seen on most recent exam. No new nodule is identified. Upper Abdomen: The gallbladder is surgically absent. No acute abnormality. Musculoskeletal: Degenerative changes in the thoracic spine. A stable compression deformity is present in the midthoracic spine at T7. No acute or suspicious osseous abnormality. IMPRESSION: 1. Stable scarring in the posterior segment of the left upper lobe and stable right lower lobe pulmonary nodule measuring 3 mm. No follow-up recommended. This recommendation follows the consensus statement:  Guidelines for Management of Incidental Pulmonary Nodules Detected on CT Images: From the Fleischner Society 2017; Radiology 2017; 284:228-243. 2. Emphysema. 3. Scattered coronary artery calcifications. 4. Aortic atherosclerosis. Electronically Signed   By: Brett Fairy M.D.   On: 08/09/2022 00:39    methylPREDNISolone acetate (DEPO-MEDROL) injection 80 mg     Date Action Dose Route User   07/08/2022 1625 Given 80 mg Intramuscular (Left Ventrogluteal) Max Sane, CMA          Latest Ref Rng & Units 10/24/2018   11:36 AM  PFT Results  FVC-Pre L 2.22   FVC-Predicted Pre % 74   FVC-Post L 2.15   FVC-Predicted Post % 72   Pre FEV1/FVC % % 52   Post FEV1/FCV % % 50   FEV1-Pre L 1.17   FEV1-Predicted Pre % 51   FEV1-Post L 1.08   DLCO uncorrected  ml/min/mmHg 15.91   DLCO [facility]% % 73   DLVA Predicted % 83   TLC L 6.19   TLC % Predicted % 130   RV % Predicted % 190     No results found for: "NITRICOXIDE"      Assessment & Plan:   COPD with chronic bronchitis and emphysema (HCC) Severe COPD with chronic bronchitis and emphysema. Struggles with daily productive cough and chest congestion. Recently with frequent exacerbations. Not tolerating azithromycin well. ECG today was unchanged from prior. Shared decision to stop azithromycin and trial her on Daliresp. Reviewed risks/benefits. Monitor liver function. Encouraged her to use mucinex for mucolytic therapy. Continue maintenance regimen of Breztri.   Patient Instructions  Continue Breztri 2 puffs Twice daily. Brush tongue and rinse mouth afterwards Continue Albuterol inhaler 2 puffs or 3 mL neb every 6 hours as needed for shortness of breath or wheezing. Notify if symptoms persist despite rescue inhaler/neb use. Use your neb 2-4 times a day if you have worsening chest congestion, shortness of breath or cough  Stop azithromycin   Start roflumilast (daliresp) 250 mcg (1/2 tab) daily for four weeks then increase to 500 mcg daily  (1 tab). This will be your maintenance dose  Mucinex [phone] mg Twice daily for chest congestion   Referral to lung cancer screening program   Labs today -CMET  Follow up in 4 weeks to see how daliresp is going with Lynn Kline or Joellen Jersey Geena Weinhold,NP. If symptoms do not improve or worsen, please contact office for sooner follow up or seek emergency care.    Lung nodule Stable 3 mm nodule in RLL. She has had resolution of previous nodules of concern and negative bronchoscopy. We will refer her back to the lung cancer screening program.    I spent 38 minutes of dedicated to the care of this patient on the date of this encounter to include pre-visit review of records, face-to-face time with the patient discussing conditions above, post visit ordering of testing, clinical documentation with the electronic health record, making appropriate referrals as documented, and communicating necessary findings to members of the patients care team.  Clayton Bibles, NP 08/18/2022  Pt aware and understands NP's role.

## 2022-08-18 NOTE — Assessment & Plan Note (Addendum)
Severe COPD with chronic bronchitis and emphysema. Struggles with daily productive cough and chest congestion. Recently with frequent exacerbations. Not tolerating azithromycin well. ECG today was unchanged from prior. Shared decision to stop azithromycin and trial her on Daliresp. Reviewed risks/benefits. Monitor liver function. Encouraged her to use mucinex for mucolytic therapy. Continue maintenance regimen of Breztri.   Patient Instructions  Continue Breztri 2 puffs Twice daily. Brush tongue and rinse mouth afterwards Continue Albuterol inhaler 2 puffs or 3 mL neb every 6 hours as needed for shortness of breath or wheezing. Notify if symptoms persist despite rescue inhaler/neb use. Use your neb 2-4 times a day if you have worsening chest congestion, shortness of breath or cough  Stop azithromycin   Start roflumilast (daliresp) 250 mcg (1/2 tab) daily for four weeks then increase to 500 mcg daily (1 tab). This will be your maintenance dose  Mucinex [phone] mg Twice daily for chest congestion   Referral to lung cancer screening program   Labs today -CMET  Follow up in 4 weeks to see how daliresp is going with Dr. Valeta Harms or Joellen Jersey Denishia Citro,NP. If symptoms do not improve or worsen, please contact office for sooner follow up or seek emergency care.

## 2022-08-18 NOTE — Patient Instructions (Addendum)
Continue Breztri 2 puffs Twice daily. Brush tongue and rinse mouth afterwards Continue Albuterol inhaler 2 puffs or 3 mL neb every 6 hours as needed for shortness of breath or wheezing. Notify if symptoms persist despite rescue inhaler/neb use. Use your neb 2-4 times a day if you have worsening chest congestion, shortness of breath or cough  Stop azithromycin   Start roflumilast (daliresp) 250 mcg (1/2 tab) daily for four weeks then increase to 500 mcg daily (1 tab). This will be your maintenance dose  Mucinex [phone] mg Twice daily for chest congestion   Referral to lung cancer screening program   Labs today -CMET  Follow up in 4 weeks to see how daliresp is going with Dr. Valeta Harms or Lynn Jersey Darcee Dekker,NP. If symptoms do not improve or worsen, please contact office for sooner follow up or seek emergency care.

## 2022-08-31 ENCOUNTER — Telehealth: Payer: Self-pay | Admitting: *Deleted

## 2022-08-31 NOTE — Telephone Encounter (Signed)
Left message for patient to call back to schedule follow up lung cancer screening CT scan.  

## 2022-09-05 NOTE — Progress Notes (Signed)
Labs nl aside from slight increased in CO2, related to her COPD. Thanks.

## 2022-09-15 ENCOUNTER — Encounter: Payer: Self-pay | Admitting: Nurse Practitioner

## 2022-09-15 ENCOUNTER — Ambulatory Visit (INDEPENDENT_AMBULATORY_CARE_PROVIDER_SITE_OTHER): Payer: Medicare Other | Admitting: Nurse Practitioner

## 2022-09-15 VITALS — BP 112/78 | HR 87 | Ht 62.0 in | Wt 147.8 lb

## 2022-09-15 DIAGNOSIS — J4489 Other specified chronic obstructive pulmonary disease: Secondary | ICD-10-CM

## 2022-09-15 DIAGNOSIS — R911 Solitary pulmonary nodule: Secondary | ICD-10-CM

## 2022-09-15 DIAGNOSIS — J439 Emphysema, unspecified: Secondary | ICD-10-CM

## 2022-09-15 NOTE — Progress Notes (Signed)
$'@Patient'C$  ID: Lynn Kline, female    DOB: 08-May-1956, 66 y.o.   MRN: [id number]  Chief Complaint  Patient presents with   Follow-up    Pt f/u, she is states she is doing well but wants to discuss her use of daliresp    Referring provider: Janith Lima, MD  HPI: 66 year old female, former smoker followed for severe COPD and lung nodule. She had an abnormal lung cancer screening CT in 2021 followed by bronchoscopy with non-diagnostic nodules and negative for malignancy. She is a patient of Dr. Juline Kline and last seen in office 08/18/2022 by Lynn Kline. Past medical history significant for CAD, IBS, anxiety, HLD.  TEST/EVENTS:  10/24/2018 PFT: FVC 74, FEV1 51, ratio 50, TLC 130, DLCOunc 73 08/06/2022 CT chest: there is a trace pericardial effusion. Emphysematous changes with apical pleural scarring. Bronchial wall thickening is present bilaterally. Stable scarring and metallic markers of the LUL. There is a 3 mm nodule in RLL, unchanged.   07/20/2022: OV with Dr. Valeta Kline. Multiple recent exacerbations requiring steroids and abx. Using breztri twice daily. Just completed another course of prednisone. She has cough, sputum production, chest tightness, wheezing. Started on azithromycin M/W/F. Plan to repeat ECG in 4 weeks. Repeat CT chest for evaluation of nodules ordered  08/18/2022: OV with Lynn Kline for follow up. She has been doing better since she was here last; still with daily productive cough and chest congestion. She feels like the azithromycin makes her feel bad and causes her heart to race. She's noticed that since she started it, it feels like her heart pounds every time she gets up to move. She doesn't want to continue on it right now. She denies any lightheadedness, dizziness or chest pain. Her breathing is stable. No increased wheezing. She is using Breztri twice daily. D/c azithromycin. Started her on daliresp.   09/15/2022: Today - follow up Patient presents today for follow up after  being started on daliresp. She had trouble tolerating the daliresp due to loose stools and upset stomach, even with the 250 mcg dose. She has since stopped it. Breathing has been stable for the last few weeks. She did purchase a portable neb machine, which she feels like has helped because she can use it while she's at work. 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 She does take frequent breaks outside and her boss has started working on cleaning out the supply room, so she hopes this will help. No increased cough or chest congestion. She continues on [place] twice daily. Using her nebs 1-3 times a day.   Allergies  Allergen Reactions   Codeine Nausea And Vomiting   Erythromycin Nausea And Vomiting   Ampicillin Nausea And Vomiting, Rash and Other (See Comments)    Pt states that rash was on her tongue.  Has patient had a PCN reaction causing immediate rash, facial/tongue/throat swelling, SOB or lightheadedness with hypotension yes Has patient had a PCN reaction causing severe rash involving mucus membranes or skin necrosis: no Has patient had a PCN reaction that required hospitalization yes Has patient had a PCN reaction occurring within the last 10 years: no If all of the above answers are "NO", then may proceed with Cephalosporin use.     Immunization History  Administered Date(s) Administered   DTaP 06/14/2005   Fluad Quad(high Dose 65+) 07/04/2020, 07/14/2021   Influenza Whole 10/23/2011   Influenza,inj,Quad PF,6+ Mos  08/19/2014, 10/24/2018, 08/01/2019   PFIZER(Purple Top)SARS-COV-2 Vaccination 07/04/2020, 07/25/2020   Pneumococcal Conjugate-13 09/14/2005   Pneumococcal Polysaccharide-23 08/19/2014, 12/22/2021   Tdap 09/22/2013    Past Medical History:  Diagnosis Date   Anxiety    Atherosclerosis    Colon polyp, hyperplastic    COPD (chronic obstructive pulmonary disease) ([place])    Diverticulosis     Gallstone    Pneumonia     Tobacco History: Social History   Tobacco Use  Smoking Status Former   Types: Cigarettes   Quit date: 12/15/2021   Years since quitting: 0.7  Smokeless Tobacco Never  Tobacco Comments   Pt states she quit smoking in March 2023. 07/21/2022 Lynn Kline   Counseling given: Not Answered Tobacco comments: Pt states she quit smoking in March 2023. 07/21/2022 Lynn Kline   Outpatient Medications Prior to Visit  Medication Sig Dispense Refill   albuterol (PROVENTIL) (2.5 MG/3ML) 0.083% nebulizer solution TAKE 3 MLS BY NEBULIZATION EVERY 4 HOURS AS NEEDED FOR WHEEZING OR SHORTNESS OF BREATH 75 mL 5   ALPRAZolam (XANAX) 0.25 MG tablet 1 tab yb mouth twice per day as needed 60 tablet 2   Budeson-Glycopyrrol-Formoterol (BREZTRI AEROSPHERE) 160-9-4.8 MCG/ACT AERO Inhale 2 puffs into the lungs in the morning and at bedtime. 32.1 g 3   Calcium Carbonate-Vitamin D (CALCIUM PLUS VITAMIN D PO) Take 1 tablet by mouth daily. '1200mg'$  calcium and 1000 units vitamin D     VENTOLIN HFA 108 (90 Base) MCG/ACT inhaler INHALE 1 TO 2 PUFFS INTO THE LUNGS EVERY 6 HOURS AS NEEDED FOR WHEEZING OR SHORTNESS OF BREATH 18 g 3   benzonatate (TESSALON) 100 MG capsule Take 1 capsule (100 mg total) by mouth 2 (two) times daily as needed for cough. (Patient not taking: Reported on 08/18/2022) 28 capsule 0   predniSONE (DELTASONE) 10 MG tablet 3 tabs by mouth per day for 3 days,2tabs per day for 3 days,1tab per day for 3 days (Patient not taking: Reported on 08/18/2022) 18 tablet 0   predniSONE (DELTASONE) 10 MG tablet Take 4 tabs by mouth once daily x4 days, then 3 tabs x4 days, 2 tabs x4 days, 1 tab x4 days and stop. (Patient not taking: Reported on 08/18/2022) 40 tablet 0   roflumilast (DALIRESP) 500 MCG TABS tablet Take 0.5 tablets (250 mcg total) by mouth daily for 28 days, THEN 1 tablet (500 mcg total) daily. (Patient not taking: Reported on 09/15/2022) 30 tablet 11   No facility-administered medications prior to  visit.     Review of Systems:   Constitutional: No weight loss or gain, night sweats, fevers, chills, fatigue, or lassitude. HEENT: No headaches, difficulty swallowing, tooth/dental problems, or sore throat. No sneezing, itching, ear ache, nasal congestion, or post nasal drip CV:  No chest pain, orthopnea, PND, swelling in lower extremities, anasarca, dizziness, palpitations, syncope Resp: +shortness of breath with exertion (baseline); daily productive cough; chest congestion. No excess mucus or change in color of mucus. No hemoptysis. No wheezing.  No chest wall deformity GI:  No heartburn, indigestion, abdominal pain, nausea, vomiting, diarrhea, change in bowel habits, loss of appetite, bloody stools.  MSK:  No joint pain or swelling.  No decreased range of motion.  No back pain. Neuro: No dizziness or lightheadedness.  Psych: No depression or anxiety. Mood stable.     Physical Exam:  BP 112/78   Pulse 87   Ht '5\' 2"'$  (1.575 m)   Wt 147 lb 12.8 oz (67 kg)   SpO2 95%  BMI 27.03 kg/m   GEN: Pleasant, interactive, well-appearing; in no acute distress. HEENT:  Normocephalic and atraumatic. PERRLA. Sclera white. Nasal turbinates pink, moist and patent bilaterally. No rhinorrhea present. Oropharynx pink and moist, without exudate or edema. No lesions, ulcerations, or postnasal drip.  NECK:  Supple w/ fair ROM. No JVD present. Normal carotid impulses w/o bruits. Thyroid symmetrical with no goiter or nodules palpated. No lymphadenopathy.   CV: RRR, no m/r/g, no peripheral edema. Pulses intact, +2 bilaterally. No cyanosis, pallor or clubbing. PULMONARY:  Unlabored, regular breathing. Diminished bilaterally A&P w/o wheezes/rales/rhonchi. No accessory muscle use.  GI: BS present and normoactive. Soft, non-tender to palpation. No organomegaly or masses detected.  MSK: No erythema, warmth or tenderness. No deformities or joint swelling noted.  Neuro: A/Ox3. No focal deficits noted.   Skin:  Warm, no lesions or rashe Psych: Normal affect and behavior. Judgement and thought content appropriate.     Lab Results:  CBC    Component Value Date/Time   WBC 9.5 12/22/2021 1500   RBC 4.60 12/22/2021 1500   HGB 14.2 12/22/2021 1500   HCT 42.8 12/22/2021 1500   PLT 306.0 12/22/2021 1500   MCV 93.0 12/22/2021 1500   MCH 30.8 11/22/2016 0832   MCHC 33.3 12/22/2021 1500   RDW 13.9 12/22/2021 1500   LYMPHSABS 2.5 12/22/2021 1500   MONOABS 0.5 12/22/2021 1500   EOSABS 0.0 12/22/2021 1500   BASOSABS 0.1 12/22/2021 1500    BMET    Component Value Date/Time   NA 143 08/18/2022 0917   K 4.1 08/18/2022 0917   CL 104 08/18/2022 0917   CO2 35 (H) 08/18/2022 0917   GLUCOSE 101 (H) 08/18/2022 0917   BUN 15 08/18/2022 0917   CREATININE 0.64 08/18/2022 0917   CALCIUM 9.4 08/18/2022 0917   GFRNONAA >60 11/22/2016 0832   GFRAA >60 11/22/2016 0832    BNP    Component Value Date/Time   BNP 34.1 11/22/2016 0832     Imaging:  No results found.        Latest Ref Rng & Units 10/24/2018   11:36 AM  PFT Results  FVC-Pre L 2.22   FVC-Predicted Pre % 74   FVC-Post L 2.15   FVC-Predicted Post % 72   Pre FEV1/FVC % % 52   Post FEV1/FCV % % 50   FEV1-Pre L 1.17   FEV1-Predicted Pre % 51   FEV1-Post L 1.08   DLCO uncorrected ml/min/mmHg 15.91   DLCO UNC% % 73   DLVA Predicted % 83   TLC L 6.19   TLC % Predicted % 130   RV % Predicted % 190     No results found for: "NITRICOXIDE"      Assessment & Plan:   COPD with chronic bronchitis and emphysema (HCC) Severe COPD prone to frequent exacerbations. She has been unable to tolerate azithromycin and daliresp. She has been doing well without flare since September. Encouraged her to continue to use her nebulizer as needed and prior to [place]. Advised on measures to avoid/prevent environmental triggers.   Patient Instructions  Continue Breztri 2 puffs Twice daily. Brush tongue and rinse mouth afterwards Continue  Albuterol inhaler 2 puffs or 3 mL neb every 6 hours as needed for shortness of breath or wheezing. Notify if symptoms persist despite rescue inhaler/neb use. Use your neb 2-4 times a day if you have worsening chest congestion, shortness of breath or cough Continue Mucinex [phone] mg Twice daily for chest congestion  The lung cancer screening program should be calling you to schedule your CT next year   Follow up in 3 months with Dr. Valeta Kline or Joellen Jersey Akeela Busk,Kline. If symptoms do not improve or worsen, please contact office for sooner follow up or seek emergency care.   Lung nodule Stable 3 mm nodule in RLL. She has had resolution of previous nodules of concern and negative bronchoscopy. We referred her back to the lung cancer screening program at last OV. They attempted to contact her. We will reach out to them to try to contact her again. Next CT due 07/2023.     I spent 28 minutes of dedicated to the care of this patient on the date of this encounter to include pre-visit review of records, face-to-face time with the patient discussing conditions above, post visit ordering of testing, clinical documentation with the electronic health record, making appropriate referrals as documented, and communicating necessary findings to members of the patients care team.  Clayton Bibles, Kline 09/15/2022  Pt aware and understands Kline's role.

## 2022-09-15 NOTE — Patient Instructions (Addendum)
Continue Breztri 2 puffs Twice daily. Brush tongue and rinse mouth afterwards Continue Albuterol inhaler 2 puffs or 3 mL neb every 6 hours as needed for shortness of breath or wheezing. Notify if symptoms persist despite rescue inhaler/neb use. Use your neb 2-4 times a day if you have worsening chest congestion, shortness of breath or cough Continue Mucinex [phone] mg Twice daily for chest congestion    The lung cancer screening program should be calling you to schedule your CT next year   Follow up in 3 months with Dr. Valeta Harms or Joellen Jersey Philamena Kramar,NP. If symptoms do not improve or worsen, please contact office for sooner follow up or seek emergency care.

## 2022-09-15 NOTE — Assessment & Plan Note (Signed)
Severe COPD prone to frequent exacerbations. She has been unable to tolerate azithromycin and daliresp. She has been doing well without flare since September. Encouraged her to continue to use her nebulizer as needed and prior to [place]. Advised on measures to avoid/prevent environmental triggers.   Patient Instructions  Continue Breztri 2 puffs Twice daily. Brush tongue and rinse mouth afterwards Continue Albuterol inhaler 2 puffs or 3 mL neb every 6 hours as needed for shortness of breath or wheezing. Notify if symptoms persist despite rescue inhaler/neb use. Use your neb 2-4 times a day if you have worsening chest congestion, shortness of breath or cough Continue Mucinex [phone] mg Twice daily for chest congestion    The lung cancer screening program should be calling you to schedule your CT next year   Follow up in 3 months with Dr. Valeta Harms or Joellen Jersey Airelle Everding,NP. If symptoms do not improve or worsen, please contact office for sooner follow up or seek emergency care.

## 2022-09-15 NOTE — Assessment & Plan Note (Signed)
Stable 3 mm nodule in RLL. She has had resolution of previous nodules of concern and negative bronchoscopy. We referred her back to the lung cancer screening program at last OV. They attempted to contact her. We will reach out to them to try to contact her again. Next CT due 07/2023.

## 2022-09-16 ENCOUNTER — Other Ambulatory Visit: Payer: Self-pay | Admitting: *Deleted

## 2022-09-16 DIAGNOSIS — Z122 Encounter for screening for malignant neoplasm of respiratory organs: Secondary | ICD-10-CM

## 2022-09-16 DIAGNOSIS — Z87891 Personal history of nicotine dependence: Secondary | ICD-10-CM

## 2022-10-23 ENCOUNTER — Other Ambulatory Visit: Payer: Self-pay | Admitting: Internal Medicine

## 2022-10-23 DIAGNOSIS — J441 Chronic obstructive pulmonary disease with (acute) exacerbation: Secondary | ICD-10-CM

## 2022-10-23 DIAGNOSIS — F419 Anxiety disorder, unspecified: Secondary | ICD-10-CM

## 2022-10-23 NOTE — Telephone Encounter (Signed)
Ok to PCP please 

## 2022-10-25 ENCOUNTER — Encounter: Payer: Self-pay | Admitting: Internal Medicine

## 2022-10-25 ENCOUNTER — Ambulatory Visit (INDEPENDENT_AMBULATORY_CARE_PROVIDER_SITE_OTHER): Payer: Medicare Other | Admitting: Internal Medicine

## 2022-10-25 VITALS — BP 136/86 | HR 94 | Temp 97.9°F | Resp 16 | Ht 62.0 in | Wt 147.0 lb

## 2022-10-25 DIAGNOSIS — F419 Anxiety disorder, unspecified: Secondary | ICD-10-CM | POA: Diagnosis not present

## 2022-10-25 DIAGNOSIS — Z0000 Encounter for general adult medical examination without abnormal findings: Secondary | ICD-10-CM

## 2022-10-25 DIAGNOSIS — R739 Hyperglycemia, unspecified: Secondary | ICD-10-CM | POA: Diagnosis not present

## 2022-10-25 DIAGNOSIS — I700 Atherosclerosis of aorta: Secondary | ICD-10-CM | POA: Diagnosis not present

## 2022-10-25 DIAGNOSIS — E538 Deficiency of other specified B group vitamins: Secondary | ICD-10-CM

## 2022-10-25 DIAGNOSIS — E785 Hyperlipidemia, unspecified: Secondary | ICD-10-CM

## 2022-10-25 DIAGNOSIS — Z0001 Encounter for general adult medical examination with abnormal findings: Secondary | ICD-10-CM

## 2022-10-25 DIAGNOSIS — Z23 Encounter for immunization: Secondary | ICD-10-CM

## 2022-10-25 DIAGNOSIS — K635 Polyp of colon: Secondary | ICD-10-CM

## 2022-10-25 LAB — LIPID PANEL
Cholesterol: 224 mg/dL — ABNORMAL HIGH (ref 0–200)
HDL: 71.4 mg/dL (ref >39.00)
LDL Cholesterol: 133 mg/dL — ABNORMAL HIGH (ref 0–99)
NonHDL: 152.35
Total CHOL/HDL Ratio: 3
Triglycerides: 96 mg/dL (ref 0.0–149.0)
VLDL: 19.2 mg/dL (ref 0.0–40.0)

## 2022-10-25 LAB — VITAMIN B12: Vitamin B-12: 193 pg/mL — ABNORMAL LOW (ref 211–911)

## 2022-10-25 LAB — FOLATE: Folate: 8.7 ng/mL (ref >5.9)

## 2022-10-25 LAB — HEMOGLOBIN A1C: Hgb A1c MFr Bld: 6.1 % (ref 4.6–6.5)

## 2022-10-25 MED ORDER — ALPRAZOLAM 0.25 MG PO TABS: ORAL_TABLET | ORAL | 3 refills | Status: DC

## 2022-10-25 NOTE — Patient Instructions (Signed)

## 2022-10-25 NOTE — Progress Notes (Signed)
Subjective:  Patient ID: Lynn Kline, female    DOB: 04-12-1956  Age: 67 y.o. MRN: [id number]  CC: Annual Exam, Anemia, and Hyperlipidemia   HPI TOMMI CREPEAU presents for a CPX and f/up -  She continues to c/o anxiety and requests a refill of xanax. She denies exertional CP or diaphoresis.  Outpatient Medications Prior to Visit  Medication Sig Dispense Refill   albuterol (PROVENTIL) (2.5 MG/3ML) 0.083% nebulizer solution TAKE 3 MLS BY NEBULIZATION EVERY 4 HOURS AS NEEDED FOR WHEEZING OR SHORTNESS OF BREATH 75 mL 5   benzonatate (TESSALON) 100 MG capsule Take 1 capsule (100 mg total) by mouth 2 (two) times daily as needed for cough. 28 capsule 0   Budeson-Glycopyrrol-Formoterol (BREZTRI AEROSPHERE) 160-9-4.8 MCG/ACT AERO Inhale 2 puffs into the lungs in the morning and at bedtime. 32.1 g 3   Calcium Carbonate-Vitamin D (CALCIUM PLUS VITAMIN D PO) Take 1 tablet by mouth daily. '1200mg'$  calcium and 1000 units vitamin D     VENTOLIN HFA 108 (90 Base) MCG/ACT inhaler INHALE 1 TO 2 PUFFS INTO THE LUNGS EVERY 6 HOURS AS NEEDED FOR WHEEZING OR SHORTNESS OF BREATH 18 g 3   ALPRAZolam (XANAX) 0.25 MG tablet 1 tab yb mouth twice per day as needed 60 tablet 2   No facility-administered medications prior to visit.    ROS Review of Systems  Constitutional:  Negative for chills, diaphoresis and fatigue.  HENT: Negative.    Respiratory:  Positive for cough, chest tightness, shortness of breath and wheezing.   Cardiovascular:  Negative for chest pain, palpitations and leg swelling.  Gastrointestinal:  Negative for abdominal pain, diarrhea, nausea and vomiting.  Genitourinary: Negative.  Negative for difficulty urinating.  Musculoskeletal: Negative.   Skin: Negative.   Neurological: Negative.  Negative for dizziness and weakness.  Hematological:  Negative for adenopathy. Does not bruise/bleed easily.  Psychiatric/Behavioral:  Negative for confusion, decreased concentration, dysphoric mood and  suicidal ideas. The patient is nervous/anxious.     Objective:  BP 136/86 (BP Location: Right Arm, Patient Position: Sitting, Cuff Size: Large)   Pulse 94   Temp 97.9 F (36.6 C) (Oral)   Resp 16   Ht '5\' 2"'$  (1.575 m)   Wt 147 lb (66.7 kg)   SpO2 93%   BMI 26.89 kg/m   BP Readings from Last 3 Encounters:  10/25/22 136/86  09/15/22 112/78  08/18/22 132/80    Wt Readings from Last 3 Encounters:  10/25/22 147 lb (66.7 kg)  09/15/22 147 lb 12.8 oz (67 kg)  08/18/22 149 lb 6.4 oz (67.8 kg)    Physical Exam Vitals reviewed.  HENT:     Nose: Nose normal.  Eyes:     General: No scleral icterus.    Conjunctiva/sclera: Conjunctivae normal.  Cardiovascular:     Rate and Rhythm: Normal rate and regular rhythm.     Heart sounds: No murmur heard.    No gallop.  Pulmonary:     Effort: Pulmonary effort is normal.     Breath sounds: No stridor. No wheezing, rhonchi or rales.  Abdominal:     General: Abdomen is flat.     Palpations: There is no mass.     Tenderness: There is no abdominal tenderness. There is no guarding.     Hernia: No hernia is present.  Musculoskeletal:     Cervical back: Neck supple.     Right lower leg: No edema.     Left lower leg: No edema.  Lymphadenopathy:  Cervical: No cervical adenopathy.  Skin:    General: Skin is warm and dry.     Coloration: Skin is not pale.     Findings: No rash.  Neurological:     General: No focal deficit present.     Mental Status: She is alert.  Psychiatric:        Mood and Affect: Mood normal.        Behavior: Behavior normal.     Lab Results  Component Value Date   WBC 9.5 12/22/2021   HGB 14.2 12/22/2021   HCT 42.8 12/22/2021   PLT 306.0 12/22/2021   GLUCOSE 101 (H) 08/18/2022   CHOL 224 (H) 10/25/2022   TRIG 96.0 10/25/2022   HDL 71.40 10/25/2022   LDLDIRECT 141.0 11/15/2011   LDLCALC 133 (H) 10/25/2022   ALT 19 08/18/2022   AST 20 08/18/2022   NA 143 08/18/2022   K 4.1 08/18/2022   CL 104  08/18/2022   CREATININE 0.64 08/18/2022   BUN 15 08/18/2022   CO2 35 (H) 08/18/2022   TSH 0.49 12/22/2021   INR 1.0 08/10/2020   HGBA1C 6.1 10/25/2022    CT Chest Wo Contrast  Result Date: 08/09/2022 CLINICAL DATA:  Dyspnea, chronic, unclear etiology. Emphysema, lung nodule. EXAM: CT CHEST WITHOUT CONTRAST TECHNIQUE: Multidetector CT imaging of the chest was performed following the standard protocol without IV contrast. RADIATION DOSE REDUCTION: This exam was performed according to the departmental dose-optimization program which includes automated exposure control, adjustment of the mA and/or kV according to patient size and/or use of iterative reconstruction technique. COMPARISON:  07/19/2021, 01/15/2021. FINDINGS: Cardiovascular: The heart is normal in size and there is a trace pericardial effusion. Scattered coronary artery calcifications are noted. There is atherosclerotic calcification of the aorta without evidence of aneurysm. The pulmonary trunk is normal in caliber. Mediastinum/Nodes: No mediastinal or axillary lymphadenopathy. Evaluation of the hila is limited due to lack of IV contrast. Lungs/Pleura: Emphysematous changes with apical pleural scarring is noted bilaterally. Bronchial wall thickening is present bilaterally. No effusion or pneumothorax. Stable scarring and metallic markers are present in the posterior segment of the left upper lobe. There is a 3 mm nodule in the right lower lobe, axial image 86, unchanged from 01/15/2021 and not well seen on most recent exam. No new nodule is identified. Upper Abdomen: The gallbladder is surgically absent. No acute abnormality. Musculoskeletal: Degenerative changes in the thoracic spine. A stable compression deformity is present in the midthoracic spine at T7. No acute or suspicious osseous abnormality. IMPRESSION: 1. Stable scarring in the posterior segment of the left upper lobe and stable right lower lobe pulmonary nodule measuring 3 mm. No  follow-up recommended. This recommendation follows the consensus statement: Guidelines for Management of Incidental Pulmonary Nodules Detected on CT Images: From the Fleischner Society 2017; Radiology 2017; 284:228-243. 2. Emphysema. 3. Scattered coronary artery calcifications. 4. Aortic atherosclerosis. Electronically Signed   By: Brett Fairy M.D.   On: 08/09/2022 00:39    Assessment & Plan:   Marita was seen today for annual exam, anemia and hyperlipidemia.  Diagnoses and all orders for this visit:  Hyperglycemia- She is prediabetic. -     Hemoglobin A1c; Future -     Hemoglobin A1c  Anxiety -     ALPRAZolam (XANAX) 0.25 MG tablet; 1 tab yb mouth twice per day as needed  Hyperlipidemia with target LDL less than 130- Statin is not indicated. -     Lipid panel; Future -  Lipid panel  Atherosclerosis of aorta ([place])- Risk factors addressed  Encounter for well adult exam with abnormal findings- Exam completed, labs reviewed, vaccines reviewed and updated, cancer screenings addressed, patient education was given.  B12 deficiency - Will start parenteral B12 replacement. -     Vitamin B12; Future -     Folate; Future -     Folate -     Vitamin B12   I am having Tenzin L. Behe start on Shingrix. I am also having her maintain her Calcium Carbonate-Vitamin D (CALCIUM PLUS VITAMIN D PO), benzonatate, Breztri Aerosphere, albuterol, Ventolin HFA, and ALPRAZolam.  Meds ordered this encounter  Medications   ALPRAZolam (XANAX) 0.25 MG tablet    Sig: 1 tab yb mouth twice per day as needed    Dispense:  60 tablet    Refill:  3   Zoster Vaccine Adjuvanted [facility]) injection    Sig: Inject 0.5 mLs into the muscle once for 1 dose.    Dispense:  0.5 mL    Refill:  1     Follow-up: Return in about 6 months (around 04/25/2023).  Scarlette Calico, MD

## 2022-10-27 ENCOUNTER — Encounter: Payer: Self-pay | Admitting: Internal Medicine

## 2022-10-28 ENCOUNTER — Encounter: Payer: Self-pay | Admitting: Internal Medicine

## 2022-10-28 DIAGNOSIS — K635 Polyp of colon: Secondary | ICD-10-CM | POA: Insufficient documentation

## 2022-10-28 MED ORDER — SHINGRIX 50 MCG/0.5ML IM SUSR: 0.5000 mL | Freq: Once | INTRAMUSCULAR | 1 refills | Status: AC

## 2022-10-31 ENCOUNTER — Ambulatory Visit (INDEPENDENT_AMBULATORY_CARE_PROVIDER_SITE_OTHER): Payer: Medicare Other | Admitting: *Deleted

## 2022-10-31 DIAGNOSIS — E538 Deficiency of other specified B group vitamins: Secondary | ICD-10-CM

## 2022-10-31 MED ORDER — CYANOCOBALAMIN 1000 MCG/ML IJ SOLN
1000.0000 ug | Freq: Once | INTRAMUSCULAR | Status: AC
  Administered 2022-10-31: 1000 ug via INTRAMUSCULAR

## 2022-10-31 NOTE — Progress Notes (Signed)
Patient is here for her B12 injection. Given in her left deltoid. Patient tolerated well.  Please cosign

## 2022-11-03 ENCOUNTER — Ambulatory Visit: Payer: Medicare Other | Admitting: Pulmonary Disease

## 2022-11-03 ENCOUNTER — Encounter: Payer: Self-pay | Admitting: Pulmonary Disease

## 2022-11-03 VITALS — BP 128/68 | HR 85 | Temp 98.5°F | Ht 62.0 in | Wt 146.8 lb

## 2022-11-03 DIAGNOSIS — Z87891 Personal history of nicotine dependence: Secondary | ICD-10-CM | POA: Diagnosis not present

## 2022-11-03 DIAGNOSIS — J432 Centrilobular emphysema: Secondary | ICD-10-CM | POA: Diagnosis not present

## 2022-11-03 DIAGNOSIS — J439 Emphysema, unspecified: Secondary | ICD-10-CM

## 2022-11-03 DIAGNOSIS — J449 Chronic obstructive pulmonary disease, unspecified: Secondary | ICD-10-CM

## 2022-11-03 DIAGNOSIS — J4489 Other specified chronic obstructive pulmonary disease: Secondary | ICD-10-CM | POA: Diagnosis not present

## 2022-11-03 NOTE — Progress Notes (Signed)
Synopsis: Referred in November 2019 for emphysema by Janith Lima, MD  Subjective:   PATIENT ID: Lynn Kline GENDER: female DOB: [birth year], MRN: [id number]  Chief Complaint  Patient presents with   Follow-up    Pt is here for follow up for lung nodule/copd. Pt states that she is doing well today. She states she still has periods of running out of breath. Pt is on breztri daily and albuterol as needed.     PMH of tobacco abuse, smoked for 45 years, she recently had a CT chest with upper lobe emphysema. She recently found out that her 3 family members on her fathers side died of pulmonary fibrosis. Father with two brothers that died of lung cancer. Father died of melanoma. She is a current smoker, 5 cigs per day. She is really trying to stop. Never had any PFTs before. She gets really short of breath when she walks. If she takes her dog for a walk she will have to stop at the end of street and rest. She has chest tightness and usually can only due a few things for a few minutes and then she has to stop and sit and rest. She makes eye glasses for a living. She lives off her ventolin inhaler.  She has been using her albuterol inhaler 8-12 times per day.  She usually goes through 3-4 albuterol inhalers per month.  OV 10/24/2018: Patient has been doing well since her last visit.  She has been smoke-free for the past 2 days.  She had PFTs completed in today's office visit that revealed an FEV1 postbronchodilator of 1.08 L, 47% predicted an RV of 190%, DLCO 73%.  Patient still describes significant dyspnea on exertion.  She is having trouble getting in from her car to the house.  She does have daily cough and sputum production which is a little bit better.  She does think that the Trelegy has made a big difference with using it each day.  Overall she is a little bit anxious about the new diagnosis of COPD.  Patient denies chest pain or hemoptysis.  OV 02/26/2020: Patient here today for follow-up.   Last seen in our office by Wyn Quaker in April 2020 followed by June 2020 and again in October 2020.  Last office visit by Patricia Nettle in April 2021 telephone visit.  Per this visit she had quit smoking in March 2021.  Managed for moderate COPD last saw me in January 2020.  She was treated for exacerbation at that time congratulated on quitting smoking.  Treated with prednisone and doxycycline.  Continued recommendations for Trelegy inhaler.  Here today for follow-up after recent exacerbation.  Again today feels short of breath.  She has had significant issues with exertion.  Unfortunately she has occasionally went back to smoke a few times.  But has not smoked in the past week or more.  She is still using her Trelegy but has occasionally not use this to see if she can get by without having an inhaler.  We talked about portance of staying on regular maintenance inhaler.  OV 10/02/2020: This is a 67 year old female here today for follow-up of COPD as well as a recent lung cancer screening CT. Lung cancer screening CT was completed on 09/25/2020. This found a new left upper lobe nodule that was not present previously. The largest diameter of this kind of irregular shaped lesion is 18.6 mm. There is a more central solid component of the lesion that is  approximately 1 cm with irregular margins. Overall concerning for potential underlying malignancy. Also has association with severe centrilobular and paraseptal emphysema. From a respiratory standpoint she is still very dyspneic on exertion. Since we have last seen each other she has had three exacerbations requiring steroids and antibiotics. During that time though she had resorted back to smoking again. However the past couple weeks she states that she has quit smoking again because she "just cannot" do it anymore.  OV 01/18/2021: Patient here today for follow-up after bronchoscopy.  Patient's lung nodules were all negative for malignancy.  Patient here today for  follow-up after CT imaging that was completed on 01/15/2021.  Patient's left upper lobe nodules remain stable new small 6 mm nodule within the right middle lobe.  Unfortunately she started smoking again in between the last time I saw her.  She was down to 3 to 5 cigarettes a day.  She states she has not had a cigarette in the past couple of days.  She does have shortness of breath and wheezing at this time.  Does not like use of dry powder inhaler.  She is been on Trelegy for some time.  Would like to consider movement to a MDI.  Or some alternative besides dry powder.  OV 08/09/2021: Here today for follow-up after recent CT scan of the chest for follow-up of lung nodules.  Recent CT scan of the chest document stability of the upper lobe inflammatory scarring.  Nodules have otherwise dissipated.  Doing well on her triple therapy inhaler regimen, Breztri.  She recently had an exacerbation was put on antibiotics steroids.  Her last dose of steroids is tomorrow.  She feels much better.  She quit smoking over a month ago.  OV 07/21/2022: Here today for follow-up.  Unfortunately she had multiple exacerbations this past year requiring antibiotics and steroids.  She is currently using Breztri twice a day as needed albuterol.  She recently just completed another course of prednisone.  She has cough sputum production tach chest tightness and wheezing  OV 11/03/2022: Here today for COPD follow-up.  She was seen by Roxan Diesel, NP in November twice since seeing me in October.  She did not tolerate the azithromycin but she only took it for 4 days she felt like she could not breathe.  But I am not sure this was related to her taking this medication or not as she is on had ongoing issues with exacerbations related to her COPD.  She still has trouble with breathing chemical smells and dust at work.  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  She is kind of back to square 1 with just being on nebulized treatments and  her inhalers.  She still has chest tightness cough sputum production.  She was given Daliresp in the past and did not tolerate Daliresp due to diarrhea.    Past Medical History:  Diagnosis Date   Anxiety    Atherosclerosis    Colon polyp, hyperplastic    COPD (chronic obstructive pulmonary disease) (HCC)    Diverticulosis    Gallstone    Pneumonia      Family History  Problem Relation Age of Onset   Arthritis Mother    COPD Mother    Emphysema Mother    Stroke Father    Heart disease Father    Cancer Father        MELANOMA   Breast cancer Maternal Grandmother    Colon cancer Neg Hx  Past Surgical History:  Procedure Laterality Date   ABDOMINAL HYSTERECTOMY     BRONCHIAL BIOPSY  11/03/2020   Procedure: BRONCHIAL BIOPSIES;  Surgeon: Garner Nash, DO;  Location: [facility];  Service: Pulmonary;;   BRONCHIAL BRUSHINGS  11/03/2020   Procedure: BRONCHIAL BRUSHINGS;  Surgeon: Garner Nash, DO;  Location: [place] [facility];  Service: Pulmonary;;   BRONCHIAL NEEDLE ASPIRATION BIOPSY  11/03/2020   Procedure: BRONCHIAL NEEDLE ASPIRATION BIOPSIES;  Surgeon: Garner Nash, DO;  Location: [facility];  Service: Pulmonary;;   BRONCHIAL WASHINGS  11/03/2020   Procedure: BRONCHIAL WASHINGS;  Surgeon: Garner Nash, DO;  Location: [facility];  Service: Pulmonary;;   CESAREAN SECTION     CHOLECYSTECTOMY     COLONOSCOPY     FIDUCIAL MARKER PLACEMENT  11/03/2020   Procedure: FIDUCIAL MARKER PLACEMENT;  Surgeon: Garner Nash, DO;  Location: [facility];  Service: Pulmonary;;   VIDEO BRONCHOSCOPY WITH ENDOBRONCHIAL NAVIGATION N/A 11/03/2020   Procedure: VIDEO BRONCHOSCOPY WITH ENDOBRONCHIAL NAVIGATION AND POSSIBLE ULTRASOUND;  Surgeon: Garner Nash, DO;  Location: [facility];  Service: Pulmonary;  Laterality: N/A;   VIDEO BRONCHOSCOPY WITH ENDOBRONCHIAL ULTRASOUND  11/03/2020   Procedure: VIDEO BRONCHOSCOPY WITH ENDOBRONCHIAL ULTRASOUND;  Surgeon: Garner Nash, DO;   Location: [facility];  Service: Pulmonary;;    Social History   Socioeconomic History   Marital status: Divorced    Spouse name: Not on file   Number of children: Not on file   Years of education: Not on file   Highest education level: Not on file  Occupational History   Not on file  Tobacco Use   Smoking status: Former    Types: Cigarettes    Quit date: 12/15/2021    Years since quitting: 0.8   Smokeless tobacco: Never   Tobacco comments:    Pt states she quit smoking in March 2023. 07/21/2022 [place]  Substance and Sexual Activity   Alcohol use: No    Alcohol/week: 0.0 standard drinks of alcohol   Drug use: No   Sexual activity: Never    Comment: 1ST INTERCOURSE- 16, PARTNERS- 5  Other Topics Concern   Not on file  Social History Narrative   Not on file   Social Determinants of Health   Financial Resource Strain: Low Risk  (01/31/2022)   Overall Financial Resource Strain (CARDIA)    Difficulty of Paying Living Expenses: Not hard at all  Food Insecurity: No Food Insecurity (01/31/2022)   Hunger Vital Sign    Worried About Running Out of Food in the Last Year: Never true    [place] in the Last Year: Never true  Transportation Needs: No Transportation Needs (01/31/2022)   PRAPARE - Hydrologist (Medical): No    Lack of Transportation (Non-Medical): No  Physical Activity: Not on file  Stress: No Stress Concern Present (01/31/2022)   [place]    Feeling of Stress : Not at all  Social Connections: Moderately Integrated (01/31/2022)   Social Connection and Isolation Panel [NHANES]    Frequency of Communication with Friends and Family: More than three times a week    Frequency of Social Gatherings with Friends and Family: More than three times a week    Attends Religious Services: 1 to 4 times per year    Active Member of [employer] or Organizations: Yes    Attends Theatre manager Meetings: 1 to 4 times per year  Marital Status: Divorced  Human resources officer Violence: Not At Risk (01/31/2022)   Humiliation, Afraid, Rape, and Kick questionnaire    Fear of Current or Ex-Partner: No    Emotionally Abused: No    Physically Abused: No    Sexually Abused: No     Allergies  Allergen Reactions   Codeine Nausea And Vomiting   Erythromycin Nausea And Vomiting   Ampicillin Nausea And Vomiting, Rash and Other (See Comments)    Pt states that rash was on her tongue.  Has patient had a PCN reaction causing immediate rash, facial/tongue/throat swelling, SOB or lightheadedness with hypotension yes Has patient had a PCN reaction causing severe rash involving mucus membranes or skin necrosis: no Has patient had a PCN reaction that required hospitalization yes Has patient had a PCN reaction occurring within the last 10 years: no If all of the above answers are "NO", then may proceed with Cephalosporin use.      Outpatient Medications Prior to Visit  Medication Sig Dispense Refill   albuterol (PROVENTIL) (2.5 MG/3ML) 0.083% nebulizer solution TAKE 3 MLS BY NEBULIZATION EVERY 4 HOURS AS NEEDED FOR WHEEZING OR SHORTNESS OF BREATH 75 mL 5   ALPRAZolam (XANAX) 0.25 MG tablet 1 tab yb mouth twice per day as needed 60 tablet 3   benzonatate (TESSALON) 100 MG capsule Take 1 capsule (100 mg total) by mouth 2 (two) times daily as needed for cough. 28 capsule 0   Budeson-Glycopyrrol-Formoterol (BREZTRI AEROSPHERE) 160-9-4.8 MCG/ACT AERO Inhale 2 puffs into the lungs in the morning and at bedtime. 32.1 g 3   Calcium Carbonate-Vitamin D (CALCIUM PLUS VITAMIN D PO) Take 1 tablet by mouth daily. '1200mg'$  calcium and 1000 units vitamin D     VENTOLIN HFA 108 (90 Base) MCG/ACT inhaler INHALE 1 TO 2 PUFFS INTO THE LUNGS EVERY 6 HOURS AS NEEDED FOR WHEEZING OR SHORTNESS OF BREATH 18 g 3   No facility-administered medications prior to visit.    Review of Systems  Constitutional:   Negative for chills, fever, malaise/fatigue and weight loss.  HENT:  Negative for hearing loss, sore throat and tinnitus.   Eyes:  Negative for blurred vision and double vision.  Respiratory:  Positive for cough, shortness of breath and wheezing. Negative for hemoptysis, sputum production and stridor.   Cardiovascular:  Negative for chest pain, palpitations, orthopnea, leg swelling and PND.  Gastrointestinal:  Negative for abdominal pain, constipation, diarrhea, heartburn, nausea and vomiting.  Genitourinary:  Negative for dysuria, hematuria and urgency.  Musculoskeletal:  Negative for joint pain and myalgias.  Skin:  Negative for itching and rash.  Neurological:  Negative for dizziness, tingling, weakness and headaches.  Endo/Heme/Allergies:  Negative for environmental allergies. Does not bruise/bleed easily.  Psychiatric/Behavioral:  Negative for depression. The patient is not nervous/anxious and does not have insomnia.   All other systems reviewed and are negative.    Objective:  Physical Exam Vitals reviewed.  Constitutional:      General: She is not in acute distress.    Appearance: She is well-developed.  HENT:     Head: Normocephalic and atraumatic.     Mouth/Throat:     Pharynx: No oropharyngeal exudate.  Eyes:     Conjunctiva/sclera: Conjunctivae normal.     Pupils: Pupils are equal, round, and reactive to light.  Neck:     Vascular: No JVD.     Trachea: No tracheal deviation.     Comments: Loss of supraclavicular fat Cardiovascular:     Rate and Rhythm:  Normal rate and regular rhythm.     Heart sounds: S1 normal and S2 normal.     Comments: Distant heart tones Pulmonary:     Effort: No tachypnea or accessory muscle usage.     Breath sounds: No stridor. Decreased breath sounds (throughout all lung fields) and wheezing present. No rhonchi or rales.  Abdominal:     General: There is no distension.     Palpations: Abdomen is soft.     Tenderness: There is no abdominal  tenderness.  Musculoskeletal:        General: Deformity (muscle wasting ) present.  Skin:    General: Skin is warm and dry.     Capillary Refill: Capillary refill takes less than 2 seconds.     Findings: No rash.  Neurological:     Mental Status: She is alert and oriented to person, place, and time.  Psychiatric:        Behavior: Behavior normal.      Vitals:   11/03/22 0836  BP: 128/68  Pulse: 85  Temp: 98.5 F (36.9 C)  TempSrc: Oral  SpO2: 94%  Weight: 146 lb 12.8 oz (66.6 kg)  Height: '5\' 2"'$  (1.575 m)   94% on RA BMI Readings from Last 3 Encounters:  11/03/22 26.85 kg/m  10/25/22 26.89 kg/m  09/15/22 27.03 kg/m   Wt Readings from Last 3 Encounters:  11/03/22 146 lb 12.8 oz (66.6 kg)  10/25/22 147 lb (66.7 kg)  09/15/22 147 lb 12.8 oz (67 kg)     CBC    Component Value Date/Time   WBC 9.5 12/22/2021 1500   RBC 4.60 12/22/2021 1500   HGB 14.2 12/22/2021 1500   HCT 42.8 12/22/2021 1500   PLT 306.0 12/22/2021 1500   MCV 93.0 12/22/2021 1500   MCH 30.8 11/22/2016 0832   MCHC 33.3 12/22/2021 1500   RDW 13.9 12/22/2021 1500   LYMPHSABS 2.5 12/22/2021 1500   MONOABS 0.5 12/22/2021 1500   EOSABS 0.0 12/22/2021 1500   BASOSABS 0.1 12/22/2021 1500    Chest Imaging: 07/24/2018 -CT chest with evidence of mild upper lobe predominant emphysematous change..  01/15/2021: CT chest stable left upper lobe nodule, 6 mm right middle lobe nodule. The patient's images have been independently reviewed by me.    October 2022: CT scan of the chest reveals stability of the left upper lobe scarring.  Lung nodules have somewhat dissipated. The patient's images have been independently reviewed by me.    Chest x-ray 07/08/2022: No infiltrate no pneumonia. Evidence of hyperinflation. The patient's images have been independently reviewed by me.    Pulmonary Functions Testing Results:    Latest Ref Rng & Units 10/24/2018   11:36 AM  PFT Results  FVC-Pre L 2.22    FVC-Predicted Pre % 74   FVC-Post L 2.15   FVC-Predicted Post % 72   Pre FEV1/FVC % % 52   Post FEV1/FCV % % 50   FEV1-Pre L 1.17   FEV1-Predicted Pre % 51   FEV1-Post L 1.08   DLCO uncorrected ml/min/mmHg 15.91   DLCO UNC% % 73   DLVA Predicted % 83   TLC L 6.19   TLC % Predicted % 130   RV % Predicted % 190     FeNO: None   Pathology:   Bronchscopy: LUL nodules, negative formalignancy   Echocardiogram: None   Heart Catheterization: None    Assessment & Plan:   COPD with chronic bronchitis and emphysema (HCC)  Centrilobular emphysema (HCC)  Former smoker  COPD, severe ([place])  Discussion:  This is a 67 year old female, severe COPD FEV1 of 1.08 L, 47% predicted, abnormal lung cancer screening CT with prior evaluation of bronchoscopy nondiagnostic nodules negative for malignancy dissipation of the nodules on recent CT scan suggest as an inflammatory etiology.  We have been working on treatments for and management of her COPD and chronic bronchitis symptoms.  Plan: Recommend her to restart her azithromycin 250 mg Monday Wednesday Friday I do think she needs to try to give this a repeat chance. She has chronic bronchitis symptoms that I believe this medication will help her I want her to lease give it a try for a month before she discontinues. If she has any symptoms that she feels is related to the azithromycin she is going to let us know.  Last time she states that she felt like she had a little trouble catching her breath I do not believe this was necessarily related to the medication. She has taken Z-Pak medications in the past with no issue  RTC in 3 months     Current Outpatient Medications:    albuterol (PROVENTIL) (2.5 MG/3ML) 0.083% nebulizer solution, TAKE 3 MLS BY NEBULIZATION EVERY 4 HOURS AS NEEDED FOR WHEEZING OR SHORTNESS OF BREATH, Disp: 75 mL, Rfl: 5   ALPRAZolam (XANAX) 0.25 MG tablet, 1 tab yb mouth twice per day as needed, Disp: 60 tablet, Rfl:  3   benzonatate (TESSALON) 100 MG capsule, Take 1 capsule (100 mg total) by mouth 2 (two) times daily as needed for cough., Disp: 28 capsule, Rfl: 0   Budeson-Glycopyrrol-Formoterol (BREZTRI AEROSPHERE) 160-9-4.8 MCG/ACT AERO, Inhale 2 puffs into the lungs in the morning and at bedtime., Disp: 32.1 g, Rfl: 3   Calcium Carbonate-Vitamin D (CALCIUM PLUS VITAMIN D PO), Take 1 tablet by mouth daily. '1200mg'$  calcium and 1000 units vitamin D, Disp: , Rfl:    VENTOLIN HFA 108 (90 Base) MCG/ACT inhaler, INHALE 1 TO 2 PUFFS INTO THE LUNGS EVERY 6 HOURS AS NEEDED FOR WHEEZING OR SHORTNESS OF BREATH, Disp: 18 g, Rfl: 3    Garner Nash, DO [facility] 11/03/2022 9:08 AM

## 2022-11-03 NOTE — Patient Instructions (Signed)
Thank you for visiting Dr. Valeta Harms at [facility]. Today we recommend the following:  Restart Azithromycin MWF  Continue current inhaler regimen   Return in about 3 months (around 02/02/2023).    Please do your part to reduce the spread of COVID-19.

## 2022-12-01 ENCOUNTER — Ambulatory Visit: Payer: Medicare Other

## 2022-12-02 ENCOUNTER — Ambulatory Visit (INDEPENDENT_AMBULATORY_CARE_PROVIDER_SITE_OTHER): Payer: Medicare Other

## 2022-12-02 DIAGNOSIS — E538 Deficiency of other specified B group vitamins: Secondary | ICD-10-CM

## 2022-12-02 MED ORDER — CYANOCOBALAMIN 1000 MCG/ML IJ SOLN
1000.0000 ug | Freq: Once | INTRAMUSCULAR | Status: AC
  Administered 2022-12-02: 1000 ug via INTRAMUSCULAR

## 2022-12-02 NOTE — Progress Notes (Signed)
Pt here for monthly B12 injection per Dr Ronnald Ramp   B12 1093mg given IM, and pt tolerated injection well.

## 2022-12-05 ENCOUNTER — Ambulatory Visit (INDEPENDENT_AMBULATORY_CARE_PROVIDER_SITE_OTHER): Payer: Medicare Other | Admitting: Family Medicine

## 2022-12-05 ENCOUNTER — Encounter: Payer: Self-pay | Admitting: Family Medicine

## 2022-12-05 ENCOUNTER — Ambulatory Visit (INDEPENDENT_AMBULATORY_CARE_PROVIDER_SITE_OTHER): Payer: Medicare Other

## 2022-12-05 VITALS — BP 144/82 | HR 86 | Temp 98.2°F | Resp 20 | Ht 62.0 in | Wt 144.0 lb

## 2022-12-05 DIAGNOSIS — R0689 Other abnormalities of breathing: Secondary | ICD-10-CM | POA: Diagnosis not present

## 2022-12-05 DIAGNOSIS — R0602 Shortness of breath: Secondary | ICD-10-CM | POA: Diagnosis not present

## 2022-12-05 DIAGNOSIS — J4489 Other specified chronic obstructive pulmonary disease: Secondary | ICD-10-CM | POA: Diagnosis not present

## 2022-12-05 DIAGNOSIS — M940 Chondrocostal junction syndrome [Tietze]: Secondary | ICD-10-CM

## 2022-12-05 DIAGNOSIS — R079 Chest pain, unspecified: Secondary | ICD-10-CM | POA: Diagnosis not present

## 2022-12-05 DIAGNOSIS — J439 Emphysema, unspecified: Secondary | ICD-10-CM | POA: Diagnosis not present

## 2022-12-05 MED ORDER — PREDNISONE 20 MG PO TABS: 40.0000 mg | ORAL_TABLET | Freq: Every day | ORAL | 0 refills | Status: AC

## 2022-12-05 NOTE — Progress Notes (Signed)
Assessment & Plan:  1. Acute costochondritis Education provided on costochondritis.  Encouraged patient to continue using albuterol and Breztri. - predniSONE (DELTASONE) 20 MG tablet; Take 2 tablets (40 mg total) by mouth daily for 5 days.  Dispense: 10 tablet; Refill: 0  2. COPD with chronic bronchitis and emphysema ([place]) Continue using albuterol nebulizer and/or inhaler as needed with Breztri twice daily.  3. Decreased breath sounds - DG Chest 2 View   Follow up plan: Return if symptoms worsen or fail to improve.  Hendricks Limes, MSN, APRN, FNP-C  Subjective:  HPI: Lynn Kline is a 67 y.o. female presenting on 12/05/2022 for Cough (Upper left chest pain from cough. (Recent bronchitis) )  Patient complains of cough, wheezing, and left upper chest/back pain . She denies  upper respiratory symptoms . Onset of symptoms was a few days ago, gradually worsening since that time. She is drinking plenty of fluids. Evaluation to date: She was treated with antibiotics for a COPD exacerbation 2 to 3 weeks ago. Treatment to date:  Albuterol nebulizer, albuterol inhaler, and Breztri . She has a history of COPD. She does not smoke.    ROS: Negative unless specifically indicated above in HPI.   Relevant past medical history reviewed and updated as indicated.   Allergies and medications reviewed and updated.   Current Outpatient Medications:    albuterol (PROVENTIL) (2.5 MG/3ML) 0.083% nebulizer solution, TAKE 3 MLS BY NEBULIZATION EVERY 4 HOURS AS NEEDED FOR WHEEZING OR SHORTNESS OF BREATH, Disp: 75 mL, Rfl: 5   ALPRAZolam (XANAX) 0.25 MG tablet, 1 tab yb mouth twice per day as needed, Disp: 60 tablet, Rfl: 3   Budeson-Glycopyrrol-Formoterol (BREZTRI AEROSPHERE) 160-9-4.8 MCG/ACT AERO, Inhale 2 puffs into the lungs in the morning and at bedtime., Disp: 32.1 g, Rfl: 3   VENTOLIN HFA 108 (90 Base) MCG/ACT inhaler, INHALE 1 TO 2 PUFFS INTO THE LUNGS EVERY 6 HOURS AS NEEDED FOR WHEEZING OR  SHORTNESS OF BREATH, Disp: 18 g, Rfl: 3   Calcium Carbonate-Vitamin D (CALCIUM PLUS VITAMIN D PO), Take 1 tablet by mouth daily. 1280m calcium and 1000 units vitamin D (Patient not taking: Reported on 12/05/2022), Disp: , Rfl:   Allergies  Allergen Reactions   Codeine Nausea And Vomiting   Erythromycin Nausea And Vomiting   Ampicillin Nausea And Vomiting, Rash and Other (See Comments)    Pt states that rash was on her tongue.  Has patient had a PCN reaction causing immediate rash, facial/tongue/throat swelling, SOB or lightheadedness with hypotension yes Has patient had a PCN reaction causing severe rash involving mucus membranes or skin necrosis: no Has patient had a PCN reaction that required hospitalization yes Has patient had a PCN reaction occurring within the last 10 years: no If all of the above answers are "NO", then may proceed with Cephalosporin use.     Objective:   BP (!) 144/82   Pulse 86   Temp 98.2 F (36.8 C)   Resp 20   Ht 5' 2"$  (1.575 m)   Wt 144 lb (65.3 kg)   BMI 26.34 kg/m    Physical Exam Vitals reviewed.  Constitutional:      General: She is not in acute distress.    Appearance: Normal appearance. She is not ill-appearing, toxic-appearing or diaphoretic.  HENT:     Head: Normocephalic and atraumatic.  Eyes:     General: No scleral icterus.       Right eye: No discharge.  Left eye: No discharge.     Conjunctiva/sclera: Conjunctivae normal.  Cardiovascular:     Rate and Rhythm: Normal rate and regular rhythm.     Heart sounds: Normal heart sounds. No murmur heard.    No friction rub. No gallop.  Pulmonary:     Effort: Pulmonary effort is normal. No respiratory distress.     Breath sounds: No stridor. Examination of the right-upper field reveals wheezing. Examination of the left-upper field reveals decreased breath sounds. Examination of the right-middle field reveals wheezing. Examination of the left-middle field reveals decreased breath  sounds. Examination of the right-lower field reveals wheezing. Examination of the left-lower field reveals decreased breath sounds. Decreased breath sounds and wheezing present. No rhonchi or rales.  Chest:     Chest wall: Tenderness (left upper chest) present. No crepitus.  Musculoskeletal:        General: Normal range of motion.     Cervical back: Normal range of motion.  Skin:    General: Skin is warm and dry.     Capillary Refill: Capillary refill takes less than 2 seconds.  Neurological:     General: No focal deficit present.     Mental Status: She is alert and oriented to person, place, and time. Mental status is at baseline.  Psychiatric:        Mood and Affect: Mood normal.        Behavior: Behavior normal.        Thought Content: Thought content normal.        Judgment: Judgment normal.

## 2022-12-14 ENCOUNTER — Other Ambulatory Visit: Payer: Self-pay | Admitting: Internal Medicine

## 2022-12-14 DIAGNOSIS — E538 Deficiency of other specified B group vitamins: Secondary | ICD-10-CM

## 2022-12-14 MED ORDER — CYANOCOBALAMIN 2000 MCG PO TABS: 2000.0000 ug | ORAL_TABLET | Freq: Every day | ORAL | 1 refills | Status: DC

## 2023-01-02 ENCOUNTER — Ambulatory Visit: Payer: Medicare Other

## 2023-02-06 ENCOUNTER — Other Ambulatory Visit: Payer: Self-pay | Admitting: Internal Medicine

## 2023-02-06 DIAGNOSIS — J441 Chronic obstructive pulmonary disease with (acute) exacerbation: Secondary | ICD-10-CM

## 2023-02-06 DIAGNOSIS — J432 Centrilobular emphysema: Secondary | ICD-10-CM

## 2023-02-06 DIAGNOSIS — R0609 Other forms of dyspnea: Secondary | ICD-10-CM

## 2023-02-09 ENCOUNTER — Telehealth: Payer: Self-pay | Admitting: Internal Medicine

## 2023-02-09 NOTE — Telephone Encounter (Signed)
Contacted Lynn Kline to schedule their annual wellness visit. Appointment made for 02/16/2023.  [facility] Care Guide [facility] AWV TEAM Direct Dial: [phone]

## 2023-02-13 ENCOUNTER — Encounter: Payer: Self-pay | Admitting: Pulmonary Disease

## 2023-02-13 ENCOUNTER — Ambulatory Visit: Payer: Medicare Other | Admitting: Pulmonary Disease

## 2023-02-13 VITALS — BP 130/80 | HR 85 | Ht 62.0 in | Wt 142.6 lb

## 2023-02-13 DIAGNOSIS — J432 Centrilobular emphysema: Secondary | ICD-10-CM | POA: Diagnosis not present

## 2023-02-13 DIAGNOSIS — Z87891 Personal history of nicotine dependence: Secondary | ICD-10-CM

## 2023-02-13 DIAGNOSIS — R918 Other nonspecific abnormal finding of lung field: Secondary | ICD-10-CM | POA: Diagnosis not present

## 2023-02-13 DIAGNOSIS — J439 Emphysema, unspecified: Secondary | ICD-10-CM

## 2023-02-13 DIAGNOSIS — J4489 Other specified chronic obstructive pulmonary disease: Secondary | ICD-10-CM | POA: Diagnosis not present

## 2023-02-13 NOTE — Progress Notes (Signed)
Synopsis: Referred in November 2019 for emphysema by Etta Grandchild, MD  Subjective:   PATIENT ID: Lynn Kline GENDER: female DOB: January 08, 1956, MRN: [id number]  Chief Complaint  Patient presents with   Follow-up    3 mos f/up    PMH of tobacco abuse, smoked for 45 years, she recently had a CT chest with upper lobe emphysema. She recently found out that her 3 family members on her fathers side died of pulmonary fibrosis. Father with two brothers that died of lung cancer. Father died of melanoma. She is a current smoker, 5 cigs per day. She is really trying to stop. Never had any PFTs before. She gets really short of breath when she walks. If she takes her dog for a walk she will have to stop at the end of street and rest. She has chest tightness and usually can only due a few things for a few minutes and then she has to stop and sit and rest. She makes eye glasses for a living. She lives off her ventolin inhaler.  She has been using her albuterol inhaler 8-12 times per day.  She usually goes through 3-4 albuterol inhalers per month.  OV 10/24/2018: Patient has been doing well since her last visit.  She has been smoke-free for the past 2 days.  She had PFTs completed in today's office visit that revealed an FEV1 postbronchodilator of 1.08 L, 47% predicted an RV of 190%, DLCO 73%.  Patient still describes significant dyspnea on exertion.  She is having trouble getting in from her car to the house.  She does have daily cough and sputum production which is a little bit better.  She does think that the Trelegy has made a big difference with using it each day.  Overall she is a little bit anxious about the new diagnosis of COPD.  Patient denies chest pain or hemoptysis.  OV 02/26/2020: Patient here today for follow-up.  Last seen in our office by Elisha Headland in April 2020 followed by June 2020 and again in October 2020.  Last office visit by Rikki Spearing in April 2021 telephone visit.  Per this visit she  had quit smoking in March 2021.  Managed for moderate COPD last saw me in January 2020.  She was treated for exacerbation at that time congratulated on quitting smoking.  Treated with prednisone and doxycycline.  Continued recommendations for Trelegy inhaler.  Here today for follow-up after recent exacerbation.  Again today feels short of breath.  She has had significant issues with exertion.  Unfortunately she has occasionally went back to smoke a few times.  But has not smoked in the past week or more.  She is still using her Trelegy but has occasionally not use this to see if she can get by without having an inhaler.  We talked about portance of staying on regular maintenance inhaler.  OV 10/02/2020: This is a 67 year old female here today for follow-up of COPD as well as a recent lung cancer screening CT. Lung cancer screening CT was completed on 09/25/2020. This found a new left upper lobe nodule that was not present previously. The largest diameter of this kind of irregular shaped lesion is 18.6 mm. There is a more central solid component of the lesion that is approximately 1 cm with irregular margins. Overall concerning for potential underlying malignancy. Also has association with severe centrilobular and paraseptal emphysema. From a respiratory standpoint she is still very dyspneic on exertion. Since we have  last seen each other she has had three exacerbations requiring steroids and antibiotics. During that time though she had resorted back to smoking again. However the past couple weeks she states that she has quit smoking again because she "just cannot" do it anymore.  OV 01/18/2021: Patient here today for follow-up after bronchoscopy.  Patient's lung nodules were all negative for malignancy.  Patient here today for follow-up after CT imaging that was completed on 01/15/2021.  Patient's left upper lobe nodules remain stable new small 6 mm nodule within the right middle lobe.  Unfortunately she started  smoking again in between the last time I saw her.  She was down to 3 to 5 cigarettes a day.  She states she has not had a cigarette in the past couple of days.  She does have shortness of breath and wheezing at this time.  Does not like use of dry powder inhaler.  She is been on Trelegy for some time.  Would like to consider movement to a MDI.  Or some alternative besides dry powder.  OV 08/09/2021: Here today for follow-up after recent CT scan of the chest for follow-up of lung nodules.  Recent CT scan of the chest document stability of the upper lobe inflammatory scarring.  Nodules have otherwise dissipated.  Doing well on her triple therapy inhaler regimen, Breztri.  She recently had an exacerbation was put on antibiotics steroids.  Her last dose of steroids is tomorrow.  She feels much better.  She quit smoking over a month ago.  OV 07/21/2022: Here today for follow-up.  Unfortunately she had multiple exacerbations this past year requiring antibiotics and steroids.  She is currently using Breztri twice a day as needed albuterol.  She recently just completed another course of prednisone.  She has cough sputum production tach chest tightness and wheezing  OV 11/03/2022: Here today for COPD follow-up.  She was seen by Rhunette Croft, NP in November twice since seeing me in October.  She did not tolerate the azithromycin but she only took it for 4 days she felt like she could not breathe.  But I am not sure this was related to her taking this medication or not as she is on had ongoing issues with exacerbations related to her COPD.  She still has trouble with breathing chemical smells and dust at work.  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  She is kind of back to square 1 with just being on nebulized treatments and her inhalers.  She still has chest tightness cough sputum production.  She was given Daliresp in the past and did not tolerate Daliresp due to diarrhea.  OV 02/13/2023: Here today for  COPD follow-up.  Unfortunately she did not like tolerating the azithromycin Monday Wednesday Friday.  She did not tolerate Daliresp.  She is still using her Breztri.  We talked about evaluation for asthma COPD overlap since she does have previous elevated eosinophils.  Would consider ordering repeat lab work and RAST panel plus IgE.  But patient does not want to use any injection meds.  I do not think doing the workup makes any sense if she is not willing to try anything else.  She seems to be rather defeated about this process.  She only wants to use her inhalers.  Yet she wants to feel better and breathe better but is not willing to move forward with other options at this time.    Past Medical History:  Diagnosis Date  Anxiety    Atherosclerosis    Colon polyp, hyperplastic    COPD (chronic obstructive pulmonary disease) (HCC)    Diverticulosis    Gallstone    Pneumonia      Family History  Problem Relation Age of Onset   Arthritis Mother    COPD Mother    Emphysema Mother    Stroke Father    Heart disease Father    Cancer Father        MELANOMA   Breast cancer Maternal Grandmother    Colon cancer Neg Hx      Past Surgical History:  Procedure Laterality Date   ABDOMINAL HYSTERECTOMY     BRONCHIAL BIOPSY  11/03/2020   Procedure: BRONCHIAL BIOPSIES;  Surgeon: Josephine Igo, DO;  Location: [facility];  Service: Pulmonary;;   BRONCHIAL BRUSHINGS  11/03/2020   Procedure: BRONCHIAL BRUSHINGS;  Surgeon: Josephine Igo, DO;  Location: [facility];  Service: Pulmonary;;   BRONCHIAL NEEDLE ASPIRATION BIOPSY  11/03/2020   Procedure: BRONCHIAL NEEDLE ASPIRATION BIOPSIES;  Surgeon: Josephine Igo, DO;  Location: [facility];  Service: Pulmonary;;   BRONCHIAL WASHINGS  11/03/2020   Procedure: BRONCHIAL WASHINGS;  Surgeon: Josephine Igo, DO;  Location: [facility];  Service: Pulmonary;;   CESAREAN SECTION     CHOLECYSTECTOMY     COLONOSCOPY     FIDUCIAL MARKER PLACEMENT   11/03/2020   Procedure: FIDUCIAL MARKER PLACEMENT;  Surgeon: Josephine Igo, DO;  Location: [facility];  Service: Pulmonary;;   VIDEO BRONCHOSCOPY WITH ENDOBRONCHIAL NAVIGATION N/A 11/03/2020   Procedure: VIDEO BRONCHOSCOPY WITH ENDOBRONCHIAL NAVIGATION AND POSSIBLE ULTRASOUND;  Surgeon: Josephine Igo, DO;  Location: [facility];  Service: Pulmonary;  Laterality: N/A;   VIDEO BRONCHOSCOPY WITH ENDOBRONCHIAL ULTRASOUND  11/03/2020   Procedure: VIDEO BRONCHOSCOPY WITH ENDOBRONCHIAL ULTRASOUND;  Surgeon: Josephine Igo, DO;  Location: [facility];  Service: Pulmonary;;    Social History   Socioeconomic History   Marital status: Divorced    Spouse name: Not on file   Number of children: Not on file   Years of education: Not on file   Highest education level: Not on file  Occupational History   Not on file  Tobacco Use   Smoking status: Former    Types: Cigarettes    Quit date: 12/15/2021    Years since quitting: 1.1   Smokeless tobacco: Never   Tobacco comments:    Pt states she quit smoking in March 2023. 07/21/2022 [place]  Substance and Sexual Activity   Alcohol use: No    Alcohol/week: 0.0 standard drinks of alcohol   Drug use: No   Sexual activity: Never    Comment: 1ST INTERCOURSE- 16, PARTNERS- 5  Other Topics Concern   Not on file  Social History Narrative   Not on file   Social Determinants of Health   Financial Resource Strain: Low Risk  (01/31/2022)   Overall Financial Resource Strain (CARDIA)    Difficulty of Paying Living Expenses: Not hard at all  Food Insecurity: No Food Insecurity (01/31/2022)   Hunger Vital Sign    Worried About Running Out of Food in the Last Year: Never true    Ran Out of Food in the Last Year: Never true  Transportation Needs: No Transportation Needs (01/31/2022)   PRAPARE - Administrator, Civil Service (Medical): No    Lack of Transportation (Non-Medical): No  Physical Activity: Not on file  Stress: No Stress Concern  Present (01/31/2022)   [place]  Institute of Occupational Health - Occupational Stress Questionnaire    Feeling of Stress : Not at all  Social Connections: Moderately Integrated (01/31/2022)   Social Connection and Isolation Panel [NHANES]    Frequency of Communication with Friends and Family: More than three times a week    Frequency of Social Gatherings with Friends and Family: More than three times a week    Attends Religious Services: 1 to 4 times per year    Active Member of [employer] or Organizations: Yes    Attends Banker Meetings: 1 to 4 times per year    Marital Status: Divorced  Catering manager Violence: Not At Risk (01/31/2022)   Humiliation, Afraid, Rape, and Kick questionnaire    Fear of Current or Ex-Partner: No    Emotionally Abused: No    Physically Abused: No    Sexually Abused: No     Allergies  Allergen Reactions   Codeine Nausea And Vomiting   Erythromycin Nausea And Vomiting   Ampicillin Nausea And Vomiting, Rash and Other (See Comments)    Pt states that rash was on her tongue.  Has patient had a PCN reaction causing immediate rash, facial/tongue/throat swelling, SOB or lightheadedness with hypotension yes Has patient had a PCN reaction causing severe rash involving mucus membranes or skin necrosis: no Has patient had a PCN reaction that required hospitalization yes Has patient had a PCN reaction occurring within the last 10 years: no If all of the above answers are "NO", then may proceed with Cephalosporin use.      Outpatient Medications Prior to Visit  Medication Sig Dispense Refill   albuterol (PROVENTIL) (2.5 MG/3ML) 0.083% nebulizer solution USE 3 ML VIA NEBULIZER EVERY 4 HOURS AS NEEDED FOR WHEEZING OR SHORTNESS OF BREATH 75 mL 5   ALPRAZolam (XANAX) 0.25 MG tablet 1 tab yb mouth twice per day as needed 60 tablet 3   Budeson-Glycopyrrol-Formoterol (BREZTRI AEROSPHERE) 160-9-4.8 MCG/ACT AERO Inhale 2 puffs into the lungs in the morning and at  bedtime. 32.1 g 3   cyanocobalamin 2000 MCG tablet Take 1 tablet (2,000 mcg total) by mouth daily. 90 tablet 1   VENTOLIN HFA 108 (90 Base) MCG/ACT inhaler INHALE 1 TO 2 PUFFS INTO THE LUNGS EVERY 6 HOURS AS NEEDED FOR WHEEZING OR SHORTNESS OF BREATH 18 g 3   Calcium Carbonate-Vitamin D (CALCIUM PLUS VITAMIN D PO) Take 1 tablet by mouth daily. 1200mg  calcium and 1000 units vitamin D (Patient not taking: Reported on 12/05/2022)     No facility-administered medications prior to visit.    Review of Systems  Constitutional:  Negative for chills, fever, malaise/fatigue and weight loss.  HENT:  Negative for hearing loss, sore throat and tinnitus.   Eyes:  Negative for blurred vision and double vision.  Respiratory:  Positive for cough, shortness of breath and wheezing. Negative for hemoptysis, sputum production and stridor.   Cardiovascular:  Negative for chest pain, palpitations, orthopnea, leg swelling and PND.  Gastrointestinal:  Negative for abdominal pain, constipation, diarrhea, heartburn, nausea and vomiting.  Genitourinary:  Negative for dysuria, hematuria and urgency.  Musculoskeletal:  Negative for joint pain and myalgias.  Skin:  Negative for itching and rash.  Neurological:  Negative for dizziness, tingling, weakness and headaches.  Endo/Heme/Allergies:  Negative for environmental allergies. Does not bruise/bleed easily.  Psychiatric/Behavioral:  Negative for depression. The patient is not nervous/anxious and does not have insomnia.   All other systems reviewed and are negative.    Objective:  Physical Exam  Vitals reviewed.  Constitutional:      General: She is not in acute distress.    Appearance: She is well-developed.  HENT:     Head: Normocephalic and atraumatic.     Mouth/Throat:     Pharynx: No oropharyngeal exudate.  Eyes:     Conjunctiva/sclera: Conjunctivae normal.     Pupils: Pupils are equal, round, and reactive to light.  Neck:     Vascular: No JVD.      Trachea: No tracheal deviation.     Comments: Loss of supraclavicular fat Cardiovascular:     Rate and Rhythm: Normal rate and regular rhythm.     Heart sounds: S1 normal and S2 normal.     Comments: Distant heart tones Pulmonary:     Effort: No tachypnea or accessory muscle usage.     Breath sounds: No stridor. Decreased breath sounds (throughout all lung fields) present. No wheezing, rhonchi or rales.     Comments: Severely diminished breath sounds bilaterally. Abdominal:     General: Bowel sounds are normal. There is no distension.     Palpations: Abdomen is soft.     Tenderness: There is no abdominal tenderness.  Musculoskeletal:        General: Deformity (muscle wasting ) present.  Skin:    General: Skin is warm and dry.     Capillary Refill: Capillary refill takes less than 2 seconds.     Findings: No rash.  Neurological:     Mental Status: She is alert and oriented to person, place, and time.  Psychiatric:        Behavior: Behavior normal.      Vitals:   02/13/23 0833  BP: 130/80  Pulse: 85  SpO2: 100%  Weight: 142 lb 9.6 oz (64.7 kg)  Height: 5\' 2"  (1.575 m)   100% on RA BMI Readings from Last 3 Encounters:  02/13/23 26.08 kg/m  12/05/22 26.34 kg/m  11/03/22 26.85 kg/m   Wt Readings from Last 3 Encounters:  02/13/23 142 lb 9.6 oz (64.7 kg)  12/05/22 144 lb (65.3 kg)  11/03/22 146 lb 12.8 oz (66.6 kg)     CBC    Component Value Date/Time   WBC 9.5 12/22/2021 1500   RBC 4.60 12/22/2021 1500   HGB 14.2 12/22/2021 1500   HCT 42.8 12/22/2021 1500   PLT 306.0 12/22/2021 1500   MCV 93.0 12/22/2021 1500   MCH 30.8 11/22/2016 0832   MCHC 33.3 12/22/2021 1500   RDW 13.9 12/22/2021 1500   LYMPHSABS 2.5 12/22/2021 1500   MONOABS 0.5 12/22/2021 1500   EOSABS 0.0 12/22/2021 1500   BASOSABS 0.1 12/22/2021 1500    Chest Imaging: 07/24/2018 -CT chest with evidence of mild upper lobe predominant emphysematous change..  01/15/2021: CT chest stable left upper  lobe nodule, 6 mm right middle lobe nodule. The patient's images have been independently reviewed by me.    October 2022: CT scan of the chest reveals stability of the left upper lobe scarring.  Lung nodules have somewhat dissipated. The patient's images have been independently reviewed by me.    Chest x-ray 07/08/2022: No infiltrate no pneumonia. Evidence of hyperinflation. The patient's images have been independently reviewed by me.    Pulmonary Functions Testing Results:    Latest Ref Rng & Units 10/24/2018   11:36 AM  PFT Results  FVC-Pre L 2.22   FVC-Predicted Pre % 74   FVC-Post L 2.15   FVC-Predicted Post % 72   Pre FEV1/FVC % %  52   Post FEV1/FCV % % 50   FEV1-Pre L 1.17   FEV1-Predicted Pre % 51   FEV1-Post L 1.08   DLCO uncorrected ml/min/mmHg 15.91   DLCO UNC% % 73   DLVA Predicted % 83   TLC L 6.19   TLC % Predicted % 130   RV % Predicted % 190     FeNO: None   Pathology:   Bronchscopy: LUL nodules, negative formalignancy   Echocardiogram: None   Heart Catheterization: None    Assessment & Plan:   COPD with chronic bronchitis and emphysema (HCC)  Centrilobular emphysema (HCC)  Former smoker  Lung nodules  Discussion:  This is a 67 year old female, severe COPD FEV1 of 1.08 L, 47% predicted, abnormal lung cancer screening CT in the past nondiagnostic with negative for malignancy however follow-up CT imaging showed nodules beginning to regress and presumed inflammatory.  She still has recurrent chronic bronchitis symptoms.  We have tried Daliresp that she did not tolerate, we tried azithromycin Monday Wednesday Friday that she did not tolerate.  She is currently using triple therapy inhaler regimen.  Plan: Continue inhaler regimen We talked about workup for potential biologic injections for asthma COPD overlap syndrome.  She does have previously elevated eosinophils.  But patient is not wanting to consider injection drugs. Recommended taking a  daily antihistamine. At this time there is not many additional things to offer if she is not willing to pursue any of the other treatment options. She can continue her inhalers. Patient is high risk for recurrent exacerbations or even hospitalization. Patient to follow-up with [place] in the future as needed for COPD management or in 6 months.    Current Outpatient Medications:    albuterol (PROVENTIL) (2.5 MG/3ML) 0.083% nebulizer solution, USE 3 ML VIA NEBULIZER EVERY 4 HOURS AS NEEDED FOR WHEEZING OR SHORTNESS OF BREATH, Disp: 75 mL, Rfl: 5   ALPRAZolam (XANAX) 0.25 MG tablet, 1 tab yb mouth twice per day as needed, Disp: 60 tablet, Rfl: 3   Budeson-Glycopyrrol-Formoterol (BREZTRI AEROSPHERE) 160-9-4.8 MCG/ACT AERO, Inhale 2 puffs into the lungs in the morning and at bedtime., Disp: 32.1 g, Rfl: 3   cyanocobalamin 2000 MCG tablet, Take 1 tablet (2,000 mcg total) by mouth daily., Disp: 90 tablet, Rfl: 1   VENTOLIN HFA 108 (90 Base) MCG/ACT inhaler, INHALE 1 TO 2 PUFFS INTO THE LUNGS EVERY 6 HOURS AS NEEDED FOR WHEEZING OR SHORTNESS OF BREATH, Disp: 18 g, Rfl: 3   Calcium Carbonate-Vitamin D (CALCIUM PLUS VITAMIN D PO), Take 1 tablet by mouth daily. 1200mg  calcium and 1000 units vitamin D (Patient not taking: Reported on 12/05/2022), Disp: , Rfl:     Josephine Igo, DO [facility] 02/13/2023 9:25 AM

## 2023-02-13 NOTE — Patient Instructions (Addendum)
Thank you for visiting Dr. Tonia Brooms at [facility]. Today we recommend the following:  Continue current inhaler regimen.  Follow up with PCP regarding back pain evaluation   Return in about 3 months (around 05/15/2023) for with APP.    Please do your part to reduce the spread of COVID-19.

## 2023-02-16 ENCOUNTER — Ambulatory Visit (INDEPENDENT_AMBULATORY_CARE_PROVIDER_SITE_OTHER): Payer: Medicare Other

## 2023-02-16 VITALS — Ht 62.0 in | Wt 142.6 lb

## 2023-02-16 DIAGNOSIS — Z0000 Encounter for general adult medical examination without abnormal findings: Secondary | ICD-10-CM

## 2023-02-16 NOTE — Patient Instructions (Addendum)
Lynn Kline , Thank you for taking time to come for your Medicare Wellness Visit. I appreciate your ongoing commitment to your health goals. Please review the following plan we discussed and let me know if I can assist you in the future.   These are the goals we discussed:  Goals      Prevent Falls and Broken Bones-Osteopenia     Timeframe:  Long-Range Goal Priority:  High Start Date:  07/07/2021                          Expected End Date: 01/2024                     Follow Up Date 02/2024   - always use handrails on the stairs - always wear shoes or slippers with non-slip sole - get at least 10 minutes of activity every day - keep cell phone with me always - make an emergency alert plan in case I fall - pick up clutter from the floors - remove, or use a non-slip pad, with my throw rugs - wear low heeled or flat shoes with non-skid soles    Why is this important?   When you fall, there are 3 things that control if a bone breaks or not.  These are the fall itself, how hard and the direction that you fall and how fragile your bones are.  Preventing falls is very important for you because of fragile bones.       Track and Manage My Symptoms-COPD     Timeframe:  Long-Range Goal Priority:  High Start Date:  07/07/2021                          Expected End Date: 01/2024             Follow Up Date 02/2024   - develop a rescue plan - eliminate symptom triggers at home - keep follow-up appointments    Why is this important?   Tracking your symptoms and other information about your health helps your doctor plan your care.  Write down the symptoms, the time of day, what you were doing and what medicine you are taking.  You will soon learn how to manage your symptoms.          This is a list of the screening recommended for you and due dates:  Health Maintenance  Topic Date Due   Zoster (Shingles) Vaccine (1 of 2) Never done   Mammogram  01/18/2014   Colon Cancer Screening   03/20/2022   COVID-19 Vaccine (3 - 2023-24 season) 06/17/2022   Flu Shot  05/18/2023   DTaP/Tdap/Td vaccine (3 - Td or Tdap) 09/23/2023   Medicare Annual Wellness Visit  02/16/2024   Pneumonia Vaccine  Completed   DEXA scan (bone density measurement)  Completed   Hepatitis C Screening: USPSTF Recommendation to screen - Ages 40-79 yo.  Completed   HPV Vaccine  Aged Out    Advanced directives: No  Conditions/risks identified: Yes  Next appointment: Follow up in one year for your annual wellness visit.   Preventive Care 66 Years and Older, Female Preventive care refers to lifestyle choices and visits with your health care provider that can promote health and wellness. What does preventive care include? A yearly physical exam. This is also called an annual well check. Dental exams once or twice a year. Routine eye  exams. Ask your health care provider how often you should have your eyes checked. Personal lifestyle choices, including: Daily care of your teeth and gums. Regular physical activity. Eating a healthy diet. Avoiding tobacco and drug use. Limiting alcohol use. Practicing safe sex. Taking low-dose aspirin every day. Taking vitamin and mineral supplements as recommended by your health care provider. What happens during an annual well check? The services and screenings done by your health care provider during your annual well check will depend on your age, overall health, lifestyle risk factors, and family history of disease. Counseling  Your health care provider may ask you questions about your: Alcohol use. Tobacco use. Drug use. Emotional well-being. Home and relationship well-being. Sexual activity. Eating habits. History of falls. Memory and ability to understand (cognition). Work and work Astronomer. Reproductive health. Screening  You may have the following tests or measurements: Height, weight, and BMI. Blood pressure. Lipid and cholesterol levels. These  may be checked every 5 years, or more frequently if you are over 59 years old. Skin check. Lung cancer screening. You may have this screening every year starting at age 55 if you have a 30-pack-year history of smoking and currently smoke or have quit within the past 15 years. Fecal occult blood test (FOBT) of the stool. You may have this test every year starting at age 34. Flexible sigmoidoscopy or colonoscopy. You may have a sigmoidoscopy every 5 years or a colonoscopy every 10 years starting at age 37. Hepatitis C blood test. Hepatitis B blood test. Sexually transmitted disease (STD) testing. Diabetes screening. This is done by checking your blood sugar (glucose) after you have not eaten for a while (fasting). You may have this done every 1-3 years. Bone density scan. This is done to screen for osteoporosis. You may have this done starting at age 25. Mammogram. This may be done every 1-2 years. Talk to your health care provider about how often you should have regular mammograms. Talk with your health care provider about your test results, treatment options, and if necessary, the need for more tests. Vaccines  Your health care provider may recommend certain vaccines, such as: Influenza vaccine. This is recommended every year. Tetanus, diphtheria, and acellular pertussis (Tdap, Td) vaccine. You may need a Td booster every 10 years. Zoster vaccine. You may need this after age 108. Pneumococcal 13-valent conjugate (PCV13) vaccine. One dose is recommended after age 50. Pneumococcal polysaccharide (PPSV23) vaccine. One dose is recommended after age 28. Talk to your health care provider about which screenings and vaccines you need and how often you need them. This information is not intended to replace advice given to you by your health care provider. Make sure you discuss any questions you have with your health care provider. Document Released: 10/30/2015 Document Revised: 06/22/2016 Document  Reviewed: 08/04/2015 Elsevier Interactive Patient Education  2017 [employer].  Fall Prevention in the Home Falls can cause injuries. They can happen to people of all ages. There are many things you can do to make your home safe and to help prevent falls. What can I do on the outside of my home? Regularly fix the edges of walkways and driveways and fix any cracks. Remove anything that might make you trip as you walk through a door, such as a raised step or threshold. Trim any bushes or trees on the path to your home. Use bright outdoor lighting. Clear any walking paths of anything that might make someone trip, such as rocks or tools. Regularly check  to see if handrails are loose or broken. Make sure that both sides of any steps have handrails. Any raised decks and porches should have guardrails on the edges. Have any leaves, snow, or ice cleared regularly. Use sand or salt on walking paths during winter. Clean up any spills in your garage right away. This includes oil or grease spills. What can I do in the bathroom? Use night lights. Install grab bars by the toilet and in the tub and shower. Do not use towel bars as grab bars. Use non-skid mats or decals in the tub or shower. If you need to sit down in the shower, use a plastic, non-slip stool. Keep the floor dry. Clean up any water that spills on the floor as soon as it happens. Remove soap buildup in the tub or shower regularly. Attach bath mats securely with double-sided non-slip rug tape. Do not have throw rugs and other things on the floor that can make you trip. What can I do in the bedroom? Use night lights. Make sure that you have a light by your bed that is easy to reach. Do not use any sheets or blankets that are too big for your bed. They should not hang down onto the floor. Have a firm chair that has side arms. You can use this for support while you get dressed. Do not have throw rugs and other things on the floor that can  make you trip. What can I do in the kitchen? Clean up any spills right away. Avoid walking on wet floors. Keep items that you use a lot in easy-to-reach places. If you need to reach something above you, use a strong step stool that has a grab bar. Keep electrical cords out of the way. Do not use floor polish or wax that makes floors slippery. If you must use wax, use non-skid floor wax. Do not have throw rugs and other things on the floor that can make you trip. What can I do with my stairs? Do not leave any items on the stairs. Make sure that there are handrails on both sides of the stairs and use them. Fix handrails that are broken or loose. Make sure that handrails are as long as the stairways. Check any carpeting to make sure that it is firmly attached to the stairs. Fix any carpet that is loose or worn. Avoid having throw rugs at the top or bottom of the stairs. If you do have throw rugs, attach them to the floor with carpet tape. Make sure that you have a light switch at the top of the stairs and the bottom of the stairs. If you do not have them, ask someone to add them for you. What else can I do to help prevent falls? Wear shoes that: Do not have high heels. Have rubber bottoms. Are comfortable and fit you well. Are closed at the toe. Do not wear sandals. If you use a stepladder: Make sure that it is fully opened. Do not climb a closed stepladder. Make sure that both sides of the stepladder are locked into place. Ask someone to hold it for you, if possible. Clearly mark and make sure that you can see: Any grab bars or handrails. First and last steps. Where the edge of each step is. Use tools that help you move around (mobility aids) if they are needed. These include: Canes. Walkers. Scooters. Crutches. Turn on the lights when you go into a dark area. Replace any light bulbs as  soon as they burn out. Set up your furniture so you have a clear path. Avoid moving your furniture  around. If any of your floors are uneven, fix them. If there are any pets around you, be aware of where they are. Review your medicines with your doctor. Some medicines can make you feel dizzy. This can increase your chance of falling. Ask your doctor what other things that you can do to help prevent falls. This information is not intended to replace advice given to you by your health care provider. Make sure you discuss any questions you have with your health care provider. Document Released: 07/30/2009 Document Revised: 03/10/2016 Document Reviewed: 11/07/2014 Elsevier Interactive Patient Education  2017 [employer].

## 2023-02-16 NOTE — Progress Notes (Signed)
I connected with  [name] on 02/16/23 by a audio enabled telemedicine application and verified that I am speaking with the correct person using two identifiers.  Patient Location: Home  Provider Location: Office/Clinic  I discussed the limitations of evaluation and management by telemedicine. The patient expressed understanding and agreed to proceed.  Subjective:   TOMMY MINICHIELLO is a 67 y.o. female who presents for Medicare Annual (Subsequent) preventive examination.  Review of Systems     Cardiac Risk Factors include: advanced age (>64men, >55 women);family history of premature cardiovascular disease     Objective:    Today's Vitals   02/16/23 1612 02/16/23 1613  Weight: 142 lb 10.2 oz (64.7 kg)   Height: 5\' 2"  (1.575 m)   PainSc: 0-No pain 0-No pain   Body mass index is 26.09 kg/m.     02/16/2023    4:22 PM 01/31/2022    9:10 AM 11/03/2020    7:26 AM 11/22/2016    7:48 AM 10/30/2014   10:38 AM 08/07/2014   10:05 AM  Advanced Directives  Does Patient Have a Medical Advance Directive? No No No No No No  Would patient like information on creating a medical advance directive? No - Patient declined Yes (MAU/Ambulatory/Procedural Areas - Information given) No - Patient declined       Current Medications (verified) Outpatient Encounter Medications as of 02/16/2023  Medication Sig   albuterol (PROVENTIL) (2.5 MG/3ML) 0.083% nebulizer solution USE 3 ML VIA NEBULIZER EVERY 4 HOURS AS NEEDED FOR WHEEZING OR SHORTNESS OF BREATH   ALPRAZolam (XANAX) 0.25 MG tablet 1 tab yb mouth twice per day as needed   Budeson-Glycopyrrol-Formoterol (BREZTRI AEROSPHERE) 160-9-4.8 MCG/ACT AERO Inhale 2 puffs into the lungs in the morning and at bedtime.   cyanocobalamin 2000 MCG tablet Take 1 tablet (2,000 mcg total) by mouth daily.   VENTOLIN HFA 108 (90 Base) MCG/ACT inhaler INHALE 1 TO 2 PUFFS INTO THE LUNGS EVERY 6 HOURS AS NEEDED FOR WHEEZING OR SHORTNESS OF BREATH   Calcium  Carbonate-Vitamin D (CALCIUM PLUS VITAMIN D PO) Take 1 tablet by mouth daily. 1200mg  calcium and 1000 units vitamin D (Patient not taking: Reported on 12/05/2022)   No facility-administered encounter medications on file as of 02/16/2023.    Allergies (verified) Codeine, Erythromycin, and Ampicillin   History: Past Medical History:  Diagnosis Date   Anxiety    Atherosclerosis    Colon polyp, hyperplastic    COPD (chronic obstructive pulmonary disease) (HCC)    Diverticulosis    Gallstone    Pneumonia    Past Surgical History:  Procedure Laterality Date   ABDOMINAL HYSTERECTOMY     BRONCHIAL BIOPSY  11/03/2020   Procedure: BRONCHIAL BIOPSIES;  Surgeon: Josephine Igo, DO;  Location: [facility];  Service: Pulmonary;;   BRONCHIAL BRUSHINGS  11/03/2020   Procedure: BRONCHIAL BRUSHINGS;  Surgeon: Josephine Igo, DO;  Location: [facility];  Service: Pulmonary;;   BRONCHIAL NEEDLE ASPIRATION BIOPSY  11/03/2020   Procedure: BRONCHIAL NEEDLE ASPIRATION BIOPSIES;  Surgeon: Josephine Igo, DO;  Location: [facility];  Service: Pulmonary;;   BRONCHIAL WASHINGS  11/03/2020   Procedure: BRONCHIAL WASHINGS;  Surgeon: Josephine Igo, DO;  Location: [facility];  Service: Pulmonary;;   CESAREAN SECTION     CHOLECYSTECTOMY     COLONOSCOPY     FIDUCIAL MARKER PLACEMENT  11/03/2020   Procedure: FIDUCIAL MARKER PLACEMENT;  Surgeon: Josephine Igo, DO;  Location: [facility];  Service: Pulmonary;;   VIDEO BRONCHOSCOPY  WITH ENDOBRONCHIAL NAVIGATION N/A 11/03/2020   Procedure: VIDEO BRONCHOSCOPY WITH ENDOBRONCHIAL NAVIGATION AND POSSIBLE ULTRASOUND;  Surgeon: Josephine Igo, DO;  Location: [facility];  Service: Pulmonary;  Laterality: N/A;   VIDEO BRONCHOSCOPY WITH ENDOBRONCHIAL ULTRASOUND  11/03/2020   Procedure: VIDEO BRONCHOSCOPY WITH ENDOBRONCHIAL ULTRASOUND;  Surgeon: Josephine Igo, DO;  Location: [facility];  Service: Pulmonary;;   Family History  Problem Relation Age of Onset    Arthritis Mother    COPD Mother    Emphysema Mother    Stroke Father    Heart disease Father    Cancer Father        MELANOMA   Breast cancer Maternal Grandmother    Colon cancer Neg Hx    Social History   Socioeconomic History   Marital status: Divorced    Spouse name: Not on file   Number of children: Not on file   Years of education: Not on file   Highest education level: Not on file  Occupational History   Not on file  Tobacco Use   Smoking status: Former    Types: Cigarettes    Quit date: 12/15/2021    Years since quitting: 1.1   Smokeless tobacco: Never   Tobacco comments:    Pt states she quit smoking in March 2023. 07/21/2022 Tay  Substance and Sexual Activity   Alcohol use: No    Alcohol/week: 0.0 standard drinks of alcohol   Drug use: No   Sexual activity: Never    Comment: 1ST INTERCOURSE- 16, PARTNERS- 5  Other Topics Concern   Not on file  Social History Narrative   Not on file   Social Determinants of Health   Financial Resource Strain: Low Risk  (02/16/2023)   Overall Financial Resource Strain (CARDIA)    Difficulty of Paying Living Expenses: Not very hard  Food Insecurity: No Food Insecurity (02/16/2023)   Hunger Vital Sign    Worried About Running Out of Food in the Last Year: Never true    Ran Out of Food in the Last Year: Never true  Transportation Needs: No Transportation Needs (02/16/2023)   PRAPARE - Administrator, Civil Service (Medical): No    Lack of Transportation (Non-Medical): No  Physical Activity: Sufficiently Active (02/16/2023)   Exercise Vital Sign    Days of Exercise per Week: 7 days    Minutes of Exercise per Session: 150+ min  Stress: No Stress Concern Present (02/16/2023)   [employer] of Occupational Health - Occupational Stress Questionnaire    Feeling of Stress : Only a little  Social Connections: Unknown (02/16/2023)   Social Connection and Isolation Panel [NHANES]    Frequency of Communication with Friends  and Family: More than three times a week    Frequency of Social Gatherings with Friends and Family: More than three times a week    Attends Religious Services: Patient unable to answer    Active Member of Clubs or Organizations: No    Attends Banker Meetings: Never    Marital Status: Divorced    Tobacco Counseling Counseling given: Not Answered Tobacco comments: Pt states she quit smoking in March 2023. 07/21/2022 Tay   Clinical Intake:  Pre-visit preparation completed: Yes  Pain : No/denies pain Pain Score: 0-No pain     BMI - recorded: 26.09 Nutritional Risks: None Diabetes: No  How often do you need to have someone help you when you read instructions, pamphlets, or other written materials from your doctor or  pharmacy?: 1 - Never What is the last grade level you completed in school?: HSG  Diabetic? No  Interpreter Needed?: No  Information entered by :: Susie Cassette, LPN.   Activities of Daily Living    02/16/2023    4:23 PM 02/12/2023    2:08 PM  In your present state of health, do you have any difficulty performing the following activities:  Hearing? 0 0  Vision? 0 0  Difficulty concentrating or making decisions? 0 0  Walking or climbing stairs? 1 1  Dressing or bathing? 0 0  Doing errands, shopping? 0 0  Preparing Food and eating ? N N  Using the Toilet? N N  In the past six months, have you accidently leaked urine? N N  Do you have problems with loss of bowel control? N N  Managing your Medications? N N  Managing your Finances? N N  Housekeeping or managing your Housekeeping? N N    Patient Care Team: Etta Grandchild, MD as PCP - General (Internal Medicine) Szabat, Vinnie Level, [facility] (Inactive) as Pharmacist (Pharmacist)  Indicate any recent Medical Services you may have received from other than [facility] providers in the past year (date may be approximate).     Assessment:   This is a routine wellness examination for Alvira.  Hearing/Vision  screen Hearing Screening - Comments:: Denies hearing difficulties   Vision Screening - Comments:: Wears rx glasses - up to date with routine eye exams with [facility]; in process of finding a new ophthalmologist.   Dietary issues and exercise activities discussed: Current Exercise Habits: Home exercise routine, Type of exercise: walking;treadmill;stretching;strength training/weights;exercise ball;calisthenics, Time (Minutes): > 60, Frequency (Times/Week): 7, Weekly Exercise (Minutes/Week): 0, Intensity: Moderate, Exercise limited by: respiratory conditions(s)   Goals Addressed             This Visit's Progress    Prevent Falls and Broken Bones-Osteopenia       Timeframe:  Long-Range Goal Priority:  High Start Date:  07/07/2021                          Expected End Date: 01/2024                     Follow Up Date 02/2024   - always use handrails on the stairs - always wear shoes or slippers with non-slip sole - get at least 10 minutes of activity every day - keep cell phone with me always - make an emergency alert plan in case I fall - pick up clutter from the floors - remove, or use a non-slip pad, with my throw rugs - wear low heeled or flat shoes with non-skid soles    Why is this important?   When you fall, there are 3 things that control if a bone breaks or not.  These are the fall itself, how hard and the direction that you fall and how fragile your bones are.  Preventing falls is very important for you because of fragile bones.       Track and Manage My Symptoms-COPD       Timeframe:  Long-Range Goal Priority:  High Start Date:  07/07/2021                          Expected End Date: 01/2024             Follow Up Date 02/2024   -  develop a rescue plan - eliminate symptom triggers at home - keep follow-up appointments    Why is this important?   Tracking your symptoms and other information about your health helps your doctor plan your care.  Write down the  symptoms, the time of day, what you were doing and what medicine you are taking.  You will soon learn how to manage your symptoms.        Depression Screen    02/16/2023    4:20 PM 12/05/2022    3:32 PM 07/08/2022    3:51 PM 01/31/2022    9:12 AM 12/22/2021    1:52 PM 10/01/2020    8:51 AM 09/23/2019    3:34 PM  PHQ 2/9 Scores  PHQ - 2 Score 0 0  0 1 0 0  PHQ- 9 Score 0        Exception Documentation   Patient refusal        Fall Risk    02/16/2023    4:23 PM 02/12/2023    2:08 PM 12/05/2022    3:32 PM 07/08/2022    3:51 PM 01/31/2022    9:12 AM  Fall Risk   Falls in the past year? 0 0 0 0 0  Number falls in past yr: 0 0 0  0  Injury with Fall? 0 0 0  0  Risk for fall due to : No Fall Risks  No Fall Risks  No Fall Risks  Follow up Falls prevention discussed  Falls evaluation completed  Falls evaluation completed    FALL RISK PREVENTION PERTAINING TO THE HOME:  Any stairs in or around the home? No  If so, are there any without handrails? No  Home free of loose throw rugs in walkways, pet beds, electrical cords, etc? Yes  Adequate lighting in your home to reduce risk of falls? Yes   ASSISTIVE DEVICES UTILIZED TO PREVENT FALLS:  Life alert? No  Use of a cane, walker or w/c? No  Grab bars in the bathroom? No  Shower chair or bench in shower? No  Elevated toilet seat or a handicapped toilet? No   TIMED UP AND GO:  Was the test performed? No . Telephonic Visit   Cognitive Function:        02/16/2023    4:25 PM 01/31/2022    9:13 AM  6CIT Screen  What Year? 0 points 0 points  What month? 0 points 0 points  What time? 0 points 0 points  Count back from 20 0 points 0 points  Months in reverse 0 points 0 points  Repeat phrase 0 points 0 points  Total Score 0 points 0 points    Immunizations Immunization History  Administered Date(s) Administered   DTaP 06/14/2005   Fluad Quad(high Dose 65+) 07/04/2020, 07/14/2021   Influenza Whole 10/23/2011   Influenza,inj,Quad  PF,6+ Mos 08/19/2014, 10/24/2018, 08/01/2019   PFIZER(Purple Top)SARS-COV-2 Vaccination 07/04/2020, 07/25/2020   Pneumococcal Conjugate-13 09/14/2005   Pneumococcal Polysaccharide-23 08/19/2014, 12/22/2021   Tdap 09/22/2013    TDAP status: Up to date  Flu Vaccine status: Declined, Education has been provided regarding the importance of this vaccine but patient still declined. Advised may receive this vaccine at local pharmacy or Health Dept. Aware to provide a copy of the vaccination record if obtained from local pharmacy or Health Dept. Verbalized acceptance and understanding.  Pneumococcal vaccine status: Up to date  Covid-19 vaccine status: Information provided on how to obtain vaccines.   Qualifies for Shingles Vaccine? Yes  Zostavax completed No   Shingrix Completed?: No.    Education has been provided regarding the importance of this vaccine. Patient has been advised to call insurance company to determine out of pocket expense if they have not yet received this vaccine. Advised may also receive vaccine at local pharmacy or Health Dept. Verbalized acceptance and understanding.  Screening Tests Health Maintenance  Topic Date Due   Zoster Vaccines- Shingrix (1 of 2) Never done   MAMMOGRAM  01/18/2014   COLONOSCOPY (Pts 45-59yrs Insurance coverage will need to be confirmed)  03/20/2022   COVID-19 Vaccine (3 - 2023-24 season) 06/17/2022   INFLUENZA VACCINE  05/18/2023   DTaP/Tdap/Td (3 - Td or Tdap) 09/23/2023   Medicare Annual Wellness (AWV)  02/16/2024   Pneumonia Vaccine 19+ Years old  Completed   DEXA SCAN  Completed   Hepatitis C Screening  Completed   HPV VACCINES  Aged Out    Health Maintenance  Health Maintenance Due  Topic Date Due   Zoster Vaccines- Shingrix (1 of 2) Never done   MAMMOGRAM  01/18/2014   COLONOSCOPY (Pts 45-15yrs Insurance coverage will need to be confirmed)  03/20/2022   COVID-19 Vaccine (3 - 2023-24 season) 06/17/2022    Colorectal cancer  screening: Type of screening: Colonoscopy. Completed 03/20/2012. Repeat every 10 years-patient declined.  Mammogram status: Completed 01/19/2012. Repeat every year-patient declined.  Bone Density status: Completed 08/24/2018. Results reflect: Bone density results: OSTEOPENIA. Repeat every 3 years.  Lung Cancer Screening: (Low Dose CT Chest recommended if Age 67-80 years, 30 pack-year currently smoking OR have quit w/in 15years.) does not qualify.   Lung Cancer Screening Referral: No  Additional Screening:  Hepatitis C Screening: does qualify; Completed: 09/23/2019  Vision Screening: Recommended annual ophthalmology exams for early detection of glaucoma and other disorders of the eye. Is the patient up to date with their annual eye exam?  Yes  Who is the provider or what is the name of the office in which the patient attends annual eye exams? [facility] If pt is not established with a provider, would they like to be referred to a provider to establish care? No .   Dental Screening: Recommended annual dental exams for proper oral hygiene  Community Resource Referral / Chronic Care Management: CRR required this visit?  No   CCM required this visit?  No      Plan:     I have personally reviewed and noted the following in the patient's chart:   Medical and social history Use of alcohol, tobacco or illicit drugs  Current medications and supplements including opioid prescriptions. Patient is not currently taking opioid prescriptions. Functional ability and status Nutritional status Physical activity Advanced directives List of other physicians Hospitalizations, surgeries, and ER visits in previous 12 months Vitals Screenings to include cognitive, depression, and falls Referrals and appointments  In addition, I have reviewed and discussed with patient certain preventive protocols, quality metrics, and best practice recommendations. A written personalized care plan for preventive  services as well as general preventive health recommendations were provided to patient.     Mickeal Needy, LPN   [id number]   Nurse Notes:  Normal cognitive status assessed by direct observation via telephone conversation by this Nurse Health Advisor. No abnormalities found.

## 2023-02-17 NOTE — Progress Notes (Addendum)
Medical screening examination/treatment/procedure(s) were performed by non-physician practitioner and as supervising physician I was immediately available for consultation/collaboration.  I agree with above. [name], MD      I connected with  Lynn Kline on 02/17/23 by a audio enabled telemedicine application and verified that I am speaking with the correct person using two identifiers.  Patient Location: Home  Provider Location: Office/Clinic  I discussed the limitations of evaluation and management by telemedicine. The patient expressed understanding and agreed to proceed.  Subjective:   Lynn Kline is a 67 y.o. female who presents for Medicare Annual (Subsequent) preventive examination.  Review of Systems     Cardiac Risk Factors include: advanced age (>18men, >33 women);family history of premature cardiovascular disease     Objective:    Today's Vitals   02/16/23 1612 02/16/23 1613  Weight: 142 lb 10.2 oz (64.7 kg)   Height: 5\' 2"  (1.575 m)   PainSc: 0-No pain 0-No pain   Body mass index is 26.09 kg/m.     02/16/2023    4:22 PM 01/31/2022    9:10 AM 11/03/2020    7:26 AM 11/22/2016    7:48 AM 10/30/2014   10:38 AM 08/07/2014   10:05 AM  Advanced Directives  Does Patient Have a Medical Advance Directive? No No No No No No  Would patient like information on creating a medical advance directive? No - Patient declined Yes (MAU/Ambulatory/Procedural Areas - Information given) No - Patient declined       Current Medications (verified) Outpatient Encounter Medications as of 02/16/2023  Medication Sig   albuterol (PROVENTIL) (2.5 MG/3ML) 0.083% nebulizer solution USE 3 ML VIA NEBULIZER EVERY 4 HOURS AS NEEDED FOR WHEEZING OR SHORTNESS OF BREATH   ALPRAZolam (XANAX) 0.25 MG tablet 1 tab yb mouth twice per day as needed   Budeson-Glycopyrrol-Formoterol (BREZTRI AEROSPHERE) 160-9-4.8 MCG/ACT AERO Inhale 2 puffs into the lungs in the morning and at bedtime.    cyanocobalamin 2000 MCG tablet Take 1 tablet (2,000 mcg total) by mouth daily.   VENTOLIN HFA 108 (90 Base) MCG/ACT inhaler INHALE 1 TO 2 PUFFS INTO THE LUNGS EVERY 6 HOURS AS NEEDED FOR WHEEZING OR SHORTNESS OF BREATH   Calcium Carbonate-Vitamin D (CALCIUM PLUS VITAMIN D PO) Take 1 tablet by mouth daily. 1200mg  calcium and 1000 units vitamin D (Patient not taking: Reported on 12/05/2022)   No facility-administered encounter medications on file as of 02/16/2023.    Allergies (verified) Codeine, Erythromycin, and Ampicillin   History: Past Medical History:  Diagnosis Date   Anxiety    Atherosclerosis    Colon polyp, hyperplastic    COPD (chronic obstructive pulmonary disease) (HCC)    Diverticulosis    Gallstone    Pneumonia    Past Surgical History:  Procedure Laterality Date   ABDOMINAL HYSTERECTOMY     BRONCHIAL BIOPSY  11/03/2020   Procedure: BRONCHIAL BIOPSIES;  Surgeon: Josephine Igo, DO;  Location: [facility];  Service: Pulmonary;;   BRONCHIAL BRUSHINGS  11/03/2020   Procedure: BRONCHIAL BRUSHINGS;  Surgeon: Josephine Igo, DO;  Location: [facility];  Service: Pulmonary;;   BRONCHIAL NEEDLE ASPIRATION BIOPSY  11/03/2020   Procedure: BRONCHIAL NEEDLE ASPIRATION BIOPSIES;  Surgeon: Josephine Igo, DO;  Location: [facility];  Service: Pulmonary;;   BRONCHIAL WASHINGS  11/03/2020   Procedure: BRONCHIAL WASHINGS;  Surgeon: Josephine Igo, DO;  Location: [facility];  Service: Pulmonary;;   CESAREAN SECTION     CHOLECYSTECTOMY     COLONOSCOPY  FIDUCIAL MARKER PLACEMENT  11/03/2020   Procedure: FIDUCIAL MARKER PLACEMENT;  Surgeon: Josephine Igo, DO;  Location: [facility];  Service: Pulmonary;;   VIDEO BRONCHOSCOPY WITH ENDOBRONCHIAL NAVIGATION N/A 11/03/2020   Procedure: VIDEO BRONCHOSCOPY WITH ENDOBRONCHIAL NAVIGATION AND POSSIBLE ULTRASOUND;  Surgeon: Josephine Igo, DO;  Location: [facility];  Service: Pulmonary;  Laterality: N/A;   VIDEO BRONCHOSCOPY WITH  ENDOBRONCHIAL ULTRASOUND  11/03/2020   Procedure: VIDEO BRONCHOSCOPY WITH ENDOBRONCHIAL ULTRASOUND;  Surgeon: Josephine Igo, DO;  Location: [facility];  Service: Pulmonary;;   Family History  Problem Relation Age of Onset   Arthritis Mother    COPD Mother    Emphysema Mother    Stroke Father    Heart disease Father    Cancer Father        MELANOMA   Breast cancer Maternal Grandmother    Colon cancer Neg Hx    Social History   Socioeconomic History   Marital status: Divorced    Spouse name: Not on file   Number of children: Not on file   Years of education: Not on file   Highest education level: Not on file  Occupational History   Not on file  Tobacco Use   Smoking status: Former    Types: Cigarettes    Quit date: 12/15/2021    Years since quitting: 1.1   Smokeless tobacco: Never   Tobacco comments:    Pt states she quit smoking in March 2023. 07/21/2022 Tay  Substance and Sexual Activity   Alcohol use: No    Alcohol/week: 0.0 standard drinks of alcohol   Drug use: No   Sexual activity: Never    Comment: 1ST INTERCOURSE- 16, PARTNERS- 5  Other Topics Concern   Not on file  Social History Narrative   Not on file   Social Determinants of Health   Financial Resource Strain: Low Risk  (02/16/2023)   Overall Financial Resource Strain (CARDIA)    Difficulty of Paying Living Expenses: Not very hard  Food Insecurity: No Food Insecurity (02/16/2023)   Hunger Vital Sign    Worried About Running Out of Food in the Last Year: Never true    Ran Out of Food in the Last Year: Never true  Transportation Needs: No Transportation Needs (02/16/2023)   PRAPARE - Administrator, Civil Service (Medical): No    Lack of Transportation (Non-Medical): No  Physical Activity: Sufficiently Active (02/16/2023)   Exercise Vital Sign    Days of Exercise per Week: 7 days    Minutes of Exercise per Session: 150+ min  Stress: No Stress Concern Present (02/16/2023)   [employer] of  Occupational Health - Occupational Stress Questionnaire    Feeling of Stress : Only a little  Social Connections: Unknown (02/16/2023)   Social Connection and Isolation Panel [NHANES]    Frequency of Communication with Friends and Family: More than three times a week    Frequency of Social Gatherings with Friends and Family: More than three times a week    Attends Religious Services: Patient unable to answer    Active Member of Clubs or Organizations: No    Attends Banker Meetings: Never    Marital Status: Divorced    Tobacco Counseling Counseling given: Not Answered Tobacco comments: Pt states she quit smoking in March 2023. 07/21/2022 Tay   Clinical Intake:  Pre-visit preparation completed: Yes  Pain : No/denies pain Pain Score: 0-No pain     BMI - recorded: 26.09 Nutritional  Risks: None Diabetes: No  How often do you need to have someone help you when you read instructions, pamphlets, or other written materials from your doctor or pharmacy?: 1 - Never What is the last grade level you completed in school?: HSG  Diabetic? No  Interpreter Needed?: No  Information entered by :: Susie Cassette, LPN.   Activities of Daily Living    02/16/2023    4:23 PM 02/12/2023    2:08 PM  In your present state of health, do you have any difficulty performing the following activities:  Hearing? 0 0  Vision? 0 0  Difficulty concentrating or making decisions? 0 0  Walking or climbing stairs? 1 1  Dressing or bathing? 0 0  Doing errands, shopping? 0 0  Preparing Food and eating ? N N  Using the Toilet? N N  In the past six months, have you accidently leaked urine? N N  Do you have problems with loss of bowel control? N N  Managing your Medications? N N  Managing your Finances? N N  Housekeeping or managing your Housekeeping? N N    Patient Care Team: Etta Grandchild, MD as PCP - General (Internal Medicine) Szabat, Vinnie Level, [facility] (Inactive) as Pharmacist  (Pharmacist)  Indicate any recent Medical Services you may have received from other than [facility] providers in the past year (date may be approximate).     Assessment:   This is a routine wellness examination for Daysi.  Hearing/Vision screen Hearing Screening - Comments:: Denies hearing difficulties   Vision Screening - Comments:: Wears rx glasses - up to date with routine eye exams with [facility]; in process of finding a new ophthalmologist.   Dietary issues and exercise activities discussed: Current Exercise Habits: Home exercise routine, Type of exercise: walking;treadmill;stretching;strength training/weights;exercise ball;calisthenics, Time (Minutes): > 60, Frequency (Times/Week): 7, Weekly Exercise (Minutes/Week): 0, Intensity: Moderate, Exercise limited by: respiratory conditions(s)   Goals Addressed             This Visit's Progress    Prevent Falls and Broken Bones-Osteopenia       Timeframe:  Long-Range Goal Priority:  High Start Date:  07/07/2021                          Expected End Date: 01/2024                     Follow Up Date 02/2024   - always use handrails on the stairs - always wear shoes or slippers with non-slip sole - get at least 10 minutes of activity every day - keep cell phone with me always - make an emergency alert plan in case I fall - pick up clutter from the floors - remove, or use a non-slip pad, with my throw rugs - wear low heeled or flat shoes with non-skid soles    Why is this important?   When you fall, there are 3 things that control if a bone breaks or not.  These are the fall itself, how hard and the direction that you fall and how fragile your bones are.  Preventing falls is very important for you because of fragile bones.       Track and Manage My Symptoms-COPD       Timeframe:  Long-Range Goal Priority:  High Start Date:  07/07/2021  Expected End Date: 01/2024             Follow Up Date 02/2024   -  develop a rescue plan - eliminate symptom triggers at home - keep follow-up appointments    Why is this important?   Tracking your symptoms and other information about your health helps your doctor plan your care.  Write down the symptoms, the time of day, what you were doing and what medicine you are taking.  You will soon learn how to manage your symptoms.        Depression Screen    02/16/2023    4:20 PM 12/05/2022    3:32 PM 07/08/2022    3:51 PM 01/31/2022    9:12 AM 12/22/2021    1:52 PM 10/01/2020    8:51 AM 09/23/2019    3:34 PM  PHQ 2/9 Scores  PHQ - 2 Score 0 0  0 1 0 0  PHQ- 9 Score 0        Exception Documentation   Patient refusal        Fall Risk    02/16/2023    4:23 PM 02/12/2023    2:08 PM 12/05/2022    3:32 PM 07/08/2022    3:51 PM 01/31/2022    9:12 AM  Fall Risk   Falls in the past year? 0 0 0 0 0  Number falls in past yr: 0 0 0  0  Injury with Fall? 0 0 0  0  Risk for fall due to : No Fall Risks  No Fall Risks  No Fall Risks  Follow up Falls prevention discussed  Falls evaluation completed  Falls evaluation completed    FALL RISK PREVENTION PERTAINING TO THE HOME:  Any stairs in or around the home? No  If so, are there any without handrails? No  Home free of loose throw rugs in walkways, pet beds, electrical cords, etc? Yes  Adequate lighting in your home to reduce risk of falls? Yes   ASSISTIVE DEVICES UTILIZED TO PREVENT FALLS:  Life alert? No  Use of a cane, walker or w/c? No  Grab bars in the bathroom? No  Shower chair or bench in shower? No  Elevated toilet seat or a handicapped toilet? No   TIMED UP AND GO:  Was the test performed? No . Telephonic Visit   Cognitive Function:        02/16/2023    4:25 PM 01/31/2022    9:13 AM  6CIT Screen  What Year? 0 points 0 points  What month? 0 points 0 points  What time? 0 points 0 points  Count back from 20 0 points 0 points  Months in reverse 0 points 0 points  Repeat phrase 0 points 0 points   Total Score 0 points 0 points    Immunizations Immunization History  Administered Date(s) Administered   DTaP 06/14/2005   Fluad Quad(high Dose 65+) 07/04/2020, 07/14/2021   Influenza Whole 10/23/2011   Influenza,inj,Quad PF,6+ Mos 08/19/2014, 10/24/2018, 08/01/2019   PFIZER(Purple Top)SARS-COV-2 Vaccination 07/04/2020, 07/25/2020   Pneumococcal Conjugate-13 09/14/2005   Pneumococcal Polysaccharide-23 08/19/2014, 12/22/2021   Tdap 09/22/2013    TDAP status: Up to date  Flu Vaccine status: Declined, Education has been provided regarding the importance of this vaccine but patient still declined. Advised may receive this vaccine at local pharmacy or Health Dept. Aware to provide a copy of the vaccination record if obtained from local pharmacy or Health Dept. Verbalized acceptance and understanding.  Pneumococcal  vaccine status: Up to date  Covid-19 vaccine status: Information provided on how to obtain vaccines.   Qualifies for Shingles Vaccine? Yes   Zostavax completed No   Shingrix Completed?: No.    Education has been provided regarding the importance of this vaccine. Patient has been advised to call insurance company to determine out of pocket expense if they have not yet received this vaccine. Advised may also receive vaccine at local pharmacy or Health Dept. Verbalized acceptance and understanding.  Screening Tests Health Maintenance  Topic Date Due   Zoster Vaccines- Shingrix (1 of 2) Never done   MAMMOGRAM  01/18/2014   COLONOSCOPY (Pts 45-28yrs Insurance coverage will need to be confirmed)  03/20/2022   COVID-19 Vaccine (3 - 2023-24 season) 06/17/2022   INFLUENZA VACCINE  05/18/2023   DTaP/Tdap/Td (3 - Td or Tdap) 09/23/2023   Medicare Annual Wellness (AWV)  02/16/2024   Pneumonia Vaccine 43+ Years old  Completed   DEXA SCAN  Completed   Hepatitis C Screening  Completed   HPV VACCINES  Aged Out    Health Maintenance  Health Maintenance Due  Topic Date Due    Zoster Vaccines- Shingrix (1 of 2) Never done   MAMMOGRAM  01/18/2014   COLONOSCOPY (Pts 45-54yrs Insurance coverage will need to be confirmed)  03/20/2022   COVID-19 Vaccine (3 - 2023-24 season) 06/17/2022    Colorectal cancer screening: Type of screening: Colonoscopy. Completed 03/20/2012. Repeat every 10 years-patient declined.  Mammogram status: Completed 01/19/2012. Repeat every year-patient declined.  Bone Density status: Completed 08/24/2018. Results reflect: Bone density results: OSTEOPENIA. Repeat every 3 years.  Lung Cancer Screening: (Low Dose CT Chest recommended if Age 67-80 years, 30 pack-year currently smoking OR have quit w/in 15years.) does not qualify.   Lung Cancer Screening Referral: No  Additional Screening:  Hepatitis C Screening: does qualify; Completed: 09/23/2019  Vision Screening: Recommended annual ophthalmology exams for early detection of glaucoma and other disorders of the eye. Is the patient up to date with their annual eye exam?  Yes  Who is the provider or what is the name of the office in which the patient attends annual eye exams? [facility] If pt is not established with a provider, would they like to be referred to a provider to establish care? No .   Dental Screening: Recommended annual dental exams for proper oral hygiene  Community Resource Referral / Chronic Care Management: CRR required this visit?  No   CCM required this visit?  No      Plan:     I have personally reviewed and noted the following in the patient's chart:   Medical and social history Use of alcohol, tobacco or illicit drugs  Current medications and supplements including opioid prescriptions. Patient is not currently taking opioid prescriptions. Functional ability and status Nutritional status Physical activity Advanced directives List of other physicians Hospitalizations, surgeries, and ER visits in previous 12 months Vitals Screenings to include cognitive,  depression, and falls Referrals and appointments  In addition, I have reviewed and discussed with patient certain preventive protocols, quality metrics, and best practice recommendations. A written personalized care plan for preventive services as well as general preventive health recommendations were provided to patient.     [name], MD   02/17/2023   Nurse Notes:  Normal cognitive status assessed by direct observation via telephone conversation by this Nurse Health Advisor. No abnormalities found.

## 2023-02-20 NOTE — Progress Notes (Signed)
Please cosign note.  Vung Kush N. Kindsey Eblin, LPN. [facility] AWV Team Direct Dial: [phone]

## 2023-03-06 ENCOUNTER — Telehealth: Payer: Medicare Other | Admitting: Nurse Practitioner

## 2023-03-06 DIAGNOSIS — J441 Chronic obstructive pulmonary disease with (acute) exacerbation: Secondary | ICD-10-CM

## 2023-03-06 NOTE — Progress Notes (Signed)
Because of your medical history and current symptoms, I feel your condition warrants further evaluation and I recommend that you be seen in a face to face visit.   NOTE: There will be NO CHARGE for this eVisit   If you are having a true medical emergency please call 911.      For an urgent face to face visit, [place] has eight urgent care centers for your convenience:   NEW!! [facility] Center at [facility] Get Driving Directions [phone] [address], [address] [place], [address]    [facility] Health Urgent Care Center at [facility] Get Driving Directions [phone] [address] [place], [place] [address]   [facility] Health Urgent Care Center [facility]) Get Driving Directions [phone] [address] [place], [place] [address]  [facility] Health Urgent Care Center [facility] [place]) Get Driving Directions [phone] [address] [place],  [place]  [address]  [facility] Health Urgent Care Center [facility] - at [employer]  [phone] [place],  [place] [address]   [facility] Health Urgent Care at [facility] Get Driving Directions [phone] [address] [place], [place] [address]   [facility] Health Urgent Care at [facility] Get Driving Directions  [phone] [address] [place], [place] [address]   [facility] Health Urgent Care at [facility] Directions [phone] [address] [place], [place] [address]  Your MyChart E-visit questionnaire answers were reviewed by a board certified advanced clinical practitioner to complete your personal care plan based on your specific symptoms.  Thank you for using e-Visits.

## 2023-04-04 ENCOUNTER — Encounter (HOSPITAL_COMMUNITY): Payer: Self-pay

## 2023-04-04 ENCOUNTER — Emergency Department (HOSPITAL_COMMUNITY): Payer: Medicare Other

## 2023-04-04 ENCOUNTER — Other Ambulatory Visit: Payer: Self-pay

## 2023-04-04 ENCOUNTER — Inpatient Hospital Stay (HOSPITAL_COMMUNITY)
Admission: EM | Admit: 2023-04-04 | Discharge: 2023-04-10 | DRG: 190 | Disposition: A | Discharge status: Home / Self Care | Payer: Medicare Other | Attending: Family Medicine | Admitting: Family Medicine

## 2023-04-04 DIAGNOSIS — Z9049 Acquired absence of other specified parts of digestive tract: Secondary | ICD-10-CM | POA: Diagnosis not present

## 2023-04-04 DIAGNOSIS — Z885 Allergy status to narcotic agent status: Secondary | ICD-10-CM | POA: Diagnosis not present

## 2023-04-04 DIAGNOSIS — Z90710 Acquired absence of both cervix and uterus: Secondary | ICD-10-CM

## 2023-04-04 DIAGNOSIS — E559 Vitamin D deficiency, unspecified: Secondary | ICD-10-CM | POA: Diagnosis present

## 2023-04-04 DIAGNOSIS — S22000A Wedge compression fracture of unspecified thoracic vertebra, initial encounter for closed fracture: Secondary | ICD-10-CM | POA: Diagnosis not present

## 2023-04-04 DIAGNOSIS — I2510 Atherosclerotic heart disease of native coronary artery without angina pectoris: Secondary | ICD-10-CM | POA: Diagnosis present

## 2023-04-04 DIAGNOSIS — Z87891 Personal history of nicotine dependence: Secondary | ICD-10-CM | POA: Diagnosis not present

## 2023-04-04 DIAGNOSIS — Z79899 Other long term (current) drug therapy: Secondary | ICD-10-CM | POA: Diagnosis not present

## 2023-04-04 DIAGNOSIS — M8008XA Age-related osteoporosis with current pathological fracture, vertebra(e), initial encounter for fracture: Secondary | ICD-10-CM | POA: Diagnosis not present

## 2023-04-04 DIAGNOSIS — J9601 Acute respiratory failure with hypoxia: Secondary | ICD-10-CM | POA: Diagnosis present

## 2023-04-04 DIAGNOSIS — Z803 Family history of malignant neoplasm of breast: Secondary | ICD-10-CM | POA: Diagnosis not present

## 2023-04-04 DIAGNOSIS — R0602 Shortness of breath: Secondary | ICD-10-CM | POA: Diagnosis not present

## 2023-04-04 DIAGNOSIS — J45901 Unspecified asthma with (acute) exacerbation: Principal | ICD-10-CM

## 2023-04-04 DIAGNOSIS — Z8261 Family history of arthritis: Secondary | ICD-10-CM | POA: Diagnosis not present

## 2023-04-04 DIAGNOSIS — Z823 Family history of stroke: Secondary | ICD-10-CM

## 2023-04-04 DIAGNOSIS — M4854XA Collapsed vertebra, not elsewhere classified, thoracic region, initial encounter for fracture: Secondary | ICD-10-CM | POA: Diagnosis not present

## 2023-04-04 DIAGNOSIS — J441 Chronic obstructive pulmonary disease with (acute) exacerbation: Principal | ICD-10-CM | POA: Diagnosis present

## 2023-04-04 DIAGNOSIS — Z8719 Personal history of other diseases of the digestive system: Secondary | ICD-10-CM | POA: Diagnosis not present

## 2023-04-04 DIAGNOSIS — M40204 Unspecified kyphosis, thoracic region: Secondary | ICD-10-CM | POA: Diagnosis not present

## 2023-04-04 DIAGNOSIS — Z8249 Family history of ischemic heart disease and other diseases of the circulatory system: Secondary | ICD-10-CM

## 2023-04-04 DIAGNOSIS — Z825 Family history of asthma and other chronic lower respiratory diseases: Secondary | ICD-10-CM | POA: Diagnosis not present

## 2023-04-04 DIAGNOSIS — F411 Generalized anxiety disorder: Secondary | ICD-10-CM | POA: Diagnosis not present

## 2023-04-04 DIAGNOSIS — R000 Tachycardia, unspecified: Secondary | ICD-10-CM | POA: Diagnosis not present

## 2023-04-04 DIAGNOSIS — Z881 Allergy status to other antibiotic agents status: Secondary | ICD-10-CM

## 2023-04-04 DIAGNOSIS — Z808 Family history of malignant neoplasm of other organs or systems: Secondary | ICD-10-CM | POA: Diagnosis not present

## 2023-04-04 LAB — CBC
HCT: 47.6 % — ABNORMAL HIGH (ref 36.0–46.0)
HCT: 45 % (ref 36.0–46.0)
Hemoglobin: 15.2 g/dL — ABNORMAL HIGH (ref 12.0–15.0)
Hemoglobin: 14.6 g/dL (ref 12.0–15.0)
MCH: 30.4 pg (ref 26.0–34.0)
MCH: 31.3 pg (ref 26.0–34.0)
MCHC: 31.9 g/dL (ref 30.0–36.0)
MCHC: 32.4 g/dL (ref 30.0–36.0)
MCV: 95.2 fL (ref 80.0–100.0)
MCV: 96.6 fL (ref 80.0–100.0)
Platelets: 346 10*3/uL (ref 150–400)
Platelets: 303 10*3/uL (ref 150–400)
RBC: 5 MIL/uL (ref 3.87–5.11)
RBC: 4.66 MIL/uL (ref 3.87–5.11)
RDW: 13.9 % (ref 11.5–15.5)
RDW: 13.8 % (ref 11.5–15.5)
WBC: 9.6 10*3/uL (ref 4.0–10.5)
WBC: 9 10*3/uL (ref 4.0–10.5)
nRBC: 0 % (ref 0.0–0.2)
nRBC: 0 % (ref 0.0–0.2)

## 2023-04-04 LAB — BASIC METABOLIC PANEL
Anion gap: 8 (ref 5–15)
BUN: 10 mg/dL (ref 8–23)
CO2: 30 mmol/L (ref 22–32)
Calcium: 9.2 mg/dL (ref 8.9–10.3)
Chloride: 100 mmol/L (ref 98–111)
Creatinine, Ser: 0.6 mg/dL (ref 0.44–1.00)
GFR, Estimated: >60 mL/min (ref >60)
Glucose, Bld: 104 mg/dL — ABNORMAL HIGH (ref 70–99)
Potassium: 4.3 mmol/L (ref 3.5–5.1)
Sodium: 138 mmol/L (ref 135–145)

## 2023-04-04 LAB — BLOOD GAS, VENOUS
Acid-Base Excess: 5.6 mmol/L — ABNORMAL HIGH (ref 0.0–2.0)
Bicarbonate: 32.8 mmol/L — ABNORMAL HIGH (ref 20.0–28.0)
O2 Saturation: 83 %
Patient temperature: 37
pCO2, Ven: 58 mmHg (ref 44–60)
pH, Ven: 7.36 (ref 7.25–7.43)
pO2, Ven: 48 mmHg — ABNORMAL HIGH (ref 32–45)

## 2023-04-04 LAB — TROPONIN I (HIGH SENSITIVITY)
Troponin I (High Sensitivity): 5 ng/L (ref <18)
Troponin I (High Sensitivity): 5 ng/L (ref <18)

## 2023-04-04 LAB — BRAIN NATRIURETIC PEPTIDE: B Natriuretic Peptide: 65 pg/mL (ref 0.0–100.0)

## 2023-04-04 LAB — CREATININE, SERUM
Creatinine, Ser: 0.66 mg/dL (ref 0.44–1.00)
GFR, Estimated: >60 mL/min (ref >60)

## 2023-04-04 MED ORDER — IPRATROPIUM-ALBUTEROL 0.5-2.5 (3) MG/3ML IN SOLN
3.0000 mL | Freq: Once | RESPIRATORY_TRACT | Status: AC
  Administered 2023-04-04: 3 mL via RESPIRATORY_TRACT
  Filled 2023-04-04: qty 3

## 2023-04-04 MED ORDER — PREDNISONE 50 MG PO TABS
50.0000 mg | ORAL_TABLET | Freq: Every day | ORAL | Status: DC
  Administered 2023-04-05 – 2023-04-06 (×2): 50 mg via ORAL
  Filled 2023-04-04 (×2): qty 1

## 2023-04-04 MED ORDER — ALBUTEROL SULFATE (2.5 MG/3ML) 0.083% IN NEBU
10.0000 mg/h | INHALATION_SOLUTION | Freq: Once | RESPIRATORY_TRACT | Status: AC
  Administered 2023-04-04: 10 mg/h via RESPIRATORY_TRACT
  Filled 2023-04-04: qty 15

## 2023-04-04 MED ORDER — IPRATROPIUM-ALBUTEROL 0.5-2.5 (3) MG/3ML IN SOLN
3.0000 mL | Freq: Four times a day (QID) | RESPIRATORY_TRACT | Status: DC
  Administered 2023-04-04: 3 mL via RESPIRATORY_TRACT
  Filled 2023-04-04: qty 3

## 2023-04-04 MED ORDER — ENOXAPARIN SODIUM 40 MG/0.4ML IJ SOSY
40.0000 mg | PREFILLED_SYRINGE | INTRAMUSCULAR | Status: DC
  Administered 2023-04-04 – 2023-04-09 (×6): 40 mg via SUBCUTANEOUS
  Filled 2023-04-04 (×6): qty 0.4

## 2023-04-04 MED ORDER — ONDANSETRON HCL 4 MG PO TABS
4.0000 mg | ORAL_TABLET | Freq: Four times a day (QID) | ORAL | Status: DC | PRN
  Administered 2023-04-06: 4 mg via ORAL
  Filled 2023-04-04: qty 1

## 2023-04-04 MED ORDER — METHYLPREDNISOLONE SODIUM SUCC 125 MG IJ SOLR
125.0000 mg | Freq: Once | INTRAMUSCULAR | Status: AC
  Administered 2023-04-04: 125 mg via INTRAVENOUS
  Filled 2023-04-04: qty 2

## 2023-04-04 MED ORDER — ARFORMOTEROL TARTRATE 15 MCG/2ML IN NEBU
15.0000 ug | INHALATION_SOLUTION | Freq: Two times a day (BID) | RESPIRATORY_TRACT | Status: DC
  Administered 2023-04-04 – 2023-04-10 (×12): 15 ug via RESPIRATORY_TRACT
  Filled 2023-04-04 (×13): qty 2

## 2023-04-04 MED ORDER — ONDANSETRON HCL 4 MG/2ML IJ SOLN: 4.0000 mg | Freq: Four times a day (QID) | INTRAMUSCULAR | Status: DC | PRN

## 2023-04-04 MED ORDER — BUDESONIDE 0.25 MG/2ML IN SUSP
0.2500 mg | Freq: Two times a day (BID) | RESPIRATORY_TRACT | Status: DC
  Administered 2023-04-04 – 2023-04-10 (×12): 0.25 mg via RESPIRATORY_TRACT
  Filled 2023-04-04 (×12): qty 2

## 2023-04-04 MED ORDER — OXYCODONE HCL 5 MG PO TABS
5.0000 mg | ORAL_TABLET | ORAL | Status: DC | PRN
  Administered 2023-04-07: 5 mg via ORAL
  Filled 2023-04-04 (×3): qty 1

## 2023-04-04 MED ORDER — ACETAMINOPHEN 650 MG RE SUPP: 650.0000 mg | Freq: Four times a day (QID) | RECTAL | Status: DC | PRN

## 2023-04-04 MED ORDER — ALPRAZOLAM 0.25 MG PO TABS
0.2500 mg | ORAL_TABLET | Freq: Two times a day (BID) | ORAL | Status: DC | PRN
  Administered 2023-04-05 – 2023-04-10 (×9): 0.25 mg via ORAL
  Filled 2023-04-04 (×9): qty 1

## 2023-04-04 MED ORDER — DOXYCYCLINE HYCLATE 100 MG PO TABS
100.0000 mg | ORAL_TABLET | Freq: Two times a day (BID) | ORAL | Status: DC
  Administered 2023-04-04 – 2023-04-10 (×12): 100 mg via ORAL
  Filled 2023-04-04 (×12): qty 1

## 2023-04-04 MED ORDER — ACETAMINOPHEN 325 MG PO TABS
650.0000 mg | ORAL_TABLET | Freq: Four times a day (QID) | ORAL | Status: DC | PRN
  Administered 2023-04-08: 650 mg via ORAL
  Filled 2023-04-04 (×2): qty 2

## 2023-04-04 MED ORDER — IPRATROPIUM-ALBUTEROL 0.5-2.5 (3) MG/3ML IN SOLN
3.0000 mL | Freq: Three times a day (TID) | RESPIRATORY_TRACT | Status: DC
  Administered 2023-04-05: 3 mL via RESPIRATORY_TRACT
  Filled 2023-04-04: qty 3

## 2023-04-04 NOTE — ED Notes (Signed)
ED TO INPATIENT HANDOFF REPORT  ED Nurse Name and Phone #:  Rayvon Char Name/Age/Gender Lynn Kline 67 y.o. female Room/Bed: WA02/WA02  Code Status   Code Status: Full Code  Home/SNF/Other Home Patient oriented to: self, place, time, and situation Is this baseline? Yes   Triage Complete: Triage complete  Chief Complaint COPD with acute exacerbation (HCC) [J44.1]  Triage Note Pt coming in complaining of feeling SOB. PT has hx of COPD, used two breathing treatments this morning but states she feels like she can not get it to clear up. Pt does endorse back pain, unproductive cough, denies any chest pain.     Allergies Allergies  Allergen Reactions   Codeine Nausea And Vomiting   Erythromycin Nausea And Vomiting   Ampicillin Nausea And Vomiting, Rash and Other (See Comments)    Pt states that rash was on her tongue.  Has patient had a PCN reaction causing immediate rash, facial/tongue/throat swelling, SOB or lightheadedness with hypotension yes Has patient had a PCN reaction causing severe rash involving mucus membranes or skin necrosis: no Has patient had a PCN reaction that required hospitalization yes Has patient had a PCN reaction occurring within the last 10 years: no If all of the above answers are "NO", then may proceed with Cephalosporin use.     Level of Care/Admitting Diagnosis ED Disposition     ED Disposition  Admit   Condition  --   Comment  Hospital Area: [facility] [[id number]]  Level of Care: Telemetry [5]  Admit to tele based on following criteria: Other see comments  Comments: chf  May admit patient to [facility] or [facility] if equivalent level of care is available:: Yes  Covid Evaluation: Asymptomatic - no recent exposure (last 10 days) testing not required  Diagnosis: COPD with acute exacerbation [facility]) [[id number]]  Admitting Physician: Alan Mulder [[id number]]  Attending Physician: Alan Mulder [phone]   Certification:: I certify this patient will need inpatient services for at least 2 midnights  Estimated Length of Stay: 3          B Medical/Surgery History Past Medical History:  Diagnosis Date   Anxiety    Atherosclerosis    Colon polyp, hyperplastic    COPD (chronic obstructive pulmonary disease) (HCC)    Diverticulosis    Gallstone    Pneumonia    Past Surgical History:  Procedure Laterality Date   ABDOMINAL HYSTERECTOMY     BRONCHIAL BIOPSY  11/03/2020   Procedure: BRONCHIAL BIOPSIES;  Surgeon: Josephine Igo, DO;  Location: [facility];  Service: Pulmonary;;   BRONCHIAL BRUSHINGS  11/03/2020   Procedure: BRONCHIAL BRUSHINGS;  Surgeon: Josephine Igo, DO;  Location: [facility];  Service: Pulmonary;;   BRONCHIAL NEEDLE ASPIRATION BIOPSY  11/03/2020   Procedure: BRONCHIAL NEEDLE ASPIRATION BIOPSIES;  Surgeon: Josephine Igo, DO;  Location: [facility];  Service: Pulmonary;;   BRONCHIAL WASHINGS  11/03/2020   Procedure: BRONCHIAL WASHINGS;  Surgeon: Josephine Igo, DO;  [facility]: [facility];  Service: Pulmonary;;   CESAREAN SECTION     CHOLECYSTECTOMY     COLONOSCOPY     FIDUCIAL MARKER PLACEMENT  11/03/2020   Procedure: FIDUCIAL MARKER PLACEMENT;  Surgeon: Josephine Igo, DO;  Location: [facility];  Service: Pulmonary;;   VIDEO BRONCHOSCOPY WITH ENDOBRONCHIAL NAVIGATION N/A 11/03/2020   Procedure: VIDEO BRONCHOSCOPY WITH ENDOBRONCHIAL NAVIGATION AND POSSIBLE ULTRASOUND;  Surgeon: Josephine Igo, DO;  Location: [facility];  Service: Pulmonary;  Laterality: N/A;  VIDEO BRONCHOSCOPY WITH ENDOBRONCHIAL ULTRASOUND  11/03/2020   Procedure: VIDEO BRONCHOSCOPY WITH ENDOBRONCHIAL ULTRASOUND;  Surgeon: Josephine Igo, DO;  Location: [facility];  Service: Pulmonary;;     A IV Location/Drains/Wounds Patient Lines/Drains/Airways Status     Active Line/Drains/Airways     Name Placement date Placement time Site Days   Peripheral IV 04/04/23 20 G 1" Left  Antecubital 04/04/23  1752  Antecubital  less than 1            Intake/Output Last 24 hours No intake or output data in the 24 hours ending 04/04/23 2143  Labs/Imaging Results for orders placed or performed during the hospital encounter of 04/04/23 (from the past 48 hour(s))  Basic metabolic panel     Status: Abnormal   Collection Time: 04/04/23  3:58 PM  Result Value Ref Range   Sodium 138 135 - 145 mmol/L   Potassium 4.3 3.5 - 5.1 mmol/L   Chloride 100 98 - 111 mmol/L   CO2 30 22 - 32 mmol/L   Glucose, Bld 104 (H) 70 - 99 mg/dL    Comment: Glucose reference range applies only to samples taken after fasting for at least 8 hours.   BUN 10 8 - 23 mg/dL   Creatinine, Ser 1.61 0.44 - 1.00 mg/dL   Calcium 9.2 8.9 - 09.6 mg/dL   GFR, Estimated >04 >54 mL/min    Comment: (NOTE) Calculated using the CKD-EPI Creatinine Equation (2021)    Anion gap 8 5 - 15    Comment: Performed at [facility], [address], [place], [place] [address]  CBC     Status: Abnormal   Collection Time: 04/04/23  3:58 PM  Result Value Ref Range   WBC 9.6 4.0 - 10.5 K/uL   RBC 5.00 3.87 - 5.11 MIL/uL   Hemoglobin 15.2 (H) 12.0 - 15.0 g/dL   HCT 91.4 (H) 78.2 - 95.6 %   MCV 95.2 80.0 - 100.0 fL   MCH 30.4 26.0 - 34.0 pg   MCHC 31.9 30.0 - 36.0 g/dL   RDW 21.3 08.6 - 57.8 %   Platelets 346 150 - 400 K/uL   nRBC 0.0 0.0 - 0.2 %    Comment: Performed at [facility], [address] [place], [place] [address]  Troponin I (High Sensitivity)     Status: None   Collection Time: 04/04/23  3:58 PM  Result Value Ref Range   Troponin I (High Sensitivity) 5 <18 ng/L    Comment: (NOTE) Elevated high sensitivity troponin I (hsTnI) values and significant  changes across serial measurements may suggest ACS but many other  chronic and acute conditions are known to elevate hsTnI results.  Refer to the "Links" section for chest pain algorithms and additional   guidance. Performed at [facility], [address] [place], [place] [address]   Brain natriuretic peptide     Status: None   Collection Time: 04/04/23  3:58 PM  Result Value Ref Range   B Natriuretic Peptide 65.0 0.0 - 100.0 pg/mL    Comment: Performed at [facility], [address] [place], [place] [address]  Blood gas, venous     Status: Abnormal   Collection Time: 04/04/23  4:35 PM  Result Value Ref Range   pH, Ven 7.36 7.25 - 7.43   pCO2, Ven 58 44 - 60 mmHg   pO2, Ven 48 (H) 32 - 45 mmHg   Bicarbonate 32.8 (H) 20.0 -  28.0 mmol/L   Acid-Base Excess 5.6 (H) 0.0 - 2.0 mmol/L   O2 Saturation 83 %   Patient temperature 37.0     Comment: Performed at [facility], [address], [place], [place] [address]  Troponin I (High Sensitivity)     Status: None   Collection Time: 04/04/23  6:58 PM  Result Value Ref Range   Troponin I (High Sensitivity) 5 <18 ng/L    Comment: (NOTE) Elevated high sensitivity troponin I (hsTnI) values and significant  changes across serial measurements may suggest ACS but many other  chronic and acute conditions are known to elevate hsTnI results.  Refer to the "Links" section for chest pain algorithms and additional  guidance. Performed at [facility], [address], [place], [place] [address]    DG Chest 2 View  Result Date: 04/04/2023 CLINICAL DATA:  Shortness of breath EXAM: CHEST - 2 VIEW COMPARISON:  X-ray 12/05/2022 and older FINDINGS: Hyperinflation. Left apical pleural thickening and scarring. No consolidation or edema. Normal cardiopericardial silhouette. There is blunting of the inferior costophrenic angles. Pleural thickening is possible rather than effusion. Surgical clips overlie the left upper chest. There is kyphosis with compression of a midthoracic spine vertebral level which is progressive from the prior x-ray. Please correlate with clinical history.  IMPRESSION: Hyperinflation with chronic changes. Increasing, now severe compression of the midthoracic spine level compared to previous. Now developing kyphosis. Please correlate for any known history and if needed additional spinal workup as clinically appropriate to exclude a marrow replacement process or neoplasm. Electronically Signed   By: Karen Kays M.D.   On: 04/04/2023 17:28    Pending Labs Unresulted Labs (From admission, onward)     Start     Ordered   04/11/23 0500  Creatinine, serum  (enoxaparin (LOVENOX)    CrCl >/= 30 ml/min)  Weekly,   R     Comments: while on enoxaparin therapy    04/04/23 2052   04/05/23 0500  Basic metabolic panel  Tomorrow morning,   R        04/04/23 2052   04/05/23 0500  CBC  Tomorrow morning,   R        04/04/23 2052   04/04/23 2049  CBC  (enoxaparin (LOVENOX)    CrCl >/= 30 ml/min)  Once,   R       Comments: Baseline for enoxaparin therapy IF NOT ALREADY DRAWN.  Notify MD if PLT < 100 K.    04/04/23 2052   04/04/23 2049  Creatinine, serum  (enoxaparin (LOVENOX)    CrCl >/= 30 ml/min)  Once,   R       Comments: Baseline for enoxaparin therapy IF NOT ALREADY DRAWN.    04/04/23 2052   04/04/23 2048  HIV Antibody (routine testing w rflx)  (HIV Antibody (Routine testing w reflex) panel)  Once,   R        04/04/23 2052            Vitals/Pain Today's Vitals   04/04/23 2030 04/04/23 2045 04/04/23 2100 04/04/23 2118  BP: (!) 162/85 (!) 151/99 (!) 157/81   Pulse: (!) 106 (!) 101 90   Resp:      Temp:      TempSrc:      SpO2: 97% 95% (!) 89% 96%  Weight:      Height:      PainSc:        Isolation Precautions No active isolations  Medications Medications  ALPRAZolam (XANAX) tablet 0.25 mg (has no administration in time range)  enoxaparin (LOVENOX) injection 40 mg (has no administration in time range)  acetaminophen (TYLENOL) tablet 650 mg (has no administration in time range)    Or  acetaminophen (TYLENOL) suppository 650 mg (has no  administration in time range)  oxyCODONE (Oxy IR/ROXICODONE) immediate release tablet 5 mg (has no administration in time range)  ondansetron (ZOFRAN) tablet 4 mg (has no administration in time range)    Or  ondansetron (ZOFRAN) injection 4 mg (has no administration in time range)  budesonide (PULMICORT) nebulizer solution 0.25 mg (0.25 mg Nebulization Given 04/04/23 2118)  arformoterol (BROVANA) nebulizer solution 15 mcg (15 mcg Nebulization Given 04/04/23 2118)  predniSONE (DELTASONE) tablet 50 mg (has no administration in time range)  doxycycline (VIBRA-TABS) tablet 100 mg (has no administration in time range)  ipratropium-albuterol (DUONEB) 0.5-2.5 (3) MG/3ML nebulizer solution 3 mL (has no administration in time range)  ipratropium-albuterol (DUONEB) 0.5-2.5 (3) MG/3ML nebulizer solution 3 mL (3 mLs Nebulization Given 04/04/23 1654)  albuterol (PROVENTIL) (2.5 MG/3ML) 0.083% nebulizer solution (10 mg/hr Nebulization Given 04/04/23 1855)  methylPREDNISolone sodium succinate (SOLU-MEDROL) 125 mg/2 mL injection 125 mg (125 mg Intravenous Given 04/04/23 1852)    Mobility walks     Focused Assessments     R Recommendations: See Admitting Provider Note  Report given to:   Additional Notes:

## 2023-04-04 NOTE — H&P (Signed)
History and Physical    Lynn Kline:[id number] DOB: 1956/08/22 DOA: 04/04/2023  PCP: Etta Grandchild, MD   Chief Complaint: sob  HPI: Lynn Kline is a 67 y.o. female with medical history significant of COPD, CAD, diverticulosis who presented to the emergency department due to shortness of breath.  Patient states over the last week she has had a productive cough and progressively worsening wheezing and weakness.  She has a 40 pack year smoking history but says she quit for the last year.  She presented to emergency department where she was found to be afebrile and hemodynamically stable.  She was placed on supplemental oxygen due to intermittent desaturations.  She had persistent wheezing.  Labs were obtained which showed sodium 138, glucose 104, WBC 9.6, hemoglobin 15.2, troponin within normal limits, BNP 65, chest x-ray showed increasing compression of spinal level with hyperinflation and chronic changes.   Review of Systems: Review of Systems  All other systems reviewed and are negative.    As per HPI otherwise 10 point review of systems negative.   Allergies  Allergen Reactions   Codeine Nausea And Vomiting   Erythromycin Nausea And Vomiting   Ampicillin Nausea And Vomiting, Rash and Other (See Comments)    Pt states that rash was on her tongue.  Has patient had a PCN reaction causing immediate rash, facial/tongue/throat swelling, SOB or lightheadedness with hypotension yes Has patient had a PCN reaction causing severe rash involving mucus membranes or skin necrosis: no Has patient had a PCN reaction that required hospitalization yes Has patient had a PCN reaction occurring within the last 10 years: no If all of the above answers are "NO", then may proceed with Cephalosporin use.     Past Medical History:  Diagnosis Date   Anxiety    Atherosclerosis    Colon polyp, hyperplastic    COPD (chronic obstructive pulmonary disease) (HCC)    Diverticulosis    Gallstone     Pneumonia     Past Surgical History:  Procedure Laterality Date   ABDOMINAL HYSTERECTOMY     BRONCHIAL BIOPSY  11/03/2020   Procedure: BRONCHIAL BIOPSIES;  Surgeon: Josephine Igo, DO;  [facility]: [facility];  Service: Pulmonary;;   BRONCHIAL BRUSHINGS  11/03/2020   Procedure: BRONCHIAL BRUSHINGS;  Surgeon: Josephine Igo, DO;  Location: [facility];  Service: Pulmonary;;   BRONCHIAL NEEDLE ASPIRATION BIOPSY  11/03/2020   Procedure: BRONCHIAL NEEDLE ASPIRATION BIOPSIES;  Surgeon: Josephine Igo, DO;  [facility]: [facility];  Service: Pulmonary;;   BRONCHIAL WASHINGS  11/03/2020   Procedure: BRONCHIAL WASHINGS;  Surgeon: Josephine Igo, DO;  [facility]: [facility];  Service: Pulmonary;;   CESAREAN SECTION     CHOLECYSTECTOMY     COLONOSCOPY     FIDUCIAL MARKER PLACEMENT  11/03/2020   Procedure: FIDUCIAL MARKER PLACEMENT;  Surgeon: Josephine Igo, DO;  [facility]: [facility];  Service: Pulmonary;;   VIDEO BRONCHOSCOPY WITH ENDOBRONCHIAL NAVIGATION N/A 11/03/2020   Procedure: VIDEO BRONCHOSCOPY WITH ENDOBRONCHIAL NAVIGATION AND POSSIBLE ULTRASOUND;  Surgeon: Josephine Igo, DO;  Location: [facility];  Service: Pulmonary;  Laterality: N/A;   VIDEO BRONCHOSCOPY WITH ENDOBRONCHIAL ULTRASOUND  11/03/2020   Procedure: VIDEO BRONCHOSCOPY WITH ENDOBRONCHIAL ULTRASOUND;  Surgeon: Josephine Igo, DO;  Location: [facility];  Service: Pulmonary;;     reports that she quit smoking about 15 months ago. Her smoking use included cigarettes. She has never used smokeless tobacco. She reports that she does not drink alcohol and does not  use drugs.  Family History  Problem Relation Age of Onset   Arthritis Mother    COPD Mother    Emphysema Mother    Stroke Father    Heart disease Father    Cancer Father        MELANOMA   Breast cancer Maternal Grandmother    Colon cancer Neg Hx     Prior to Admission medications   Medication Sig Start Date End Date Taking? Authorizing Provider   albuterol (PROVENTIL) (2.5 MG/3ML) 0.083% nebulizer solution USE 3 ML VIA NEBULIZER EVERY 4 HOURS AS NEEDED FOR WHEEZING OR SHORTNESS OF BREATH Patient taking differently: Take 2.5 mg by nebulization every 4 (four) hours as needed for shortness of breath or wheezing. 02/06/23  Yes Etta Grandchild, MD  ALPRAZolam Prudy Feeler) 0.25 MG tablet 1 tab yb mouth twice per day as needed 10/25/22  Yes Etta Grandchild, MD  Budeson-Glycopyrrol-Formoterol (BREZTRI AEROSPHERE) 160-9-4.8 MCG/ACT AERO Inhale 2 puffs into the lungs in the morning and at bedtime. 07/01/22  Yes Icard, Bradley L, DO  VENTOLIN HFA 108 (90 Base) MCG/ACT inhaler INHALE 1 TO 2 PUFFS INTO THE LUNGS EVERY 6 HOURS AS NEEDED FOR WHEEZING OR SHORTNESS OF BREATH Patient taking differently: Inhale 1-2 puffs into the lungs every 6 (six) hours as needed for wheezing. 02/06/23  Yes Etta Grandchild, MD  cyanocobalamin 2000 MCG tablet Take 1 tablet (2,000 mcg total) by mouth daily. Patient not taking: Reported on 04/04/2023 12/14/22   Etta Grandchild, MD    Physical Exam: Vitals:   04/04/23 2200 04/04/23 2215 04/04/23 2243 04/04/23 2244  BP: 135/80 136/78  (!) 143/79  Pulse: (!) 107 96  93  Resp:  18  (!) 24  Temp:  98.1 F (36.7 C)  98.6 F (37 C)  TempSrc:  Oral  Oral  SpO2: 91% 94%  92%  Weight:   61.4 kg   Height:   5\' 2"  (1.575 m)    Physical Exam Vitals reviewed.  Constitutional:      Appearance: She is normal weight.  HENT:     Head: Normocephalic.     Mouth/Throat:     Mouth: Mucous membranes are moist.  Cardiovascular:     Rate and Rhythm: Normal rate and regular rhythm.  Pulmonary:     Effort: Pulmonary effort is normal.     Breath sounds: Normal breath sounds.  Abdominal:     Palpations: Abdomen is soft.  Musculoskeletal:        General: Normal range of motion.     Cervical back: Normal range of motion.  Skin:    General: Skin is warm.  Neurological:     General: No focal deficit present.     Mental Status: She is alert  and oriented to person, place, and time.  Psychiatric:        Mood and Affect: Mood normal.        Labs on Admission: I have personally reviewed the patients's labs and imaging studies.  Assessment/Plan Principal Problem:   COPD with acute exacerbation (HCC) # Acute hypoxic respiratory failure secondary to COPD exacerbation, POA, active - Patient was hypoxic in the 80s - Patient is not on oxygen at home - 40+ pack year smoking history - Patient endorses compliance with home inhalers-productive cough without systemic infectious complaints  Plan: Prednisone for 5 days Schedule DuoNebs Pulmicort Doxycycline due to azithromycin allergy  # Generalized anxiety-continue home Xanax  # Concern for compression of thoracic spine-obtain MRI once  patient is clinically stable  Admission status: Inpatient Telemetry  Certification: The appropriate patient status for this patient is INPATIENT. Inpatient status is judged to be reasonable and necessary in order to provide the required intensity of service to ensure the patient's safety. The patient's presenting symptoms, physical exam findings, and initial radiographic and laboratory data in the context of their chronic comorbidities is felt to place them at high risk for further clinical deterioration. Furthermore, it is not anticipated that the patient will be medically stable for discharge from the hospital within 2 midnights of admission.   * I certify that at the point of admission it is my clinical judgment that the patient will require inpatient hospital care spanning beyond 2 midnights from the point of admission due to high intensity of service, high risk for further deterioration and high frequency of surveillance required.Alan Mulder MD [facility] If 7PM-7AM, please contact night-coverage [email or web address]  04/04/2023, 11:08 PM

## 2023-04-04 NOTE — ED Notes (Signed)
PT REFUSED EKG

## 2023-04-04 NOTE — ED Triage Notes (Signed)
Pt coming in complaining of feeling SOB. PT has hx of COPD, used two breathing treatments this morning but states she feels like she can not get it to clear up. Pt does endorse back pain, unproductive cough, denies any chest pain.

## 2023-04-04 NOTE — ED Provider Notes (Signed)
[facility] AT [facility] Provider Note   CSN: [id number] Arrival date & time: 04/04/23  1535     History  Chief Complaint  Patient presents with   Shortness of Breath    Lynn Kline is a 67 y.o. female.  HPI    67 year old female comes in with chief complaint of shortness of breath.  Patient has history of COPD, coronary atherosclerosis but no history of MI, 45+ pack year tobacco use disorder.  She states that she has been feeling short of breath for the last week.  Her symptoms have progressed.  She has been using her maintenance inhaler as prescribed and has been using albuterol 6-10 times the last 2 or 3 days.  She gets transient relief only.  Patient has chest tightness and wheezing.  She feels that there is congestion in the throat.  No upper respiratory infection-like symptoms.  No specific triggers.  Patient denies any fevers, chills.  She has not required ED visit for COPD in several years.  Pt has no hx of PE, DVT and denies any exogenous hormone (testosterone / estrogen) use, long distance travels or surgery in the past 6 weeks, active cancer, recent immobilization.   Home Medications Prior to Admission medications   Medication Sig Start Date End Date Taking? Authorizing Provider  albuterol (PROVENTIL) (2.5 MG/3ML) 0.083% nebulizer solution USE 3 ML VIA NEBULIZER EVERY 4 HOURS AS NEEDED FOR WHEEZING OR SHORTNESS OF BREATH 02/06/23   Etta Grandchild, MD  ALPRAZolam Prudy Feeler) 0.25 MG tablet 1 tab yb mouth twice per day as needed 10/25/22   Etta Grandchild, MD  Budeson-Glycopyrrol-Formoterol (BREZTRI AEROSPHERE) 160-9-4.8 MCG/ACT AERO Inhale 2 puffs into the lungs in the morning and at bedtime. 07/01/22   Josephine Igo, DO  Calcium Carbonate-Vitamin D (CALCIUM PLUS VITAMIN D PO) Take 1 tablet by mouth daily. 1200mg  calcium and 1000 units vitamin D Patient not taking: Reported on 12/05/2022    [provider]  cyanocobalamin 2000 MCG  tablet Take 1 tablet (2,000 mcg total) by mouth daily. 12/14/22   Etta Grandchild, MD  VENTOLIN HFA 108 (90 Base) MCG/ACT inhaler INHALE 1 TO 2 PUFFS INTO THE LUNGS EVERY 6 HOURS AS NEEDED FOR WHEEZING OR SHORTNESS OF BREATH 02/06/23   Etta Grandchild, MD      Allergies    Codeine, Erythromycin, and Ampicillin    Review of Systems   Review of Systems  All other systems reviewed and are negative.   Physical Exam Updated Vital Signs BP (!) 160/91   Pulse 86   Temp 98.2 F (36.8 C) (Oral)   Resp (!) 21   Ht 5\' 2"  (1.575 m)   Wt 62.1 kg   SpO2 93%   BMI 25.02 kg/m  Physical Exam Vitals and nursing note reviewed.  Constitutional:      Appearance: She is well-developed.  HENT:     Head: Atraumatic.  Cardiovascular:     Rate and Rhythm: Normal rate.  Pulmonary:     Effort: Pulmonary effort is normal.     Breath sounds: Decreased breath sounds and wheezing present.  Musculoskeletal:     Cervical back: Normal range of motion and neck supple.     Right lower leg: No tenderness. No edema.     Left lower leg: No tenderness. No edema.  Skin:    General: Skin is warm and dry.  Neurological:     Mental Status: She is alert and oriented to  person, place, and time.     ED Results / Procedures / Treatments   Labs (all labs ordered are listed, but only abnormal results are displayed) Labs Reviewed  BASIC METABOLIC PANEL - Abnormal; Notable for the following components:      Result Value   Glucose, Bld 104 (*)    All other components within normal limits  CBC - Abnormal; Notable for the following components:   Hemoglobin 15.2 (*)    HCT 47.6 (*)    All other components within normal limits  BLOOD GAS, VENOUS - Abnormal; Notable for the following components:   pO2, Ven 48 (*)    Bicarbonate 32.8 (*)    Acid-Base Excess 5.6 (*)    All other components within normal limits  BRAIN NATRIURETIC PEPTIDE  TROPONIN I (HIGH SENSITIVITY)  TROPONIN I (HIGH SENSITIVITY)    EKG EKG  Interpretation  Date/Time:  Tuesday April 04 2023 20:24:07 EDT Ventricular Rate:  101 PR Interval:  118 QRS Duration: 95 QT Interval:  347 QTC Calculation: 450 R Axis:   79 Text Interpretation: Sinus tachycardia No acute changes No significant change since last tracing Confirmed by Derwood Kaplan ([id number]) on 04/04/2023 8:34:31 PM  Radiology DG Chest 2 View  Result Date: 04/04/2023 CLINICAL DATA:  Shortness of breath EXAM: CHEST - 2 VIEW COMPARISON:  X-ray 12/05/2022 and older FINDINGS: Hyperinflation. Left apical pleural thickening and scarring. No consolidation or edema. Normal cardiopericardial silhouette. There is blunting of the inferior costophrenic angles. Pleural thickening is possible rather than effusion. Surgical clips overlie the left upper chest. There is kyphosis with compression of a midthoracic spine vertebral level which is progressive from the prior x-ray. Please correlate with clinical history. IMPRESSION: Hyperinflation with chronic changes. Increasing, now severe compression of the midthoracic spine level compared to previous. Now developing kyphosis. Please correlate for any known history and if needed additional spinal workup as clinically appropriate to exclude a marrow replacement process or neoplasm. Electronically Signed   By: Karen Kays M.D.   On: 04/04/2023 17:28    Procedures .Critical Care  Performed by: Derwood Kaplan, MD Authorized by: Derwood Kaplan, MD   Critical care provider statement:    Critical care time (minutes):  37   Critical care was necessary to treat or prevent imminent or life-threatening deterioration of the following conditions:  Respiratory failure   Critical care was time spent personally by me on the following activities:  Development of treatment plan with patient or surrogate, discussions with consultants, evaluation of patient's response to treatment, examination of patient, ordering and review of laboratory studies, ordering and review  of radiographic studies, ordering and performing treatments and interventions, pulse oximetry, re-evaluation of patient's condition and review of old charts     Medications Ordered in ED Medications  ipratropium-albuterol (DUONEB) 0.5-2.5 (3) MG/3ML nebulizer solution 3 mL (3 mLs Nebulization Given 04/04/23 1654)  albuterol (PROVENTIL) (2.5 MG/3ML) 0.083% nebulizer solution (10 mg/hr Nebulization Given 04/04/23 1855)  methylPREDNISolone sodium succinate (SOLU-MEDROL) 125 mg/2 mL injection 125 mg (125 mg Intravenous Given 04/04/23 1852)    ED Course/ Medical Decision Making/ A&P                             Medical Decision Making Amount and/or Complexity of Data Reviewed Labs: ordered. Radiology: ordered.  Risk Prescription drug management. Decision regarding hospitalization.   This patient presents to the ED with chief complaint(s) of acute shortness of breath, wheezing and  tightness with pertinent past medical history of COPD -not currently on home oxygen.The complaint involves an extensive differential diagnosis and also carries with it a high risk of complications and morbidity.    The differential diagnosis includes : Acute COPD exacerbation, pneumonia, pleural effusion, pulmonary embolism, CHF, pulmonary hypertension, acute coronary syndrome  The initial plan is to get basic labs, chest x-ray and initiate breathing treatments.  Patient noted to have O2 sats of 87% at triage.   Additional history obtained: Additional history obtained from family  Records reviewed  pulmonary notes.  Based upon reading the H&P, it appears that patient at baseline requires a lot of albuterol anyways.  Independent labs interpretation:  The following labs were independently interpreted: BNP is normal, troponin is reassuring.  Independent visualization and interpretation of imaging: - I independently visualized the following imaging with scope of interpretation limited to determining acute life  threatening conditions related to emergency care: X-ray of the chest, which revealed no evidence of pneumonia.  There is some haziness in the lower lung fields bilaterally, but patient has diffuse wheezing on exam.  Not consistent with pneumonia.  Treatment and Reassessment: Patient given hour-long breathing treatment.  Upon reassessment, patient still has wheezing.  She is not comfortable going home, she has been taking multiple treatments at home. Will request admission.    Final Clinical Impression(s) / ED Diagnoses Final diagnoses:  Acute exacerbation of COPD with asthma (HCC)  COPD exacerbation (HCC)    Rx / DC Orders ED Discharge Orders     None         Derwood Kaplan, MD 04/04/23 2037

## 2023-04-04 NOTE — ED Notes (Addendum)
Pt noted with sats at 87% room air. Oxygen placed at 1 liter/min via nasal cannula.

## 2023-04-05 ENCOUNTER — Inpatient Hospital Stay (HOSPITAL_COMMUNITY): Payer: Medicare Other

## 2023-04-05 LAB — CBC
HCT: 42.5 % (ref 36.0–46.0)
Hemoglobin: 13.6 g/dL (ref 12.0–15.0)
MCH: 30.6 pg (ref 26.0–34.0)
MCHC: 32 g/dL (ref 30.0–36.0)
MCV: 95.5 fL (ref 80.0–100.0)
Platelets: 307 10*3/uL (ref 150–400)
RBC: 4.45 MIL/uL (ref 3.87–5.11)
RDW: 13.6 % (ref 11.5–15.5)
WBC: 5.8 10*3/uL (ref 4.0–10.5)
nRBC: 0 % (ref 0.0–0.2)

## 2023-04-05 LAB — BASIC METABOLIC PANEL
Anion gap: 6 (ref 5–15)
BUN: 16 mg/dL (ref 8–23)
CO2: 29 mmol/L (ref 22–32)
Calcium: 8.7 mg/dL — ABNORMAL LOW (ref 8.9–10.3)
Chloride: 101 mmol/L (ref 98–111)
Creatinine, Ser: 0.62 mg/dL (ref 0.44–1.00)
GFR, Estimated: >60 mL/min (ref >60)
Glucose, Bld: 143 mg/dL — ABNORMAL HIGH (ref 70–99)
Potassium: 4.4 mmol/L (ref 3.5–5.1)
Sodium: 136 mmol/L (ref 135–145)

## 2023-04-05 LAB — HIV ANTIBODY (ROUTINE TESTING W REFLEX): HIV Screen 4th Generation wRfx: NONREACTIVE

## 2023-04-05 MED ORDER — ALBUTEROL SULFATE (2.5 MG/3ML) 0.083% IN NEBU
2.5000 mg | INHALATION_SOLUTION | RESPIRATORY_TRACT | Status: DC | PRN
  Administered 2023-04-09: 2.5 mg via RESPIRATORY_TRACT
  Filled 2023-04-05: qty 3

## 2023-04-05 MED ORDER — IPRATROPIUM-ALBUTEROL 0.5-2.5 (3) MG/3ML IN SOLN
3.0000 mL | Freq: Four times a day (QID) | RESPIRATORY_TRACT | Status: DC
  Administered 2023-04-05 – 2023-04-10 (×19): 3 mL via RESPIRATORY_TRACT
  Filled 2023-04-05 (×20): qty 3

## 2023-04-05 MED ORDER — IPRATROPIUM-ALBUTEROL 0.5-2.5 (3) MG/3ML IN SOLN: 3.0000 mL | Freq: Two times a day (BID) | RESPIRATORY_TRACT | Status: DC

## 2023-04-05 NOTE — Plan of Care (Signed)
  Problem: Education: Goal: Knowledge of General Education information will improve Description: Including pain rating scale, medication(s)/side effects and non-pharmacologic comfort measures Outcome: Progressing   Problem: Health Behavior/Discharge Planning: Goal: Ability to manage health-related needs will improve Outcome: Progressing   Problem: Clinical Measurements: Goal: Ability to maintain clinical measurements within normal limits will improve Outcome: Progressing Goal: Will remain free from infection Outcome: Progressing Goal: Cardiovascular complication will be avoided Outcome: Progressing   Problem: Activity: Goal: Risk for activity intolerance will decrease Outcome: Progressing   Problem: Nutrition: Goal: Adequate nutrition will be maintained Outcome: Progressing   Problem: Coping: Goal: Level of anxiety will decrease Outcome: Progressing   Problem: Elimination: Goal: Will not experience complications related to bowel motility Outcome: Progressing Goal: Will not experience complications related to urinary retention Outcome: Progressing   Problem: Pain Managment: Goal: General experience of comfort will improve Outcome: Progressing   Problem: Safety: Goal: Ability to remain free from injury will improve Outcome: Progressing   Problem: Education: Goal: Knowledge of disease or condition will improve Outcome: Progressing Goal: Knowledge of the prescribed therapeutic regimen will improve Outcome: Progressing

## 2023-04-05 NOTE — Progress Notes (Signed)
Mobility Specialist - Progress Note  Pre-mobility: 93% SpO2 During mobility: 88% SpO2 Post-mobility: 90% SPO2   04/05/23 1440  Mobility  Activity Ambulated independently in hallway  Level of Assistance Standby assist, set-up cues, supervision of patient - no hands on  Assistive Device None  Distance Ambulated (ft) 700 ft  Range of Motion/Exercises Active  Activity Response Tolerated well  Mobility Referral Yes  $Mobility charge 1 Mobility  Mobility Specialist Start Time (ACUTE ONLY) 1430  Mobility Specialist Stop Time (ACUTE ONLY) 1440  Mobility Specialist Time Calculation (min) (ACUTE ONLY) 10 min   Pt was found in bed and agreeable to ambulate. Stated feeling a little SOB during ambulation SPO2 was between 88-89%. Upon returning to room able to increase SPO2 to 90%. Was left in bathroom and RN notified.  Billey Chang Mobility Specialist

## 2023-04-05 NOTE — Plan of Care (Signed)
?  Problem: Education: ?Goal: Knowledge of disease or condition will improve ?Outcome: Progressing ?  ?Problem: Activity: ?Goal: Will verbalize the importance of balancing activity with adequate rest periods ?Outcome: Progressing ?  ?

## 2023-04-05 NOTE — Progress Notes (Signed)
[facility]  PROGRESS NOTE  Lynn Kline:[id number] DOB: 06-24-1956 DOA: 04/04/2023 PCP: Etta Grandchild, MD   Brief HPI:   67 year old female with past medical history of COPD, CAD, diverticulosis came to ED with shortness of breath.  Patient says that she has hyperactive cough with progressive wheezing and weakness.  She has 40 pack history of smoking.  In the ED chest x-ray showed increasing compression of spinal level with hyperinflation chronic changes    Assessment/Plan:   COPD exacerbation -Acute hypoxemic respiratory failure -Patient was found to be hypoxemic with O2 sats in 80s, not on home oxygen -Started on prednisone 50 mg daily -Continue scheduled DuoNebs every 6 hours -Continue doxycycline  Compression fracture of thoracic spine -Will obtain MRI of thoracic spine without contrast  Generalized anxiety disorder -Continue Xanax    Medications     arformoterol  15 mcg Nebulization BID   budesonide (PULMICORT) nebulizer solution  0.25 mg Nebulization BID   doxycycline  100 mg Oral Q12H   enoxaparin (LOVENOX) injection  40 mg Subcutaneous Q24H   ipratropium-albuterol  3 mL Nebulization BID   predniSONE  50 mg Oral Q breakfast     Data Reviewed:   CBG:  No results for input(s): "GLUCAP" in the last 168 hours.  SpO2: 95 % O2 Flow Rate (L/min): 4 L/min    Vitals:   04/05/23 0900 04/05/23 0910 04/05/23 1109 04/05/23 1222  BP:   127/69 (!) 145/73  Pulse:   79 86  Resp:   20 20  Temp:   98.2 F (36.8 C) 98.2 F (36.8 C)  TempSrc:   Oral Oral  SpO2: (!) 84% 93% 96% 95%  Weight:      Height:          Data Reviewed:  Basic Metabolic Panel: Recent Labs  Lab 04/04/23 1558 04/04/23 2144 04/05/23 0438  NA 138  --  136  K 4.3  --  4.4  CL 100  --  101  CO2 30  --  29  GLUCOSE 104*  --  143*  BUN 10  --  16  CREATININE 0.60 0.66 0.62  CALCIUM 9.2  --  8.7*    CBC: Recent Labs  Lab 04/04/23 1558 04/04/23 2144 04/05/23 0438   WBC 9.6 9.0 5.8  HGB 15.2* 14.6 13.6  HCT 47.6* 45.0 42.5  MCV 95.2 96.6 95.5  PLT 346 303 307    LFT No results for input(s): "AST", "ALT", "ALKPHOS", "BILITOT", "PROT", "ALBUMIN" in the last 168 hours.   Antibiotics: Anti-infectives (From admission, onward)    Start     Dose/Rate Route Frequency Ordered Stop   04/04/23 2100  doxycycline (VIBRA-TABS) tablet 100 mg        100 mg Oral Every 12 hours 04/04/23 2053          DVT prophylaxis: Lovenox  Code Status: Full code  Family Communication: No family at bedside   CONSULTS none   Subjective   Still has mild shortness of breath.   Objective    Physical Examination:  General-appears in no acute distress Heart-S1-S2, regular, no murmur auscultated Lungs-clear to auscultation bilaterally, no wheezing or crackles auscultated Abdomen-soft, nontender, no organomegaly Extremities-no edema in the lower extremities Neuro-alert, oriented x3, no focal deficit noted   Status is: Inpatient:          Meredeth Ide   [facility] If 7PM-7AM, please contact night-coverage at [email or web address], Office  [phone]   04/05/2023, 4:09 PM  LOS: 1 day

## 2023-04-06 DIAGNOSIS — J45901 Unspecified asthma with (acute) exacerbation: Secondary | ICD-10-CM | POA: Diagnosis not present

## 2023-04-06 DIAGNOSIS — S22000A Wedge compression fracture of unspecified thoracic vertebra, initial encounter for closed fracture: Secondary | ICD-10-CM | POA: Diagnosis not present

## 2023-04-06 DIAGNOSIS — J441 Chronic obstructive pulmonary disease with (acute) exacerbation: Secondary | ICD-10-CM | POA: Diagnosis not present

## 2023-04-06 MED ORDER — METHYLPREDNISOLONE SODIUM SUCC 125 MG IJ SOLR
60.0000 mg | Freq: Two times a day (BID) | INTRAMUSCULAR | Status: DC
  Administered 2023-04-06 – 2023-04-10 (×9): 60 mg via INTRAVENOUS
  Filled 2023-04-06 (×11): qty 2

## 2023-04-06 NOTE — H&P (Signed)
History:    67 year old female comes in with chief complaint of shortness of breath.  Patient has history of COPD, coronary atherosclerosis but no history of MI, 45+ pack year tobacco use disorder.  She states that she has been feeling short of breath for the last week.  Her symptoms have progressed.  She has been using her maintenance inhaler as prescribed and has been using albuterol 6-10 times the last 2 or 3 days.  She gets transient relief only.  Patient has chest tightness and wheezing.  She feels that there is congestion in the throat.  No upper respiratory infection-like symptoms.  No specific triggers.  Patient denies any fevers, chills.  She has not required ED visit for COPD in several years.   Pt has no hx of PE, DVT and denies any exogenous hormone (testosterone / estrogen) use, long distance travels or surgery in the past 6 weeks, active cancer, recent immobilization.  Past Medical History:  Diagnosis Date   Anxiety    Atherosclerosis    Colon polyp, hyperplastic    COPD (chronic obstructive pulmonary disease) (HCC)    Diverticulosis    Gallstone    Pneumonia     Allergies  Allergen Reactions   Codeine Nausea And Vomiting   Erythromycin Nausea And Vomiting   Ampicillin Nausea And Vomiting, Rash and Other (See Comments)    Pt states that rash was on her tongue.  Has patient had a PCN reaction causing immediate rash, facial/tongue/throat swelling, SOB or lightheadedness with hypotension yes Has patient had a PCN reaction causing severe rash involving mucus membranes or skin necrosis: no Has patient had a PCN reaction that required hospitalization yes Has patient had a PCN reaction occurring within the last 10 years: no If all of the above answers are "NO", then may proceed with Cephalosporin use.     No current facility-administered medications on file prior to encounter.   Current Outpatient Medications on File Prior to Encounter  Medication Sig Dispense Refill    albuterol (PROVENTIL) (2.5 MG/3ML) 0.083% nebulizer solution USE 3 ML VIA NEBULIZER EVERY 4 HOURS AS NEEDED FOR WHEEZING OR SHORTNESS OF BREATH (Patient taking differently: Take 2.5 mg by nebulization every 4 (four) hours as needed for shortness of breath or wheezing.) 75 mL 5   ALPRAZolam (XANAX) 0.25 MG tablet 1 tab yb mouth twice per day as needed 60 tablet 3   Budeson-Glycopyrrol-Formoterol (BREZTRI AEROSPHERE) 160-9-4.8 MCG/ACT AERO Inhale 2 puffs into the lungs in the morning and at bedtime. 32.1 g 3   VENTOLIN HFA 108 (90 Base) MCG/ACT inhaler INHALE 1 TO 2 PUFFS INTO THE LUNGS EVERY 6 HOURS AS NEEDED FOR WHEEZING OR SHORTNESS OF BREATH (Patient taking differently: Inhale 1-2 puffs into the lungs every 6 (six) hours as needed for wheezing.) 18 g 3   cyanocobalamin 2000 MCG tablet Take 1 tablet (2,000 mcg total) by mouth daily. (Patient not taking: Reported on 04/04/2023) 90 tablet 1    Physical Exam: Vitals:   04/06/23 0735 04/06/23 1255  BP:  127/78  Pulse:  88  Resp:  16  Temp:  98 F (36.7 C)  SpO2: 98% 97%   Body mass index is 24.75 kg/m. Alert and oriented x 3 No shortness of breath or chest pain.  Positive chronic cough due to history of COPD. Abdomen soft and nontender.  No loss of bowel or bladder control no incontinence of bowel or bladder. Ambulating without assistive device. Positive thoracic kyphosis is noted on  inspection.  She has pain and tenderness in the mid thoracic region.  No ecchymosis or bruising.  Mild lumbar spine pain with palpation. No focal motor or sensory deficits in the lower extremity on clinical exam.  Negative Babinski test, no clonus, negative straight leg raise test.  Compartments are soft and nontender in the lower extremity.  2+ dorsalis pedis and posterior tibialis pulses bilaterally.  Image: MR THORACIC SPINE WO CONTRAST  Result Date: 04/05/2023 CLINICAL DATA:  Compression fracture. EXAM: MRI THORACIC SPINE WITHOUT CONTRAST TECHNIQUE:  Multiplanar, multisequence MR imaging of the thoracic spine was performed. No intravenous contrast was administered. COMPARISON:  CT chest 08/06/2022, two-view chest radiograph 04/04/2023 FINDINGS: Alignment: There is exaggerated thoracic kyphosis centered at T8. There is grade 1 anterolisthesis of T4 on T5, T6 on T7, and T7 on T8, increased at T7-T8 compared to the CT from 2023. Vertebrae: There is compression deformity of the T7 vertebral body with an associated prominent Schmorl's node along the superior endplate resulting in up to approximately 50% loss of vertebral body height anteriorly. There is no bony retropulsion. This is unchanged compared to the CT chest from 2023. There is severe compression deformity of the T8 vertebral body with near-complete loss of vertebral body height centrally and mild bony retropulsion measuring approximately 3 mm which is new since the CT from 2023. There is no significant spinal canal stenosis or cord compression. There is mild marrow edema consistent with acute to subacute chronicity. There is no definite extension into the posterior elements. The other vertebral body heights are preserved. There is no other marrow edema. There is an intraosseous hemangioma in the T12 vertebral body. There is no suspicious marrow signal abnormality. Cord:  Normal in signal and morphology. Paraspinal and other soft tissues: Unremarkable. Disc levels: The disc heights in the thoracic spine are overall preserved. There are small disc protrusions at T7-T8 through L2-L3 without significant spinal canal or neural foraminal stenosis. IMPRESSION: 1. Severe compression deformity of the T8 vertebral body with near-complete loss of vertebral body height centrally and 3 mm bony retropulsion is new since the CT from 08/06/2022, with marrow edema suggesting acute to subacute chronicity. No significant spinal canal stenosis or cord compression. 2. Compression deformity of the T7 vertebral body with  approximately 50% loss of vertebral body height anteriorly is unchanged since the CT. 3. Mild degenerative changes without significant spinal canal or neural foraminal stenosis. Electronically Signed   By: Lesia Hausen M.D.   On: 04/05/2023 16:14   DG Chest 2 View  Result Date: 04/04/2023 CLINICAL DATA:  Shortness of breath EXAM: CHEST - 2 VIEW COMPARISON:  X-ray 12/05/2022 and older FINDINGS: Hyperinflation. Left apical pleural thickening and scarring. No consolidation or edema. Normal cardiopericardial silhouette. There is blunting of the inferior costophrenic angles. Pleural thickening is possible rather than effusion. Surgical clips overlie the left upper chest. There is kyphosis with compression of a midthoracic spine vertebral level which is progressive from the prior x-ray. Please correlate with clinical history. IMPRESSION: Hyperinflation with chronic changes. Increasing, now severe compression of the midthoracic spine level compared to previous. Now developing kyphosis. Please correlate for any known history and if needed additional spinal workup as clinically appropriate to exclude a marrow replacement process or neoplasm. Electronically Signed   By: Karen Kays M.D.   On: 04/04/2023 17:28    A/P: Lydianna is a very pleasant 67 year old woman who has had chronic thoracic pain that became acutely worse.  Patient presented to the ER with  shortness of breath and wheezing and was admitted on 04/04/2023.  Imaging demonstrated age-indeterminate compression fractures and orthopedic spine consultation was requested.  Patient is neurologically intact with no signs or symptoms of myelopathy or radiculopathy.  Imaging demonstrates chronic T7 compression fracture as well as an acute on chronic T8 compression fracture.  Clinically the patient's pain is currently controlled with oral medications.  At this point neither fracture is amenable to a kyphoplasty.  Recommend pain medical management and physical therapy.   Would also recommend formal osteoporosis workup so that appropriate medications and treatment can be rendered to prevent additional fractures.  Patient can follow-up with me in 4 weeks for repeat x-rays and we will monitor the healing of the fracture over the next 3 months.  I will sign off if there is any questions or concerns please do not hesitate to contact me.

## 2023-04-06 NOTE — Progress Notes (Signed)
[facility]  PROGRESS NOTE  JOLANI GINDI ZOX:[id number] DOB: Sep 17, 1956 DOA: 04/04/2023 PCP: Etta Grandchild, MD   Brief HPI:   67 year old female with past medical history of COPD, CAD, diverticulosis came to ED with shortness of breath.  Patient says that she has hyperactive cough with progressive wheezing and weakness.  She has 40 pack history of smoking.  In the ED chest x-ray showed increasing compression of spinal level with hyperinflation chronic changes    Assessment/Plan:   COPD exacerbation -Acute hypoxemic respiratory failure -Patient was found to be hypoxemic with O2 sats in 80s, not on home oxygen -Started on prednisone 50 mg daily; will discontinue prednisone and start Solu-Medrol 60 mg IV every 12 hours -Continue scheduled DuoNebs every 6 hours -Continue doxycycline  Compression fracture of thoracic spine -MRI of the spine obtained showed compression fractures at T7 and T8; chronic -It does show near complete loss of vertebral body height centrally and 3 mm bony retropulsion is new since CT from 10/23 with marrow edema suggesting acute to subacute chronicity no significant spinal cord stenosis or cord compression -Seen on thoracic spine x-ray from 2019   Generalized anxiety disorder -Continue Xanax    Medications     arformoterol  15 mcg Nebulization BID   budesonide (PULMICORT) nebulizer solution  0.25 mg Nebulization BID   doxycycline  100 mg Oral Q12H   enoxaparin (LOVENOX) injection  40 mg Subcutaneous Q24H   ipratropium-albuterol  3 mL Nebulization Q6H   methylPREDNISolone (SOLU-MEDROL) injection  60 mg Intravenous Q12H     Data Reviewed:   CBG:  No results for input(s): "GLUCAP" in the last 168 hours.  SpO2: 98 % O2 Flow Rate (L/min): 4 L/min    Vitals:   04/05/23 2012 04/06/23 0121 04/06/23 0343 04/06/23 0735  BP: (!) 155/82  (!) 143/85   Pulse: 97  96   Resp: 20  20   Temp: 98.2 F (36.8 C)  97.9 F (36.6 C)   TempSrc: Oral   Oral   SpO2: 95% 94% 100% 98%  Weight:      Height:          Data Reviewed:  Basic Metabolic Panel: Recent Labs  Lab 04/04/23 1558 04/04/23 2144 04/05/23 0438  NA 138  --  136  K 4.3  --  4.4  CL 100  --  101  CO2 30  --  29  GLUCOSE 104*  --  143*  BUN 10  --  16  CREATININE 0.60 0.66 0.62  CALCIUM 9.2  --  8.7*    CBC: Recent Labs  Lab 04/04/23 1558 04/04/23 2144 04/05/23 0438  WBC 9.6 9.0 5.8  HGB 15.2* 14.6 13.6  HCT 47.6* 45.0 42.5  MCV 95.2 96.6 95.5  PLT 346 303 307    LFT No results for input(s): "AST", "ALT", "ALKPHOS", "BILITOT", "PROT", "ALBUMIN" in the last 168 hours.   Antibiotics: Anti-infectives (From admission, onward)    Start     Dose/Rate Route Frequency Ordered Stop   04/04/23 2100  doxycycline (VIBRA-TABS) tablet 100 mg        100 mg Oral Every 12 hours 04/04/23 2053          DVT prophylaxis: Lovenox  Code Status: Full code  Family Communication: No family at bedside   CONSULTS none   Subjective   Continues to have wheezing and feels short of breath.  MRI of thoracic spine showed compression fracture at T7 and T8.   Objective  Physical Examination:  General-appears in no acute distress Heart-S1-S2, regular, no murmur auscultated Lungs-bilateral wheezing auscultated Abdomen-soft, nontender, no organomegaly Extremities-no edema in the lower extremities Neuro-alert, oriented x3, no focal deficit noted   Status is: Inpatient:          Kayleigh Broadwell S Emad Brechtel   [facility] If 7PM-7AM, please contact night-coverage at [email or web address], Office  [phone]   04/06/2023, 11:42 AM  LOS: 2 days

## 2023-04-06 NOTE — Plan of Care (Signed)
  Problem: Education: Goal: Knowledge of General Education information will improve Description: Including pain rating scale, medication(s)/side effects and non-pharmacologic comfort measures Outcome: Progressing   Problem: Health Behavior/Discharge Planning: Goal: Ability to manage health-related needs will improve Outcome: Progressing   Problem: Clinical Measurements: Goal: Ability to maintain clinical measurements within normal limits will improve Outcome: Progressing Goal: Will remain free from infection Outcome: Progressing Goal: Diagnostic test results will improve Outcome: Progressing Goal: Cardiovascular complication will be avoided Outcome: Progressing   Problem: Activity: Goal: Risk for activity intolerance will decrease Outcome: Progressing   Problem: Nutrition: Goal: Adequate nutrition will be maintained Outcome: Progressing   Problem: Coping: Goal: Level of anxiety will decrease Outcome: Progressing   Problem: Elimination: Goal: Will not experience complications related to bowel motility Outcome: Progressing Goal: Will not experience complications related to urinary retention Outcome: Progressing   

## 2023-04-06 NOTE — Plan of Care (Signed)
  Problem: Education: Goal: Knowledge of General Education information will improve Description: Including pain rating scale, medication(s)/side effects and non-pharmacologic comfort measures Outcome: Progressing   Problem: Health Behavior/Discharge Planning: Goal: Ability to manage health-related needs will improve Outcome: Progressing   Problem: Coping: Goal: Level of anxiety will decrease Outcome: Progressing   Problem: Clinical Measurements: Goal: Respiratory complications will improve Outcome: Not Progressing   

## 2023-04-07 DIAGNOSIS — S22000A Wedge compression fracture of unspecified thoracic vertebra, initial encounter for closed fracture: Secondary | ICD-10-CM | POA: Diagnosis not present

## 2023-04-07 DIAGNOSIS — J441 Chronic obstructive pulmonary disease with (acute) exacerbation: Secondary | ICD-10-CM | POA: Diagnosis not present

## 2023-04-07 LAB — VITAMIN D 25 HYDROXY (VIT D DEFICIENCY, FRACTURES): Vit D, 25-Hydroxy: 4.64 ng/mL — ABNORMAL LOW (ref 30–100)

## 2023-04-07 NOTE — Plan of Care (Signed)
  Problem: Education: Goal: Knowledge of General Education information will improve Description: Including pain rating scale, medication(s)/side effects and non-pharmacologic comfort measures Outcome: Progressing   Problem: Health Behavior/Discharge Planning: Goal: Ability to manage health-related needs will improve Outcome: Progressing   Problem: Clinical Measurements: Goal: Ability to maintain clinical measurements within normal limits will improve Outcome: Progressing Goal: Will remain free from infection Outcome: Progressing Goal: Diagnostic test results will improve Outcome: Progressing Goal: Cardiovascular complication will be avoided Outcome: Progressing   Problem: Activity: Goal: Risk for activity intolerance will decrease Outcome: Progressing   Problem: Nutrition: Goal: Adequate nutrition will be maintained Outcome: Progressing   Problem: Coping: Goal: Level of anxiety will decrease Outcome: Progressing   Problem: Elimination: Goal: Will not experience complications related to bowel motility Outcome: Progressing Goal: Will not experience complications related to urinary retention Outcome: Progressing   Problem: Pain Managment: Goal: General experience of comfort will improve Outcome: Progressing   

## 2023-04-07 NOTE — Plan of Care (Signed)
?  Problem: Education: ?Goal: Knowledge of disease or condition will improve ?Outcome: Progressing ?  ?Problem: Activity: ?Goal: Ability to tolerate increased activity will improve ?Outcome: Progressing ?  ?Problem: Respiratory: ?Goal: Ability to maintain a clear airway will improve ?Outcome: Progressing ?Goal: Levels of oxygenation will improve ?Outcome: Progressing ?  ?

## 2023-04-07 NOTE — Progress Notes (Signed)
[facility]  PROGRESS NOTE  Lynn Kline ZOX:[id number] DOB: 03-Sep-1956 DOA: 04/04/2023 PCP: Etta Grandchild, MD   Brief HPI:   67 year old female with past medical history of COPD, CAD, diverticulosis came to ED with shortness of breath.  Patient says that she has hyperactive cough with progressive wheezing and weakness.  She has 40 pack history of smoking.  In the ED chest x-ray showed increasing compression of spinal level with hyperinflation chronic changes    Assessment/Plan:   COPD exacerbation -Acute hypoxemic respiratory failure -Patient was found to be hypoxemic with O2 sats in 80s, not on home oxygen -Started on prednisone 50 mg daily; prednisone was discontinued and patient started on Solu-Medrol 60 mg IV every 12 hours  -Continue scheduled DuoNebs every 6 hours -Continue Pulmicort -Continue doxycycline  Compression fracture of thoracic spine -MRI of the spine obtained showed compression fractures at T7 and T8; chronic -It does show near complete loss of vertebral body height centrally and 3 mm bony retropulsion is new since CT from 10/23 with marrow edema suggesting acute to subacute chronicity no significant spinal cord stenosis or cord compression -Seen on thoracic spine x-ray from 2019 -Orthopedics consulted, no intervention recommended as these fractures appear to be chronic.  Pain is well-controlled.  Medical management and PT recommended.  Patient should get osteoporosis workup as outpatient.  Will check vitamin D level.  Her last DEXA scan was from 2019, will need a repeat DEXA scan as outpatient.   Generalized anxiety disorder -Continue Xanax    Medications     arformoterol  15 mcg Nebulization BID   budesonide (PULMICORT) nebulizer solution  0.25 mg Nebulization BID   doxycycline  100 mg Oral Q12H   enoxaparin (LOVENOX) injection  40 mg Subcutaneous Q24H   ipratropium-albuterol  3 mL Nebulization Q6H   methylPREDNISolone (SOLU-MEDROL) injection  60  mg Intravenous Q12H     Data Reviewed:   CBG:  No results for input(s): "GLUCAP" in the last 168 hours.  SpO2: 92 % O2 Flow Rate (L/min): 4 L/min    Vitals:   04/06/23 2031 04/07/23 0310 04/07/23 0441 04/07/23 0736  BP: 114/80  115/72   Pulse: 92  93   Resp: 18  18   Temp: 98.3 F (36.8 C)  97.8 F (36.6 C)   TempSrc: Oral  Oral   SpO2: 94% 95% 94% 92%  Weight:      Height:          Data Reviewed:  Basic Metabolic Panel: Recent Labs  Lab 04/04/23 1558 04/04/23 2144 04/05/23 0438  NA 138  --  136  K 4.3  --  4.4  CL 100  --  101  CO2 30  --  29  GLUCOSE 104*  --  143*  BUN 10  --  16  CREATININE 0.60 0.66 0.62  CALCIUM 9.2  --  8.7*    CBC: Recent Labs  Lab 04/04/23 1558 04/04/23 2144 04/05/23 0438  WBC 9.6 9.0 5.8  HGB 15.2* 14.6 13.6  HCT 47.6* 45.0 42.5  MCV 95.2 96.6 95.5  PLT 346 303 307    LFT No results for input(s): "AST", "ALT", "ALKPHOS", "BILITOT", "PROT", "ALBUMIN" in the last 168 hours.   Antibiotics: Anti-infectives (From admission, onward)    Start     Dose/Rate Route Frequency Ordered Stop   04/04/23 2100  doxycycline (VIBRA-TABS) tablet 100 mg        100 mg Oral Every 12 hours 04/04/23 2053  DVT prophylaxis: Lovenox  Code Status: Full code  Family Communication: No family at bedside   CONSULTS none   Subjective   Patient seen and examined, breathing is improved.  Appreciate orthopedic recommendation, no intervention recommended for T7-T8 compression fractures at this time.  Objective    Physical Examination:  General-appears in no acute distress Heart-S1-S2, regular, no murmur auscultated Lungs-scattered wheezing bilaterally Abdomen-soft, nontender, no organomegaly Extremities-no edema in the lower extremities Neuro-alert, oriented x3, no focal deficit noted  Status is: Inpatient:          [name] S [name]   [facility] If 7PM-7AM, please contact night-coverage at  [email or web address], Office  [phone]   04/07/2023, 8:50 AM  LOS: 3 days

## 2023-04-07 NOTE — Progress Notes (Signed)
Mobility Specialist - Progress Note  Pre-mobility: 110 bpm HR, 93% SpO2 During mobility: 111 bpm HR, 91% SpO2 Post-mobility: 105 bpm HR, 97% SPO2   04/07/23 0927  Oxygen Therapy  O2 Device Nasal Cannula  O2 Flow Rate (L/min) 4 L/min  Patient Activity (if Appropriate) Ambulating  Mobility  Activity Ambulated independently in hallway  Level of Assistance Standby assist, set-up cues, supervision of patient - no hands on  Assistive Device None  Distance Ambulated (ft) 1050 ft  Range of Motion/Exercises Active  Activity Response Tolerated well  Mobility Referral Yes  $Mobility charge 1 Mobility  Mobility Specialist Start Time (ACUTE ONLY) [id number]  Mobility Specialist Stop Time (ACUTE ONLY) 0926  Mobility Specialist Time Calculation (min) (ACUTE ONLY) 20 min   Pt was found on recliner chair and agreeable to ambulate. Had no complaints during session. At EOS returned to recliner chair with all needs met. Call bell in reach.  Billey Chang Mobility Specialist

## 2023-04-07 NOTE — Care Management Important Message (Signed)
Important Message  Patient Details IM Letter given. Name: Lynn Kline MRN: [id number] Date of Birth: [birth year]   Medicare Important Message Given:  Yes     Caren Macadam 04/07/2023, 11:45 AM

## 2023-04-08 DIAGNOSIS — J441 Chronic obstructive pulmonary disease with (acute) exacerbation: Secondary | ICD-10-CM | POA: Diagnosis not present

## 2023-04-08 MED ORDER — VITAMIN D (ERGOCALCIFEROL) 1.25 MG (50000 UNIT) PO CAPS
50000.0000 [IU] | ORAL_CAPSULE | ORAL | Status: DC
  Administered 2023-04-08: 50000 [IU] via ORAL
  Filled 2023-04-08: qty 1

## 2023-04-08 NOTE — Progress Notes (Signed)
Mobility Specialist - Progress Note  Pre-mobility: 105 bpm HR, 94% SpO2 During mobility: 112 bpm HR, 88% SpO2 Post-mobility: 98 bpm HR, 95% SPO2   04/08/23 1449  Oxygen Therapy  O2 Device Nasal Cannula  O2 Flow Rate (L/min) 2 L/min  Patient Activity (if Appropriate) Ambulating  Mobility  Activity Ambulated independently in hallway  Level of Assistance Independent  Assistive Device None  Distance Ambulated (ft) 1050 ft  Range of Motion/Exercises Active  Activity Response Tolerated well  Mobility Referral Yes  $Mobility charge 1 Mobility  Mobility Specialist Start Time (ACUTE ONLY) 1432  Mobility Specialist Stop Time (ACUTE ONLY) 1449  Mobility Specialist Time Calculation (min) (ACUTE ONLY) 17 min   Pt was found on recliner chair and agreeable to ambulate. Grew winded with session and SPO2 dropped briefly to 88% but increased >90% within seconds. At EOS returned to recliner chair with all needs met and call bell in reach.  Billey Chang Mobility Specialist

## 2023-04-08 NOTE — Progress Notes (Signed)
Mobility Specialist - Progress Note   Pre-mobility: 92 bpm HR, 96% SpO2 During mobility: 105 bpm HR, 90% SpO2 Post-mobility: 116 bpm HR, 93% SPO2   04/08/23 1014  Oxygen Therapy  O2 Device Nasal Cannula  O2 Flow Rate (L/min) 3 L/min  Patient Activity (if Appropriate) Ambulating  Mobility  Activity Ambulated independently in hallway  Level of Assistance Independent  Assistive Device None  Distance Ambulated (ft) 700 ft  Range of Motion/Exercises Active  Activity Response Tolerated well  Mobility Referral Yes  $Mobility charge 1 Mobility  Mobility Specialist Start Time (ACUTE ONLY) [id number]  Mobility Specialist Stop Time (ACUTE ONLY) 1015  Mobility Specialist Time Calculation (min) (ACUTE ONLY) 17 min   Pt was found sitting EOB and agreeable to ambulate. Had no complaints during ambulation. At EOS returned to use bathroom and all needs met.  Billey Chang Mobility Specialist

## 2023-04-08 NOTE — TOC CM/SW Note (Signed)
Transition of Care [facility]) - Inpatient Brief Assessment   Patient Details  Name: Lynn Kline MRN: [id number] Date of Birth: 02-Nov-1955  [facility] of [facility]) CM/SW Contact:    Amada Jupiter, LCSW Phone Number: 04/08/2023, 3:07 PM   Clinical Narrative: No TOC needs identified at this time, however, will monitor if pt may require home O2.   Transition of Care Asessment: Insurance and Status: Insurance coverage has been reviewed Patient has primary care physician: Yes Home environment has been reviewed: home with family able to assist if needed Prior level of function:: independent Prior/Current Home Services: No current home services Social Determinants of Health Reivew: SDOH reviewed no interventions necessary Readmission risk has been reviewed: Yes Transition of care needs: no transition of care needs at this time

## 2023-04-08 NOTE — Progress Notes (Signed)
[facility]  PROGRESS NOTE  Lynn Kline NGE:[id number] DOB: 1956-09-10 DOA: 04/04/2023 PCP: Etta Grandchild, MD   Brief HPI:   67 year old female with past medical history of COPD, CAD, diverticulosis came to ED with shortness of breath.  Patient says that she has hyperactive cough with progressive wheezing and weakness.  She has 40 pack history of smoking.  In the ED chest x-ray showed increasing compression of spinal level with hyperinflation chronic changes    Assessment/Plan:   COPD exacerbation -Acute hypoxemic respiratory failure -Patient was found to be hypoxemic with O2 sats in 80s, not on home oxygen -Currently requiring 3 to 4 L of oxygen via nasal cannula -Started on prednisone 50 mg daily; prednisone was discontinued and patient started on Solu-Medrol 60 mg IV every 12 hours  -Continue scheduled DuoNebs every 6 hours -Continue Pulmicort -Continue doxycycline  Compression fracture of thoracic spine -MRI of the spine obtained showed compression fractures at T7 and T8; chronic -It does show near complete loss of vertebral body height centrally and 3 mm bony retropulsion is new since CT from 10/23 with marrow edema suggesting acute to subacute chronicity no significant spinal cord stenosis or cord compression -Seen on thoracic spine x-ray from 2019 -Orthopedics consulted, no intervention recommended as these fractures appear to be chronic.  Pain is well-controlled.  Medical management and PT recommended.  Patient should get osteoporosis workup as outpatient.  Will check vitamin D level.  Her last DEXA scan was from 2019, will need a repeat DEXA scan as outpatient. -Ordered vitamin D level; came back low at 4.64  Vitamin D deficiency -Vitamin D, 25-hydroxy was significantly low at 4.64 -Started vitamin D 2 50,000 units weekly starting today at least for 3 months -Will need to check vitamin D level in 3 months  Generalized anxiety disorder -Continue  Xanax    Medications     arformoterol  15 mcg Nebulization BID   budesonide (PULMICORT) nebulizer solution  0.25 mg Nebulization BID   doxycycline  100 mg Oral Q12H   enoxaparin (LOVENOX) injection  40 mg Subcutaneous Q24H   ipratropium-albuterol  3 mL Nebulization Q6H   methylPREDNISolone (SOLU-MEDROL) injection  60 mg Intravenous Q12H   Vitamin D (Ergocalciferol)  50,000 Units Oral Q7 days     Data Reviewed:   CBG:  No results for input(s): "GLUCAP" in the last 168 hours.  SpO2: 91 % O2 Flow Rate (L/min): 2 L/min FiO2 (%): 36 %    Vitals:   04/07/23 2023 04/08/23 0532 04/08/23 1100 04/08/23 1131  BP: (!) 144/80 (!) 149/96 130/80 134/82  Pulse: 92 81 92 89  Resp: 20 18 17 17   Temp: 98.3 F (36.8 C) 98.1 F (36.7 C) 97.7 F (36.5 C) 97.7 F (36.5 C)  TempSrc: Oral Oral Oral Oral  SpO2: 98% 96% 91% 91%  Weight:      Height:          Data Reviewed:  Basic Metabolic Panel: Recent Labs  Lab 04/04/23 1558 04/04/23 2144 04/05/23 0438  NA 138  --  136  K 4.3  --  4.4  CL 100  --  101  CO2 30  --  29  GLUCOSE 104*  --  143*  BUN 10  --  16  CREATININE 0.60 0.66 0.62  CALCIUM 9.2  --  8.7*    CBC: Recent Labs  Lab 04/04/23 1558 04/04/23 2144 04/05/23 0438  WBC 9.6 9.0 5.8  HGB 15.2* 14.6 13.6  HCT 47.6*  45.0 42.5  MCV 95.2 96.6 95.5  PLT 346 303 307    LFT No results for input(s): "AST", "ALT", "ALKPHOS", "BILITOT", "PROT", "ALBUMIN" in the last 168 hours.   Antibiotics: Anti-infectives (From admission, onward)    Start     Dose/Rate Route Frequency Ordered Stop   04/04/23 2100  doxycycline (VIBRA-TABS) tablet 100 mg        100 mg Oral Every 12 hours 04/04/23 2053          DVT prophylaxis: Lovenox  Code Status: Full code  Family Communication: No family at bedside   CONSULTS none   Subjective   Still wheezing.  Requiring 3 to 4 L of oxygen via nasal cannula.  She is not on oxygen at home.  Vitamin D level is significantly  low at 4.64  Objective    Physical Examination:  General-appears in no acute distress Heart-S1-S2, regular, no murmur auscultated Lungs-bilateral wheezing auscultated Abdomen-soft, nontender, no organomegaly Extremities-no edema in the lower extremities Neuro-alert, oriented x3, no focal deficit noted  Status is: Inpatient:          Meredeth Ide   [facility] If 7PM-7AM, please contact night-coverage at [email or web address], Office  [phone]   04/08/2023, 12:37 PM  LOS: 4 days

## 2023-04-09 DIAGNOSIS — J441 Chronic obstructive pulmonary disease with (acute) exacerbation: Secondary | ICD-10-CM | POA: Diagnosis not present

## 2023-04-09 MED ORDER — ACETYLCYSTEINE 20 % IN SOLN
4.0000 mL | Freq: Once | RESPIRATORY_TRACT | Status: AC
  Administered 2023-04-09: 4 mL via RESPIRATORY_TRACT
  Filled 2023-04-09: qty 4

## 2023-04-09 MED ORDER — GUAIFENESIN ER 600 MG PO TB12
1200.0000 mg | ORAL_TABLET | Freq: Two times a day (BID) | ORAL | Status: DC
  Administered 2023-04-09 – 2023-04-10 (×3): 1200 mg via ORAL
  Filled 2023-04-09 (×3): qty 2

## 2023-04-09 NOTE — Progress Notes (Signed)
[facility]  PROGRESS NOTE  Lynn Kline ZOX:[id number] DOB: [birth year] DOA: 04/04/2023 PCP: Etta Grandchild, MD   Brief HPI:   67 year old female with past medical history of COPD, CAD, diverticulosis came to ED with shortness of breath.  Patient says that she has hyperactive cough with progressive wheezing and weakness.  She has 40 pack history of smoking.  In the ED chest x-ray showed increasing compression of spinal level with hyperinflation chronic changes    Assessment/Plan:   COPD exacerbation -Acute hypoxemic respiratory failure -Patient was found to be hypoxemic with O2 sats in 80s, not on home oxygen -Currently requiring 3 to 4 L of oxygen via nasal cannula -Started on prednisone 50 mg daily; prednisone was discontinued and patient started on Solu-Medrol 60 mg IV every 12 hours  -Continue scheduled DuoNebs every 6 hours -Continue Pulmicort -Continue doxycycline -Will add Mucinex 1200 mg p.o. twice daily -Mucomyst nebulizer x 1  Compression fracture of thoracic spine -MRI of the spine obtained showed compression fractures at T7 and T8; chronic -It does show near complete loss of vertebral body height centrally and 3 mm bony retropulsion is new since CT from 10/23 with marrow edema suggesting acute to subacute chronicity no significant spinal cord stenosis or cord compression -Seen on thoracic spine x-ray from 2019 -Orthopedics consulted, no intervention recommended as these fractures appear to be chronic.  Pain is well-controlled.  Medical management and PT recommended.  Patient should get osteoporosis workup as outpatient.  Will check vitamin D level.  Her last DEXA scan was from 2019, will need a repeat DEXA scan as outpatient. -Ordered vitamin D level; came back low at 4.64  Vitamin D deficiency -Vitamin D, 25-hydroxy was significantly low at 4.64 -Started vitamin D 2 50,000 units weekly starting today at least for 3 months -Will need to check vitamin D level in 3  months  Generalized anxiety disorder -Continue Xanax    Medications     acetylcysteine  4 mL Nebulization Once   arformoterol  15 mcg Nebulization BID   budesonide (PULMICORT) nebulizer solution  0.25 mg Nebulization BID   doxycycline  100 mg Oral Q12H   enoxaparin (LOVENOX) injection  40 mg Subcutaneous Q24H   guaiFENesin  1,200 mg Oral BID   ipratropium-albuterol  3 mL Nebulization Q6H   methylPREDNISolone (SOLU-MEDROL) injection  60 mg Intravenous Q12H   Vitamin D (Ergocalciferol)  50,000 Units Oral Q7 days     Data Reviewed:   CBG:  No results for input(s): "GLUCAP" in the last 168 hours.  SpO2: 93 % O2 Flow Rate (L/min): 2 L/min FiO2 (%): 28 %    Vitals:   04/08/23 2115 04/09/23 0235 04/09/23 0403 04/09/23 0818  BP:   131/70   Pulse:   90   Resp:   18   Temp:   98.3 F (36.8 C)   TempSrc:   Oral   SpO2: 98% 95% 96% 93%  Weight:      Height:          Data Reviewed:  Basic Metabolic Panel: Recent Labs  Lab 04/04/23 1558 04/04/23 2144 04/05/23 0438  NA 138  --  136  K 4.3  --  4.4  CL 100  --  101  CO2 30  --  29  GLUCOSE 104*  --  143*  BUN 10  --  16  CREATININE 0.60 0.66 0.62  CALCIUM 9.2  --  8.7*    CBC: Recent Labs  Lab 04/04/23 1558  04/04/23 2144 04/05/23 0438  WBC 9.6 9.0 5.8  HGB 15.2* 14.6 13.6  HCT 47.6* 45.0 42.5  MCV 95.2 96.6 95.5  PLT 346 303 307    LFT No results for input(s): "AST", "ALT", "ALKPHOS", "BILITOT", "PROT", "ALBUMIN" in the last 168 hours.   Antibiotics: Anti-infectives (From admission, onward)    Start     Dose/Rate Route Frequency Ordered Stop   04/04/23 2100  doxycycline (VIBRA-TABS) tablet 100 mg        100 mg Oral Every 12 hours 04/04/23 2053          DVT prophylaxis: Lovenox  Code Status: Full code  Family Communication: No family at bedside   CONSULTS none   Subjective   Coughing, but unable to cough up phlegm  Objective    Physical Examination:  General-appears in no  acute distress Heart-S1-S2, regular, no murmur auscultated Lungs-bilateral rhonchi auscultated Abdomen-soft, nontender, no organomegaly Extremities-no edema in the lower extremities Neuro-alert, oriented x3, no focal deficit noted  Status is: Inpatient:          Meredeth Ide   [facility] If 7PM-7AM, please contact night-coverage at [email or web address], Office  [phone]   04/09/2023, 11:40 AM  LOS: 5 days

## 2023-04-09 NOTE — Progress Notes (Signed)
Mobility Specialist - Progress Note  Pre-mobility: 91 bpm HR, 95% SpO2 During mobility: 115 bpm HR, 88-100% SpO2 Post-mobility: 105 bpm HR, 94% SPO2   04/09/23 0912  Oxygen Therapy  O2 Device Nasal Cannula  O2 Flow Rate (L/min) 2 L/min  Patient Activity (if Appropriate) Ambulating  Mobility  Activity Ambulated independently in hallway  Level of Assistance Independent  Assistive Device None  Distance Ambulated (ft) 1050 ft  Range of Motion/Exercises Active  Activity Response Tolerated well  Mobility Referral Yes  $Mobility charge 1 Mobility  Mobility Specialist Start Time (ACUTE ONLY) 0857  Mobility Specialist Stop Time (ACUTE ONLY) 0912  Mobility Specialist Time Calculation (min) (ACUTE ONLY) 15 min   Pt was found on recliner chair and agreeable to ambulate. Had no complaints during ambulation and at EOS returned to recliner chair with all needs met. Call bell in reach.  Billey Chang Mobility Specialist

## 2023-04-09 NOTE — Plan of Care (Signed)
  Problem: Education: Goal: Knowledge of General Education information will improve Description: Including pain rating scale, medication(s)/side effects and non-pharmacologic comfort measures Outcome: Progressing   Problem: Activity: Goal: Risk for activity intolerance will decrease Outcome: Progressing   Problem: Coping: Goal: Level of anxiety will decrease Outcome: Progressing   Problem: Pain Managment: Goal: General experience of comfort will improve Outcome: Progressing   Problem: Safety: Goal: Ability to remain free from injury will improve Outcome: Progressing   Problem: Skin Integrity: Goal: Risk for impaired skin integrity will decrease Outcome: Progressing   Problem: Education: Goal: Knowledge of disease or condition will improve Outcome: Progressing   Problem: Activity: Goal: Ability to tolerate increased activity will improve Outcome: Progressing   Problem: Respiratory: Goal: Ability to maintain a clear airway will improve Outcome: Progressing

## 2023-04-10 DIAGNOSIS — S22000A Wedge compression fracture of unspecified thoracic vertebra, initial encounter for closed fracture: Secondary | ICD-10-CM | POA: Diagnosis not present

## 2023-04-10 DIAGNOSIS — J441 Chronic obstructive pulmonary disease with (acute) exacerbation: Secondary | ICD-10-CM | POA: Diagnosis not present

## 2023-04-10 MED ORDER — PREDNISONE 10 MG PO TABS: ORAL_TABLET | ORAL | 0 refills | Status: DC

## 2023-04-10 MED ORDER — GUAIFENESIN ER 600 MG PO TB12: 600.0000 mg | ORAL_TABLET | Freq: Two times a day (BID) | ORAL | 0 refills | Status: AC

## 2023-04-10 MED ORDER — VITAMIN D (ERGOCALCIFEROL) 1.25 MG (50000 UNIT) PO CAPS: 50000.0000 [IU] | ORAL_CAPSULE | ORAL | 0 refills | Status: AC

## 2023-04-10 NOTE — Plan of Care (Signed)
  Problem: Education: Goal: Knowledge of General Education information will improve Description: Including pain rating scale, medication(s)/side effects and non-pharmacologic comfort measures Outcome: Not Progressing   Problem: Health Behavior/Discharge Planning: Goal: Ability to manage health-related needs will improve Outcome: Not Progressing   

## 2023-04-10 NOTE — Care Management Important Message (Signed)
Important Message  Patient Details IM Letter given. Name: Lynn Kline MRN: [id number] Date of Birth: 08-02-1956   Medicare Important Message Given:  Yes     Caren Macadam 04/10/2023, 11:13 AM

## 2023-04-10 NOTE — TOC Transition Note (Signed)
Transition of Care [facility]) - CM/SW Discharge Note   Patient Details  Name: Lynn Kline MRN: [id number] Date of Birth: [birth year]  [facility] of [facility]) CM/SW Contact:  Larrie Kass, LCSW Phone Number: 04/10/2023, 1:39 PM   Clinical Narrative:    CSW spoke with pt regarding home O2 rec, pt has no preference in company. CSW sent referral to [employer]. Portable tank will be delivered to pt's room before d/c. No further TOC needs, TOC sign off.   Final next level of care: Home/Self Care Barriers to Discharge: No Barriers Identified   Patient Goals and CMS Choice CMS Medicare.gov Compare Post Acute Care list provided to:: Patient Choice offered to / list presented to : Patient  Discharge Placement                      Patient and family notified of of transfer: 04/10/23  Discharge Plan and Services Additional resources added to the After Visit Summary for                  DME Arranged: Oxygen DME Agency: [employer] Date DME Agency Contacted: 04/10/23 Time DME Agency Contacted: 1339 Representative spoke with at DME Agency: Marthann Schiller            Social Determinants of Health (SDOH) Interventions SDOH Screenings   Food Insecurity: No Food Insecurity (04/04/2023)  Housing: Low Risk  (04/04/2023)  Transportation Needs: No Transportation Needs (04/04/2023)  Utilities: Not At Risk (04/04/2023)  Alcohol Screen: Low Risk  (02/16/2023)  Depression (PHQ2-9): Low Risk  (02/16/2023)  Financial Resource Strain: Low Risk  (02/16/2023)  Physical Activity: Sufficiently Active (02/16/2023)  Social Connections: Unknown (02/16/2023)  Stress: No Stress Concern Present (02/16/2023)  Tobacco Use: Medium Risk (04/04/2023)     Readmission Risk Interventions    04/08/2023    3:06 PM  Readmission Risk Prevention Plan  Post Dischage Appt Complete  Medication Screening Complete  Transportation Screening Complete

## 2023-04-10 NOTE — Discharge Summary (Signed)
Physician Discharge Summary   Patient: Lynn Kline MRN: [id number] DOB: 1956/04/10  Admit date:     04/04/2023  Discharge date: 04/10/23  Discharge Physician: Meredeth Ide   PCP: Etta Grandchild, MD   Recommendations at discharge:   Patient to go home with home oxygen 2 L/min Follow-up PCP as outpatient for further workup for osteoporosis Take vitamin D 50,000 units oral every 7 days for 3 months  Discharge Diagnoses: Principal Problem:   COPD with acute exacerbation (HCC) Active Problems:   Compression fracture of body of thoracic vertebra (HCC)  Resolved Problems:   * No resolved hospital problems. *  Hospital Course:  67 year old female with past medical history of COPD, CAD, diverticulosis came to ED with shortness of breath. Patient says that she has hyperactive cough with progressive wheezing and weakness. She has 40 pack history of smoking. In the ED chest x-ray showed increasing compression of spinal level with hyperinflation chronic changes   Assessment and Plan:  COPD exacerbation -Acute hypoxemic respiratory failure -Patient was found to be hypoxemic with O2 sats in 80s, not on home oxygen -Started on prednisone 50 mg daily; prednisone was discontinued and patient started on Solu-Medrol 60 mg IV every 12 hours  -Significantly improved with doxycycline, Pulmicort, Mucinex, Mucomyst nebulizer -Will discharge on prednisone taper, she will require oxygen 2 L/min   Compression fracture of thoracic spine -MRI of the spine obtained showed compression fractures at T7 and T8; chronic -It does show near complete loss of vertebral body height centrally and 3 mm bony retropulsion is new since CT from 10/23 with marrow edema suggesting acute to subacute chronicity no significant spinal cord stenosis or cord compression -Seen on thoracic spine x-ray from 2019 -Orthopedics consulted, no intervention recommended as these fractures appear to be chronic.  Pain is well-controlled.   Medical management and PT recommended.  Patient should get osteoporosis workup as outpatient.  Her last DEXA scan was from 2019, will need a repeat DEXA scan as outpatient. -Ordered vitamin D level; came back low at 4.64 -Follow-up PCP as outpatient for further workup for osteoporosis   Vitamin D deficiency -Vitamin D, 25-hydroxy was significantly low at 4.64 -Started vitamin D 2 50,000 units weekly starting today at least for 3 months -Will need to check vitamin D level in 3 months   Generalized anxiety disorder -Continue Xanax         Consultants: Orthopedics Procedures performed:  Disposition: Home Diet recommendation:  Discharge Diet Orders (From admission, onward)     Start     Ordered   04/10/23 0000  Diet - low sodium heart healthy        04/10/23 1238           Regular diet DISCHARGE MEDICATION: Allergies as of 04/10/2023       Reactions   Codeine Nausea And Vomiting   Erythromycin Nausea And Vomiting   Ampicillin Nausea And Vomiting, Rash, Other (See Comments)   Pt states that rash was on her tongue.  Has patient had a PCN reaction causing immediate rash, facial/tongue/throat swelling, SOB or lightheadedness with hypotension yes Has patient had a PCN reaction causing severe rash involving mucus membranes or skin necrosis: no Has patient had a PCN reaction that required hospitalization yes Has patient had a PCN reaction occurring within the last 10 years: no If all of the above answers are "NO", then may proceed with Cephalosporin use.        Medication List  TAKE these medications    ALPRAZolam 0.25 MG tablet Commonly known as: XANAX 1 tab yb mouth twice per day as needed   Breztri Aerosphere 160-9-4.8 MCG/ACT Aero Generic drug: Budeson-Glycopyrrol-Formoterol Inhale 2 puffs into the lungs in the morning and at bedtime.   cyanocobalamin 2000 MCG tablet Take 1 tablet (2,000 mcg total) by mouth daily.   guaiFENesin 600 MG 12 hr  tablet Commonly known as: MUCINEX Take 1 tablet (600 mg total) by mouth 2 (two) times daily for 10 days.   predniSONE 10 MG tablet Commonly known as: DELTASONE Prednisone 40 mg po daily x 1 day then Prednisone 30 mg po daily x 1 day then Prednisone 20 mg po daily x 1 day then Prednisone 10 mg daily x 1 day then stop...   Ventolin HFA 108 (90 Base) MCG/ACT inhaler Generic drug: albuterol INHALE 1 TO 2 PUFFS INTO THE LUNGS EVERY 6 HOURS AS NEEDED FOR WHEEZING OR SHORTNESS OF BREATH What changed: See the new instructions.   albuterol (2.5 MG/3ML) 0.083% nebulizer solution Commonly known as: PROVENTIL USE 3 ML VIA NEBULIZER EVERY 4 HOURS AS NEEDED FOR WHEEZING OR SHORTNESS OF BREATH What changed: See the new instructions.   Vitamin D (Ergocalciferol) 1.25 MG (50000 UNIT) Caps capsule Commonly known as: DRISDOL Take 1 capsule (50,000 Units total) by mouth every 7 (seven) days. Start taking on: April 15, 2023               Durable Medical Equipment  (From admission, onward)           Start     Ordered   04/10/23 1218  For home use only DME oxygen  Once       Question Answer Comment  Length of Need Lifetime   Mode or (Route) Nasal cannula   Liters per Minute 2   Frequency Continuous (stationary and portable oxygen unit needed)   Oxygen conserving device Yes   Oxygen delivery system Gas      04/10/23 1217            Follow-up Information     Venita Lick, MD. Schedule an appointment as soon as possible for a visit in 4 week(s).   Specialty: Orthopedic Surgery Why: fracture follow up Contact information: [address] [place] [address] [phone]         Etta Grandchild, MD Follow up in 1 week(s).   Specialty: Internal Medicine Why: You will need work up for osteoporosis as outpatient Contact information: [address] [place] [address] [phone]                Discharge Exam: Ceasar Mons Weights   04/04/23 1543  04/04/23 2243  Weight: 62.1 kg 61.4 kg   General-appears in no acute distress Heart-S1-S2, regular, no murmur auscultated Lungs-clear to auscultation bilaterally, no wheezing or crackles auscultated Abdomen-soft, nontender, no organomegaly Extremities-no edema in the lower extremities Neuro-alert, oriented x3, no focal deficit noted  Condition at discharge: good  The results of significant diagnostics from this hospitalization (including imaging, microbiology, ancillary and laboratory) are listed below for reference.   Imaging Studies: MR THORACIC SPINE WO CONTRAST  Result Date: 04/05/2023 CLINICAL DATA:  Compression fracture. EXAM: MRI THORACIC SPINE WITHOUT CONTRAST TECHNIQUE: Multiplanar, multisequence MR imaging of the thoracic spine was performed. No intravenous contrast was administered. COMPARISON:  CT chest 08/06/2022, two-view chest radiograph 04/04/2023 FINDINGS: Alignment: There is exaggerated thoracic kyphosis centered at T8. There is grade 1 anterolisthesis of T4  on T5, T6 on T7, and T7 on T8, increased at T7-T8 compared to the CT from 2023. Vertebrae: There is compression deformity of the T7 vertebral body with an associated prominent Schmorl's node along the superior endplate resulting in up to approximately 50% loss of vertebral body height anteriorly. There is no bony retropulsion. This is unchanged compared to the CT chest from 2023. There is severe compression deformity of the T8 vertebral body with near-complete loss of vertebral body height centrally and mild bony retropulsion measuring approximately 3 mm which is new since the CT from 2023. There is no significant spinal canal stenosis or cord compression. There is mild marrow edema consistent with acute to subacute chronicity. There is no definite extension into the posterior elements. The other vertebral body heights are preserved. There is no other marrow edema. There is an intraosseous hemangioma in the T12 vertebral body.  There is no suspicious marrow signal abnormality. Cord:  Normal in signal and morphology. Paraspinal and other soft tissues: Unremarkable. Disc levels: The disc heights in the thoracic spine are overall preserved. There are small disc protrusions at T7-T8 through L2-L3 without significant spinal canal or neural foraminal stenosis. IMPRESSION: 1. Severe compression deformity of the T8 vertebral body with near-complete loss of vertebral body height centrally and 3 mm bony retropulsion is new since the CT from 08/06/2022, with marrow edema suggesting acute to subacute chronicity. No significant spinal canal stenosis or cord compression. 2. Compression deformity of the T7 vertebral body with approximately 50% loss of vertebral body height anteriorly is unchanged since the CT. 3. Mild degenerative changes without significant spinal canal or neural foraminal stenosis. Electronically Signed   By: Lesia Hausen M.D.   On: 04/05/2023 16:14   DG Chest 2 View  Result Date: 04/04/2023 CLINICAL DATA:  Shortness of breath EXAM: CHEST - 2 VIEW COMPARISON:  X-ray 12/05/2022 and older FINDINGS: Hyperinflation. Left apical pleural thickening and scarring. No consolidation or edema. Normal cardiopericardial silhouette. There is blunting of the inferior costophrenic angles. Pleural thickening is possible rather than effusion. Surgical clips overlie the left upper chest. There is kyphosis with compression of a midthoracic spine vertebral level which is progressive from the prior x-ray. Please correlate with clinical history. IMPRESSION: Hyperinflation with chronic changes. Increasing, now severe compression of the midthoracic spine level compared to previous. Now developing kyphosis. Please correlate for any known history and if needed additional spinal workup as clinically appropriate to exclude a marrow replacement process or neoplasm. Electronically Signed   By: Karen Kays M.D.   On: 04/04/2023 17:28    Microbiology: Results  for orders placed or performed during the hospital encounter of 11/03/20  Fungus Culture With Stain     Status: None   Collection Time: 11/03/20 10:29 AM   Specimen: Bronchial Alveolar Lavage; Respiratory  Result Value Ref Range Status   Fungus Stain Final report  Final   Fungus (Mycology) Culture Final report  Final    Comment: (NOTE) Performed At: [facility] [address] [place], [place] [id number] Jolene Schimke MD VF:[id number]    Fungal Source BRONCHIAL ALVEOLAR LAVAGE  Final    Comment: Performed at [facility], [address], [place], [place] [address]  Aerobic/Anaerobic Culture (surgical/deep wound)     Status: None   Collection Time: 11/03/20 10:29 AM   Specimen: Bronchial Alveolar Lavage; Respiratory  Result Value Ref Range Status   Specimen Description BRONCHIAL ALVEOLAR LAVAGE  Final   Special Requests BAL SPEC E  Final   Gram Stain NO WBC SEEN NO ORGANISMS SEEN   Final   Culture   Final    RARE PSEUDOMONAS AERUGINOSA FEW STREPTOCOCCUS MITIS/ORALIS NO ANAEROBES ISOLATED Performed at [facility], [address] [place], [place] [address]    Report Status 11/08/2020 FINAL  Final   Organism ID, Bacteria STREPTOCOCCUS MITIS/ORALIS  Final   Organism ID, Bacteria PSEUDOMONAS AERUGINOSA  Final      Susceptibility   Pseudomonas aeruginosa - MIC*    CEFTAZIDIME 2 SENSITIVE Sensitive     CIPROFLOXACIN <=0.25 SENSITIVE Sensitive     GENTAMICIN <=1 SENSITIVE Sensitive     IMIPENEM 2 SENSITIVE Sensitive     PIP/TAZO 8 SENSITIVE Sensitive     CEFEPIME 2 SENSITIVE Sensitive     * RARE PSEUDOMONAS AERUGINOSA   Streptococcus mitis/oralis - MIC*    TETRACYCLINE >=16 RESISTANT Resistant     VANCOMYCIN 0.25 SENSITIVE Sensitive     CLINDAMYCIN <=0.25 SENSITIVE Sensitive     PENICILLIN Value in next row Sensitive      SENSITIVE0.06    CEFTRIAXONE Value in next row Sensitive      SENSITIVE0.12    * FEW STREPTOCOCCUS MITIS/ORALIS  Acid Fast Culture  with reflexed sensitivities     Status: None   Collection Time: 11/03/20 10:29 AM   Specimen: Bronchial Alveolar Lavage; Respiratory  Result Value Ref Range Status   Acid Fast Culture Negative  Final    Comment: (NOTE) No acid fast bacilli isolated after 6 weeks. Performed At: [facility] [address] [place], [place] [id number] Jolene Schimke MD EX:[id number]    Source of Sample BRONCHIAL ALVEOLAR LAVAGE  Final    Comment: Performed at [facility], [address] [place], [place] [address]  Acid Fast Smear (AFB)     Status: None   Collection Time: 11/03/20 10:29 AM   Specimen: Bronchial Alveolar Lavage; Respiratory  Result Value Ref Range Status   AFB Specimen Processing Concentration  Final   Acid Fast Smear Negative  Final    Comment: (NOTE) Performed At: [facility] [address] [place], [place] [id number] Jolene Schimke MD VQ:[id number]    Source (AFB) BRONCHIAL ALVEOLAR LAVAGE  Final    Comment: Performed at [facility], [address], [place], [place] [address]  Fungus Culture Result     Status: None   Collection Time: 11/03/20 10:29 AM  Result Value Ref Range Status   Result 1 Comment  Final    Comment: (NOTE) KOH/Calcofluor preparation:  no fungus observed. Performed At: [facility] [address] [place], [place] [id number] Jolene Schimke MD YT:[id number]   Fungal organism reflex     Status: None   Collection Time: 11/03/20 10:29 AM  Result Value Ref Range Status   Fungal result 1 Comment  Final    Comment: (NOTE) No yeast or mold isolated after 4 weeks. Performed At: [facility] [address] [place], [place] [id number] Jolene Schimke MD [place]:[id number]     Labs: CBC: Recent Labs  Lab 04/04/23 1558 04/04/23 2144 04/05/23 0438  WBC 9.6 9.0 5.8  HGB 15.2* 14.6 13.6  HCT 47.6* 45.0 42.5  MCV 95.2 96.6 95.5  PLT 346 303 307   Basic Metabolic Panel: Recent Labs  Lab 04/04/23 1558  04/04/23 2144 04/05/23 0438  NA 138  --  136  K 4.3  --  4.4  CL 100  --  101  CO2 30  --  29  GLUCOSE 104*  --  143*  BUN 10  --  16  CREATININE 0.60 0.66 0.62  CALCIUM 9.2  --  8.7*   Liver Function Tests: No results for input(s): "AST", "ALT", "ALKPHOS", "BILITOT", "PROT", "ALBUMIN" in the last 168 hours. CBG: No results for input(s): "GLUCAP" in the last 168 hours.  Discharge time spent: greater than 30 minutes.  Signed: Meredeth Ide, MD [facility] 04/10/2023

## 2023-04-10 NOTE — Progress Notes (Signed)
SATURATION QUALIFICATIONS: (This note is used to comply with regulatory documentation for home oxygen)  Patient Saturations on Room Air at Rest = 92%  Patient Saturations on Room Air while Ambulating = 87%  Patient Saturations on 2 Liters of oxygen while Ambulating = 94%  Please briefly explain why patient needs home oxygen: Pt walked 150 ft without Oxygen. Requiring [facility] to recover to 94% [place]

## 2023-04-11 ENCOUNTER — Encounter: Payer: Self-pay | Admitting: *Deleted

## 2023-04-11 ENCOUNTER — Telehealth: Payer: Self-pay | Admitting: *Deleted

## 2023-04-11 NOTE — Transitions of Care (Post Inpatient/ED Visit) (Signed)
04/11/2023  Name: Lynn Kline MRN: [id number] DOB: 1956-02-26  Today's TOC FU Call Status: Today's TOC FU Call Status:: Successful TOC FU Call Competed TOC FU Call Complete Date: 04/11/23  Transition Care Management Follow-up Telephone Call Date of Discharge: 04/10/23 Discharge Facility: [facility]) Type of Discharge: Inpatient Admission Primary Inpatient Discharge Diagnosis:: COPD exacerbation/ thoracic compression fracture How have you been since you were released from the hospital?: Better ("I am really feeling better today; not having any problems at all.  I was just getting ready to head out and get my medicine from the pharmacy-- they are ready for pick up.  I have the home oxygen, but have not needed it yet; I am breathing fine so far") Any questions or concerns?: No  Items Reviewed: Did you receive and understand the discharge instructions provided?: Yes (thoroughly reviewed with patient who verbalizes good understanding of same) Medications obtained,verified, and reconciled?: Yes (Medications Reviewed) (Full medication reconciliation/ review completed; no concerns or discrepancies identified; confirmed patient obtained/ is taking all newly Rx'd medications as instructed; self-manages medications and denies questions/ concerns around medications today) Any new allergies since your discharge?: No Dietary orders reviewed?: Yes Type of Diet Ordered:: Heart healthy low salt, "as much as I can" Do you have support at home?: Yes People in Home: child(ren), adult Name of Support/Comfort Primary Source: Reports independent in self-care activities- still works; family assists as/ if needed/ indicated  Medications Reviewed Today: Medications Reviewed Today     Reviewed by Michaela Corner, RN (Registered Nurse) on 04/11/23 at 1627  Med List Status: <None>   Medication Order Taking? Sig Documenting Provider Last Dose Status Informant  albuterol (PROVENTIL) (2.5 MG/3ML) 0.083%  nebulizer solution [id number] Yes USE 3 ML VIA NEBULIZER EVERY 4 HOURS AS NEEDED FOR WHEEZING OR SHORTNESS OF BREATH  Patient taking differently: Take 2.5 mg by nebulization every 4 (four) hours as needed for shortness of breath or wheezing.   Etta Grandchild, MD Taking Active Self, Pharmacy Records  ALPRAZolam Prudy Feeler) 0.25 MG tablet [id number] Yes 1 tab yb mouth twice per day as needed Etta Grandchild, MD Taking Active Self, Pharmacy Records  Budeson-Glycopyrrol-Formoterol [facility] AEROSPHERE) 160-9-4.8 MCG/ACT Sandrea Matte [id number] Yes Inhale 2 puffs into the lungs in the morning and at bedtime. Josephine Igo, DO Taking Active Self, Pharmacy Records  cyanocobalamin 2000 MCG tablet [id number] No Take 1 tablet (2,000 mcg total) by mouth daily.  Patient not taking: Reported on 04/04/2023   Etta Grandchild, MD Not Taking Active Self, Pharmacy Records  guaiFENesin [facility]) 600 MG 12 hr tablet [id number] Yes Take 1 tablet (600 mg total) by mouth 2 (two) times daily for 10 days. Meredeth Ide, MD Taking Active            Med Note Michaela Corner   Tue Apr 11, 2023  4:26 PM) 04/11/23: reports during [facility] call is getting ready to go pick up from outpatient pharmacy now    predniSONE (DELTASONE) 10 MG tablet [id number] Yes Prednisone 40 mg po daily x 1 day then Prednisone 30 mg po daily x 1 day then Prednisone 20 mg po daily x 1 day then Prednisone 10 mg daily x 1 day then stop... Meredeth Ide, MD Taking Active            Med Note Michaela Corner   Tue Apr 11, 2023  4:24 PM) 04/11/23: reports during [facility] call is getting ready to go pick up from outpatient pharmacy  now  VENTOLIN HFA 108 (90 Base) MCG/ACT inhaler [id number] Yes INHALE 1 TO 2 PUFFS INTO THE LUNGS EVERY 6 HOURS AS NEEDED FOR WHEEZING OR SHORTNESS OF BREATH  Patient taking differently: Inhale 1-2 puffs into the lungs every 6 (six) hours as needed for wheezing.   Etta Grandchild, MD Taking Active Self, Pharmacy Records  Vitamin D, Ergocalciferol,  (DRISDOL) 1.25 MG (50000 UNIT) CAPS capsule [id number] Yes Take 1 capsule (50,000 Units total) by mouth every 7 (seven) days. Meredeth Ide, MD Taking Active            Med Note Michaela Corner   Tue Apr 11, 2023  4:26 PM) 04/11/23: reports during [facility] call is getting ready to go pick up from outpatient pharmacy now              Home Care and Equipment/Supplies: Were Home Health Services Ordered?: No Any new equipment or medical supplies ordered?: No  Functional Questionnaire: Do you need assistance with bathing/showering or dressing?: No Do you need assistance with meal preparation?: No Do you need assistance with eating?: No Do you have difficulty maintaining continence: No Do you need assistance with getting out of bed/getting out of a chair/moving?: No Do you have difficulty managing or taking your medications?: No  Follow up appointments reviewed: PCP Follow-up appointment confirmed?: Yes Date of PCP follow-up appointment?: 04/13/23 Follow-up Provider: PCP Specialist Hospital Follow-up appointment confirmed?: Yes Date of Specialist follow-up appointment?: 05/16/23 Follow-Up Specialty Provider:: pulmonary Do you need transportation to your follow-up appointment?: No Do you understand care options if your condition(s) worsen?: Yes-patient verbalized understanding  SDOH Interventions Today    Flowsheet Row Most Recent Value  SDOH Interventions   Food Insecurity Interventions Intervention Not Indicated  Transportation Interventions Intervention Not Indicated  [drives self]      TOC Interventions Today    Flowsheet Row Most Recent Value  TOC Interventions   TOC Interventions Discussed/Reviewed TOC Interventions Discussed  [Patient declines need for ongoing/ further care coordination outreach,  no care coordination needs identified at time of TOC call today,  provided my direct contact information should questions/ concerns/ needs arise post-TOC call]       Interventions Today    Flowsheet Row Most Recent Value  Chronic Disease   Chronic disease during today's visit Chronic Obstructive Pulmonary Disease (COPD)  General Interventions   General Interventions Discussed/Reviewed General Interventions Discussed, Doctor Visits  Doctor Visits Discussed/Reviewed Doctor Visits Discussed, Specialist, PCP  PCP/Specialist Visits Compliance with follow-up visit  Education Interventions   Education Provided Provided Education  Provided Verbal Education On Other  [safe use of home O2,  process to return home )2 if she ends up not needing long term]  Nutrition Interventions   Nutrition Discussed/Reviewed Nutrition Discussed  Pharmacy Interventions   Pharmacy Dicussed/Reviewed Pharmacy Topics Discussed  [Full medication review with updating medication list in EHR per patient report]  Safety Interventions   Safety Discussed/Reviewed Safety Discussed      Caryl Pina, RN, BSN, CCRN Alumnus RN CM Care Coordination/ Transition of Care- [facility] Care Management [phone]: direct office

## 2023-04-12 ENCOUNTER — Ambulatory Visit: Payer: Self-pay | Admitting: *Deleted

## 2023-04-12 NOTE — Chronic Care Management (AMB) (Signed)
   04/12/2023  [name] [birth year] [phone]  Patient is not participating in chronic care management (CCM) services, status changed to previously enrolled.  Lynn Kline [facility], BSN RN Case Manager [phone]

## 2023-04-13 ENCOUNTER — Encounter: Payer: Self-pay | Admitting: Internal Medicine

## 2023-04-13 ENCOUNTER — Ambulatory Visit (INDEPENDENT_AMBULATORY_CARE_PROVIDER_SITE_OTHER): Payer: Medicare Other | Admitting: Internal Medicine

## 2023-04-13 VITALS — BP 134/84 | HR 99 | Temp 98.3°F | Resp 16 | Ht 62.0 in | Wt 137.0 lb

## 2023-04-13 DIAGNOSIS — Z1231 Encounter for screening mammogram for malignant neoplasm of breast: Secondary | ICD-10-CM

## 2023-04-13 DIAGNOSIS — D692 Other nonthrombocytopenic purpura: Secondary | ICD-10-CM

## 2023-04-13 DIAGNOSIS — E785 Hyperlipidemia, unspecified: Secondary | ICD-10-CM | POA: Diagnosis not present

## 2023-04-13 DIAGNOSIS — K635 Polyp of colon: Secondary | ICD-10-CM

## 2023-04-13 DIAGNOSIS — E2839 Other primary ovarian failure: Secondary | ICD-10-CM

## 2023-04-13 DIAGNOSIS — E538 Deficiency of other specified B group vitamins: Secondary | ICD-10-CM | POA: Diagnosis not present

## 2023-04-13 DIAGNOSIS — R7303 Prediabetes: Secondary | ICD-10-CM | POA: Insufficient documentation

## 2023-04-13 DIAGNOSIS — J4489 Other specified chronic obstructive pulmonary disease: Secondary | ICD-10-CM

## 2023-04-13 DIAGNOSIS — F411 Generalized anxiety disorder: Secondary | ICD-10-CM | POA: Insufficient documentation

## 2023-04-13 DIAGNOSIS — J439 Emphysema, unspecified: Secondary | ICD-10-CM

## 2023-04-13 DIAGNOSIS — E559 Vitamin D deficiency, unspecified: Secondary | ICD-10-CM

## 2023-04-13 DIAGNOSIS — S22000A Wedge compression fracture of unspecified thoracic vertebra, initial encounter for closed fracture: Secondary | ICD-10-CM

## 2023-04-13 MED ORDER — ALPRAZOLAM 0.25 MG PO TABS
ORAL_TABLET | ORAL | 3 refills | Status: DC
Start: 2023-04-13 — End: 2023-08-10

## 2023-04-13 MED ORDER — BREZTRI AEROSPHERE 160-9-4.8 MCG/ACT IN AERO
2.0000 | INHALATION_SPRAY | Freq: Two times a day (BID) | RESPIRATORY_TRACT | 1 refills | Status: DC
Start: 2023-04-13 — End: 2023-06-21

## 2023-04-13 MED ORDER — CYANOCOBALAMIN 2000 MCG PO TABS
2000.0000 ug | ORAL_TABLET | Freq: Every day | ORAL | 1 refills | Status: DC
Start: 2023-04-13 — End: 2024-01-29

## 2023-04-13 NOTE — Progress Notes (Addendum)
Subjective:  Patient ID: Lynn Kline, female    DOB: [birth year]  Age: 67 y.o. MRN: [id number]  CC: COPD   HPI Lynn Kline presents for f/up  ---  Discussed the use of AI scribe software for clinical note transcription with the patient, who gave verbal consent to proceed.  History of Present Illness   The patient, with a recent hospitalization for COPD exacerbation, reports a significant improvement in her condition. She initially experienced a sudden drop in oxygen levels, leading to difficulty breathing and necessitating hospital admission. The patient attributes this episode to inadequate self-care of her COPD.  Post-hospitalization, the patient reports residual coughing and mucus production, which she is managing with breathing treatments, a flutter valve, and Mucinex twice daily. The mucus is described as white and scant. She denies any fever, chills, or hemoptysis.  The patient also mentions being on a tapering course of prednisone, without any reported side effects such as weakness, dizziness, or lightheadedness. She denies any difficulty with swallowing. She notes some weight loss associated with this illness episode.  The patient continues to use Breztri daily for her respiratory condition, which she reports as beneficial.        Message Received: 3 days ago [place], Sarina Ill, MD  Etta Grandchild, MD      Attached Notes  Discharge Summary by Meredeth Ide, MD at 04/10/2023 12:37 PM  Author: Meredeth Ide, MD Service: Hospitalist Author Type: Physician  Filed: 04/10/2023 12:42 PM Date of Service: 04/10/2023 12:37 PM Note Type: Discharge Summary  Status: Signed Editor: Meredeth Ide, MD (Physician)  Expand All Collapse All     Physician Discharge Summary    Patient: Lynn Kline MRN: [id number] DOB: 19-Sep-1956  Admit date:     04/04/2023  Discharge date: 04/10/23  Discharge Physician: Meredeth Ide    PCP: Etta Grandchild, MD    Recommendations at  discharge:    Patient to go home with home oxygen 2 L/min Follow-up PCP as outpatient for further workup for osteoporosis Take vitamin D 50,000 units oral every 7 days for 3 months   Discharge Diagnoses: Principal Problem:   COPD with acute exacerbation (HCC) Active Problems:   Compression fracture of body of thoracic vertebra (HCC)   Resolved Problems:   * No resolved hospital problems. *   Hospital Course:   67 year old female with past medical history of COPD, CAD, diverticulosis came to ED with shortness of breath. Patient says that she has hyperactive cough with progressive wheezing and weakness. She has 40 pack history of smoking. In the ED chest x-ray showed increasing compression of spinal level with hyperinflation chronic changes    Assessment and Plan:   COPD exacerbation -Acute hypoxemic respiratory failure -Patient was found to be hypoxemic with O2 sats in 80s, not on home oxygen -Started on prednisone 50 mg daily; prednisone was discontinued and patient started on Solu-Medrol 60 mg IV every 12 hours  -Significantly improved with doxycycline, Pulmicort, Mucinex, Mucomyst nebulizer -Will discharge on prednisone taper, she will require oxygen 2 L/min   Compression fracture of thoracic spine -MRI of the spine obtained showed compression fractures at T7 and T8; chronic -It does show near complete loss of vertebral body height centrally and 3 mm bony retropulsion is new since CT from 10/23 with marrow edema suggesting acute to subacute chronicity no significant spinal cord stenosis or cord compression -Seen on thoracic spine x-ray from 2019 -Orthopedics consulted, no intervention  recommended as these fractures appear to be chronic.  Pain is well-controlled.  Medical management and PT recommended.  Patient should get osteoporosis workup as outpatient.  Her last DEXA scan was from 2019, will need a repeat DEXA scan as outpatient. -Ordered vitamin D level; came back low at  4.64 -Follow-up PCP as outpatient for further workup for osteoporosis   Vitamin D deficiency -Vitamin D, 25-hydroxy was significantly low at 4.64 -Started vitamin D 2 50,000 units weekly starting today at least for 3 months -Will need to check vitamin D level in 3 months   Generalized anxiety disorder        Outpatient Medications Prior to Visit  Medication Sig Dispense Refill   albuterol (PROVENTIL) (2.5 MG/3ML) 0.083% nebulizer solution USE 3 ML VIA NEBULIZER EVERY 4 HOURS AS NEEDED FOR WHEEZING OR SHORTNESS OF BREATH (Patient taking differently: Take 2.5 mg by nebulization every 4 (four) hours as needed for shortness of breath or wheezing.) 75 mL 5   guaiFENesin (MUCINEX) 600 MG 12 hr tablet Take 1 tablet (600 mg total) by mouth 2 (two) times daily for 10 days. 20 tablet 0   predniSONE (DELTASONE) 10 MG tablet Prednisone 40 mg po daily x 1 day then Prednisone 30 mg po daily x 1 day then Prednisone 20 mg po daily x 1 day then Prednisone 10 mg daily x 1 day then stop... 10 tablet 0   VENTOLIN HFA 108 (90 Base) MCG/ACT inhaler INHALE 1 TO 2 PUFFS INTO THE LUNGS EVERY 6 HOURS AS NEEDED FOR WHEEZING OR SHORTNESS OF BREATH (Patient taking differently: Inhale 1-2 puffs into the lungs every 6 (six) hours as needed for wheezing.) 18 g 3   [START ON 04/15/2023] Vitamin D, Ergocalciferol, (DRISDOL) 1.25 MG (50000 UNIT) CAPS capsule Take 1 capsule (50,000 Units total) by mouth every 7 (seven) days. 12 capsule 0   ALPRAZolam (XANAX) 0.25 MG tablet 1 tab yb mouth twice per day as needed 60 tablet 3   Budeson-Glycopyrrol-Formoterol (BREZTRI AEROSPHERE) 160-9-4.8 MCG/ACT AERO Inhale 2 puffs into the lungs in the morning and at bedtime. 32.1 g 3   cyanocobalamin 2000 MCG tablet Take 1 tablet (2,000 mcg total) by mouth daily. 90 tablet 1   No facility-administered medications prior to visit.    ROS Review of Systems  Constitutional:  Negative for chills, diaphoresis, fatigue and fever.  HENT: Negative.     Eyes: Negative.   Respiratory:  Positive for cough, shortness of breath and wheezing. Negative for chest tightness.   Cardiovascular:  Negative for chest pain, palpitations and leg swelling.  Gastrointestinal:  Negative for abdominal pain, diarrhea, nausea and vomiting.  Endocrine: Negative.   Genitourinary: Negative.  Negative for difficulty urinating.  Musculoskeletal: Negative.   Skin: Negative.   Neurological:  Negative for dizziness and weakness.  Hematological:  Negative for adenopathy. Bruises/bleeds easily.  Psychiatric/Behavioral:  Negative for confusion, decreased concentration, dysphoric mood, hallucinations, sleep disturbance and suicidal ideas. The patient is nervous/anxious.     Objective:  BP 134/84 (BP Location: Left Arm, Patient Position: Sitting, Cuff Size: Large)   Pulse 99   Temp 98.3 F (36.8 C) (Oral)   Resp 16   Ht 5\' 2"  (1.575 m)   Wt 137 lb (62.1 kg)   SpO2 91%   BMI 25.06 kg/m   BP Readings from Last 3 Encounters:  04/13/23 134/84  04/10/23 137/82  02/13/23 130/80    Wt Readings from Last 3 Encounters:  04/13/23 137 lb (62.1 kg)  04/04/23 135  lb 4.8 oz (61.4 kg)  02/16/23 142 lb 10.2 oz (64.7 kg)    Physical Exam Vitals reviewed.  Constitutional:      General: She is not in acute distress.    Appearance: She is ill-appearing. She is not toxic-appearing or diaphoretic.  HENT:     Mouth/Throat:     Mouth: Mucous membranes are moist.  Eyes:     General: No scleral icterus.    Conjunctiva/sclera: Conjunctivae normal.  Cardiovascular:     Rate and Rhythm: Normal rate and regular rhythm.     Heart sounds: Murmur heard.     No friction rub. No gallop.  Pulmonary:     Effort: Pulmonary effort is normal.     Breath sounds: Examination of the right-upper field reveals decreased breath sounds. Examination of the left-upper field reveals decreased breath sounds. Examination of the right-middle field reveals decreased breath sounds. Examination  of the left-middle field reveals decreased breath sounds. Examination of the right-lower field reveals decreased breath sounds. Examination of the left-lower field reveals decreased breath sounds. Decreased breath sounds present. No wheezing, rhonchi or rales.  Abdominal:     General: Abdomen is flat.     Palpations: There is no mass.     Tenderness: There is no abdominal tenderness. There is no guarding.     Hernia: No hernia is present.  Musculoskeletal:        General: Normal range of motion.     Cervical back: Neck supple.     Right lower leg: No edema.     Left lower leg: No edema.  Lymphadenopathy:     Cervical: No cervical adenopathy.  Skin:    Findings: Ecchymosis present. No bruising, petechiae or rash.  Neurological:     General: No focal deficit present.  Psychiatric:        Mood and Affect: Mood is anxious.        Behavior: Behavior normal.        Thought Content: Thought content normal.        Cognition and Memory: Cognition normal.        Judgment: Judgment normal.     Lab Results  Component Value Date   WBC 5.8 04/05/2023   HGB 13.6 04/05/2023   HCT 42.5 04/05/2023   PLT 307 04/05/2023   GLUCOSE 143 (H) 04/05/2023   CHOL 224 (H) 10/25/2022   TRIG 96.0 10/25/2022   HDL 71.40 10/25/2022   LDLDIRECT 141.0 11/15/2011   LDLCALC 133 (H) 10/25/2022   ALT 19 08/18/2022   AST 20 08/18/2022   NA 136 04/05/2023   K 4.4 04/05/2023   CL 101 04/05/2023   CREATININE 0.62 04/05/2023   BUN 16 04/05/2023   CO2 29 04/05/2023   TSH 0.49 12/22/2021   INR 0.8 04/14/2023   HGBA1C 6.0 04/14/2023    MR THORACIC SPINE WO CONTRAST  Result Date: 04/05/2023 CLINICAL DATA:  Compression fracture. EXAM: MRI THORACIC SPINE WITHOUT CONTRAST TECHNIQUE: Multiplanar, multisequence MR imaging of the thoracic spine was performed. No intravenous contrast was administered. COMPARISON:  CT chest 08/06/2022, two-view chest radiograph 04/04/2023 FINDINGS: Alignment: There is exaggerated  thoracic kyphosis centered at T8. There is grade 1 anterolisthesis of T4 on T5, T6 on T7, and T7 on T8, increased at T7-T8 compared to the CT from 2023. Vertebrae: There is compression deformity of the T7 vertebral body with an associated prominent Schmorl's node along the superior endplate resulting in up to approximately 50% loss of vertebral body height anteriorly.  There is no bony retropulsion. This is unchanged compared to the CT chest from 2023. There is severe compression deformity of the T8 vertebral body with near-complete loss of vertebral body height centrally and mild bony retropulsion measuring approximately 3 mm which is new since the CT from 2023. There is no significant spinal canal stenosis or cord compression. There is mild marrow edema consistent with acute to subacute chronicity. There is no definite extension into the posterior elements. The other vertebral body heights are preserved. There is no other marrow edema. There is an intraosseous hemangioma in the T12 vertebral body. There is no suspicious marrow signal abnormality. Cord:  Normal in signal and morphology. Paraspinal and other soft tissues: Unremarkable. Disc levels: The disc heights in the thoracic spine are overall preserved. There are small disc protrusions at T7-T8 through L2-L3 without significant spinal canal or neural foraminal stenosis. IMPRESSION: 1. Severe compression deformity of the T8 vertebral body with near-complete loss of vertebral body height centrally and 3 mm bony retropulsion is new since the CT from 08/06/2022, with marrow edema suggesting acute to subacute chronicity. No significant spinal canal stenosis or cord compression. 2. Compression deformity of the T7 vertebral body with approximately 50% loss of vertebral body height anteriorly is unchanged since the CT. 3. Mild degenerative changes without significant spinal canal or neural foraminal stenosis. Electronically Signed   By: Lesia Hausen M.D.   On:  04/05/2023 16:14   DG Chest 2 View  Result Date: 04/04/2023 CLINICAL DATA:  Shortness of breath EXAM: CHEST - 2 VIEW COMPARISON:  X-ray 12/05/2022 and older FINDINGS: Hyperinflation. Left apical pleural thickening and scarring. No consolidation or edema. Normal cardiopericardial silhouette. There is blunting of the inferior costophrenic angles. Pleural thickening is possible rather than effusion. Surgical clips overlie the left upper chest. There is kyphosis with compression of a midthoracic spine vertebral level which is progressive from the prior x-ray. Please correlate with clinical history. IMPRESSION: Hyperinflation with chronic changes. Increasing, now severe compression of the midthoracic spine level compared to previous. Now developing kyphosis. Please correlate for any known history and if needed additional spinal workup as clinically appropriate to exclude a marrow replacement process or neoplasm. Electronically Signed   By: Karen Kays M.D.   On: 04/04/2023 17:28    Assessment & Plan:  B12 deficiency -     Cyanocobalamin; Take 1 tablet (2,000 mcg total) by mouth daily.  Dispense: 90 tablet; Refill: 1  Hyperlipidemia with target LDL less than 130- LDL goal achieved. Doing well on the statin  -     TSH; Future  Polyp of colon, unspecified part of colon, unspecified type -     Ambulatory referral to Gastroenterology  Visit for screening mammogram -     Digital Screening Mammogram, Left and Right; Future  Prediabetes -     Hemoglobin A1c; Future  Senile purpura (HCC) -     Protime-INR; Future  Estrogen deficiency -     DG Bone Density; Future  Vitamin D deficiency -     DG Bone Density; Future  Compression fracture of body of thoracic vertebra (HCC) -     DG Bone Density; Future  COPD with chronic bronchitis and emphysema (HCC)- Will continue the triple inhaler. -     Breztri Aerosphere; Inhale 2 puffs into the lungs in the morning and at bedtime.  Dispense: 32.1 g;  Refill: 1  GAD (generalized anxiety disorder) -     ALPRAZolam; 1 tab yb mouth twice per  day as needed  Dispense: 60 tablet; Refill: 3     Follow-up: No follow-ups on file.  [name], MD

## 2023-04-14 ENCOUNTER — Telehealth: Payer: Self-pay | Admitting: Internal Medicine

## 2023-04-14 LAB — PROTIME-INR
INR: 0.8 ratio (ref 0.8–1.0)
Prothrombin Time: 9.1 s — ABNORMAL LOW (ref 9.6–13.1)

## 2023-04-14 LAB — TSH: TSH: 0.59 u[IU]/mL (ref 0.35–5.50)

## 2023-04-14 LAB — HEMOGLOBIN A1C: Hgb A1c MFr Bld: 6 % (ref 4.6–6.5)

## 2023-04-14 NOTE — Telephone Encounter (Signed)
Patient needs a doctor to send in an order saying to cancel patient's oxygen.  Order must be sent to [facility]

## 2023-04-14 NOTE — Telephone Encounter (Signed)
Pt has been informed and expressed understanding. She will continue wearing the O2.

## 2023-04-17 ENCOUNTER — Telehealth: Payer: Self-pay | Admitting: *Deleted

## 2023-04-17 NOTE — Progress Notes (Signed)
  Care Coordination   Note   04/17/2023 Name: Lynn Kline MRN: [id number] DOB: 1956/01/20  Lynn Kline is a 67 y.o. year old female who sees Etta Grandchild, MD for primary care. I reached out to [name] by phone today to offer care coordination services.  Lynn Kline was given information about Care Coordination services today including:   The Care Coordination services include support from the care team which includes your Nurse Coordinator, Clinical Social Worker, or Pharmacist.  The Care Coordination team is here to help remove barriers to the health concerns and goals most important to you. Care Coordination services are voluntary, and the patient may decline or stop services at any time by request to their care team member.   Care Coordination Consent Status: Patient did not agree to participate in care coordination services at this time.  Follow up plan:  pt appreciative but declines services at this time   Encounter Outcome:  Pt. Refused  Burman Nieves, CCMA Care Coordination Care Guide Direct Dial: [phone]

## 2023-04-18 DIAGNOSIS — J441 Chronic obstructive pulmonary disease with (acute) exacerbation: Secondary | ICD-10-CM | POA: Diagnosis not present

## 2023-05-04 ENCOUNTER — Ambulatory Visit: Payer: Medicare Other | Admitting: Nurse Practitioner

## 2023-05-04 ENCOUNTER — Encounter: Payer: Self-pay | Admitting: Nurse Practitioner

## 2023-05-04 ENCOUNTER — Ambulatory Visit (INDEPENDENT_AMBULATORY_CARE_PROVIDER_SITE_OTHER): Payer: Medicare Other

## 2023-05-04 VITALS — BP 134/80 | HR 105 | Temp 98.7°F | Ht 62.0 in | Wt 136.6 lb

## 2023-05-04 DIAGNOSIS — R0602 Shortness of breath: Secondary | ICD-10-CM | POA: Diagnosis not present

## 2023-05-04 DIAGNOSIS — R911 Solitary pulmonary nodule: Secondary | ICD-10-CM | POA: Diagnosis not present

## 2023-05-04 DIAGNOSIS — J9611 Chronic respiratory failure with hypoxia: Secondary | ICD-10-CM | POA: Insufficient documentation

## 2023-05-04 DIAGNOSIS — J441 Chronic obstructive pulmonary disease with (acute) exacerbation: Secondary | ICD-10-CM

## 2023-05-04 DIAGNOSIS — R600 Localized edema: Secondary | ICD-10-CM | POA: Diagnosis not present

## 2023-05-04 DIAGNOSIS — R0609 Other forms of dyspnea: Secondary | ICD-10-CM

## 2023-05-04 LAB — BRAIN NATRIURETIC PEPTIDE: Pro B Natriuretic peptide (BNP): 62 pg/mL (ref 0.0–100.0)

## 2023-05-04 LAB — BASIC METABOLIC PANEL
BUN: 14 mg/dL (ref 6–23)
CO2: 31 mEq/L (ref 19–32)
Calcium: 9.2 mg/dL (ref 8.4–10.5)
Chloride: 100 mEq/L (ref 96–112)
Creatinine, Ser: 0.67 mg/dL (ref 0.40–1.20)
GFR: 90.09 mL/min (ref >60.00)
Glucose, Bld: 102 mg/dL — ABNORMAL HIGH (ref 70–99)
Potassium: 4.4 mEq/L (ref 3.5–5.1)
Sodium: 138 mEq/L (ref 135–145)

## 2023-05-04 LAB — CBC WITH DIFFERENTIAL/PLATELET
Basophils Absolute: 0.1 10*3/uL (ref 0.0–0.1)
Basophils Relative: 1 % (ref 0.0–3.0)
Eosinophils Absolute: 1.1 10*3/uL — ABNORMAL HIGH (ref 0.0–0.7)
Eosinophils Relative: 12.9 % — ABNORMAL HIGH (ref 0.0–5.0)
HCT: 38.8 % (ref 36.0–46.0)
Hemoglobin: 12.7 g/dL (ref 12.0–15.0)
Lymphocytes Relative: 20.8 % (ref 12.0–46.0)
Lymphs Abs: 1.8 10*3/uL (ref 0.7–4.0)
MCHC: 32.8 g/dL (ref 30.0–36.0)
MCV: 91.7 fl (ref 78.0–100.0)
Monocytes Absolute: 0.9 10*3/uL (ref 0.1–1.0)
Monocytes Relative: 9.9 % (ref 3.0–12.0)
Neutro Abs: 4.9 10*3/uL (ref 1.4–7.7)
Neutrophils Relative %: 55.4 % (ref 43.0–77.0)
Platelets: 740 10*3/uL — ABNORMAL HIGH (ref 150.0–400.0)
RBC: 4.23 Mil/uL (ref 3.87–5.11)
RDW: 13.7 % (ref 11.5–15.5)
WBC: 8.8 10*3/uL (ref 4.0–10.5)

## 2023-05-04 NOTE — Assessment & Plan Note (Signed)
New O2 requirement as of June 2024. She has not been using oxygen at home. Educated on importance of correcting hypoxia and utilizing supplemental O2. Qualified for POC 5 lpm. Will obtain ONO on continuous 2 lpm. Goal >88-90%

## 2023-05-04 NOTE — Patient Instructions (Addendum)
Continue Breztri 2 puffs Twice daily. Brush tongue and rinse mouth afterwards Continue Albuterol inhaler 2 puffs or 3 mL neb every 6 hours as needed for shortness of breath or wheezing. Notify if symptoms persist despite rescue inhaler/neb use. Use nebs at least twice a day before your [place]. Follow with flutter valve ten times  Restart supplemental oxygen 5 lpm with POC and 2 lpm at night. You need to be wearing oxygen at all times until you're seen back. Goal >88-90%. We will obtain overnight oxygen study to ensure this corrects your oxygen levels at night   -Mucinex [phone] mg Twice daily for chest congestion  -Prednisone taper. 4 tabs for 3 days, then 3 tabs for 3 days, 2 tabs for 3 days, then 1 tab for 3 days, then stop. Take in AM with food.    I will likely prescribe you lasix for a few days after I review your labs to see if this helps with your leg swelling/breathing.   Labs and chest x ray today   Follow up in 2-4 weeks with Dr. Tonia Brooms or Florentina Addison Velna Hedgecock,NP. If symptoms do not improve or worsen, please contact office for sooner follow up or seek emergency care.

## 2023-05-04 NOTE — Assessment & Plan Note (Signed)
Stable on previous imaging. Repeat LDCT chest 07/2023.

## 2023-05-04 NOTE — Assessment & Plan Note (Addendum)
Unresolved AECOPD. Prone to recurrent exacerbations. Failed therapy with azithromycin and daliresp. Does not want to consider injectable medications. She was 88% on room air at rest today. She required 5 lpm supplemental O2 on POC to maintain saturations >88-90%. She has mild bronchospasm and decreased airflow on exam. She also has BLE edema, which she says has been baseline for the last few months. Prednisone taper. Mucociliary clearance therapies. CXR to assess for pulm edema vs superimposed infection. Labs today. Question component of PH? May consider short course of lasix and echo. Action plan in place. Strict returned precautions. If no improvement, she will need CT imaging of the chest.   Patient Instructions  Continue Breztri 2 puffs Twice daily. Brush tongue and rinse mouth afterwards Continue Albuterol inhaler 2 puffs or 3 mL neb every 6 hours as needed for shortness of breath or wheezing. Notify if symptoms persist despite rescue inhaler/neb use. Use nebs at least twice a day before your [place]. Follow with flutter valve ten times  Restart supplemental oxygen 5 lpm with POC and 2 lpm at night. You need to be wearing oxygen at all times until you're seen back. Goal >88-90%. We will obtain overnight oxygen study to ensure this corrects your oxygen levels at night   -Mucinex [phone] mg Twice daily for chest congestion  -Prednisone taper. 4 tabs for 3 days, then 3 tabs for 3 days, 2 tabs for 3 days, then 1 tab for 3 days, then stop. Take in AM with food.    I will likely prescribe you lasix for a few days after I review your labs to see if this helps with your leg swelling/breathing.   Labs and chest x ray today   Follow up in 2-4 weeks with Dr. Tonia Brooms or Florentina Addison Brittanie Dosanjh,NP. If symptoms do not improve or worsen, please contact office for sooner follow up or seek emergency care.

## 2023-05-04 NOTE — Progress Notes (Signed)
@Patient  ID: [name], female    DOB: 01-Jul-1956, 67 y.o.   MRN: [id number]  Chief Complaint  Patient presents with   Follow-up    Breathing is "okay".  She has been working from home and has not been very active. She has occ wheezing and has been using albuterol inhaler and albuterol neb at least twice daily.     Referring provider: Etta Grandchild, MD  HPI: 67 year old female, former smoker followed for severe COPD and lung nodule. She had an abnormal lung cancer screening CT in 2021 followed by bronchoscopy with non-diagnostic nodules and negative for malignancy. She is a patient of Dr. Myrlene Broker and last seen in office 02/13/2023. Past medical history significant for CAD, IBS, anxiety, HLD.  TEST/EVENTS:  10/24/2018 PFT: FVC 74, FEV1 51, ratio 50, TLC 130, DLCOunc 73 08/06/2022 CT chest: there is a trace pericardial effusion. Emphysematous changes with apical pleural scarring. Bronchial wall thickening is present bilaterally. Stable scarring and metallic markers of the LUL. There is a 3 mm nodule in RLL, unchanged.  04/04/2023 CXR: hyperinflation. Left apical pleural thickening and scarring. Blunting of the inferior costophrenic angles. Pleural thickening is possible vs effusion. Kyphosis with compression fx of midthoracic spine, progressive since previous   07/20/2022: OV with Dr. Tonia Brooms. Multiple recent exacerbations requiring steroids and abx. Using breztri twice daily. Just completed another course of prednisone. She has cough, sputum production, chest tightness, wheezing. Started on azithromycin M/W/F. Plan to repeat ECG in 4 weeks. Repeat CT chest for evaluation of nodules ordered  08/18/2022: OV with Juventino Pavone NP for follow up. She has been doing better since she was here last; still with daily productive cough and chest congestion. She feels like the azithromycin makes her feel bad and causes her heart to race. She's noticed that since she started it, it feels like her heart pounds every  time she gets up to move. She doesn't want to continue on it right now. She denies any lightheadedness, dizziness or chest pain. Her breathing is stable. No increased wheezing. She is using Breztri twice daily. D/c azithromycin. Started her on daliresp.   09/15/2022: OV with Kazuko Clemence NP for follow up after being started on daliresp. She had trouble tolerating the daliresp due to loose stools and upset stomach, even with the 250 mcg dose. She has since stopped it. Breathing has been stable for the last few weeks. She did purchase a portable neb machine, which she feels like has helped because she can use it while she's at work. 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 She does take frequent breaks outside and her boss has started working on cleaning out the supply room, so she hopes this will help. No increased cough or chest congestion. She continues on [place] twice daily. Using her nebs 1-3 times a day.   11/03/2022: OV with Dr. Tonia Brooms. Did not tolerate the azithromycin. Unsure if this was related to medication or ongoing exacerbations of COPD. Trouble with breathing chemical smells and dust at work. 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 Only on nebulized treatments and her inhalers. Unable to tolerate daliresp in the past due to diarrhea. Recommend trying azithromycin only M/W/F.   02/13/2023: OV with Dr. Tonia Brooms. Did not tolerate azithromycin M/W/F. Discussed evaluation for asthma/COPD overlap since she does have elevated eos previously. Would consider ordering repeat lab work, RAST, and IgE; however, she does  not want to use any injection meds. Does not make sense to do further workup if she is not willing to try anything else. Recommend daily antihistamine.   05/04/2023: Today - follow up Patient presents today for hospital follow up. She was admitted 04/04/2023-04/10/2023 for acute respiratory failure secondary to AECOPD.  She was treated with steroids, doxycyline, and bronchodilators. She was also found to have compression fracture of T7 and T8 which ortho deemed chronic. She was advised to have DEXA scan as an outpatient. She was discharged on prednisone taper, supplemental O2 2 lpm, and vitamin D. She tells me today that she is feeling better. Still has a cough and chest congestion. Not producing much phlegm but when she gets something up, it's white in color. Notices some wheezing but improved compared to when she was in the hospital. Breathing is feeling closer to her baseline but she is having to rest more. She's not wearing her oxygen. She's been checking her levels at home and says they have been 93-94% at rest and will drop lower with activity but recovers quickly with rest again. She does have some swelling in her ankles/feet that she's noticed for the past few months. She denies orthopnea, PND, fevers, chills, hemoptysis, calf pain, CP. She is using her nebs every 6 hours, which is what they were doing in the hospital.   Allergies  Allergen Reactions   Codeine Nausea And Vomiting   Erythromycin Nausea And Vomiting   Ampicillin Nausea And Vomiting, Rash and Other (See Comments)    Pt states that rash was on her tongue.  Has patient had a PCN reaction causing immediate rash, facial/tongue/throat swelling, SOB or lightheadedness with hypotension yes Has patient had a PCN reaction causing severe rash involving mucus membranes or skin necrosis: no Has patient had a PCN reaction that required hospitalization yes Has patient had a PCN reaction occurring within the last 10 years: no If all of the above answers are "NO", then may proceed with Cephalosporin use.     Immunization History  Administered Date(s) Administered   DTaP 06/14/2005   Fluad Quad(high Dose 65+) 07/04/2020, 07/14/2021   Influenza Whole 10/23/2011   Influenza,inj,Quad PF,6+ Mos 08/19/2014, 10/24/2018, 08/01/2019   PFIZER(Purple  Top)SARS-COV-2 Vaccination 07/04/2020, 07/25/2020   Pneumococcal Conjugate-13 09/14/2005   Pneumococcal Polysaccharide-23 08/19/2014, 12/22/2021   Tdap 09/22/2013    Past Medical History:  Diagnosis Date   Anxiety    Atherosclerosis    Colon polyp, hyperplastic    COPD (chronic obstructive pulmonary disease) (HCC)    Diverticulosis    Gallstone    Pneumonia     Tobacco History: Social History   Tobacco Use  Smoking Status Former   Current packs/day: 0.00   Types: Cigarettes   Quit date: 12/15/2021   Years since quitting: 1.3  Smokeless Tobacco Never  Tobacco Comments   Pt states she quit smoking in March 2023. 07/21/2022 Tay   Counseling given: Not Answered Tobacco comments: Pt states she quit smoking in March 2023. 07/21/2022 Tay   Outpatient Medications Prior to Visit  Medication Sig Dispense Refill   albuterol (PROVENTIL) (2.5 MG/3ML) 0.083% nebulizer solution USE 3 ML VIA NEBULIZER EVERY 4 HOURS AS NEEDED FOR WHEEZING OR SHORTNESS OF BREATH (Patient taking differently: Take 2.5 mg by nebulization every 4 (four) hours as needed for shortness of breath or wheezing.) 75 mL 5   ALPRAZolam (XANAX) 0.25 MG tablet 1 tab yb mouth twice per day as needed 60 tablet 3   Budeson-Glycopyrrol-Formoterol (BREZTRI  AEROSPHERE) 160-9-4.8 MCG/ACT AERO Inhale 2 puffs into the lungs in the morning and at bedtime. 32.1 g 1   VENTOLIN HFA 108 (90 Base) MCG/ACT inhaler INHALE 1 TO 2 PUFFS INTO THE LUNGS EVERY 6 HOURS AS NEEDED FOR WHEEZING OR SHORTNESS OF BREATH (Patient taking differently: Inhale 1-2 puffs into the lungs every 6 (six) hours as needed for wheezing.) 18 g 3   Vitamin D, Ergocalciferol, (DRISDOL) 1.25 MG (50000 UNIT) CAPS capsule Take 1 capsule (50,000 Units total) by mouth every 7 (seven) days. 12 capsule 0   cyanocobalamin 2000 MCG tablet Take 1 tablet (2,000 mcg total) by mouth daily. (Patient not taking: Reported on 05/04/2023) 90 tablet 1   predniSONE (DELTASONE) 10 MG tablet  Prednisone 40 mg po daily x 1 day then Prednisone 30 mg po daily x 1 day then Prednisone 20 mg po daily x 1 day then Prednisone 10 mg daily x 1 day then stop... 10 tablet 0   No facility-administered medications prior to visit.     Review of Systems:   Constitutional: No weight loss or gain, night sweats, fevers, chills, or lassitude. +fatigue  HEENT: No headaches, difficulty swallowing, tooth/dental problems, or sore throat. No sneezing, itching, ear ache, nasal congestion, or post nasal drip CV:  No chest pain, orthopnea, PND, swelling in lower extremities, anasarca, dizziness, palpitations, syncope Resp: +shortness of breath with exertion; cough; chest congestion; improved wheeze.  No hemoptysis. No chest wall deformity GI:  No heartburn, indigestion, abdominal pain, nausea, vomiting, diarrhea, change in bowel habits, loss of appetite, bloody stools.  MSK:  No joint pain or swelling.  No decreased range of motion.  No back pain. Neuro: No dizziness or lightheadedness.  Psych: No depression or anxiety. Mood stable.     Physical Exam:  BP 134/80 (BP Location: Left Arm, Cuff Size: Normal)   Pulse (!) 105   Temp 98.7 F (37.1 C) (Oral)   Ht 5\' 2"  (1.575 m)   Wt 136 lb 9.6 oz (62 kg)   SpO2 90% Comment: on RA  BMI 24.98 kg/m   GEN: Pleasant, interactive, well-appearing; in no acute distress. HEENT:  Normocephalic and atraumatic. PERRLA. Sclera white. Nasal turbinates pink, moist and patent bilaterally. No rhinorrhea present. Oropharynx pink and moist, without exudate or edema. No lesions, ulcerations, or postnasal drip.  NECK:  Supple w/ fair ROM. No JVD present. Normal carotid impulses w/o bruits. Thyroid symmetrical with no goiter or nodules palpated. No lymphadenopathy.   CV: RRR, no m/r/g, +1 BLE edema. Pulses intact, +2 bilaterally. No cyanosis, pallor or clubbing. PULMONARY:  Unlabored, regular breathing. Diminished bibasilar airflow. End expiratory wheeze bilaterally A&P. No  accessory muscle use.  GI: BS present and normoactive. Soft, non-tender to palpation. No organomegaly or masses detected.  MSK: No erythema, warmth or tenderness. No joint swelling noted. Muscle wasting  Neuro: A/Ox3. No focal deficits noted.   Skin: Warm, no lesions or rashe Psych: Normal affect and behavior. Judgement and thought content appropriate.     Lab Results:  CBC    Component Value Date/Time   WBC 5.8 04/05/2023 0438   RBC 4.45 04/05/2023 0438   HGB 13.6 04/05/2023 0438   HCT 42.5 04/05/2023 0438   PLT 307 04/05/2023 0438   MCV 95.5 04/05/2023 0438   MCH 30.6 04/05/2023 0438   MCHC 32.0 04/05/2023 0438   RDW 13.6 04/05/2023 0438   LYMPHSABS 2.5 12/22/2021 1500   MONOABS 0.5 12/22/2021 1500   EOSABS 0.0 12/22/2021 1500  BASOSABS 0.1 12/22/2021 1500    BMET    Component Value Date/Time   NA 136 04/05/2023 0438   K 4.4 04/05/2023 0438   CL 101 04/05/2023 0438   CO2 29 04/05/2023 0438   GLUCOSE 143 (H) 04/05/2023 0438   BUN 16 04/05/2023 0438   CREATININE 0.62 04/05/2023 0438   CALCIUM 8.7 (L) 04/05/2023 0438   GFRNONAA >60 04/05/2023 0438   GFRAA >60 11/22/2016 0832    BNP    Component Value Date/Time   BNP 65.0 04/04/2023 1558     Imaging:  DG Chest 2 View  Result Date: 05/04/2023 CLINICAL DATA:  Shortness of breath. EXAM: CHEST - 2 VIEW COMPARISON:  April 04, 2023. FINDINGS: The heart size and mediastinal contours are within normal limits. Old midthoracic compression fracture is noted. Surgical clips are noted projected over left upper lobe. Stable left apical scarring is noted. No definite acute abnormality is noted. Hyperinflation of the lungs is noted. IMPRESSION: Hyperinflation of the lungs. Stable left apical scarring. No definite acute abnormality seen. Electronically Signed   By: Lupita Raider M.D.   On: 05/04/2023 12:29   MR THORACIC SPINE WO CONTRAST  Result Date: 04/05/2023 CLINICAL DATA:  Compression fracture. EXAM: MRI THORACIC SPINE  WITHOUT CONTRAST TECHNIQUE: Multiplanar, multisequence MR imaging of the thoracic spine was performed. No intravenous contrast was administered. COMPARISON:  CT chest 08/06/2022, two-view chest radiograph 04/04/2023 FINDINGS: Alignment: There is exaggerated thoracic kyphosis centered at T8. There is grade 1 anterolisthesis of T4 on T5, T6 on T7, and T7 on T8, increased at T7-T8 compared to the CT from 2023. Vertebrae: There is compression deformity of the T7 vertebral body with an associated prominent Schmorl's node along the superior endplate resulting in up to approximately 50% loss of vertebral body height anteriorly. There is no bony retropulsion. This is unchanged compared to the CT chest from 2023. There is severe compression deformity of the T8 vertebral body with near-complete loss of vertebral body height centrally and mild bony retropulsion measuring approximately 3 mm which is new since the CT from 2023. There is no significant spinal canal stenosis or cord compression. There is mild marrow edema consistent with acute to subacute chronicity. There is no definite extension into the posterior elements. The other vertebral body heights are preserved. There is no other marrow edema. There is an intraosseous hemangioma in the T12 vertebral body. There is no suspicious marrow signal abnormality. Cord:  Normal in signal and morphology. Paraspinal and other soft tissues: Unremarkable. Disc levels: The disc heights in the thoracic spine are overall preserved. There are small disc protrusions at T7-T8 through L2-L3 without significant spinal canal or neural foraminal stenosis. IMPRESSION: 1. Severe compression deformity of the T8 vertebral body with near-complete loss of vertebral body height centrally and 3 mm bony retropulsion is new since the CT from 08/06/2022, with marrow edema suggesting acute to subacute chronicity. No significant spinal canal stenosis or cord compression. 2. Compression deformity of the T7  vertebral body with approximately 50% loss of vertebral body height anteriorly is unchanged since the CT. 3. Mild degenerative changes without significant spinal canal or neural foraminal stenosis. Electronically Signed   By: Lesia Hausen M.D.   On: 04/05/2023 16:14   DG Chest 2 View  Result Date: 04/04/2023 CLINICAL DATA:  Shortness of breath EXAM: CHEST - 2 VIEW COMPARISON:  X-ray 12/05/2022 and older FINDINGS: Hyperinflation. Left apical pleural thickening and scarring. No consolidation or edema. Normal cardiopericardial silhouette. There is  blunting of the inferior costophrenic angles. Pleural thickening is possible rather than effusion. Surgical clips overlie the left upper chest. There is kyphosis with compression of a midthoracic spine vertebral level which is progressive from the prior x-ray. Please correlate with clinical history. IMPRESSION: Hyperinflation with chronic changes. Increasing, now severe compression of the midthoracic spine level compared to previous. Now developing kyphosis. Please correlate for any known history and if needed additional spinal workup as clinically appropriate to exclude a marrow replacement process or neoplasm. Electronically Signed   By: Karen Kays M.D.   On: 04/04/2023 17:28    Administration History     None           Latest Ref Rng & Units 10/24/2018   11:36 AM  PFT Results  FVC-Pre L 2.22   FVC-Predicted Pre % 74   FVC-Post L 2.15   FVC-Predicted Post % 72   Pre FEV1/FVC % % 52   Post FEV1/FCV % % 50   FEV1-Pre L 1.17   FEV1-Predicted Pre % 51   FEV1-Post L 1.08   DLCO uncorrected ml/min/mmHg 15.91   DLCO UNC% % 73   DLVA Predicted % 83   TLC L 6.19   TLC % Predicted % 130   RV % Predicted % 190     No results found for: "NITRICOXIDE"      Assessment & Plan:   COPD with acute exacerbation (HCC) Unresolved AECOPD. Prone to recurrent exacerbations. Failed therapy with azithromycin and daliresp. Does not want to consider  injectable medications. She was 88% on room air at rest today. She required 5 lpm supplemental O2 on POC to maintain saturations >88-90%. She has mild bronchospasm and decreased airflow on exam. She also has BLE edema, which she says has been baseline for the last few months. Prednisone taper. Mucociliary clearance therapies. CXR to assess for pulm edema vs superimposed infection. Labs today. Question component of PH? May consider short course of lasix and echo. Action plan in place. Strict returned precautions. If no improvement, she will need CT imaging of the chest.   Patient Instructions  Continue Breztri 2 puffs Twice daily. Brush tongue and rinse mouth afterwards Continue Albuterol inhaler 2 puffs or 3 mL neb every 6 hours as needed for shortness of breath or wheezing. Notify if symptoms persist despite rescue inhaler/neb use. Use nebs at least twice a day before your [place]. Follow with flutter valve ten times  Restart supplemental oxygen 5 lpm with POC and 2 lpm at night. You need to be wearing oxygen at all times until you're seen back. Goal >88-90%. We will obtain overnight oxygen study to ensure this corrects your oxygen levels at night   -Mucinex [phone] mg Twice daily for chest congestion  -Prednisone taper. 4 tabs for 3 days, then 3 tabs for 3 days, 2 tabs for 3 days, then 1 tab for 3 days, then stop. Take in AM with food.    I will likely prescribe you lasix for a few days after I review your labs to see if this helps with your leg swelling/breathing.   Labs and chest x ray today   Follow up in 2-4 weeks with Dr. Tonia Brooms or Florentina Addison Edgerrin Correia,NP. If symptoms do not improve or worsen, please contact office for sooner follow up or seek emergency care.   Chronic respiratory failure with hypoxia (HCC) New O2 requirement as of June 2024. She has not been using oxygen at home. Educated on importance of correcting hypoxia and utilizing  supplemental O2. Qualified for POC 5 lpm. Will obtain ONO on  continuous 2 lpm. Goal >88-90%  Lung nodule Stable on previous imaging. Repeat LDCT chest 07/2023.   I spent 42 minutes of dedicated to the care of this patient on the date of this encounter to include pre-visit review of records, face-to-face time with the patient discussing conditions above, post visit ordering of testing, clinical documentation with the electronic health record, making appropriate referrals as documented, and communicating necessary findings to members of the patients care team.  Noemi Chapel, NP 05/04/2023  Pt aware and understands NP's role.

## 2023-05-05 ENCOUNTER — Telehealth: Payer: Self-pay

## 2023-05-05 ENCOUNTER — Other Ambulatory Visit: Payer: Self-pay

## 2023-05-05 DIAGNOSIS — J441 Chronic obstructive pulmonary disease with (acute) exacerbation: Secondary | ICD-10-CM

## 2023-05-05 NOTE — Progress Notes (Signed)
Eosinophils are extremely elevated. Recommend having her come back in for additional labs including IgE. I would really recommend biologic therapy for her asthma overlap of her COPD. These are the injectable medications that Dr. Tonia Brooms had discussed with her. We can move up her f/u appt to discuss further. Thanks.

## 2023-05-05 NOTE — Telephone Encounter (Signed)
Called and spoke with pt. She has not started her pred. Pt was at work when I called and could not come and get labs done today. Pt stated she can come Monday and I have advised pt to wait to start pred until labs are drawn. She verbalized understanding. Nfn at this time. Labs have been ordered

## 2023-05-08 MED ORDER — PREDNISONE 10 MG PO TABS
ORAL_TABLET | ORAL | 0 refills | Status: DC
Start: 2023-05-08 — End: 2023-06-07

## 2023-05-08 NOTE — Addendum Note (Signed)
Addended by: Noemi Chapel on: 05/08/2023 11:00 AM   Modules accepted: Orders

## 2023-05-09 DIAGNOSIS — G4730 Sleep apnea, unspecified: Secondary | ICD-10-CM | POA: Diagnosis not present

## 2023-05-12 ENCOUNTER — Other Ambulatory Visit: Payer: Self-pay | Admitting: Internal Medicine

## 2023-05-12 DIAGNOSIS — J441 Chronic obstructive pulmonary disease with (acute) exacerbation: Secondary | ICD-10-CM

## 2023-05-16 ENCOUNTER — Telehealth: Payer: Self-pay | Admitting: Nurse Practitioner

## 2023-05-16 ENCOUNTER — Ambulatory Visit: Payer: Medicare Other | Admitting: Nurse Practitioner

## 2023-05-16 DIAGNOSIS — R0902 Hypoxemia: Secondary | ICD-10-CM

## 2023-05-19 DIAGNOSIS — J441 Chronic obstructive pulmonary disease with (acute) exacerbation: Secondary | ICD-10-CM | POA: Diagnosis not present

## 2023-05-19 NOTE — Telephone Encounter (Signed)
Called and spoke with pt about ono results. She verbalized understanding. I will place an order for repeat ono on 3 L

## 2023-05-24 DIAGNOSIS — G4730 Sleep apnea, unspecified: Secondary | ICD-10-CM | POA: Diagnosis not present

## 2023-05-24 DIAGNOSIS — R0902 Hypoxemia: Secondary | ICD-10-CM | POA: Diagnosis not present

## 2023-05-29 ENCOUNTER — Telehealth: Payer: Self-pay | Admitting: Nurse Practitioner

## 2023-05-29 DIAGNOSIS — R0902 Hypoxemia: Secondary | ICD-10-CM

## 2023-05-29 DIAGNOSIS — R0609 Other forms of dyspnea: Secondary | ICD-10-CM

## 2023-05-29 DIAGNOSIS — J439 Emphysema, unspecified: Secondary | ICD-10-CM

## 2023-05-29 DIAGNOSIS — J441 Chronic obstructive pulmonary disease with (acute) exacerbation: Secondary | ICD-10-CM

## 2023-05-29 DIAGNOSIS — J432 Centrilobular emphysema: Secondary | ICD-10-CM

## 2023-05-29 NOTE — Telephone Encounter (Signed)
See message   Teola Bradley, Nancy Marus; Kathe Becton Could you please have nurse call me regarding this pt.  Thanks!  Elease Hashimoto [phone]

## 2023-05-30 ENCOUNTER — Telehealth: Payer: Self-pay | Admitting: Student

## 2023-05-30 NOTE — Telephone Encounter (Signed)
Lynn Kline at [employer] needs her 6 min walk test results for her POC.  Lynn Kline is @ [phone]  Put in Epic please

## 2023-06-01 ENCOUNTER — Encounter: Payer: Self-pay | Admitting: Nurse Practitioner

## 2023-06-02 NOTE — Telephone Encounter (Signed)
Updated order placed.

## 2023-06-06 DIAGNOSIS — J441 Chronic obstructive pulmonary disease with (acute) exacerbation: Secondary | ICD-10-CM | POA: Diagnosis not present

## 2023-06-07 ENCOUNTER — Encounter: Payer: Self-pay | Admitting: Nurse Practitioner

## 2023-06-07 ENCOUNTER — Ambulatory Visit: Payer: Medicare Other | Admitting: Nurse Practitioner

## 2023-06-07 VITALS — BP 138/78 | HR 85 | Ht 62.0 in | Wt 137.4 lb

## 2023-06-07 DIAGNOSIS — J4489 Other specified chronic obstructive pulmonary disease: Secondary | ICD-10-CM | POA: Diagnosis not present

## 2023-06-07 DIAGNOSIS — R911 Solitary pulmonary nodule: Secondary | ICD-10-CM

## 2023-06-07 DIAGNOSIS — J9611 Chronic respiratory failure with hypoxia: Secondary | ICD-10-CM

## 2023-06-07 NOTE — Telephone Encounter (Signed)
Patient seen today will update records.

## 2023-06-07 NOTE — Patient Instructions (Addendum)
Continue Breztri 2 puffs Twice daily. Brush tongue and rinse mouth afterwards Continue Albuterol inhaler 2 puffs or 3 mL neb every 6 hours as needed for shortness of breath or wheezing. Notify if symptoms persist despite rescue inhaler/neb use. Use nebs at least twice a day before your [place]. Follow with flutter valve ten times  Continue supplemental oxygen 4 lpm with POC with activity and 3 lpm at night. Goal >88-90%. Ok to stay off oxygen at rest as long as oxygen >88-90% Continue Mucinex [phone] mg Twice daily for chest congestion   Consider injections with Dupixent as you have a very high eosinophil count (type of white blood cell that causes more inflammation in your airways) which is contributing to the flare ups you have been having and your difficulties with breathing    Follow up in 6-8 weeks with Dr. Tonia Brooms or Florentina Addison Arine Foley,NP. If symptoms do not improve or worsen, please contact office for sooner follow up or seek emergency care.

## 2023-06-07 NOTE — Assessment & Plan Note (Addendum)
Severe COPD with asthmatic features.  She does have a significantly elevated eosinophil count (1100) on recent testing.  She has been tried on Daliresp and azithromycin in the past but was unable to tolerate either these.  Previous discussion surrounding injectable medicines.  She was hesitant to do these.  We revisited treatment with biologic therapy today.  She is willing to consider but would like to take some time to think about it.  Reviewed side effect profile.  She will call us if she decides to move forward.  In the interim, she will continue current bronchodilator regimen and maximize mucociliary clearance therapies.  Action plan in place.  Encouraged to remain active and work on graded exercises.  Patient Instructions  Continue Breztri 2 puffs Twice daily. Brush tongue and rinse mouth afterwards Continue Albuterol inhaler 2 puffs or 3 mL neb every 6 hours as needed for shortness of breath or wheezing. Notify if symptoms persist despite rescue inhaler/neb use. Use nebs at least twice a day before your [place]. Follow with flutter valve ten times  Continue supplemental oxygen 4 lpm with POC with activity and 3 lpm at night. Goal >88-90%. Ok to stay off oxygen at rest as long as oxygen >88-90% Continue Mucinex [id number] mg Twice daily for chest congestion   Consider injections with Dupixent as you have a very high eosinophil count (type of white blood cell that causes more inflammation in your airways) which is contributing to the flare ups you have been having and your difficulties with breathing    Follow up in 6-8 weeks with Dr. Tonia Brooms or Florentina Addison Pierina Schuknecht,NP. If symptoms do not improve or worsen, please contact office for sooner follow up or seek emergency care.

## 2023-06-07 NOTE — Progress Notes (Signed)
@Patient  ID: Lynn Kline, female    DOB: [birth year], 67 y.o.   MRN: [id number]  Chief Complaint  Patient presents with   Follow-up    Pt is here for COPD F/U visit. Pt has Upper Left Lobe Nodule. Pt only has SOB when doing activities.    Referring provider: Etta Grandchild, MD  HPI: 67 year old female, former smoker followed for severe COPD and lung nodule. She had an abnormal lung cancer screening CT in 2021 followed by bronchoscopy with non-diagnostic nodules and negative for malignancy. She is a patient of Dr. Myrlene Broker and last seen in office 05/04/2023 by [facility] NP. Past medical history significant for CAD, IBS, anxiety, HLD.  TEST/EVENTS:  10/24/2018 PFT: FVC 74, FEV1 51, ratio 50, TLC 130, DLCOunc 73 08/06/2022 CT chest: there is a trace pericardial effusion. Emphysematous changes with apical pleural scarring. Bronchial wall thickening is present bilaterally. Stable scarring and metallic markers of the LUL. There is a 3 mm nodule in RLL, unchanged.  04/04/2023 CXR: hyperinflation. Left apical pleural thickening and scarring. Blunting of the inferior costophrenic angles. Pleural thickening is possible vs effusion. Kyphosis with compression fx of midthoracic spine, progressive since previous   07/20/2022: OV with Dr. Tonia Brooms. Multiple recent exacerbations requiring steroids and abx. Using breztri twice daily. Just completed another course of prednisone. She has cough, sputum production, chest tightness, wheezing. Started on azithromycin M/W/F. Plan to repeat ECG in 4 weeks. Repeat CT chest for evaluation of nodules ordered  08/18/2022: OV with Clancy Leiner NP for follow up. She has been doing better since she was here last; still with daily productive cough and chest congestion. She feels like the azithromycin makes her feel bad and causes her heart to race. She's noticed that since she started it, it feels like her heart pounds every time she gets up to move. She doesn't want to continue on it right  now. She denies any lightheadedness, dizziness or chest pain. Her breathing is stable. No increased wheezing. She is using Breztri twice daily. D/c azithromycin. Started her on daliresp.   09/15/2022: OV with Brigg Cape NP for follow up after being started on daliresp. She had trouble tolerating the daliresp due to loose stools and upset stomach, even with the 250 mcg dose. She has since stopped it. Breathing has been stable for the last few weeks. She did purchase a portable neb machine, which she feels like has helped because she can use it while she's at work. 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 She does take frequent breaks outside and her boss has started working on cleaning out the supply room, so she hopes this will help. No increased cough or chest congestion. She continues on [place] twice daily. Using her nebs 1-3 times a day.   11/03/2022: OV with Dr. Tonia Brooms. Did not tolerate the azithromycin. Unsure if this was related to medication or ongoing exacerbations of COPD. Trouble with breathing chemical smells and dust at work. 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 Only on nebulized treatments and her inhalers. Unable to tolerate daliresp in the past due to diarrhea. Recommend trying azithromycin only M/W/F.   02/13/2023: OV with Dr. Tonia Brooms. Did not tolerate azithromycin M/W/F. Discussed evaluation for asthma/COPD overlap since she does have elevated eos previously. Would consider ordering repeat lab work, RAST, and IgE; however, she does not want to use any injection meds. Does not make sense  to do further workup if she is not willing to try anything else. Recommend daily antihistamine.   05/04/2023: Sudie Grumbling with Kimba Lottes NP for hospital follow up. She was admitted 04/04/2023-04/10/2023 for acute respiratory failure secondary to AECOPD. She was treated with steroids, doxycyline, and bronchodilators. She was also found to have  compression fracture of T7 and T8 which ortho deemed chronic. She was advised to have DEXA scan as an outpatient. She was discharged on prednisone taper, supplemental O2 2 lpm, and vitamin D. She tells me today that she is feeling better. Still has a cough and chest congestion. Not producing much phlegm but when she gets something up, it's white in color. Notices some wheezing but improved compared to when she was in the hospital. Breathing is feeling closer to her baseline but she is having to rest more. She's not wearing her oxygen. She's been checking her levels at home and says they have been 93-94% at rest and will drop lower with activity but recovers quickly with rest again. She does have some swelling in her ankles/feet that she's noticed for the past few months. She denies orthopnea, PND, fevers, chills, hemoptysis, calf pain, CP. She is using her nebs every 6 hours, which is what they were doing in the hospital.   06/07/2023: Today-follow-up Patient presents today for follow-up visit.  She feels better since our last appointment.  She completed prednisone taper.  She has been wearing her oxygen at night, which does seem to make her feel better.  She is only using it during the day when she goes out of the house.  Does not feel like she needs it when she is at home.  She is relatively sedentary with her current job.  She does try to get up and walk around the house to get some exercise, which she does also feel helps her lungs.  Still having some cough but it is mostly after her neb treatments.  Chest congestion has improved.  Not producing much phlegm.  Still clear to white in color whenever she does get it up.  No wheezing.  Denies any fevers, chills, mops this, lower extremity swelling, orthopnea.  Swelling in her legs resolved.  Using nebs twice a day followed by [employer].  Still taking Mucinex.  She did have significantly elevated eosinophil count on previous blood work.  BNP was normal.  Allergies   Allergen Reactions   Codeine Nausea And Vomiting   Erythromycin Nausea And Vomiting   Ampicillin Nausea And Vomiting, Rash and Other (See Comments)    Pt states that rash was on her tongue.  Has patient had a PCN reaction causing immediate rash, facial/tongue/throat swelling, SOB or lightheadedness with hypotension yes Has patient had a PCN reaction causing severe rash involving mucus membranes or skin necrosis: no Has patient had a PCN reaction that required hospitalization yes Has patient had a PCN reaction occurring within the last 10 years: no If all of the above answers are "NO", then may proceed with Cephalosporin use.     Immunization History  Administered Date(s) Administered   DTaP 06/14/2005   Fluad Quad(high Dose 65+) 07/04/2020, 07/14/2021   Influenza Whole 10/23/2011   Influenza,inj,Quad PF,6+ Mos 08/19/2014, 10/24/2018, 08/01/2019   PFIZER(Purple Top)SARS-COV-2 Vaccination 07/04/2020, 07/25/2020   Pneumococcal Conjugate-13 09/14/2005   Pneumococcal Polysaccharide-23 08/19/2014, 12/22/2021   Tdap 09/22/2013    Past Medical History:  Diagnosis Date   Anxiety    Atherosclerosis    Colon polyp, hyperplastic    COPD (chronic  obstructive pulmonary disease) (HCC)    Diverticulosis    Gallstone    Pneumonia     Tobacco History: Social History   Tobacco Use  Smoking Status Former   Current packs/day: 0.00   Types: Cigarettes   Quit date: 12/15/2021   Years since quitting: 1.4  Smokeless Tobacco Never  Tobacco Comments   Pt states she quit smoking in March 2023. 07/21/2022 Tay   Counseling given: Not Answered Tobacco comments: Pt states she quit smoking in March 2023. 07/21/2022 Tay   Outpatient Medications Prior to Visit  Medication Sig Dispense Refill   albuterol (PROVENTIL) (2.5 MG/3ML) 0.083% nebulizer solution USE 3 ML VIA NEBULIZER EVERY 4 HOURS AS NEEDED FOR WHEEZING OR SHORTNESS OF BREATH (Patient taking differently: Take 2.5 mg by nebulization every 4  (four) hours as needed for shortness of breath or wheezing.) 75 mL 5   ALPRAZolam (XANAX) 0.25 MG tablet 1 tab yb mouth twice per day as needed 60 tablet 3   Budeson-Glycopyrrol-Formoterol (BREZTRI AEROSPHERE) 160-9-4.8 MCG/ACT AERO Inhale 2 puffs into the lungs in the morning and at bedtime. 32.1 g 1   cyanocobalamin 2000 MCG tablet Take 1 tablet (2,000 mcg total) by mouth daily. 90 tablet 1   VENTOLIN HFA 108 (90 Base) MCG/ACT inhaler INHALE 1 TO 2 PUFFS INTO THE LUNGS EVERY 6 HOURS AS NEEDED FOR WHEEZING OR SHORTNESS OF BREATH 18 g 3   Vitamin D, Ergocalciferol, (DRISDOL) 1.25 MG (50000 UNIT) CAPS capsule Take 1 capsule (50,000 Units total) by mouth every 7 (seven) days. 12 capsule 0   predniSONE (DELTASONE) 10 MG tablet 4 tabs for 3 days, then 3 tabs for 3 days, 2 tabs for 3 days, then 1 tab for 3 days, then stop 30 tablet 0   No facility-administered medications prior to visit.     Review of Systems:   Constitutional: No weight loss or gain, night sweats, fevers, chills, or lassitude. +fatigue (improved) HEENT: No headaches, difficulty swallowing, tooth/dental problems, or sore throat. No sneezing, itching, ear ache, nasal congestion, or post nasal drip CV:  No chest pain, orthopnea, PND, swelling in lower extremities, anasarca, dizziness, palpitations, syncope Resp: +shortness of breath with exertion; cough. No wheeze.  No hemoptysis. No chest wall deformity GI:  No heartburn, indigestion, abdominal pain, nausea, vomiting, diarrhea, change in bowel habits, loss of appetite, bloody stools.  MSK:  No joint pain or swelling.   Neuro: No dizziness or lightheadedness.  Psych: No depression or anxiety. Mood stable.     Physical Exam:  BP 138/78 (BP Location: Right Arm, Cuff Size: Normal)   Pulse 85   Ht 5\' 2"  (1.575 m)   Wt 137 lb 6.4 oz (62.3 kg)   SpO2 94%   BMI 25.13 kg/m   GEN: Pleasant, interactive, chronically ill-appearing; in no acute distress. HEENT:  Normocephalic and  atraumatic. PERRLA. Sclera white. Nasal turbinates pink, moist and patent bilaterally. No rhinorrhea present. Oropharynx pink and moist, without exudate or edema. No lesions, ulcerations, or postnasal drip.  NECK:  Supple w/ fair ROM. No JVD present. Normal carotid impulses w/o bruits. Thyroid symmetrical with no goiter or nodules palpated. No lymphadenopathy.   CV: RRR, no m/r/g, no peripheral edema. Pulses intact, +2 bilaterally. No cyanosis, pallor or clubbing. PULMONARY:  Unlabored, regular breathing. Diminished bibasilar airflow. No accessory muscle use.  GI: BS present and normoactive. Soft, non-tender to palpation. No organomegaly or masses detected.  MSK: No erythema, warmth or tenderness. No joint swelling noted. Muscle wasting  Neuro: A/Ox3. No focal deficits noted.   Skin: Warm, no lesions or rashe Psych: Normal affect and behavior. Judgement and thought content appropriate.     Lab Results:  CBC    Component Value Date/Time   WBC 8.8 05/04/2023 1159   RBC 4.23 05/04/2023 1159   HGB 12.7 05/04/2023 1159   HCT 38.8 05/04/2023 1159   PLT 740.0 (H) 05/04/2023 1159   MCV 91.7 05/04/2023 1159   MCH 30.6 04/05/2023 0438   MCHC 32.8 05/04/2023 1159   RDW 13.7 05/04/2023 1159   LYMPHSABS 1.8 05/04/2023 1159   MONOABS 0.9 05/04/2023 1159   EOSABS 1.1 (H) 05/04/2023 1159   BASOSABS 0.1 05/04/2023 1159    BMET    Component Value Date/Time   NA 138 05/04/2023 1159   K 4.4 05/04/2023 1159   CL 100 05/04/2023 1159   CO2 31 05/04/2023 1159   GLUCOSE 102 (H) 05/04/2023 1159   BUN 14 05/04/2023 1159   CREATININE 0.67 05/04/2023 1159   CALCIUM 9.2 05/04/2023 1159   GFRNONAA >60 04/05/2023 0438   GFRAA >60 11/22/2016 0832    BNP    Component Value Date/Time   BNP 65.0 04/04/2023 1558     Imaging:  No results found.  Administration History     None           Latest Ref Rng & Units 10/24/2018   11:36 AM  PFT Results  FVC-Pre L 2.22   FVC-Predicted Pre % 74    FVC-Post L 2.15   FVC-Predicted Post % 72   Pre FEV1/FVC % % 52   Post FEV1/FCV % % 50   FEV1-Pre L 1.17   FEV1-Predicted Pre % 51   FEV1-Post L 1.08   DLCO uncorrected ml/min/mmHg 15.91   DLCO UNC% % 73   DLVA Predicted % 83   TLC L 6.19   TLC % Predicted % 130   RV % Predicted % 190     No results found for: "NITRICOXIDE"      Assessment & Plan:   COPD with asthma Severe COPD with asthmatic features.  She does have a significantly elevated eosinophil count (1100) on recent testing.  She has been tried on Daliresp and azithromycin in the past but was unable to tolerate either these.  Previous discussion surrounding injectable medicines.  She was hesitant to do these.  We revisited treatment with biologic therapy today.  She is willing to consider but would like to take some time to think about it.  Reviewed side effect profile.  She will call us if she decides to move forward.  In the interim, she will continue current bronchodilator regimen and maximize mucociliary clearance therapies.  Action plan in place.  Encouraged to remain active and work on graded exercises.  Patient Instructions  Continue Breztri 2 puffs Twice daily. Brush tongue and rinse mouth afterwards Continue Albuterol inhaler 2 puffs or 3 mL neb every 6 hours as needed for shortness of breath or wheezing. Notify if symptoms persist despite rescue inhaler/neb use. Use nebs at least twice a day before your [place]. Follow with flutter valve ten times  Continue supplemental oxygen 4 lpm with POC with activity and 3 lpm at night. Goal >88-90%. Ok to stay off oxygen at rest as long as oxygen >88-90% Continue Mucinex [phone] mg Twice daily for chest congestion   Consider injections with Dupixent as you have a very high eosinophil count (type of white blood cell that causes more inflammation in your  airways) which is contributing to the flare ups you have been having and your difficulties with breathing    Follow up in  6-8 weeks with Dr. Tonia Brooms or Florentina Addison Johnathin Vanderschaaf,NP. If symptoms do not improve or worsen, please contact office for sooner follow up or seek emergency care.   Chronic respiratory failure with hypoxia (HCC) Able to maintain oxygen above 88 to 90% on room air at rest.  Still requiring supplemental O2 with exertion and at night.  Encouraged to monitor for goal greater than 88 to 90%.  Lung nodule Stable on previous imaging. Repeat LDCT chest 07/2023     I spent 38 minutes of dedicated to the care of this patient on the date of this encounter to include pre-visit review of records, face-to-face time with the patient discussing conditions above, post visit ordering of testing, clinical documentation with the electronic health record, making appropriate referrals as documented, and communicating necessary findings to members of the patients care team.  Noemi Chapel, NP 06/07/2023  Pt aware and understands NP's role.

## 2023-06-07 NOTE — Assessment & Plan Note (Signed)
 Stable on previous imaging. Repeat LDCT chest 07/2023.

## 2023-06-07 NOTE — Assessment & Plan Note (Signed)
Able to maintain oxygen above 88 to 90% on room air at rest.  Still requiring supplemental O2 with exertion and at night.  Encouraged to monitor for goal greater than 88 to 90%.

## 2023-06-15 ENCOUNTER — Telehealth: Payer: Self-pay | Admitting: Nurse Practitioner

## 2023-06-15 NOTE — Telephone Encounter (Signed)
05/24/2023 ONO 3 lpm O2: 5 min 3 sec </88%, SpO2 low 83%, average 95%. No adjustments to current O2 order. Pt made aware via MyChart.

## 2023-06-19 ENCOUNTER — Other Ambulatory Visit: Payer: Self-pay | Admitting: Internal Medicine

## 2023-06-19 DIAGNOSIS — J432 Centrilobular emphysema: Secondary | ICD-10-CM

## 2023-06-19 DIAGNOSIS — J441 Chronic obstructive pulmonary disease with (acute) exacerbation: Secondary | ICD-10-CM

## 2023-06-19 DIAGNOSIS — R0609 Other forms of dyspnea: Secondary | ICD-10-CM

## 2023-06-20 ENCOUNTER — Telehealth: Payer: Self-pay | Admitting: Nurse Practitioner

## 2023-06-20 DIAGNOSIS — J439 Emphysema, unspecified: Secondary | ICD-10-CM

## 2023-06-20 NOTE — Telephone Encounter (Addendum)
Pt needs a refill sent in for her Budeson-Glycopyrrol-Formoterol (BREZTRI AEROSPHERE) 160-9-4.8 MCG/ACT AERO   [facility] [place] [facility] [place]  Fax: [phone]

## 2023-06-26 ENCOUNTER — Telehealth: Payer: Self-pay | Admitting: Student

## 2023-06-26 DIAGNOSIS — J439 Emphysema, unspecified: Secondary | ICD-10-CM

## 2023-06-26 MED ORDER — BREZTRI AEROSPHERE 160-9-4.8 MCG/ACT IN AERO
2.0000 | INHALATION_SPRAY | Freq: Two times a day (BID) | RESPIRATORY_TRACT | 1 refills | Status: DC
Start: 2023-06-26 — End: 2023-08-29

## 2023-06-26 MED ORDER — BREZTRI AEROSPHERE 160-9-4.8 MCG/ACT IN AERO
2.0000 | INHALATION_SPRAY | Freq: Two times a day (BID) | RESPIRATORY_TRACT | 1 refills | Status: DC
Start: 2023-06-26 — End: 2023-06-26

## 2023-06-26 NOTE — Telephone Encounter (Signed)
PT calling because RX just sent to wrong Pharm. See last signed encounter. It was supposed to go to [facility]. Please resend. Thanks.

## 2023-06-26 NOTE — Telephone Encounter (Signed)
Refill updated.

## 2023-06-26 NOTE — Telephone Encounter (Signed)
Refill sent.

## 2023-07-07 DIAGNOSIS — J441 Chronic obstructive pulmonary disease with (acute) exacerbation: Secondary | ICD-10-CM | POA: Diagnosis not present

## 2023-07-19 DIAGNOSIS — J441 Chronic obstructive pulmonary disease with (acute) exacerbation: Secondary | ICD-10-CM | POA: Diagnosis not present

## 2023-07-21 ENCOUNTER — Other Ambulatory Visit: Payer: Self-pay | Admitting: Internal Medicine

## 2023-07-21 DIAGNOSIS — Z1212 Encounter for screening for malignant neoplasm of rectum: Secondary | ICD-10-CM

## 2023-07-21 DIAGNOSIS — Z1211 Encounter for screening for malignant neoplasm of colon: Secondary | ICD-10-CM

## 2023-08-06 DIAGNOSIS — J441 Chronic obstructive pulmonary disease with (acute) exacerbation: Secondary | ICD-10-CM | POA: Diagnosis not present

## 2023-08-07 ENCOUNTER — Other Ambulatory Visit: Payer: Self-pay | Admitting: Internal Medicine

## 2023-08-07 ENCOUNTER — Ambulatory Visit (HOSPITAL_BASED_OUTPATIENT_CLINIC_OR_DEPARTMENT_OTHER)
Admission: RE | Admit: 2023-08-07 | Discharge: 2023-08-07 | Disposition: A | Discharge status: Home / Self Care | Payer: Medicare Other | Source: Ambulatory Visit | Attending: Acute Care | Admitting: Acute Care

## 2023-08-07 DIAGNOSIS — J441 Chronic obstructive pulmonary disease with (acute) exacerbation: Secondary | ICD-10-CM

## 2023-08-07 DIAGNOSIS — Z87891 Personal history of nicotine dependence: Secondary | ICD-10-CM | POA: Diagnosis not present

## 2023-08-07 DIAGNOSIS — Z122 Encounter for screening for malignant neoplasm of respiratory organs: Secondary | ICD-10-CM | POA: Insufficient documentation

## 2023-08-07 DIAGNOSIS — F17210 Nicotine dependence, cigarettes, uncomplicated: Secondary | ICD-10-CM | POA: Diagnosis not present

## 2023-08-08 ENCOUNTER — Ambulatory Visit: Payer: Medicare Other | Admitting: Nurse Practitioner

## 2023-08-08 ENCOUNTER — Encounter: Payer: Self-pay | Admitting: Nurse Practitioner

## 2023-08-08 VITALS — BP 122/78 | HR 87 | Temp 98.4°F | Ht 62.0 in | Wt 133.0 lb

## 2023-08-08 DIAGNOSIS — J439 Emphysema, unspecified: Secondary | ICD-10-CM

## 2023-08-08 DIAGNOSIS — J441 Chronic obstructive pulmonary disease with (acute) exacerbation: Secondary | ICD-10-CM

## 2023-08-08 DIAGNOSIS — J9611 Chronic respiratory failure with hypoxia: Secondary | ICD-10-CM | POA: Diagnosis not present

## 2023-08-08 DIAGNOSIS — Z23 Encounter for immunization: Secondary | ICD-10-CM

## 2023-08-08 DIAGNOSIS — R911 Solitary pulmonary nodule: Secondary | ICD-10-CM

## 2023-08-08 DIAGNOSIS — J4489 Other specified chronic obstructive pulmonary disease: Secondary | ICD-10-CM | POA: Diagnosis not present

## 2023-08-08 MED ORDER — PREDNISONE 5 MG PO TABS: 5.0000 mg | ORAL_TABLET | Freq: Every day | ORAL | 1 refills | Status: DC

## 2023-08-08 MED ORDER — PREDNISONE 10 MG PO TABS
ORAL_TABLET | ORAL | 0 refills | Status: DC
Start: 2023-08-08 — End: 2023-11-21

## 2023-08-08 NOTE — Assessment & Plan Note (Signed)
Previous 3 mm nodule in RLL, unchanged from 2022 to 2023. Follows with the lung cancer screening program now. LDCT chest yesterday, 10/21. Awaiting final results.

## 2023-08-08 NOTE — Assessment & Plan Note (Signed)
Stable without increased O2 requirement. Goal >88-90% 

## 2023-08-08 NOTE — Progress Notes (Signed)
@Patient  ID: Lynn Kline, female    DOB: [birth year], 67 y.o.   MRN: [id number]  Chief Complaint  Patient presents with   Follow-up    Cough and wheeze and SOB with exertion persistent.    Referring provider: Etta Grandchild, MD  HPI: 67 year old female, former smoker followed for severe COPD and lung nodule. She had an abnormal lung cancer screening CT in 2021 followed by bronchoscopy with non-diagnostic nodules and negative for malignancy. She is a patient of Dr. Myrlene Broker and last seen in office 06/07/2023 by [facility] NP. Past medical history significant for CAD, IBS, anxiety, HLD.  TEST/EVENTS:  10/24/2018 PFT: FVC 74, FEV1 51, ratio 50, TLC 130, DLCOunc 73 08/06/2022 CT chest: there is a trace pericardial effusion. Emphysematous changes with apical pleural scarring. Bronchial wall thickening is present bilaterally. Stable scarring and metallic markers of the LUL. There is a 3 mm nodule in RLL, unchanged.  04/04/2023 CXR: hyperinflation. Left apical pleural thickening and scarring. Blunting of the inferior costophrenic angles. Pleural thickening is possible vs effusion. Kyphosis with compression fx of midthoracic spine, progressive since previous   07/20/2022: OV with Dr. Tonia Brooms. Multiple recent exacerbations requiring steroids and abx. Using breztri twice daily. Just completed another course of prednisone. She has cough, sputum production, chest tightness, wheezing. Started on azithromycin M/W/F. Plan to repeat ECG in 4 weeks. Repeat CT chest for evaluation of nodules ordered  08/18/2022: OV with Alida Greiner NP for follow up. She has been doing better since she was here last; still with daily productive cough and chest congestion. She feels like the azithromycin makes her feel bad and causes her heart to race. She's noticed that since she started it, it feels like her heart pounds every time she gets up to move. She doesn't want to continue on it right now. She denies any lightheadedness, dizziness or  chest pain. Her breathing is stable. No increased wheezing. She is using Breztri twice daily. D/c azithromycin. Started her on daliresp.   09/15/2022: OV with Kord Monette NP for follow up after being started on daliresp. She had trouble tolerating the daliresp due to loose stools and upset stomach, even with the 250 mcg dose. She has since stopped it. Breathing has been stable for the last few weeks. She did purchase a portable neb machine, which she feels like has helped because she can use it while she's at work. 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 She does take frequent breaks outside and her boss has started working on cleaning out the supply room, so she hopes this will help. No increased cough or chest congestion. She continues on [place] twice daily. Using her nebs 1-3 times a day.   11/03/2022: OV with Dr. Tonia Brooms. Did not tolerate the azithromycin. Unsure if this was related to medication or ongoing exacerbations of COPD. Trouble with breathing chemical smells and dust at work. 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 Only on nebulized treatments and her inhalers. Unable to tolerate daliresp in the past due to diarrhea. Recommend trying azithromycin only M/W/F.   02/13/2023: OV with Dr. Tonia Brooms. Did not tolerate azithromycin M/W/F. Discussed evaluation for asthma/COPD overlap since she does have elevated eos previously. Would consider ordering repeat lab work, RAST, and IgE; however, she does not want to use any injection meds. Does not make sense to do further workup if she is not willing to try anything  else. Recommend daily antihistamine.   05/04/2023: Sudie Grumbling with Reana Chacko NP for hospital follow up. She was admitted 04/04/2023-04/10/2023 for acute respiratory failure secondary to AECOPD. She was treated with steroids, doxycyline, and bronchodilators. She was also found to have compression fracture of T7 and T8 which ortho deemed  chronic. She was advised to have DEXA scan as an outpatient. She was discharged on prednisone taper, supplemental O2 2 lpm, and vitamin D. She tells me today that she is feeling better. Still has a cough and chest congestion. Not producing much phlegm but when she gets something up, it's white in color. Notices some wheezing but improved compared to when she was in the hospital. Breathing is feeling closer to her baseline but she is having to rest more. She's not wearing her oxygen. She's been checking her levels at home and says they have been 93-94% at rest and will drop lower with activity but recovers quickly with rest again. She does have some swelling in her ankles/feet that she's noticed for the past few months. She denies orthopnea, PND, fevers, chills, hemoptysis, calf pain, CP. She is using her nebs every 6 hours, which is what they were doing in the hospital.   06/07/2023: OV with Yalexa Blust NP for follow-up visit.  She feels better since our last appointment.  She completed prednisone taper.  She has been wearing her oxygen at night, which does seem to make her feel better.  She is only using it during the day when she goes out of the house.  Does not feel like she needs it when she is at home.  She is relatively sedentary with her current job.  She does try to get up and walk around the house to get some exercise, which she does also feel helps her lungs.  Still having some cough but it is mostly after her neb treatments.  Chest congestion has improved.  Not producing much phlegm.  Still clear to white in color whenever she does get it up.  No wheezing.  Denies any fevers, chills, mops this, lower extremity swelling, orthopnea.  Swelling in her legs resolved.  Using nebs twice a day followed by [employer].  Still taking Mucinex.  She did have significantly elevated eosinophil count on previous blood work.  BNP was normal.  08/08/2023: Today - follow up Patient presents today for follow up. She was doing  okay since she was here last up until the last two weeks or so. She's been having more trouble with her cough and feels more congested. She is having difficulties getting the mucus up, especially in the mornings. When she does, it is clear to white, which is normal for her. She does feel a little more short winded. Noticing more wheezing. She denies fevers, chills, hemoptysis, leg swelling, orthopnea. She is using her Breztri twice a day and nebs twice a day. She uses her flutter valve in the mornings. Only taking mucinex once a day. She always feels better with steroids she says. No increased oxygen requirement. She had lung cancer screening CT yesterday.   Allergies  Allergen Reactions   Codeine Nausea And Vomiting   Erythromycin Nausea And Vomiting   Ampicillin Nausea And Vomiting, Rash and Other (See Comments)    Pt states that rash was on her tongue.  Has patient had a PCN reaction causing immediate rash, facial/tongue/throat swelling, SOB or lightheadedness with hypotension yes Has patient had a PCN reaction causing severe rash involving mucus membranes or skin necrosis: no Has  patient had a PCN reaction that required hospitalization yes Has patient had a PCN reaction occurring within the last 10 years: no If all of the above answers are "NO", then may proceed with Cephalosporin use.     Immunization History  Administered Date(s) Administered   DTaP 06/14/2005   Fluad Quad(high Dose 65+) 07/04/2020, 07/14/2021   Fluad Trivalent(High Dose 65+) 08/08/2023   Influenza Whole 10/23/2011   Influenza,inj,Quad PF,6+ Mos 08/19/2014, 10/24/2018, 08/01/2019   PFIZER(Purple Top)SARS-COV-2 Vaccination 07/04/2020, 07/25/2020   Pneumococcal Conjugate-13 09/14/2005   Pneumococcal Polysaccharide-23 08/19/2014, 12/22/2021   Tdap 09/22/2013    Past Medical History:  Diagnosis Date   Anxiety    Atherosclerosis    Colon polyp, hyperplastic    COPD (chronic obstructive pulmonary disease) (HCC)     Diverticulosis    Gallstone    Pneumonia     Tobacco History: Social History   Tobacco Use  Smoking Status Former   Current packs/day: 0.00   Types: Cigarettes   Quit date: 12/15/2021   Years since quitting: 1.6  Smokeless Tobacco Never  Tobacco Comments   Pt states she quit smoking in March 2023. 07/21/2022 Tay   Counseling given: Not Answered Tobacco comments: Pt states she quit smoking in March 2023. 07/21/2022 Tay   Outpatient Medications Prior to Visit  Medication Sig Dispense Refill   albuterol (PROVENTIL) (2.5 MG/3ML) 0.083% nebulizer solution USE 3 ML VIA NEBULIZER EVERY 4 HOURS AS NEEDED FOR WHEEZING OR SHORTNESS OF BREATH 75 mL 5   ALPRAZolam (XANAX) 0.25 MG tablet 1 tab yb mouth twice per day as needed 60 tablet 3   Budeson-Glycopyrrol-Formoterol (BREZTRI AEROSPHERE) 160-9-4.8 MCG/ACT AERO Inhale 2 puffs into the lungs in the morning and at bedtime. 32.1 g 1   cyanocobalamin 2000 MCG tablet Take 1 tablet (2,000 mcg total) by mouth daily. 90 tablet 1   OXYGEN Inhale 3 L into the lungs at bedtime. 3L at bedtime and prn during day     VENTOLIN HFA 108 (90 Base) MCG/ACT inhaler INHALE 1 TO 2 PUFFS INTO THE LUNGS EVERY 6 HOURS AS NEEDED FOR WHEEZING OR SHORTNESS OF BREATH 18 g 3   No facility-administered medications prior to visit.     Review of Systems:   Constitutional: No weight loss or gain, night sweats, fevers, chills, or lassitude. +fatigue (improved) HEENT: No headaches, difficulty swallowing, tooth/dental problems, or sore throat. No sneezing, itching, ear ache, nasal congestion, or post nasal drip CV:  No chest pain, orthopnea, PND, swelling in lower extremities, anasarca, dizziness, palpitations, syncope Resp: +shortness of breath with exertion; cough. No wheeze.  No hemoptysis. No chest wall deformity GI:  No heartburn, indigestion, abdominal pain, nausea, vomiting, diarrhea, change in bowel habits, loss of appetite, bloody stools.  MSK:  No joint pain or  swelling.   Neuro: No dizziness or lightheadedness.  Psych: No depression or anxiety. Mood stable.     Physical Exam:  BP 122/78 (BP Location: Right Arm, Patient Position: Sitting, Cuff Size: Normal)   Pulse 87   Temp 98.4 F (36.9 C) (Oral)   Ht 5\' 2"  (1.575 m)   Wt 133 lb (60.3 kg)   SpO2 95%   BMI 24.33 kg/m   GEN: Pleasant, interactive, chronically ill-appearing; in no acute distress. HEENT:  Normocephalic and atraumatic. PERRLA. Sclera white. Nasal turbinates pink, moist and patent bilaterally. No rhinorrhea present. Oropharynx pink and moist, without exudate or edema. No lesions, ulcerations, or postnasal drip.  NECK:  Supple w/ fair ROM. No JVD  present. Normal carotid impulses w/o bruits. Thyroid symmetrical with no goiter or nodules palpated. No lymphadenopathy.   CV: RRR, no m/r/g, no peripheral edema. Pulses intact, +2 bilaterally. No cyanosis, pallor or clubbing. PULMONARY:  Unlabored, regular breathing. Scattered wheezes bilaterally A&P. No accessory muscle use.  GI: BS present and normoactive. Soft, non-tender to palpation. No organomegaly or masses detected.  MSK: No erythema, warmth or tenderness. No joint swelling noted. Muscle wasting  Neuro: A/Ox3. No focal deficits noted.   Skin: Warm, no lesions or rashe Psych: Normal affect and behavior. Judgement and thought content appropriate.     Lab Results:  CBC    Component Value Date/Time   WBC 8.8 05/04/2023 1159   RBC 4.23 05/04/2023 1159   HGB 12.7 05/04/2023 1159   HCT 38.8 05/04/2023 1159   PLT 740.0 (H) 05/04/2023 1159   MCV 91.7 05/04/2023 1159   MCH 30.6 04/05/2023 0438   MCHC 32.8 05/04/2023 1159   RDW 13.7 05/04/2023 1159   LYMPHSABS 1.8 05/04/2023 1159   MONOABS 0.9 05/04/2023 1159   EOSABS 1.1 (H) 05/04/2023 1159   BASOSABS 0.1 05/04/2023 1159    BMET    Component Value Date/Time   NA 138 05/04/2023 1159   K 4.4 05/04/2023 1159   CL 100 05/04/2023 1159   CO2 31 05/04/2023 1159    GLUCOSE 102 (H) 05/04/2023 1159   BUN 14 05/04/2023 1159   CREATININE 0.67 05/04/2023 1159   CALCIUM 9.2 05/04/2023 1159   GFRNONAA >60 04/05/2023 0438   GFRAA >60 11/22/2016 0832    BNP    Component Value Date/Time   BNP 65.0 04/04/2023 1558     Imaging:  No results found.  Administration History     None           Latest Ref Rng & Units 10/24/2018   11:36 AM  PFT Results  FVC-Pre L 2.22   FVC-Predicted Pre % 74   FVC-Post L 2.15   FVC-Predicted Post % 72   Pre FEV1/FVC % % 52   Post FEV1/FCV % % 50   FEV1-Pre L 1.17   FEV1-Predicted Pre % 51   FEV1-Post L 1.08   DLCO uncorrected ml/min/mmHg 15.91   DLCO UNC% % 73   DLVA Predicted % 83   TLC L 6.19   TLC % Predicted % 130   RV % Predicted % 190     No results found for: "NITRICOXIDE"      Assessment & Plan:   COPD with asthma Severe COPD with asthma overlap. High symptom burden and prone to exacerbations. Slight increase in cough/dyspnea today. Sputum does not seem to be increased or purulent; hold off on abx for now. She had LDCT chest yesterday - waiting on final read but do not see any obvious signs of infection upon review of the images. We will treat her with prednisone taper and initiate low dose daily steroids after completion of taper. She has significantly elevated eosinophils - recommended biologic therapy with dupixent but she has declined this. Discussed inhaled PDE4 therapy with Sharyn Blitz - concerned for cost; suggested submitting paperwork to determine affordability but she denied. Understands risks of chronic steroid use. Continue triple therapy regimen and mucociliary clearance. Action plan in place. Flu vaccine today. Graded exercises encouraged.  Patient Instructions  Continue Breztri 2 puffs Twice daily. Brush tongue and rinse mouth afterwards Continue Albuterol inhaler 2 puffs or 3 mL neb every 6 hours as needed for shortness of breath or wheezing. Notify  if symptoms persist despite  rescue inhaler/neb use. Use nebs at least twice a day before your [place]. Follow with flutter valve ten times  Continue supplemental oxygen 4 lpm with POC with activity and 3 lpm at night. Goal >88-90%. Ok to stay off oxygen at rest as long as oxygen >88-90% Continue Mucinex [phone] mg Twice daily for chest congestion   Prednisone taper. 4 tabs for 2 days, then 3 tabs for 2 days, 2 tabs for 2 days, then 1 tab for 2 days. After you complete your taper, you will start taking 5 mg daily. Take in AM with food.   Flu shot today    Follow up in 4 months with Dr. Tonia Brooms or Florentina Addison Alaura Schippers,NP. If symptoms do not improve or worsen, please contact office for sooner follow up or seek emergency care.   Chronic respiratory failure with hypoxia (HCC) Stable without increased O2 requirement. Goal >88-90%  Lung nodule Previous 3 mm nodule in RLL, unchanged from 2022 to 2023. Follows with the lung cancer screening program now. LDCT chest yesterday, 10/21. Awaiting final results.     I spent 38 minutes of dedicated to the care of this patient on the date of this encounter to include pre-visit review of records, face-to-face time with the patient discussing conditions above, post visit ordering of testing, clinical documentation with the electronic health record, making appropriate referrals as documented, and communicating necessary findings to members of the patients care team.  Noemi Chapel, NP 08/08/2023  Pt aware and understands NP's role.

## 2023-08-08 NOTE — Patient Instructions (Addendum)
Continue Breztri 2 puffs Twice daily. Brush tongue and rinse mouth afterwards Continue Albuterol inhaler 2 puffs or 3 mL neb every 6 hours as needed for shortness of breath or wheezing. Notify if symptoms persist despite rescue inhaler/neb use. Use nebs at least twice a day before your [place]. Follow with flutter valve ten times  Continue supplemental oxygen 4 lpm with POC with activity and 3 lpm at night. Goal >88-90%. Ok to stay off oxygen at rest as long as oxygen >88-90% Continue Mucinex [phone] mg Twice daily for chest congestion   Prednisone taper. 4 tabs for 2 days, then 3 tabs for 2 days, 2 tabs for 2 days, then 1 tab for 2 days. After you complete your taper, you will start taking 5 mg daily. Take in AM with food.   Flu shot today    Follow up in 4 months with Dr. Tonia Brooms or Florentina Addison Julianah Marciel,NP. If symptoms do not improve or worsen, please contact office for sooner follow up or seek emergency care.

## 2023-08-08 NOTE — Assessment & Plan Note (Signed)
Severe COPD with asthma overlap. High symptom burden and prone to exacerbations. Slight increase in cough/dyspnea today. Sputum does not seem to be increased or purulent; hold off on abx for now. She had LDCT chest yesterday - waiting on final read but do not see any obvious signs of infection upon review of the images. We will treat her with prednisone taper and initiate low dose daily steroids after completion of taper. She has significantly elevated eosinophils - recommended biologic therapy with dupixent but she has declined this. Discussed inhaled PDE4 therapy with Sharyn Blitz - concerned for cost; suggested submitting paperwork to determine affordability but she denied. Understands risks of chronic steroid use. Continue triple therapy regimen and mucociliary clearance. Action plan in place. Flu vaccine today. Graded exercises encouraged.  Patient Instructions  Continue Breztri 2 puffs Twice daily. Brush tongue and rinse mouth afterwards Continue Albuterol inhaler 2 puffs or 3 mL neb every 6 hours as needed for shortness of breath or wheezing. Notify if symptoms persist despite rescue inhaler/neb use. Use nebs at least twice a day before your [place]. Follow with flutter valve ten times  Continue supplemental oxygen 4 lpm with POC with activity and 3 lpm at night. Goal >88-90%. Ok to stay off oxygen at rest as long as oxygen >88-90% Continue Mucinex [phone] mg Twice daily for chest congestion   Prednisone taper. 4 tabs for 2 days, then 3 tabs for 2 days, 2 tabs for 2 days, then 1 tab for 2 days. After you complete your taper, you will start taking 5 mg daily. Take in AM with food.   Flu shot today    Follow up in 4 months with Dr. Tonia Brooms or Florentina Addison Jakayla Schweppe,NP. If symptoms do not improve or worsen, please contact office for sooner follow up or seek emergency care.

## 2023-08-10 ENCOUNTER — Other Ambulatory Visit: Payer: Self-pay | Admitting: Internal Medicine

## 2023-08-10 DIAGNOSIS — F411 Generalized anxiety disorder: Secondary | ICD-10-CM

## 2023-08-19 DIAGNOSIS — J441 Chronic obstructive pulmonary disease with (acute) exacerbation: Secondary | ICD-10-CM | POA: Diagnosis not present

## 2023-08-23 ENCOUNTER — Other Ambulatory Visit: Payer: Self-pay | Admitting: Acute Care

## 2023-08-23 DIAGNOSIS — Z87891 Personal history of nicotine dependence: Secondary | ICD-10-CM

## 2023-08-23 DIAGNOSIS — Z122 Encounter for screening for malignant neoplasm of respiratory organs: Secondary | ICD-10-CM

## 2023-08-28 ENCOUNTER — Encounter: Payer: Self-pay | Admitting: Pulmonary Disease

## 2023-08-28 DIAGNOSIS — J439 Emphysema, unspecified: Secondary | ICD-10-CM

## 2023-08-29 MED ORDER — BREZTRI AEROSPHERE 160-9-4.8 MCG/ACT IN AERO: 2.0000 | INHALATION_SPRAY | Freq: Two times a day (BID) | RESPIRATORY_TRACT | 3 refills | Status: DC

## 2023-09-06 DIAGNOSIS — J441 Chronic obstructive pulmonary disease with (acute) exacerbation: Secondary | ICD-10-CM | POA: Diagnosis not present

## 2023-09-18 DIAGNOSIS — J441 Chronic obstructive pulmonary disease with (acute) exacerbation: Secondary | ICD-10-CM | POA: Diagnosis not present

## 2023-10-06 DIAGNOSIS — J441 Chronic obstructive pulmonary disease with (acute) exacerbation: Secondary | ICD-10-CM | POA: Diagnosis not present

## 2023-10-19 DIAGNOSIS — J441 Chronic obstructive pulmonary disease with (acute) exacerbation: Secondary | ICD-10-CM | POA: Diagnosis not present

## 2023-11-03 ENCOUNTER — Encounter: Payer: Self-pay | Admitting: Pulmonary Disease

## 2023-11-06 DIAGNOSIS — J441 Chronic obstructive pulmonary disease with (acute) exacerbation: Secondary | ICD-10-CM | POA: Diagnosis not present

## 2023-11-14 ENCOUNTER — Other Ambulatory Visit: Payer: Self-pay | Admitting: Internal Medicine

## 2023-11-14 DIAGNOSIS — F411 Generalized anxiety disorder: Secondary | ICD-10-CM

## 2023-11-19 DIAGNOSIS — J441 Chronic obstructive pulmonary disease with (acute) exacerbation: Secondary | ICD-10-CM | POA: Diagnosis not present

## 2023-11-21 ENCOUNTER — Encounter (HOSPITAL_COMMUNITY): Payer: Self-pay

## 2023-11-21 ENCOUNTER — Emergency Department (HOSPITAL_COMMUNITY)
Admission: EM | Admit: 2023-11-21 | Discharge: 2023-11-21 | Disposition: A | Discharge status: Home / Self Care | Payer: Medicare Other | Attending: Emergency Medicine | Admitting: Emergency Medicine

## 2023-11-21 ENCOUNTER — Ambulatory Visit: Payer: Self-pay | Admitting: Internal Medicine

## 2023-11-21 ENCOUNTER — Other Ambulatory Visit: Payer: Self-pay

## 2023-11-21 ENCOUNTER — Other Ambulatory Visit: Payer: Self-pay | Admitting: Internal Medicine

## 2023-11-21 ENCOUNTER — Emergency Department (HOSPITAL_COMMUNITY): Payer: Medicare Other

## 2023-11-21 DIAGNOSIS — J449 Chronic obstructive pulmonary disease, unspecified: Secondary | ICD-10-CM | POA: Diagnosis not present

## 2023-11-21 DIAGNOSIS — J441 Chronic obstructive pulmonary disease with (acute) exacerbation: Secondary | ICD-10-CM | POA: Diagnosis not present

## 2023-11-21 DIAGNOSIS — Z20822 Contact with and (suspected) exposure to covid-19: Secondary | ICD-10-CM | POA: Diagnosis not present

## 2023-11-21 DIAGNOSIS — R0602 Shortness of breath: Secondary | ICD-10-CM | POA: Diagnosis not present

## 2023-11-21 DIAGNOSIS — Z7951 Long term (current) use of inhaled steroids: Secondary | ICD-10-CM | POA: Diagnosis not present

## 2023-11-21 LAB — CBC WITH DIFFERENTIAL/PLATELET
Abs Immature Granulocytes: 0.1 10*3/uL — ABNORMAL HIGH (ref 0.00–0.07)
Basophils Absolute: 0.1 10*3/uL (ref 0.0–0.1)
Basophils Relative: 1 %
Eosinophils Absolute: 0.2 10*3/uL (ref 0.0–0.5)
Eosinophils Relative: 3 %
HCT: 40.4 % (ref 36.0–46.0)
Hemoglobin: 12.8 g/dL (ref 12.0–15.0)
Immature Granulocytes: 1 %
Lymphocytes Relative: 26 %
Lymphs Abs: 2.1 10*3/uL (ref 0.7–4.0)
MCH: 30 pg (ref 26.0–34.0)
MCHC: 31.7 g/dL (ref 30.0–36.0)
MCV: 94.6 fL (ref 80.0–100.0)
Monocytes Absolute: 0.5 10*3/uL (ref 0.1–1.0)
Monocytes Relative: 6 %
Neutro Abs: 5.4 10*3/uL (ref 1.7–7.7)
Neutrophils Relative %: 63 %
Platelets: 304 10*3/uL (ref 150–400)
RBC: 4.27 MIL/uL (ref 3.87–5.11)
RDW: 13.9 % (ref 11.5–15.5)
WBC: 8.4 10*3/uL (ref 4.0–10.5)
nRBC: 0 % (ref 0.0–0.2)

## 2023-11-21 LAB — COMPREHENSIVE METABOLIC PANEL
ALT: 24 U/L (ref 0–44)
AST: 32 U/L (ref 15–41)
Albumin: 3.8 g/dL (ref 3.5–5.0)
Alkaline Phosphatase: 79 U/L (ref 38–126)
Anion gap: 9 (ref 5–15)
BUN: 15 mg/dL (ref 8–23)
CO2: 33 mmol/L — ABNORMAL HIGH (ref 22–32)
Calcium: 9.1 mg/dL (ref 8.9–10.3)
Chloride: 96 mmol/L — ABNORMAL LOW (ref 98–111)
Creatinine, Ser: 0.54 mg/dL (ref 0.44–1.00)
GFR, Estimated: >60 mL/min (ref >60)
Glucose, Bld: 94 mg/dL (ref 70–99)
Potassium: 4.7 mmol/L (ref 3.5–5.1)
Sodium: 138 mmol/L (ref 135–145)
Total Bilirubin: 1 mg/dL (ref 0.0–1.2)
Total Protein: 7 g/dL (ref 6.5–8.1)

## 2023-11-21 LAB — RESP PANEL BY RT-PCR (RSV, FLU A&B, COVID)  RVPGX2
Influenza A by PCR: NEGATIVE
Influenza B by PCR: NEGATIVE
Resp Syncytial Virus by PCR: NEGATIVE
SARS Coronavirus 2 by RT PCR: NEGATIVE

## 2023-11-21 MED ORDER — MAGNESIUM SULFATE 2 GM/50ML IV SOLN
2.0000 g | Freq: Once | INTRAVENOUS | Status: AC
  Administered 2023-11-21: 2 g via INTRAVENOUS
  Filled 2023-11-21: qty 50

## 2023-11-21 MED ORDER — METHYLPREDNISOLONE SODIUM SUCC 125 MG IJ SOLR
125.0000 mg | Freq: Once | INTRAMUSCULAR | Status: AC
  Administered 2023-11-21: 125 mg via INTRAVENOUS
  Filled 2023-11-21: qty 2

## 2023-11-21 MED ORDER — ALBUTEROL SULFATE (2.5 MG/3ML) 0.083% IN NEBU
2.5000 mg | INHALATION_SOLUTION | Freq: Once | RESPIRATORY_TRACT | Status: AC
Start: 2023-11-21 — End: 2023-11-21
  Administered 2023-11-21: 2.5 mg via RESPIRATORY_TRACT
  Filled 2023-11-21: qty 3

## 2023-11-21 MED ORDER — PREDNISONE 10 MG PO TABS: 20.0000 mg | ORAL_TABLET | Freq: Every day | ORAL | 0 refills | Status: DC

## 2023-11-21 MED ORDER — IPRATROPIUM-ALBUTEROL 0.5-2.5 (3) MG/3ML IN SOLN
3.0000 mL | Freq: Once | RESPIRATORY_TRACT | Status: AC
Start: 2023-11-21 — End: 2023-11-21
  Administered 2023-11-21: 3 mL via RESPIRATORY_TRACT
  Filled 2023-11-21: qty 3

## 2023-11-21 NOTE — Discharge Instructions (Signed)
Use your inhalers and nebulizer treatments as prescribed.  Follow-up with your doctor next week.  If you have problems prior to you seeing your doctor, then just return back to the ED

## 2023-11-21 NOTE — Telephone Encounter (Signed)
 Chief Complaint: cough Symptoms: cough, SOB with activity, wheezing Frequency: worsening over the last week Pertinent Negatives: Patient denies fever, Disposition: [] ED /[x] Urgent Care (no appt availability in office) / [] Appointment(In office/virtual)/ []  [facility]/ [] Home Care/ [] Refused Recommended Disposition /[] [facility]/ []  Follow-up with PCP Additional Notes: Patient calls reporting worsening productive cough for one week. States she has some wheezing as well, but it clears up after coughing. Per protocol patient to be evaluated within 4 hours. Next available in clinic with any provider 11/22/23 @ 0700. Patient declined to be seen in other clinics, advised Urgent Care would be the next option. Patient unsure if she will go to UC or ED for eval, states she feels she does not get good care at [facility] and does not want to wait at ED. Patient is also requesting refill on ventolin . CAL called, notified Rexene.    Copied from CRM [id number]. Topic: Clinical - Red Word Triage >> Nov 21, 2023 12:11 PM Leotis ORN wrote: [facility] that prompted transfer to Nurse Triage: shortness of breath, wheezing, constant productive cough, negative home Covid test- patient has COPD and feels like she also has bronchitis- called in for an acute visit and med refill visit Reason for Disposition  [1] MILD difficulty breathing (e.g., minimal/no SOB at rest, SOB with walking, pulse <100) AND [2] still present when not coughing  Answer Assessment - Initial Assessment Questions 1. ONSET: When did the cough begin?      Approx 1 week 2. SEVERITY: How bad is the cough today?      Worse with activity, pretty constant 3. SPUTUM: Describe the color of your sputum (none, dry cough; clear, white, yellow, green)     Clear and white 4. HEMOPTYSIS: Are you coughing up any blood? If so ask: How much? (flecks, streaks, tablespoons, etc.)     Denies 5. DIFFICULTY BREATHING: Are you having difficulty  breathing? If Yes, ask: How bad is it? (e.g., mild, moderate, severe)    - MILD: No SOB at rest, mild SOB with walking, speaks normally in sentences, can lie down, no retractions, pulse < 100.    - MODERATE: SOB at rest, SOB with minimal exertion and prefers to sit, cannot lie down flat, speaks in phrases, mild retractions, audible wheezing, pulse 100-120.    - SEVERE: Very SOB at rest, speaks in single words, struggling to breathe, sitting hunched forward, retractions, pulse > 120      Mild- uses o2 concentrator at 3L when out of house, doesn't use all day. 6. FEVER: Do you have a fever? If Yes, ask: What is your temperature, how was it measured, and when did it start?     Denies 7. CARDIAC HISTORY: Do you have any history of heart disease? (e.g., heart attack, congestive heart failure)      Denies 8. LUNG HISTORY: Do you have any history of lung disease?  (e.g., pulmonary embolus, asthma, emphysema)     COPD, states she smoked a pack of cigarettes this past week, emphysema. 9. PE RISK FACTORS: Do you have a history of blood clots? (or: recent major surgery, recent prolonged travel, bedridden)     Denies 10. OTHER SYMPTOMS: Do you have any other symptoms? (e.g., runny nose, wheezing, chest pain)       Wheezing with activity, runny nose in the morning.  12. TRAVEL: Have you traveled out of the country in the last month? (e.g., travel history, exposures)       Denies  Protocols used: Cough - Acute Productive-A-AH

## 2023-11-21 NOTE — ED Provider Triage Note (Signed)
 Emergency Medicine Provider Triage Evaluation Note  Lynn Kline , a 68 y.o. female  was evaluated in triage.  Pt complains of SOB. On 3L at night and when she goes out. Has been worsening over the past several weeks.  Review of Systems  Positive: Shortness of breath, wheezing Negative: CP, fever  Physical Exam  BP 132/82 (BP Location: Left Arm)   Pulse (!) 116   Temp 98.7 F (37.1 C) (Oral)   Resp 20   Ht 5' 2 (1.575 m)   Wt 60.3 kg   SpO2 98%   BMI 24.31 kg/m  Gen:   Awake, no distress   Resp:  Normal effort  MSK:   Moves extremities without difficulty  Other:    Medical Decision Making  Medically screening exam initiated at 2:08 PM.  Appropriate orders placed.  Lynn Kline was informed that the remainder of the evaluation will be completed by another provider, this initial triage assessment does not replace that evaluation, and the importance of remaining in the ED until their evaluation is complete.   Waddell Sluder, PA-C 11/21/23 1409

## 2023-11-21 NOTE — ED Provider Notes (Signed)
 [facility] AT [facility] Provider Note   CSN: [id number] Arrival date & time: 11/21/23  1341     History  Chief Complaint  Patient presents with   Shortness of Breath    Lynn Kline is a 68 y.o. female.  Patient has a history of COPD.  She complains of shortness of breath but no fever mild clear cough.  Patient uses oxygen  at night but recently has been using it during the day  The history is provided by the patient and medical records. No language interpreter was used.  Shortness of Breath Severity:  Moderate Onset quality:  Sudden Timing:  Constant Progression:  Waxing and waning Chronicity:  Recurrent Context: activity   Relieved by:  Nothing Worsened by:  Nothing Ineffective treatments:  None tried Associated symptoms: no abdominal pain, no chest pain, no cough, no headaches and no rash        Home Medications Prior to Admission medications   Medication Sig Start Date End Date Taking? Authorizing Provider  predniSONE  (DELTASONE ) 10 MG tablet Take 2 tablets (20 mg total) by mouth daily. 11/21/23  Yes Maudine Kluesner, MD  albuterol  (PROVENTIL ) (2.5 MG/3ML) 0.083% nebulizer solution USE 3 ML VIA NEBULIZER EVERY 4 HOURS AS NEEDED FOR WHEEZING OR SHORTNESS OF BREATH 06/19/23   [name], MD  ALPRAZolam  (XANAX ) 0.25 MG tablet TAKE 1 TABLET BY MOUTH TWICE DAILY AS NEEDED 11/14/23   Webb, Padonda B, FNP  Budeson-Glycopyrrol-Formoterol  (BREZTRI  AEROSPHERE) 160-9-4.8 MCG/ACT AERO Inhale 2 puffs into the lungs in the morning and at bedtime. 08/29/23   Cobb, Comer GAILS, NP  cyanocobalamin  2000 MCG tablet Take 1 tablet (2,000 mcg total) by mouth daily. 04/13/23   [name], MD  OXYGEN  Inhale 3 L into the lungs at bedtime. 3L at bedtime and prn during day    [provider]  VENTOLIN  HFA 108 (90 Base) MCG/ACT inhaler INHALE 1 TO 2 PUFFS INTO THE LUNGS EVERY 6 HOURS AS NEEDED FOR WHEEZING OR SHORTNESS OF BREATH 08/07/23   [name], MD      Allergies    Codeine, Erythromycin, and Ampicillin    Review of Systems   Review of Systems  Constitutional:  Negative for appetite change and fatigue.  HENT:  Negative for congestion, ear discharge and sinus pressure.   Eyes:  Negative for discharge.  Respiratory:  Positive for shortness of breath. Negative for cough.   Cardiovascular:  Negative for chest pain.  Gastrointestinal:  Negative for abdominal pain and diarrhea.  Genitourinary:  Negative for frequency and hematuria.  Musculoskeletal:  Negative for back pain.  Skin:  Negative for rash.  Neurological:  Negative for seizures and headaches.  Psychiatric/Behavioral:  Negative for hallucinations.     Physical Exam Updated Vital Signs BP 118/70 (BP Location: Left Arm)   Pulse 80   Temp 98.4 F (36.9 C) (Oral)   Resp 17   Ht 5' 2 (1.575 m)   Wt 60.3 kg   SpO2 100%   BMI 24.31 kg/m  Physical Exam Vitals and nursing note reviewed.  Constitutional:      Appearance: She is well-developed.  HENT:     Head: Normocephalic.     Nose: Nose normal.  Eyes:     General: No scleral icterus.    Conjunctiva/sclera: Conjunctivae normal.  Neck:     Thyroid : No thyromegaly.  Cardiovascular:     Rate and Rhythm: Normal rate and regular rhythm.  Heart sounds: No murmur heard.    No friction rub. No gallop.  Pulmonary:     Breath sounds: No stridor. No wheezing or rales.  Chest:     Chest wall: No tenderness.  Abdominal:     General: There is no distension.     Tenderness: There is no abdominal tenderness. There is no rebound.  Musculoskeletal:        General: Normal range of motion.     Cervical back: Neck supple.  Lymphadenopathy:     Cervical: No cervical adenopathy.  Skin:    Findings: No erythema or rash.  Neurological:     Mental Status: She is alert and oriented to person, place, and time.     Motor: No abnormal muscle tone.     Coordination: Coordination normal.  Psychiatric:         Behavior: Behavior normal.     ED Results / Procedures / Treatments   Labs (all labs ordered are listed, but only abnormal results are displayed) Labs Reviewed  CBC WITH DIFFERENTIAL/PLATELET - Abnormal; Notable for the following components:      Result Value   Abs Immature Granulocytes 0.10 (*)    All other components within normal limits  COMPREHENSIVE METABOLIC PANEL - Abnormal; Notable for the following components:   Chloride 96 (*)    CO2 33 (*)    All other components within normal limits  RESP PANEL BY RT-PCR (RSV, FLU A&B, COVID)  RVPGX2    EKG None  Radiology DG Chest 2 View Result Date: 11/21/2023 CLINICAL DATA:  Shortness of breath. EXAM: CHEST - 2 VIEW COMPARISON:  05/04/2023. FINDINGS: Bilateral lungs appear hyperexpanded and hyperlucent with coarse bronchovascular markings, in keeping with COPD. There is focal scarring in the left lung apex, similar to the prior study. Bilateral lungs otherwise appear clear. No dense consolidation. Bilateral lung fields are clear. Bilateral costophrenic angles are clear. Normal cardio-mediastinal silhouette. No acute osseous abnormalities. The soft tissues are within normal limits. IMPRESSION: No active cardiopulmonary disease.  COPD. Electronically Signed   By: Ree Molt M.D.   On: 11/21/2023 15:43    Procedures Procedures    Medications Ordered in ED Medications  methylPREDNISolone  sodium succinate (SOLU-MEDROL ) 125 mg/2 mL injection 125 mg (125 mg Intravenous Given 11/21/23 1549)  magnesium  sulfate IVPB 2 g 50 mL (0 g Intravenous Stopped 11/21/23 1650)  ipratropium-albuterol  (DUONEB) 0.5-2.5 (3) MG/3ML nebulizer solution 3 mL (3 mLs Nebulization Given 11/21/23 1550)  albuterol  (PROVENTIL ) (2.5 MG/3ML) 0.083% nebulizer solution 2.5 mg (2.5 mg Nebulization Given 11/21/23 1550)    ED Course/ Medical Decision Making/ A&P  Patient was given a neb treatment and feels better.  She no longer has wheezing and is not tachycardic.                                Medical Decision Making Amount and/or Complexity of Data Reviewed Labs: ordered.  Risk Prescription drug management.   Patient with COPD exacerbation.  She is placed on prednisone  and will follow-up with her doctor        Final Clinical Impression(s) / ED Diagnoses Final diagnoses:  COPD exacerbation (HCC)    Rx / DC Orders ED Discharge Orders          Ordered    predniSONE  (DELTASONE ) 10 MG tablet  Daily        11/21/23 1854  Suzette Pac, MD 11/22/23 [phone]

## 2023-11-21 NOTE — ED Triage Notes (Signed)
SOB for a week. Pt states she has emphysema and smoked a cigarette after quitting and it flared up. Pt wears 3L [place] at bedtime. 88% on RA on arrival. Pt has shallow, labored breathing. Dyspnea on exertion. Intermittent right sided chest pain

## 2023-11-22 ENCOUNTER — Other Ambulatory Visit: Payer: Self-pay

## 2023-11-22 ENCOUNTER — Telehealth: Payer: Self-pay | Admitting: Internal Medicine

## 2023-11-22 DIAGNOSIS — J441 Chronic obstructive pulmonary disease with (acute) exacerbation: Secondary | ICD-10-CM

## 2023-11-22 MED ORDER — VENTOLIN HFA 108 (90 BASE) MCG/ACT IN AERS: INHALATION_SPRAY | RESPIRATORY_TRACT | 0 refills | Status: DC

## 2023-11-22 NOTE — Telephone Encounter (Signed)
 Copied from CRM [id number]. Topic: Clinical - Prescription Issue >> Nov 22, 2023 12:01 PM Robinson H wrote: Reason for CRM: Patient has a refill request for her VENTOLIN  HFA 108 (90 Base) MCG/ACT inhaler [Pharmacy Med Name: VENTOLIN  HFA INH W/DOS CTR 200PUFFS]  that was denied due to patient needing an appointment. Patient scheduled a hospital follow up for breathing issues with Dr. Joshua for 2/12 at 2:00 pm. Patient would like a refill to hold her over until she comes in.

## 2023-11-29 ENCOUNTER — Ambulatory Visit: Payer: Medicare Other | Admitting: Internal Medicine

## 2023-11-29 ENCOUNTER — Encounter: Payer: Self-pay | Admitting: Internal Medicine

## 2023-11-29 VITALS — BP 140/86 | HR 97 | Temp 97.6°F | Ht 62.0 in | Wt 134.8 lb

## 2023-11-29 DIAGNOSIS — Z0000 Encounter for general adult medical examination without abnormal findings: Secondary | ICD-10-CM | POA: Diagnosis not present

## 2023-11-29 DIAGNOSIS — Z0001 Encounter for general adult medical examination with abnormal findings: Secondary | ICD-10-CM

## 2023-11-29 DIAGNOSIS — E785 Hyperlipidemia, unspecified: Secondary | ICD-10-CM | POA: Diagnosis not present

## 2023-11-29 DIAGNOSIS — Z23 Encounter for immunization: Secondary | ICD-10-CM

## 2023-11-29 DIAGNOSIS — R7303 Prediabetes: Secondary | ICD-10-CM

## 2023-11-29 DIAGNOSIS — J441 Chronic obstructive pulmonary disease with (acute) exacerbation: Secondary | ICD-10-CM

## 2023-11-29 DIAGNOSIS — J9611 Chronic respiratory failure with hypoxia: Secondary | ICD-10-CM | POA: Diagnosis not present

## 2023-11-29 DIAGNOSIS — J4489 Other specified chronic obstructive pulmonary disease: Secondary | ICD-10-CM

## 2023-11-29 DIAGNOSIS — I700 Atherosclerosis of aorta: Secondary | ICD-10-CM

## 2023-11-29 DIAGNOSIS — F411 Generalized anxiety disorder: Secondary | ICD-10-CM

## 2023-11-29 LAB — LIPID PANEL
Cholesterol: 206 mg/dL — ABNORMAL HIGH (ref 0–200)
HDL: 89.6 mg/dL (ref >39.00)
LDL Cholesterol: 99 mg/dL (ref 0–99)
NonHDL: 116.38
Total CHOL/HDL Ratio: 2
Triglycerides: 88 mg/dL (ref 0.0–149.0)
VLDL: 17.6 mg/dL (ref 0.0–40.0)

## 2023-11-29 LAB — HEMOGLOBIN A1C: Hgb A1c MFr Bld: 5.8 % (ref 4.6–6.5)

## 2023-11-29 MED ORDER — ALPRAZOLAM 0.25 MG PO TABS: 0.2500 mg | ORAL_TABLET | Freq: Two times a day (BID) | ORAL | 2 refills | Status: DC | PRN

## 2023-11-29 MED ORDER — VENTOLIN HFA 108 (90 BASE) MCG/ACT IN AERS: 2.0000 | INHALATION_SPRAY | Freq: Four times a day (QID) | RESPIRATORY_TRACT | 3 refills | Status: DC | PRN

## 2023-11-29 MED ORDER — SHINGRIX 50 MCG/0.5ML IM SUSR: 0.5000 mL | Freq: Once | INTRAMUSCULAR | 1 refills | Status: AC

## 2023-11-29 MED ORDER — BOOSTRIX 5-2.5-18.5 LF-MCG/0.5 IM SUSP: 0.5000 mL | Freq: Once | INTRAMUSCULAR | 0 refills | Status: AC

## 2023-11-29 NOTE — Patient Instructions (Signed)

## 2023-11-29 NOTE — Progress Notes (Signed)
 Subjective:  Patient ID: Lynn Kline, female    DOB: [birth year]  Age: 68 y.o. MRN: [id number]  CC: Annual Exam, COPD, Hypertension, and Hyperlipidemia   HPI Lynn Kline presents for a CPX and f/up ----  Discussed the use of AI scribe software for clinical note transcription with the patient, who gave verbal consent to proceed.  History of Present Illness   Lynn Kline is a 68 year old female with chronic respiratory issues who presents with worsening shortness of breath and cough.  Over the past three weeks, she has experienced worsening shortness of breath and a persistent cough with difficulty expectorating clear, liquidy phlegm. She describes a sensation of congestion and experiences significant dyspnea with minimal exertion, such as walking to the elevator. Her oxygen saturation drops into the 80s with activity but remains at 92% at rest. She has no fever, chills, or colored sputum. There is soreness from coughing but no hemoptysis.  She recently resumed smoking cigarettes, which she believes has exacerbated her symptoms. Last week, she visited the emergency room due to aspiration and received magnesium sulfate and a breathing treatment. She declined hospital admission at that time. She has a history of adverse reactions to Zithromax and prefers to avoid it.  She uses oxygen at night and has a portable oxygen concentrator for outings, although she does not always use it during the day. She recently completed a course of prednisone with minimal improvement in her symptoms. Her current medications include Breztri twice daily, albuterol nebulizer treatments, and she tries to limit her use of rescue inhalers. She is prescribed three liters of oxygen at night and four liters during the day when using her portable concentrator.  She works from home and attempts to stay active, but her symptoms are limiting.       Outpatient Medications Prior to Visit  Medication Sig Dispense  Refill   Budeson-Glycopyrrol-Formoterol (BREZTRI AEROSPHERE) 160-9-4.8 MCG/ACT AERO Inhale 2 puffs into the lungs in the morning and at bedtime. 3 each 3   cyanocobalamin 2000 MCG tablet Take 1 tablet (2,000 mcg total) by mouth daily. 90 tablet 1   OXYGEN Inhale 3 L into the lungs at bedtime. 3L at bedtime and prn during day     predniSONE (DELTASONE) 10 MG tablet Take 2 tablets (20 mg total) by mouth daily. 15 tablet 0   ALPRAZolam (XANAX) 0.25 MG tablet TAKE 1 TABLET BY MOUTH TWICE DAILY AS NEEDED 60 tablet 1   VENTOLIN HFA 108 (90 Base) MCG/ACT inhaler INHALE 1 TO 2 PUFFS INTO THE LUNGS EVERY 6 HOURS AS NEEDED FOR WHEEZING OR SHORTNESS OF BREATH 18 g 0   No facility-administered medications prior to visit.    ROS Review of Systems  Constitutional: Negative.  Negative for chills, fatigue and fever.  HENT: Negative.    Eyes:  Negative for visual disturbance.  Respiratory:  Positive for cough, shortness of breath and wheezing. Negative for chest tightness.   Cardiovascular:  Negative for chest pain, palpitations and leg swelling.  Gastrointestinal:  Negative for abdominal pain, constipation, diarrhea, nausea and vomiting.  Endocrine: Negative.   Genitourinary: Negative.  Negative for difficulty urinating.  Musculoskeletal: Negative.  Negative for arthralgias and myalgias.  Skin: Negative.  Negative for color change and rash.  Neurological:  Negative for dizziness and weakness.  Hematological:  Negative for adenopathy. Does not bruise/bleed easily.  Psychiatric/Behavioral:  Negative for behavioral problems, confusion, decreased concentration, sleep disturbance and suicidal ideas. The patient is nervous/anxious.  Objective:  BP (!) 140/86 (BP Location: Left Arm, Patient Position: Sitting, Cuff Size: Normal)   Pulse 97   Temp 97.6 F (36.4 C) (Oral)   Ht 5\' 2"  (1.575 m)   Wt 134 lb 12.8 oz (61.1 kg)   SpO2 92%   BMI 24.66 kg/m   BP Readings from Last 3 Encounters:  11/29/23  (!) 140/86  11/21/23 118/67  08/08/23 122/78    Wt Readings from Last 3 Encounters:  11/29/23 134 lb 12.8 oz (61.1 kg)  11/21/23 132 lb 15 oz (60.3 kg)  08/08/23 133 lb (60.3 kg)    Physical Exam Vitals reviewed.  Constitutional:      General: She is not in acute distress.    Appearance: She is ill-appearing. She is not toxic-appearing or diaphoretic.  HENT:     Nose: Nose normal.     Mouth/Throat:     Mouth: Mucous membranes are moist.  Eyes:     General: No scleral icterus.    Pupils: Pupils are equal, round, and reactive to light.  Cardiovascular:     Rate and Rhythm: Normal rate and regular rhythm.     Heart sounds: No murmur heard.    No friction rub. No gallop.  Pulmonary:     Effort: No respiratory distress.     Breath sounds: No stridor. Examination of the right-upper field reveals wheezing and rhonchi. Examination of the left-upper field reveals wheezing and rhonchi. Examination of the right-middle field reveals wheezing and rhonchi. Examination of the left-middle field reveals wheezing and rhonchi. Examination of the right-lower field reveals wheezing and rhonchi. Examination of the left-lower field reveals wheezing and rhonchi. Wheezing and rhonchi present. No decreased breath sounds or rales.  Chest:     Chest wall: No tenderness.  Abdominal:     General: Abdomen is flat.     Palpations: There is no mass.     Tenderness: There is no abdominal tenderness. There is no guarding.     Hernia: No hernia is present.  Musculoskeletal:        General: Normal range of motion.     Cervical back: Neck supple.     Right lower leg: No edema.     Left lower leg: No edema.  Lymphadenopathy:     Cervical: No cervical adenopathy.  Skin:    General: Skin is warm and dry.  Neurological:     General: No focal deficit present.     Mental Status: She is alert. Mental status is at baseline.  Psychiatric:        Mood and Affect: Mood normal.        Behavior: Behavior normal.      Lab Results  Component Value Date   WBC 8.4 11/21/2023   HGB 12.8 11/21/2023   HCT 40.4 11/21/2023   PLT 304 11/21/2023   GLUCOSE 94 11/21/2023   CHOL 206 (H) 11/29/2023   TRIG 88.0 11/29/2023   HDL 89.60 11/29/2023   LDLDIRECT 141.0 11/15/2011   LDLCALC 99 11/29/2023   ALT 24 11/21/2023   AST 32 11/21/2023   NA 138 11/21/2023   K 4.7 11/21/2023   CL 96 (L) 11/21/2023   CREATININE 0.54 11/21/2023   BUN 15 11/21/2023   CO2 33 (H) 11/21/2023   TSH 0.59 04/14/2023   INR 0.8 04/14/2023   HGBA1C 5.8 11/29/2023    DG Chest 2 View Result Date: 11/21/2023 CLINICAL DATA:  Shortness of breath. EXAM: CHEST - 2 VIEW COMPARISON:  05/04/2023. FINDINGS:  Bilateral lungs appear hyperexpanded and hyperlucent with coarse bronchovascular markings, in keeping with COPD. There is focal scarring in the left lung apex, similar to the prior study. Bilateral lungs otherwise appear clear. No dense consolidation. Bilateral lung fields are clear. Bilateral costophrenic angles are clear. Normal cardio-mediastinal silhouette. No acute osseous abnormalities. The soft tissues are within normal limits. IMPRESSION: No active cardiopulmonary disease.  COPD. Electronically Signed   By: Jules Schick M.D.   On: 11/21/2023 15:43    Assessment & Plan:   Chronic respiratory failure with hypoxia (HCC)- She has exertional hypoxemia.  I recommended that she change her oxygen to continuous use.  Prediabetes -     Hemoglobin A1c; Future  Atherosclerosis of aorta (HCC)- Statin is not indicated. -     Lipid panel; Future  Hyperlipidemia with target LDL less than 130 -     Lipid panel; Future  GAD (generalized anxiety disorder) -     ALPRAZolam; Take 1 tablet (0.25 mg total) by mouth 2 (two) times daily as needed.  Dispense: 60 tablet; Refill: 2  Chronic obstructive pulmonary disease with acute exacerbation (HCC) -     Ventolin HFA; Inhale 2 puffs into the lungs every 6 (six) hours as needed for wheezing or  shortness of breath. INHALE 1 TO 2 PUFFS INTO THE LUNGS EVERY 6 HOURS AS NEEDED FOR WHEEZING OR SHORTNESS OF BREATH  Dispense: 18 g; Refill: 3  Encounter for general adult medical examination with abnormal findings- Exam completed, labs reviewed, vaccines reviewed and updated, cancer screenings addressed, pt ed material was given.   Need for prophylactic vaccination with combined diphtheria-tetanus-pertussis (DTP) vaccine -     Boostrix; Inject 0.5 mLs into the muscle once for 1 dose.  Dispense: 0.5 mL; Refill: 0  Need for prophylactic vaccination and inoculation against varicella -     Shingrix; Inject 0.5 mLs into the muscle once for 1 dose.  Dispense: 0.5 mL; Refill: 1  COPD with asthma (HCC)- She wants to continue breztri.     Follow-up: Return in about 4 months (around 03/28/2024).  [name], MD

## 2023-12-05 ENCOUNTER — Encounter: Payer: Self-pay | Admitting: Internal Medicine

## 2023-12-07 DIAGNOSIS — J441 Chronic obstructive pulmonary disease with (acute) exacerbation: Secondary | ICD-10-CM | POA: Diagnosis not present

## 2023-12-11 ENCOUNTER — Ambulatory Visit: Payer: Medicare Other | Admitting: Nurse Practitioner

## 2023-12-17 DIAGNOSIS — J441 Chronic obstructive pulmonary disease with (acute) exacerbation: Secondary | ICD-10-CM | POA: Diagnosis not present

## 2024-01-04 DIAGNOSIS — J441 Chronic obstructive pulmonary disease with (acute) exacerbation: Secondary | ICD-10-CM | POA: Diagnosis not present

## 2024-01-29 ENCOUNTER — Emergency Department (HOSPITAL_COMMUNITY)

## 2024-01-29 ENCOUNTER — Emergency Department (HOSPITAL_COMMUNITY)
Admission: EM | Admit: 2024-01-29 | Discharge: 2024-01-29 | Disposition: A | Discharge status: Home / Self Care | Attending: Emergency Medicine | Admitting: Emergency Medicine

## 2024-01-29 DIAGNOSIS — R0602 Shortness of breath: Secondary | ICD-10-CM | POA: Diagnosis not present

## 2024-01-29 DIAGNOSIS — J441 Chronic obstructive pulmonary disease with (acute) exacerbation: Secondary | ICD-10-CM | POA: Diagnosis not present

## 2024-01-29 DIAGNOSIS — R059 Cough, unspecified: Secondary | ICD-10-CM | POA: Diagnosis not present

## 2024-01-29 DIAGNOSIS — J929 Pleural plaque without asbestos: Secondary | ICD-10-CM | POA: Diagnosis not present

## 2024-01-29 DIAGNOSIS — R918 Other nonspecific abnormal finding of lung field: Secondary | ICD-10-CM | POA: Diagnosis not present

## 2024-01-29 LAB — CBC
HCT: 39.9 % (ref 36.0–46.0)
Hemoglobin: 13.1 g/dL (ref 12.0–15.0)
MCH: 31 pg (ref 26.0–34.0)
MCHC: 32.8 g/dL (ref 30.0–36.0)
MCV: 94.3 fL (ref 80.0–100.0)
Platelets: 308 10*3/uL (ref 150–400)
RBC: 4.23 MIL/uL (ref 3.87–5.11)
RDW: 13.2 % (ref 11.5–15.5)
WBC: 7.3 10*3/uL (ref 4.0–10.5)
nRBC: 0 % (ref 0.0–0.2)

## 2024-01-29 LAB — BASIC METABOLIC PANEL WITH GFR
Anion gap: 8 (ref 5–15)
BUN: 19 mg/dL (ref 8–23)
CO2: 31 mmol/L (ref 22–32)
Calcium: 9 mg/dL (ref 8.9–10.3)
Chloride: 97 mmol/L — ABNORMAL LOW (ref 98–111)
Creatinine, Ser: 0.59 mg/dL (ref 0.44–1.00)
GFR, Estimated: >60 mL/min (ref >60)
Glucose, Bld: 101 mg/dL — ABNORMAL HIGH (ref 70–99)
Potassium: 3.8 mmol/L (ref 3.5–5.1)
Sodium: 136 mmol/L (ref 135–145)

## 2024-01-29 LAB — BRAIN NATRIURETIC PEPTIDE: B Natriuretic Peptide: 37.8 pg/mL (ref 0.0–100.0)

## 2024-01-29 LAB — RESP PANEL BY RT-PCR (RSV, FLU A&B, COVID)  RVPGX2
Influenza A by PCR: NEGATIVE
Influenza B by PCR: NEGATIVE
Resp Syncytial Virus by PCR: NEGATIVE
SARS Coronavirus 2 by RT PCR: NEGATIVE

## 2024-01-29 LAB — TROPONIN I (HIGH SENSITIVITY): Troponin I (High Sensitivity): 3 ng/L (ref <18)

## 2024-01-29 MED ORDER — DOXYCYCLINE HYCLATE 100 MG PO CAPS: 100.0000 mg | ORAL_CAPSULE | Freq: Two times a day (BID) | ORAL | 0 refills | Status: DC

## 2024-01-29 MED ORDER — IPRATROPIUM-ALBUTEROL 0.5-2.5 (3) MG/3ML IN SOLN
3.0000 mL | Freq: Once | RESPIRATORY_TRACT | Status: AC
  Administered 2024-01-29: 3 mL via RESPIRATORY_TRACT
  Filled 2024-01-29: qty 3

## 2024-01-29 MED ORDER — ALBUTEROL SULFATE (2.5 MG/3ML) 0.083% IN NEBU
2.5000 mg | INHALATION_SOLUTION | Freq: Once | RESPIRATORY_TRACT | Status: AC
  Administered 2024-01-29: 2.5 mg via RESPIRATORY_TRACT
  Filled 2024-01-29: qty 3

## 2024-01-29 MED ORDER — METHYLPREDNISOLONE SODIUM SUCC 125 MG IJ SOLR
125.0000 mg | Freq: Once | INTRAMUSCULAR | Status: AC
  Administered 2024-01-29: 125 mg via INTRAVENOUS
  Filled 2024-01-29: qty 2

## 2024-01-29 MED ORDER — ALBUTEROL SULFATE HFA 108 (90 BASE) MCG/ACT IN AERS
2.0000 | INHALATION_SPRAY | RESPIRATORY_TRACT | Status: DC | PRN
  Administered 2024-01-29: 2 via RESPIRATORY_TRACT
  Filled 2024-01-29: qty 6.7

## 2024-01-29 MED ORDER — ALPRAZOLAM 0.25 MG PO TABS
0.2500 mg | ORAL_TABLET | Freq: Once | ORAL | Status: AC
  Administered 2024-01-29: 0.25 mg via ORAL
  Filled 2024-01-29: qty 1

## 2024-01-29 NOTE — ED Provider Triage Note (Signed)
 Emergency Medicine Provider Triage Evaluation Note  Lynn Kline , a 68 y.o. female  was evaluated in triage.  Pt complains of shortness of breath.  Patient endorses shortness of breath and pain in the center of her chest for the past 2 to 3 days.  Symptoms are worse with ambulation.  She is on 3 L oxygen at baseline and during her last hospitalization.  History of smoking and COPD.  Has been using her inhalers and nebulizers at home without improvement of symptoms.  No fevers.  No recent hospitalizations, surgeries, long travels, leg pain.  Patient reports intermittent leg swelling at baseline.  Review of Systems  Positive: Shortness of breath, chest pain Negative: Some swelling present lower extremities bilaterally  Physical Exam  Pulse 96   SpO2 95%  Gen:   Awake, no distress   Resp:  Normal effort  MSK:   Moves extremities without difficulty  Other:    Medical Decision Making  Medically screening exam initiated at 8:35 PM.  Appropriate orders placed.  Lynn Kline was informed that the remainder of the evaluation will be completed by another provider, this initial triage assessment does not replace that evaluation, and the importance of remaining in the ED until their evaluation is complete.   Lynn Dusky, PA-C 01/29/24 2037

## 2024-01-29 NOTE — Discharge Instructions (Signed)
 As discussed, your workup today was reassuring.  Suspect your symptoms likely secondary to COPD flareup.  Recommend prednisone at home 40 mg for the next 6 days.  Also placed on antibiotics in the form of doxycycline.  Continue to use breathing treatments at home as prescribed.  Recommend follow-up with your pulmonologist for reassessment.  Please do not hesitate to return if the worrisome signs and symptoms we discussed become apparent.

## 2024-01-29 NOTE — ED Triage Notes (Signed)
 Patient is complaining of shortness of breath and non productive cough x  2 days. Patients breathing is labored at triage. Patient has COPD and is on oxygen 2 liters 24/7. Patient states with her oxygen and meds it is worsening and unable to catch her breath. O2 sat 95% RA, she left her oxygen at home.

## 2024-01-29 NOTE — ED Provider Notes (Signed)
 [place] [facility]MENT AT [facility] Provider Note   CSN: [id number] Arrival date & time: 01/29/24  4098     History  No chief complaint on file.   Lynn Kline is a 68 y.o. female.  HPI   68 year old female presents emergency department complaints of shortness of breath.  Has felt short of breath for the past 3 days or so.  Is on chronic O2 secondary to chronic respiratory failure with hypoxia secondary to COPD.  Wears 3 L when at rest with increased to 5 L with exertion.  Reports increased cough over the past same amount of time.  Denies fever, chills, abdominal pain, nausea vomiting, urinary symptoms, change in bowel habits.  Does report central chest tightness.  Has been using inhalers at home which has helped temporarily with her symptoms.  Past medical history significant for COPD, pneumonia, IBS, hyperlipidemia, CAD, GAD, chronic respiratory failure with hypoxia  Home Medications Prior to Admission medications   Medication Sig Start Date End Date Taking? Authorizing Provider  albuterol (PROVENTIL) (2.5 MG/3ML) 0.083% nebulizer solution Take 2.5 mg by nebulization every 6 (six) hours as needed for wheezing or shortness of breath.   Yes [provider]  ALPRAZolam (XANAX) 0.25 MG tablet Take 1 tablet (0.25 mg total) by mouth 2 (two) times daily as needed. 11/29/23  Yes Etta Grandchild, MD  Budeson-Glycopyrrol-Formoterol (BREZTRI AEROSPHERE) 160-9-4.8 MCG/ACT AERO Inhale 2 puffs into the lungs in the morning and at bedtime. 08/29/23  Yes Cobb, Ruby Cola, NP  cholecalciferol (VITAMIN D3) 25 MCG (1000 UNIT) tablet Take 1,000 Units by mouth daily.   Yes [provider]  doxycycline (VIBRAMYCIN) 100 MG capsule Take 1 capsule (100 mg total) by mouth 2 (two) times daily. 01/29/24  Yes Sherian Maroon A, PA  naproxen sodium (ALEVE) 220 MG tablet Take 220 mg by mouth 2 (two) times daily as needed.   Yes [provider]  VENTOLIN HFA 108 (90  Base) MCG/ACT inhaler Inhale 2 puffs into the lungs every 6 (six) hours as needed for wheezing or shortness of breath. INHALE 1 TO 2 PUFFS INTO THE LUNGS EVERY 6 HOURS AS NEEDED FOR WHEEZING OR SHORTNESS OF BREATH 11/29/23  Yes Etta Grandchild, MD  OXYGEN Inhale 3 L into the lungs at bedtime. 3L at bedtime and prn during day    [provider]      Allergies    Codeine, Erythromycin, and Ampicillin    Review of Systems   Review of Systems  All other systems reviewed and are negative.   Physical Exam Updated Vital Signs BP (!) 125/91 (BP Location: Left Arm)   Pulse 97   Temp 97.7 F (36.5 C) (Oral)   Resp 20   SpO2 94%  Physical Exam Vitals and nursing note reviewed.  Constitutional:      General: She is not in acute distress.    Appearance: She is well-developed.  HENT:     Head: Normocephalic and atraumatic.  Eyes:     Conjunctiva/sclera: Conjunctivae normal.  Cardiovascular:     Rate and Rhythm: Normal rate and regular rhythm.  Pulmonary:     Effort: Pulmonary effort is normal. No respiratory distress.     Breath sounds: Wheezing present.  Abdominal:     Palpations: Abdomen is soft.     Tenderness: There is no abdominal tenderness.  Musculoskeletal:        General: No swelling.     Cervical back: Neck supple.  Comments: 1+ pitting edema bilateral lower extremities.  Skin:    General: Skin is warm and dry.     Capillary Refill: Capillary refill takes less than 2 seconds.  Neurological:     Mental Status: She is alert.  Psychiatric:        Mood and Affect: Mood normal.     ED Results / Procedures / Treatments   Labs (all labs ordered are listed, but only abnormal results are displayed) Labs Reviewed  BASIC METABOLIC PANEL WITH GFR - Abnormal; Notable for the following components:      Result Value   Chloride 97 (*)    Glucose, Bld 101 (*)    All other components within normal limits  RESP PANEL BY RT-PCR (RSV, FLU A&B, COVID)  RVPGX2  CBC   BRAIN NATRIURETIC PEPTIDE  TROPONIN I (HIGH SENSITIVITY)    EKG None  Radiology DG Chest 2 View Result Date: 01/29/2024 CLINICAL DATA:  SOB chronic cough EXAM: CHEST - 2 VIEW COMPARISON:  11/21/2023 and 07/12/2021. FINDINGS: The heart size and mediastinal contours are within normal limits. Lungs are hyperinflated suggesting COPD. Linear opacities left apex with pleural thickening consistent with scarring which appears unchanged. There is no focal consolidation. No pneumothorax or pleural effusion. Midthoracic compression deformity is stable. IMPRESSION: Findings suggest COPD. Left apical scarring. No acute cardiopulmonary process. Electronically Signed   By: Layla Maw M.D.   On: 01/29/2024 21:46    Procedures Procedures    Medications Ordered in ED Medications  albuterol (VENTOLIN HFA) 108 (90 Base) MCG/ACT inhaler 2 puff (2 puffs Inhalation Given 01/29/24 2105)  ipratropium-albuterol (DUONEB) 0.5-2.5 (3) MG/3ML nebulizer solution 3 mL (3 mLs Nebulization Given 01/29/24 2105)  methylPREDNISolone sodium succinate (SOLU-MEDROL) 125 mg/2 mL injection 125 mg (125 mg Intravenous Given 01/29/24 2155)  albuterol (PROVENTIL) (2.5 MG/3ML) 0.083% nebulizer solution 2.5 mg (2.5 mg Nebulization Given 01/29/24 2151)  ALPRAZolam Prudy Feeler) tablet 0.25 mg (0.25 mg Oral Given 01/29/24 2301)    ED Course/ Medical Decision Making/ A&P                                 Medical Decision Making Amount and/or Complexity of Data Reviewed Labs: ordered. Radiology: ordered.  Risk Prescription drug management.   This patient presents to the ED for concern of shortness of breath, this involves an extensive number of treatment options, and is a complaint that carries with it a high risk of complications and morbidity.  The differential diagnosis includes ACS, PE, pneumothorax, pneumonia, COVID, flu, RSV, anemia, other   Co morbidities that complicate the patient evaluation  See HPI   Additional  history obtained:  Additional history obtained from EMR External records from outside source obtained and reviewed including hospital records   Lab Tests:  I Ordered, and personally interpreted labs.  The pertinent results include: No leukocytosis.  No evidence of anemia.  Lites within range.  Mild hypochloremia 97 otherwise, S within normal limits.  Renal dysfunction.  Troponin of 3.  BNP of 37.8.  Viral testing negative   Imaging Studies ordered:  I ordered imaging studies including chest x-ray I independently visualized and interpreted imaging which showed COPD.  Left apical scarring.  No acute cardiopulmonary abnormality. I agree with the radiologist interpretation   Cardiac Monitoring: / EKG:  The patient was maintained on a cardiac monitor.  I personally viewed and interpreted the cardiac monitored which showed an underlying rhythm of: Sinus  rhythm.  Prolonged shortened PR interval.  Right atrial lodgment.  Consultations Obtained:  N/a   Problem List / ED Course / Critical interventions / Medication management  COPD exacerbation I ordered medication including albuterol, DuoNeb, Solu-Medrol, alprazolam   Reevaluation of the patient after these medicines showed that the patient improved I have reviewed the patients home medicines and have made adjustments as needed   Social Determinants of Health:  Former cigarette use.  Denies illicit drug use.   Test / Admission - Considered:  COPD exacerbation Vitals signs within normal range and stable throughout visit. Laboratory/imaging studies significant for: See above 68 year old female presents emergency department complaints of shortness of breath.  Has felt short of breath for the past 3 days or so.  Is on chronic O2 secondary to chronic respiratory failure with hypoxia secondary to COPD.  Wears 3 L when at rest with increased to 5 L with exertion.  Reports increased cough over the past same amount of time.  Denies fever,  chills, abdominal pain, nausea vomiting, urinary symptoms, change in bowel habits.  Does report central chest tightness.  Has been using inhalers at home which has helped temporarily with her symptoms. On exam, wheezing appreciated bilateral lung fields.  Patient with normal O2 sats on her 3 L nasal cannula.  Chest x-ray without evidence of pneumonia or other acute cardiopulmonary abnormality.  Patient with negative troponin, lack of acute ischemic change on EKG; low suspicion for ACS.  BNP negative; low suspicion for CHF.  Suspect patient's symptoms likely secondary to COPD exacerbation.  Treated with nebulized breathing treatment, Solu-Medrol with improvement of symptoms.  Subsequently ambulated with pulse ox with no desaturation on her at home O2.  Will treat COPD exacerbation in the outpatient setting with continued corticosteroids, azithromycin and recommend follow-up with pulmonology.  Treatment plan discussed with patient and she acknowledged understanding was agreeable to said plan.  Patient overall well-appearing, afebrile in no acute distress. Worrisome signs and symptoms were discussed with the patient, and the patient acknowledged understanding to return to the ED if noticed. Patient was stable upon discharge.          Final Clinical Impression(s) / ED Diagnoses Final diagnoses:  COPD exacerbation (HCC)    Rx / DC Orders ED Discharge Orders          Ordered    doxycycline (VIBRAMYCIN) 100 MG capsule  2 times daily        01/29/24 2334              Singac Butter, [place] 01/29/24 1610    Scarlette Currier, MD 01/30/24 1339

## 2024-02-05 ENCOUNTER — Encounter: Payer: Self-pay | Admitting: Emergency Medicine

## 2024-02-05 ENCOUNTER — Ambulatory Visit (INDEPENDENT_AMBULATORY_CARE_PROVIDER_SITE_OTHER): Admitting: Emergency Medicine

## 2024-02-05 VITALS — BP 126/74 | HR 95 | Temp 98.0°F | Ht 62.0 in | Wt 130.2 lb

## 2024-02-05 DIAGNOSIS — J4489 Other specified chronic obstructive pulmonary disease: Secondary | ICD-10-CM

## 2024-02-05 DIAGNOSIS — Z09 Encounter for follow-up examination after completed treatment for conditions other than malignant neoplasm: Secondary | ICD-10-CM

## 2024-02-05 NOTE — Patient Instructions (Signed)

## 2024-02-05 NOTE — Assessment & Plan Note (Signed)
 Status post recent exacerbation Oxygen  dependent 24/7 Continue Breztri  2 puffs twice a day with albuterol  as rescue inhalers Needs to follow-up with pulmonary doctor as well as PCP. Much better today than 1 week ago. Peripheral edema secondary to pulmonary hypertension and right-sided heart failure Treatment of peripheral edema discussed.

## 2024-02-05 NOTE — Progress Notes (Signed)
 Lynn Kline 68 y.o.   Chief Complaint  Patient presents with   Medical Management of Chronic Issues    ED follow up on difficulty breathing, swelling in both legs and ankles     HISTORY OF PRESENT ILLNESS: This is a 68 y.o. female here for follow-up of hospital discharge. Was seen in the emergency department about 1 week ago Treated for COPD exacerbation Here for follow-up.  Leg edema much improved. Oxygen  dependent 24/7 Needs to follow-up with pulmonology No other complaints or medical concerns today.  Test / Admission - Considered:   COPD exacerbation Vitals signs within normal range and stable throughout visit. Laboratory/imaging studies significant for: See above 68 year old female presents emergency department complaints of shortness of breath.  Has felt short of breath for the past 3 days or so.  Is on chronic O2 secondary to chronic respiratory failure with hypoxia secondary to COPD.  Wears 3 L when at rest with increased to 5 L with exertion.  Reports increased cough over the past same amount of time.  Denies fever, chills, abdominal pain, nausea vomiting, urinary symptoms, change in bowel habits.  Does report central chest tightness.  Has been using inhalers at home which has helped temporarily with her symptoms. On exam, wheezing appreciated bilateral lung fields.  Patient with normal O2 sats on her 3 L nasal cannula.  Chest x-ray without evidence of pneumonia or other acute cardiopulmonary abnormality.  Patient with negative troponin, lack of acute ischemic change on EKG; low suspicion for ACS.  BNP negative; low suspicion for CHF.  Suspect patient's symptoms likely secondary to COPD exacerbation.  Treated with nebulized breathing treatment, Solu-Medrol  with improvement of symptoms.  Subsequently ambulated with pulse ox with no desaturation on her at home O2.  Will treat COPD exacerbation in the outpatient setting with continued corticosteroids, azithromycin  and recommend  follow-up with pulmonology.  Treatment plan discussed with patient and she acknowledged understanding was agreeable to said plan.  Patient overall well-appearing, afebrile in no acute distress. Worrisome signs and symptoms were discussed with the patient, and the patient acknowledged understanding to return to the ED if noticed. Patient was stable upon discharge.    Final Clinical Impression(s) / ED Diagnoses Final diagnoses:  COPD exacerbation (HCC)     HPI   Prior to Admission medications   Medication Sig Start Date End Date Taking? Authorizing Provider  albuterol  (PROVENTIL ) (2.5 MG/3ML) 0.083% nebulizer solution Take 2.5 mg by nebulization every 6 (six) hours as needed for wheezing or shortness of breath.   Yes [provider]  ALPRAZolam  (XANAX ) 0.25 MG tablet Take 1 tablet (0.25 mg total) by mouth 2 (two) times daily as needed. 11/29/23  Yes Arcadio Knuckles, MD  Budeson-Glycopyrrol-Formoterol (BREZTRI  AEROSPHERE) 160-9-4.8 MCG/ACT AERO Inhale 2 puffs into the lungs in the morning and at bedtime. 08/29/23  Yes Cobb, Mariah Shines, NP  cholecalciferol (VITAMIN D3) 25 MCG (1000 UNIT) tablet Take 1,000 Units by mouth daily.   Yes [provider]  doxycycline  (VIBRAMYCIN ) 100 MG capsule Take 1 capsule (100 mg total) by mouth 2 (two) times daily. 01/29/24  Yes Neil Balls A, PA  naproxen sodium (ALEVE) 220 MG tablet Take 220 mg by mouth 2 (two) times daily as needed.   Yes [provider]  OXYGEN  Inhale 3 L into the lungs at bedtime. 3L at bedtime and prn during day   Yes [provider]  VENTOLIN  HFA 108 (90 Base) MCG/ACT inhaler Inhale 2 puffs into the lungs every  6 (six) hours as needed for wheezing or shortness of breath. INHALE 1 TO 2 PUFFS INTO THE LUNGS EVERY 6 HOURS AS NEEDED FOR WHEEZING OR SHORTNESS OF BREATH 11/29/23  Yes Arcadio Knuckles, MD    Allergies  Allergen Reactions   Codeine Nausea And Vomiting   Erythromycin Nausea And Vomiting    Ampicillin Nausea And Vomiting, Rash and Other (See Comments)    Pt states that rash was on her tongue.  Has patient had a PCN reaction causing immediate rash, facial/tongue/throat swelling, SOB or lightheadedness with hypotension yes Has patient had a PCN reaction causing severe rash involving mucus membranes or skin necrosis: no Has patient had a PCN reaction that required hospitalization yes Has patient had a PCN reaction occurring within the last 10 years: no If all of the above answers are "NO", then may proceed with Cephalosporin use.     Patient Active Problem List   Diagnosis Date Noted   Chronic obstructive pulmonary disease with acute exacerbation (HCC) 11/29/2023   Encounter for general adult medical examination with abnormal findings 11/29/2023   Chronic respiratory failure with hypoxia (HCC) 05/04/2023   Prediabetes 04/13/2023   Senile purpura (HCC) 04/13/2023   GAD (generalized anxiety disorder) 04/13/2023   Compression fracture of body of thoracic vertebra (HCC) 04/06/2023   Polyp of colon 10/28/2022   Vitamin D  deficiency 12/22/2021   B12 deficiency 12/22/2021   Lung nodule    Coronary atherosclerosis due to calcified coronary lesion 08/13/2020   Atherosclerosis of aorta (HCC) 08/13/2020   Gallstones 11/06/2014   Hyperlipidemia with target LDL less than 130 08/19/2014   Dysphagia, pharyngoesophageal phase 08/12/2014   IBS (irritable bowel syndrome) 08/12/2014   Encounter for well adult exam with abnormal findings 12/14/2011   COPD with asthma (HCC) 11/15/2011   Tobacco abuse 11/15/2011    Past Medical History:  Diagnosis Date   Anxiety    Atherosclerosis    Colon polyp, hyperplastic    COPD (chronic obstructive pulmonary disease) (HCC)    Diverticulosis    Gallstone    Pneumonia     Past Surgical History:  Procedure Laterality Date   ABDOMINAL HYSTERECTOMY     BRONCHIAL BIOPSY  11/03/2020   Procedure: BRONCHIAL BIOPSIES;  Surgeon: Prudy Brownie, DO;   Location: [facility];  Service: Pulmonary;;   BRONCHIAL BRUSHINGS  11/03/2020   Procedure: BRONCHIAL BRUSHINGS;  Surgeon: Prudy Brownie, DO;  Location: [facility];  Service: Pulmonary;;   BRONCHIAL NEEDLE ASPIRATION BIOPSY  11/03/2020   Procedure: BRONCHIAL NEEDLE ASPIRATION BIOPSIES;  Surgeon: Prudy Brownie, DO;  Location: [facility];  Service: Pulmonary;;   BRONCHIAL WASHINGS  11/03/2020   Procedure: BRONCHIAL WASHINGS;  Surgeon: Prudy Brownie, DO;  Location: [facility];  Service: Pulmonary;;   CESAREAN SECTION     CHOLECYSTECTOMY     COLONOSCOPY     FIDUCIAL MARKER PLACEMENT  11/03/2020   Procedure: FIDUCIAL MARKER PLACEMENT;  Surgeon: Prudy Brownie, DO;  Location: [facility];  Service: Pulmonary;;   VIDEO BRONCHOSCOPY WITH ENDOBRONCHIAL NAVIGATION N/A 11/03/2020   Procedure: VIDEO BRONCHOSCOPY WITH ENDOBRONCHIAL NAVIGATION AND POSSIBLE ULTRASOUND;  Surgeon: Prudy Brownie, DO;  Location: [facility];  Service: Pulmonary;  Laterality: N/A;   VIDEO BRONCHOSCOPY WITH ENDOBRONCHIAL ULTRASOUND  11/03/2020   Procedure: VIDEO BRONCHOSCOPY WITH ENDOBRONCHIAL ULTRASOUND;  Surgeon: Prudy Brownie, DO;  Location: [facility];  Service: Pulmonary;;    Social History   Socioeconomic History   Marital status: Divorced    Spouse name:  Not on file   Number of children: Not on file   Years of education: Not on file   Highest education level: Not on file  Occupational History   Not on file  Tobacco Use   Smoking status: Former    Current packs/day: 0.00    Types: Cigarettes    Quit date: 12/15/2021    Years since quitting: 2.1   Smokeless tobacco: Never   Tobacco comments:    Pt states she quit smoking in March 2023. 07/21/2022 [place]  Substance and Sexual Activity   Alcohol use: No    Alcohol/week: 0.0 standard drinks of alcohol   Drug use: No   Sexual activity: Never    Comment: 1ST INTERCOURSE- 16, PARTNERS- 5  Other Topics Concern   Not on file  Social History  Narrative   Not on file   Social Drivers of Health   Financial Resource Strain: Low Risk  (02/16/2023)   Overall Financial Resource Strain (CARDIA)    Difficulty of Paying Living Expenses: Not very hard  Food Insecurity: No Food Insecurity (04/11/2023)   Hunger Vital Sign    Worried About Running Out of Food in the Last Year: Never true    Ran Out of Food in the Last Year: Never true  Transportation Needs: No Transportation Needs (04/11/2023)   PRAPARE - Administrator, Civil Service (Medical): No    Lack of Transportation (Non-Medical): No  Physical Activity: Sufficiently Active (02/16/2023)   Exercise Vital Sign    Days of Exercise per Week: 7 days    Minutes of Exercise per Session: 150+ min  Stress: No Stress Concern Present (02/16/2023)   [employer] of Occupational Health - Occupational Stress Questionnaire    Feeling of Stress : Only a little  Social Connections: Unknown (02/16/2023)   Social Connection and Isolation Panel [NHANES]    Frequency of Communication with Friends and Family: More than three times a week    Frequency of Social Gatherings with Friends and Family: More than three times a week    Attends Religious Services: Patient unable to answer    Active Member of Clubs or Organizations: No    Attends Banker Meetings: Never    Marital Status: Divorced  Catering manager Violence: Not At Risk (04/04/2023)   Humiliation, Afraid, Rape, and Kick questionnaire    Fear of Current or Ex-Partner: No    Emotionally Abused: No    Physically Abused: No    Sexually Abused: No    Family History  Problem Relation Age of Onset   Arthritis Mother    COPD Mother    Emphysema Mother    Stroke Father    Heart disease Father    Cancer Father        MELANOMA   Breast cancer Maternal Grandmother    Colon cancer Neg Hx      Review of Systems  Constitutional: Negative.  Negative for chills and fever.  HENT: Negative.  Negative for congestion and  sore throat.   Respiratory: Negative.  Negative for cough and shortness of breath.   Cardiovascular: Negative.  Negative for chest pain and palpitations.  Gastrointestinal:  Negative for abdominal pain, diarrhea, nausea and vomiting.  Genitourinary: Negative.  Negative for dysuria and hematuria.  Skin: Negative.  Negative for rash.  Neurological: Negative.  Negative for dizziness and headaches.  All other systems reviewed and are negative.   Vitals:   02/05/24 1424  BP: 126/74  Pulse: 95  Temp: 98 F (36.7 C)  SpO2: 97%    Physical Exam Vitals reviewed.  Constitutional:      Appearance: Normal appearance.  HENT:     Head: Normocephalic.     Mouth/Throat:     Mouth: Mucous membranes are moist.     Pharynx: Oropharynx is clear.  Eyes:     Extraocular Movements: Extraocular movements intact.     Pupils: Pupils are equal, round, and reactive to light.  Cardiovascular:     Rate and Rhythm: Normal rate and regular rhythm.     Pulses: Normal pulses.     Heart sounds: Normal heart sounds.  Pulmonary:     Effort: Pulmonary effort is normal.     Comments: Very diminished bilateral breath sounds compatible with emphysema Barrel chest Abdominal:     Palpations: Abdomen is soft.     Tenderness: There is no abdominal tenderness.  Musculoskeletal:     Right lower leg: Edema present.     Left lower leg: Edema present.     Comments: +1-+2 bilateral pedal edema  Skin:    Capillary Refill: Capillary refill takes less than 2 seconds.  Neurological:     General: No focal deficit present.     Mental Status: She is alert and oriented to person, place, and time.  Psychiatric:        Mood and Affect: Mood normal.        Behavior: Behavior normal.      ASSESSMENT & PLAN: A total of 43 minutes was spent with the patient and counseling/coordination of care regarding preparing for this visit, review of most recent office visit notes, review of most recent hospital discharge summary,  review of multiple chronic medical conditions and their management, review of all medications, review of most recent bloodwork results, review of health maintenance items, education on nutrition, prognosis, documentation, and need for follow up.   Problem List Items Addressed This Visit       Respiratory   COPD with asthma (HCC) - Primary   Status post recent exacerbation Oxygen  dependent 24/7 Continue Breztri  2 puffs twice a day with albuterol  as rescue inhalers Needs to follow-up with pulmonary doctor as well as PCP. Much better today than 1 week ago. Peripheral edema secondary to pulmonary hypertension and right-sided heart failure Treatment of peripheral edema discussed.      Other Visit Diagnoses       Hospital discharge follow-up          Patient Instructions  Chronic Obstructive Pulmonary Disease  Chronic obstructive pulmonary disease (COPD) is a long-term (chronic) lung problem. When you have COPD, it can feel harder to breathe in or out. The condition may get worse over time. There are things you can do to keep yourself as healthy as possible. What are the causes? Smoking. This is the most common cause. Breathing in fumes, smoke, or chemicals for a long time. Genes that are inherited, which means they are passed down from parent to child. What are the signs or symptoms? Shortness of breath. This may happen all the time. This may get worse when you move your body. This may get worse over time. You may have times when this becomes much worse all of a sudden. These are called flare-ups or exacerbations. A long-term cough, with or without thick mucus. Wheezing. Chest tightness. Feeling tired. Not being able to do activities like you used to do. How is this diagnosed? This condition is diagnosed based on: Your medical history. A  physical exam. Lung (pulmonary) function tests. You may have a test that measures the air flow out of the lungs when you breathe  out. You may also have tests, including: Chest X-ray. CT scan. Blood tests. How is this treated? This condition may be treated by: Quitting smoking, if you smoke. Using oxygen . Taking medicines. These may include: Inhalers. These have medicines in them that you breathe in. Daily inhalers. These help to prevent symptoms from happening. They are usually taken every day to prevent COPD flare-ups. Quick relief inhalers. These act fast to relieve symptoms. They are used only when needed and provide short-term relief. Other medicines that you breathe in or swallow. These may be used to open the airways, thin mucus, or treat infections. Breathing exercises to help you control or catch your breath. A mucus clearing device, if you have a lot of thick mucus. Pulmonary rehab. A place where you will learn about your condition and the best ways for you to manage it. Surgery. Follow these instructions at home: Medicines Take your medicines as told by your health care provider. Talk to your provider before taking any cough or allergy medicines. You may need to avoid medicines that cause your lungs to be dry. Lifestyle Several times a day, wash your hands with soap and water for at least 20 seconds. If you cannot use soap and water, use hand sanitizer. This may help keep you from getting an infection. Avoid being around crowds or people who are sick. Do not smoke or use any products that contain nicotine or tobacco. If you need help quitting, ask your provider. Stay active. Learn how to pace your activity during the day. Learn how to breathe to control your stress and catch your breath. Drink enough fluid to keep your pee (urine) pale yellow, unless you have been told not to. Eat healthy foods. Eat smaller meals more often. Get enough sleep. Most adults need 7 or more hours per night. General instructions Make a COPD action plan with your provider. This helps you to know what to do if you feel  worse than usual. Make sure you get all the shots, also called vaccines, that your provider recommends. Ask your provider about a flu shot and a pneumonia shot. If you need home oxygen  therapy, ask your provider how often to check your oxygen  level with a device called an oximeter. Keep all follow-up visits to review your COPD action plan. Your provider will want to check on your condition often to keep you healthy and out of the hospital. Contact a health care provider if: You are coughing up more mucus than usual. There is a change in the color or thickness of the mucus. It is harder to breathe than usual or you are short of breath while you are resting. You need to use your quick relief inhaler more often. You have trouble doing your normal activities such as getting dressed or walking in the house. Your skin color or fingernails turn blue. You have a fever or chills. Get help right away if: You are short of breath and cannot: Talk in full sentences. Do normal activities. You have chest pain. You feel confused. These symptoms may be an emergency. Call 911 right away. Do not wait to see if the symptoms will go away. Do not drive yourself to the hospital. This information is not intended to replace advice given to you by your health care provider. Make sure you discuss any questions you have with your health  care provider. Document Revised: 07/06/2023 Document Reviewed: 12/19/2022 Elsevier Patient Education  2024 Elsevier Inc.    Maryagnes Small, MD [facility]

## 2024-02-16 DIAGNOSIS — J441 Chronic obstructive pulmonary disease with (acute) exacerbation: Secondary | ICD-10-CM | POA: Diagnosis not present

## 2024-02-20 ENCOUNTER — Ambulatory Visit (INDEPENDENT_AMBULATORY_CARE_PROVIDER_SITE_OTHER): Payer: Medicare Other

## 2024-02-20 VITALS — Ht 61.5 in | Wt 130.0 lb

## 2024-02-20 DIAGNOSIS — Z0000 Encounter for general adult medical examination without abnormal findings: Secondary | ICD-10-CM

## 2024-02-20 NOTE — Patient Instructions (Signed)
 Ms. Da , Thank you for taking time to come for your Medicare Wellness Visit. I appreciate your ongoing commitment to your health goals. Please review the following plan we discussed and let me know if I can assist you in the future.   Referrals/Orders/Follow-Ups/Clinician Recommendations: Aim for 30 minutes of exercise or brisk walking, 6-8 glasses of water, and 5 servings of fruits and vegetables each day. Educated and advised to complete the Cologuard tests (has kit) and get the Tdap (Tetenus), COVID, and Shingles vaccines at [facility].  This is a list of the screening recommended for you and due dates:  Health Maintenance  Topic Date Due   Cologuard (Stool DNA test)  Never done   Zoster (Shingles) Vaccine (1 of 2) Never done   COVID-19 Vaccine (3 - 2024-25 season) 06/18/2023   DTaP/Tdap/Td vaccine (3 - Td or Tdap) 09/23/2023   Mammogram  11/28/2024*   Flu Shot  05/17/2024   Medicare Annual Wellness Visit  02/19/2025   Pneumonia Vaccine  Completed   DEXA scan (bone density measurement)  Completed   Hepatitis C Screening  Completed   HPV Vaccine  Aged Out   Meningitis B Vaccine  Aged Out   Colon Cancer Screening  Discontinued  *Topic was postponed. The date shown is not the original due date.    Advanced directives: (Provided) Advance directive discussed with you today. I have provided a copy for you to complete at home and have notarized. Once this is complete, please bring a copy in to our office so we can scan it into your chart.   Next Medicare Annual Wellness Visit scheduled for next year: Yes

## 2024-02-20 NOTE — Progress Notes (Signed)
 Subjective:   Lynn Kline is a 68 y.o. who presents for a Medicare Wellness preventive visit.  Visit Complete: Virtual I connected with  Lynn Kline on 02/20/24 by a audio enabled telemedicine application and verified that I am speaking with the correct person using two identifiers.  Patient Location: Home  Provider Location: Office/Clinic  I discussed the limitations of evaluation and management by telemedicine. The patient expressed understanding and agreed to proceed.  Vital Signs: Because this visit was a virtual/telehealth visit, some criteria may be missing or patient reported. Any vitals not documented were not able to be obtained and vitals that have been documented are patient reported.  VideoDeclined- This patient declined Librarian, academic. Therefore the visit was completed with audio only.  Persons Participating in Visit: Patient.  AWV Questionnaire: Yes: Patient Medicare AWV questionnaire was completed by the patient on 02/17/2024; I have confirmed that all information answered by patient is correct and no changes since this date.  Cardiac Risk Factors include: advanced age (>84men, >52 women);dyslipidemia     Objective:    Today's Vitals   02/20/24 1539  Weight: 130 lb (59 kg)  Height: 5' 1.5" (1.562 m)   Body mass index is 24.17 kg/m.     02/20/2024    3:38 PM 01/29/2024    8:23 PM 04/04/2023    3:55 PM 02/16/2023    4:22 PM 01/31/2022    9:10 AM 11/03/2020    7:26 AM 11/22/2016    7:48 AM  Advanced Directives  Does Patient Have a Medical Advance Directive? No No No No No No No  Would patient like information on creating a medical advance directive? Yes (MAU/Ambulatory/Procedural Areas - Information given) No - Patient declined No - Patient declined No - Patient declined Yes (MAU/Ambulatory/Procedural Areas - Information given) No - Patient declined     Current Medications (verified) Outpatient Encounter Medications as of  02/20/2024  Medication Sig   albuterol  (PROVENTIL ) (2.5 MG/3ML) 0.083% nebulizer solution Take 2.5 mg by nebulization every 6 (six) hours as needed for wheezing or shortness of breath.   ALPRAZolam  (XANAX ) 0.25 MG tablet Take 1 tablet (0.25 mg total) by mouth 2 (two) times daily as needed.   Budeson-Glycopyrrol-Formoterol (BREZTRI  AEROSPHERE) 160-9-4.8 MCG/ACT AERO Inhale 2 puffs into the lungs in the morning and at bedtime.   cholecalciferol (VITAMIN D3) 25 MCG (1000 UNIT) tablet Take 1,000 Units by mouth daily.   naproxen sodium (ALEVE) 220 MG tablet Take 220 mg by mouth 2 (two) times daily as needed.   OXYGEN  Inhale 3 L into the lungs at bedtime. 3L at bedtime and prn during day   VENTOLIN  HFA 108 (90 Base) MCG/ACT inhaler Inhale 2 puffs into the lungs every 6 (six) hours as needed for wheezing or shortness of breath. INHALE 1 TO 2 PUFFS INTO THE LUNGS EVERY 6 HOURS AS NEEDED FOR WHEEZING OR SHORTNESS OF BREATH   [DISCONTINUED] doxycycline  (VIBRAMYCIN ) 100 MG capsule Take 1 capsule (100 mg total) by mouth 2 (two) times daily.   No facility-administered encounter medications on file as of 02/20/2024.    Allergies (verified) Codeine, Erythromycin, and Ampicillin   History: Past Medical History:  Diagnosis Date   Anxiety    Atherosclerosis    Colon polyp, hyperplastic    COPD (chronic obstructive pulmonary disease) (HCC)    Diverticulosis    Gallstone    Pneumonia    Past Surgical History:  Procedure Laterality Date   ABDOMINAL HYSTERECTOMY  BRONCHIAL BIOPSY  11/03/2020   Procedure: BRONCHIAL BIOPSIES;  Surgeon: Prudy Brownie, DO;  Location: [facility];  Service: Pulmonary;;   BRONCHIAL BRUSHINGS  11/03/2020   Procedure: BRONCHIAL BRUSHINGS;  Surgeon: Prudy Brownie, DO;  Location: [facility];  Service: Pulmonary;;   BRONCHIAL NEEDLE ASPIRATION BIOPSY  11/03/2020   Procedure: BRONCHIAL NEEDLE ASPIRATION BIOPSIES;  Surgeon: Prudy Brownie, DO;  Location: [facility];   Service: Pulmonary;;   BRONCHIAL WASHINGS  11/03/2020   Procedure: BRONCHIAL WASHINGS;  Surgeon: Prudy Brownie, DO;  Location: [facility];  Service: Pulmonary;;   CESAREAN SECTION     CHOLECYSTECTOMY     COLONOSCOPY     FIDUCIAL MARKER PLACEMENT  11/03/2020   Procedure: FIDUCIAL MARKER PLACEMENT;  Surgeon: Prudy Brownie, DO;  Location: [facility];  Service: Pulmonary;;   VIDEO BRONCHOSCOPY WITH ENDOBRONCHIAL NAVIGATION N/A 11/03/2020   Procedure: VIDEO BRONCHOSCOPY WITH ENDOBRONCHIAL NAVIGATION AND POSSIBLE ULTRASOUND;  Surgeon: Prudy Brownie, DO;  Location: [facility];  Service: Pulmonary;  Laterality: N/A;   VIDEO BRONCHOSCOPY WITH ENDOBRONCHIAL ULTRASOUND  11/03/2020   Procedure: VIDEO BRONCHOSCOPY WITH ENDOBRONCHIAL ULTRASOUND;  Surgeon: Prudy Brownie, DO;  Location: [facility];  Service: Pulmonary;;   Family History  Problem Relation Age of Onset   Arthritis Mother    COPD Mother    Emphysema Mother    Stroke Father    Heart disease Father    Cancer Father        MELANOMA   Breast cancer Maternal Grandmother    Colon cancer Neg Hx    Social History   Socioeconomic History   Marital status: Divorced    Spouse name: Not on file   Number of children: Not on file   Years of education: Not on file   Highest education level: 12th grade  Occupational History   Not on file  Tobacco Use   Smoking status: Former    Current packs/day: 0.00    Types: Cigarettes    Quit date: 12/15/2021    Years since quitting: 2.1    Passive exposure: Past   Smokeless tobacco: Never   Tobacco comments:    Pt states she quit smoking in March 2023. 07/21/2022 Tay  Substance and Sexual Activity   Alcohol use: No    Alcohol/week: 0.0 standard drinks of alcohol   Drug use: No   Sexual activity: Never    Comment: 1ST INTERCOURSE- 16, PARTNERS- 5  Other Topics Concern   Not on file  Social History Narrative   Divorced   Social Drivers of Health   Financial Resource Strain: Low  Risk  (02/20/2024)   Overall Financial Resource Strain (CARDIA)    Difficulty of Paying Living Expenses: Not very hard  Food Insecurity: No Food Insecurity (02/20/2024)   Hunger Vital Sign    Worried About Running Out of Food in the Last Year: Never true    Ran Out of Food in the Last Year: Never true  Transportation Needs: No Transportation Needs (02/20/2024)   PRAPARE - Administrator, Civil Service (Medical): No    Lack of Transportation (Non-Medical): No  Physical Activity: Insufficiently Active (02/20/2024)   Exercise Vital Sign    Days of Exercise per Week: 1 day    Minutes of Exercise per Session: 10 min  Stress: No Stress Concern Present (02/20/2024)   [employer] of Occupational Health - Occupational Stress Questionnaire    Feeling of Stress : Only a little  Social Connections: Socially  Isolated (02/20/2024)   Social Connection and Isolation Panel [NHANES]    Frequency of Communication with Friends and Family: More than three times a week    Frequency of Social Gatherings with Friends and Family: Once a week    Attends Religious Services: Never    Database administrator or Organizations: No    Attends Banker Meetings: Never    Marital Status: Divorced    Tobacco Counseling Counseling given: No Tobacco comments: Pt states she quit smoking in March 2023. 07/21/2022 Tay    Clinical Intake:  Pre-visit preparation completed: Yes  Pain : No/denies pain     BMI - recorded: 24.17 Nutritional Status: BMI of 19-24  Normal Nutritional Risks: None Diabetes: No  Lab Results  Component Value Date   HGBA1C 5.8 11/29/2023   HGBA1C 6.0 04/14/2023   HGBA1C 6.1 10/25/2022     How often do you need to have someone help you when you read instructions, pamphlets, or other written materials from your doctor or pharmacy?: 1 - Never  Interpreter Needed?: No  Information entered by :: Kandy Orris, CMA   Activities of Daily Living     02/20/2024    3:42  PM 02/17/2024    7:36 AM  In your present state of health, do you have any difficulty performing the following activities:  Hearing? 0 0  Vision? 0 0  Difficulty concentrating or making decisions? 0 0  Walking or climbing stairs? 0 0  Dressing or bathing? 0 0  Doing errands, shopping? 0 0  Preparing Food and eating ? N N  Using the Toilet? N N  In the past six months, have you accidently leaked urine? N N  Do you have problems with loss of bowel control? N N  Managing your Medications? N N  Managing your Finances? N N  Housekeeping or managing your Housekeeping? N N    Patient Care Team: Arcadio Knuckles, MD as PCP - General (Internal Medicine) Szabat, Tino Foreman, [facility] (Inactive) as Pharmacist (Pharmacist)  Indicate any recent Medical Services you may have received from other than [facility] providers in the past year (date may be approximate).     Assessment:   This is a routine wellness examination for Lynn Kline.  Hearing/Vision screen Hearing Screening - Comments:: Denies hearing difficulties   Vision Screening - Comments:: Denies vision concerns - pt plans to find an Ophthalmologist herself and schedule   Goals Addressed               This Visit's Progress     Patient Stated (pt-stated)        Patient stated she wants to keep her sanity.       Depression Screen     02/20/2024    3:45 PM 02/16/2023    4:20 PM 12/05/2022    3:32 PM 07/08/2022    3:51 PM 01/31/2022    9:12 AM 12/22/2021    1:52 PM 10/01/2020    8:51 AM  PHQ 2/9 Scores  PHQ - 2 Score 0 0 0  0 1 0  PHQ- 9 Score 1 0       Exception Documentation    Patient refusal       Fall Risk     02/20/2024    3:43 PM 02/17/2024    7:36 AM 02/16/2023    4:23 PM 02/12/2023    2:08 PM 12/05/2022    3:32 PM  Fall Risk   Falls in the past year?  0 0 0 0 0  Number falls in past yr: 0 0 0 0 0  Injury with Fall? 0 0 0 0 0  Risk for fall due to : No Fall Risks  No Fall Risks  No Fall Risks  Follow up Falls prevention discussed;Falls  evaluation completed  Falls prevention discussed  Falls evaluation completed    MEDICARE RISK AT HOME:  Medicare Risk at Home Any stairs in or around the home?: Yes If so, are there any without handrails?: No Home free of loose throw rugs in walkways, pet beds, electrical cords, etc?: Yes Adequate lighting in your home to reduce risk of falls?: Yes Life alert?: No Use of a cane, walker or w/c?: No Grab bars in the bathroom?: No Shower chair or bench in shower?: No Elevated toilet seat or a handicapped toilet?: No  TIMED UP AND GO:  Was the test performed?  No  Cognitive Function: 6CIT completed        02/20/2024    3:46 PM 02/16/2023    4:25 PM 01/31/2022    9:13 AM  6CIT Screen  What Year? 0 points 0 points 0 points  What month? 0 points 0 points 0 points  What time? 0 points 0 points 0 points  Count back from 20 0 points 0 points 0 points  Months in reverse 0 points 0 points 0 points  Repeat phrase 0 points 0 points 0 points  Total Score 0 points 0 points 0 points    Immunizations Immunization History  Administered Date(s) Administered   DTaP 06/14/2005   Fluad Quad(high Dose 65+) 07/04/2020, 07/14/2021   Fluad Trivalent(High Dose 65+) 08/08/2023   Influenza Whole 10/23/2011   Influenza,inj,Quad PF,6+ Mos 08/19/2014, 10/24/2018, 08/01/2019   PFIZER(Purple Top)SARS-COV-2 Vaccination 07/04/2020, 07/25/2020   Pneumococcal Conjugate-13 09/14/2005   Pneumococcal Polysaccharide-23 08/19/2014, 12/22/2021   Tdap 09/22/2013    Screening Tests Health Maintenance  Topic Date Due   Fecal DNA (Cologuard)  Never done   Zoster Vaccines- Shingrix  (1 of 2) Never done   COVID-19 Vaccine (3 - 2024-25 season) 06/18/2023   DTaP/Tdap/Td (3 - Td or Tdap) 09/23/2023   MAMMOGRAM  11/28/2024 (Originally 01/18/2014)   INFLUENZA VACCINE  05/17/2024   Medicare Annual Wellness (AWV)  02/19/2025   Pneumonia Vaccine 68+ Years old  Completed   DEXA SCAN  Completed   Hepatitis C Screening   Completed   HPV VACCINES  Aged Out   Meningococcal B Vaccine  Aged Out   Colonoscopy  Discontinued    Health Maintenance  Health Maintenance Due  Topic Date Due   Fecal DNA (Cologuard)  Never done   Zoster Vaccines- Shingrix  (1 of 2) Never done   COVID-19 Vaccine (3 - 2024-25 season) 06/18/2023   DTaP/Tdap/Td (3 - Td or Tdap) 09/23/2023   Health Maintenance Items Addressed:  Cologuard status: Pt has the kit and advised to complete  Additional Screening:  Vision Screening: Recommended annual ophthalmology exams for early detection of glaucoma and other disorders of the eye. Pt stated she plans to locate an Ophthalmologist on her own and will let PCP know the name once is seen.  Dental Screening: Recommended annual dental exams for proper oral hygiene  Community Resource Referral / Chronic Care Management: CRR required this visit?  No   CCM required this visit?  No     Plan:     I have personally reviewed and noted the following in the patient's chart:   Medical and social history Use  of alcohol, tobacco or illicit drugs  Current medications and supplements including opioid prescriptions. Patient is not currently taking opioid prescriptions. Functional ability and status Nutritional status Physical activity Advanced directives List of other physicians Hospitalizations, surgeries, and ER visits in previous 12 months Vitals Screenings to include cognitive, depression, and falls Referrals and appointments  In addition, I have reviewed and discussed with patient certain preventive protocols, quality metrics, and best practice recommendations. A written personalized care plan for preventive services as well as general preventive health recommendations were provided to patient.     Patria Bookbinder, CMA   02/20/2024   After Visit Summary: (MyChart) Due to this being a telephonic visit, the after visit summary with patients personalized plan was offered to patient via  MyChart   Notes: Nothing significant to report at this time.

## 2024-03-05 DIAGNOSIS — J441 Chronic obstructive pulmonary disease with (acute) exacerbation: Secondary | ICD-10-CM | POA: Diagnosis not present

## 2024-03-18 DIAGNOSIS — J441 Chronic obstructive pulmonary disease with (acute) exacerbation: Secondary | ICD-10-CM | POA: Diagnosis not present

## 2024-03-22 ENCOUNTER — Other Ambulatory Visit: Payer: Self-pay | Admitting: Internal Medicine

## 2024-03-22 DIAGNOSIS — J441 Chronic obstructive pulmonary disease with (acute) exacerbation: Secondary | ICD-10-CM

## 2024-04-05 DIAGNOSIS — J441 Chronic obstructive pulmonary disease with (acute) exacerbation: Secondary | ICD-10-CM | POA: Diagnosis not present

## 2024-04-12 ENCOUNTER — Telehealth: Payer: Self-pay

## 2024-04-12 ENCOUNTER — Other Ambulatory Visit: Payer: Self-pay | Admitting: Internal Medicine

## 2024-04-12 DIAGNOSIS — J4489 Other specified chronic obstructive pulmonary disease: Secondary | ICD-10-CM

## 2024-04-12 DIAGNOSIS — F411 Generalized anxiety disorder: Secondary | ICD-10-CM

## 2024-04-12 MED ORDER — BREZTRI AEROSPHERE 160-9-4.8 MCG/ACT IN AERO: 2.0000 | INHALATION_SPRAY | Freq: Two times a day (BID) | RESPIRATORY_TRACT | 3 refills | Status: AC

## 2024-04-12 NOTE — Telephone Encounter (Signed)
 Copied from CRM [id number]. Topic: Clinical - Prescription Issue >> Apr 12, 2024 10:24 AM Corean SAUNDERS wrote: Reason for CRM: Patient checking on the status of her prescription request that [employer] sent to the clinic. Patient advised that request is not yet in our system andis wondering someone call her to confirm this just has not been scanned in of it it never came through to  the fax machine?   I called and spoke to pt. Pt states she needs her Rx for Breztri  sent to [facility]. I informed pt that I would have Katie sign this and we could fax this when Izetta is back in office. Pt verbalized understanding. NFN

## 2024-04-16 ENCOUNTER — Encounter: Payer: Self-pay | Admitting: Internal Medicine

## 2024-04-16 ENCOUNTER — Ambulatory Visit (INDEPENDENT_AMBULATORY_CARE_PROVIDER_SITE_OTHER)

## 2024-04-16 ENCOUNTER — Ambulatory Visit (INDEPENDENT_AMBULATORY_CARE_PROVIDER_SITE_OTHER): Admitting: Internal Medicine

## 2024-04-16 ENCOUNTER — Ambulatory Visit: Payer: Self-pay | Admitting: Internal Medicine

## 2024-04-16 VITALS — BP 130/86 | HR 99 | Temp 98.0°F | Ht 61.5 in | Wt 130.2 lb

## 2024-04-16 DIAGNOSIS — E538 Deficiency of other specified B group vitamins: Secondary | ICD-10-CM

## 2024-04-16 DIAGNOSIS — R0789 Other chest pain: Secondary | ICD-10-CM | POA: Diagnosis not present

## 2024-04-16 DIAGNOSIS — F411 Generalized anxiety disorder: Secondary | ICD-10-CM | POA: Diagnosis not present

## 2024-04-16 DIAGNOSIS — J9611 Chronic respiratory failure with hypoxia: Secondary | ICD-10-CM

## 2024-04-16 DIAGNOSIS — J441 Chronic obstructive pulmonary disease with (acute) exacerbation: Secondary | ICD-10-CM

## 2024-04-16 DIAGNOSIS — J439 Emphysema, unspecified: Secondary | ICD-10-CM | POA: Diagnosis not present

## 2024-04-16 DIAGNOSIS — R0602 Shortness of breath: Secondary | ICD-10-CM | POA: Diagnosis not present

## 2024-04-16 DIAGNOSIS — R059 Cough, unspecified: Secondary | ICD-10-CM | POA: Diagnosis not present

## 2024-04-16 DIAGNOSIS — R079 Chest pain, unspecified: Secondary | ICD-10-CM | POA: Diagnosis not present

## 2024-04-16 LAB — CBC WITH DIFFERENTIAL/PLATELET
Basophils Absolute: 0.1 10*3/uL (ref 0.0–0.1)
Basophils Relative: 0.8 % (ref 0.0–3.0)
Eosinophils Absolute: 0.1 10*3/uL (ref 0.0–0.7)
Eosinophils Relative: 2 % (ref 0.0–5.0)
HCT: 40.7 % (ref 36.0–46.0)
Hemoglobin: 13.3 g/dL (ref 12.0–15.0)
Lymphocytes Relative: 26.6 % (ref 12.0–46.0)
Lymphs Abs: 1.8 10*3/uL (ref 0.7–4.0)
MCHC: 32.7 g/dL (ref 30.0–36.0)
MCV: 91.9 fl (ref 78.0–100.0)
Monocytes Absolute: 0.4 10*3/uL (ref 0.1–1.0)
Monocytes Relative: 5.8 % (ref 3.0–12.0)
Neutro Abs: 4.4 10*3/uL (ref 1.4–7.7)
Neutrophils Relative %: 64.8 % (ref 43.0–77.0)
Platelets: 331 10*3/uL (ref 150.0–400.0)
RBC: 4.43 Mil/uL (ref 3.87–5.11)
RDW: 13.8 % (ref 11.5–15.5)
WBC: 6.8 10*3/uL (ref 4.0–10.5)

## 2024-04-16 LAB — BASIC METABOLIC PANEL WITH GFR
BUN: 20 mg/dL (ref 6–23)
CO2: 37 meq/L — ABNORMAL HIGH (ref 19–32)
Calcium: 9.4 mg/dL (ref 8.4–10.5)
Chloride: 97 meq/L (ref 96–112)
Creatinine, Ser: 0.57 mg/dL (ref 0.40–1.20)
GFR: 93.04 mL/min (ref >60.00)
Glucose, Bld: 95 mg/dL (ref 70–99)
Potassium: 4 meq/L (ref 3.5–5.1)
Sodium: 139 meq/L (ref 135–145)

## 2024-04-16 LAB — TSH: TSH: 1.49 u[IU]/mL (ref 0.35–5.50)

## 2024-04-16 LAB — TROPONIN I (HIGH SENSITIVITY): High Sens Troponin I: 3 ng/L (ref 2–17)

## 2024-04-16 LAB — D-DIMER, QUANTITATIVE: D-Dimer, Quant: 0.65 ug{FEU}/mL — ABNORMAL HIGH (ref <0.50)

## 2024-04-16 MED ORDER — PREDNISONE 50 MG PO TABS: 50.0000 mg | ORAL_TABLET | Freq: Every day | ORAL | 0 refills | Status: DC

## 2024-04-16 NOTE — Progress Notes (Unsigned)
 Subjective:  Patient ID: Lynn Kline, female    DOB: [birth year]  Age: 68 y.o. MRN: [id number]  CC: Medical Management of Chronic Issues (4 month follow up. ) and Breathing Problem (Patient states that she cannot breathe and it's been a struggle for her this time around even with the oxygen . )   HPI Lynn Kline presents for f/up ----  Discussed the use of AI scribe software for clinical note transcription with the patient, who gave verbal consent to proceed.  History of Present Illness   Lynn Kline is a 68 year old female who presents with respiratory symptoms and chest tightness.  She experiences significant phlegm production, which is watery and white, with no blood present. She finds it difficult to clear the phlegm, describing it as feeling like 'a stroke trying to get it out.'  She experiences chest tightness and uses breathing treatments to alleviate this symptom. Humidity exacerbates her condition, making it 'terrible' for her to go outside.  She is on continuous oxygen  therapy, which she now wears all day to manage her symptoms and to move from room to room without difficulty.  She reports a decreased appetite, eating only small amounts at a time, and notes that she does not have much of an appetite. No significant weight changes. No stomach issues.       Outpatient Medications Prior to Visit  Medication Sig Dispense Refill   albuterol  (PROVENTIL ) (2.5 MG/3ML) 0.083% nebulizer solution Take 2.5 mg by nebulization every 6 (six) hours as needed for wheezing or shortness of breath.     ALPRAZolam  (XANAX ) 0.25 MG tablet TAKE 1 TABLET(0.25 MG) BY MOUTH TWICE DAILY AS NEEDED 60 tablet 2   budesonide -glycopyrrolate-formoterol (BREZTRI  AEROSPHERE) 160-9-4.8 MCG/ACT AERO inhaler Inhale 2 puffs into the lungs in the morning and at bedtime. 3 each 3   cholecalciferol (VITAMIN D3) 25 MCG (1000 UNIT) tablet Take 1,000 Units by mouth daily.     naproxen sodium (ALEVE) 220 MG  tablet Take 220 mg by mouth 2 (two) times daily as needed.     OXYGEN  Inhale 3 L into the lungs at bedtime. 3L at bedtime and prn during day     VENTOLIN  HFA 108 (90 Base) MCG/ACT inhaler INHALE 1 TO 2 PUFFS INTO THE LUNGS EVERY 6 HOURS AS NEEDED FOR WHEEZING OR SHORTNESS OF BREATH 18 g 3   No facility-administered medications prior to visit.    ROS Review of Systems  Objective:  BP 130/86 (BP Location: Left Arm, Patient Position: Sitting, Cuff Size: Small)   Pulse 99   Temp 98 F (36.7 C) (Oral)   Ht 5' 1.5 (1.562 m)   Wt 130 lb 3.2 oz (59.1 kg)   SpO2 99%   BMI 24.20 kg/m   BP Readings from Last 3 Encounters:  04/16/24 130/86  02/05/24 126/74  01/29/24 (!) 125/91    Wt Readings from Last 3 Encounters:  04/16/24 130 lb 3.2 oz (59.1 kg)  02/20/24 130 lb (59 kg)  02/05/24 130 lb 4 oz (59.1 kg)    Physical Exam Vitals reviewed.  Constitutional:      Appearance: Normal appearance.  HENT:     Mouth/Throat:     Mouth: Mucous membranes are moist.   Eyes:     General: No scleral icterus.    Conjunctiva/sclera: Conjunctivae normal.    Cardiovascular:     Rate and Rhythm: Normal rate and regular rhythm.     Heart sounds: Normal heart sounds, S1 normal  and S2 normal. No murmur heard.    No friction rub. No gallop.     Comments: EKG-- SR with short PR, 93 bpm No LVH, Q waves, or ST/T wave changes  Unchanged  Pulmonary:     Effort: Tachypnea present.     Breath sounds: No stridor. Examination of the right-upper field reveals decreased breath sounds and wheezing. Examination of the left-upper field reveals decreased breath sounds and wheezing. Examination of the right-middle field reveals decreased breath sounds and wheezing. Examination of the left-middle field reveals decreased breath sounds and wheezing. Examination of the right-lower field reveals decreased breath sounds and wheezing. Examination of the left-lower field reveals decreased breath sounds and wheezing.  Decreased breath sounds and wheezing present. No rhonchi or rales.  Abdominal:     General: Abdomen is flat.     Palpations: There is no mass.     Tenderness: There is no abdominal tenderness. There is no guarding.     Hernia: No hernia is present.   Musculoskeletal:        General: Normal range of motion.     Cervical back: Neck supple.     Right lower leg: No edema.     Left lower leg: No edema.  Lymphadenopathy:     Cervical: No cervical adenopathy.   Skin:    General: Skin is warm and dry.   Neurological:     Mental Status: She is alert.     Lab Results  Component Value Date   WBC 6.8 04/16/2024   HGB 13.3 04/16/2024   HCT 40.7 04/16/2024   PLT 331.0 04/16/2024   GLUCOSE 95 04/16/2024   CHOL 206 (H) 11/29/2023   TRIG 88.0 11/29/2023   HDL 89.60 11/29/2023   LDLDIRECT 141.0 11/15/2011   LDLCALC 99 11/29/2023   ALT 24 11/21/2023   AST 32 11/21/2023   NA 139 04/16/2024   K 4.0 04/16/2024   CL 97 04/16/2024   CREATININE 0.57 04/16/2024   BUN 20 04/16/2024   CO2 37 (H) 04/16/2024   TSH 1.49 04/16/2024   INR 0.8 04/14/2023   HGBA1C 5.8 11/29/2023    DG Chest 2 View Result Date: 01/29/2024 CLINICAL DATA:  SOB chronic cough EXAM: CHEST - 2 VIEW COMPARISON:  11/21/2023 and 07/12/2021. FINDINGS: The heart size and mediastinal contours are within normal limits. Lungs are hyperinflated suggesting COPD. Linear opacities left apex with pleural thickening consistent with scarring which appears unchanged. There is no focal consolidation. No pneumothorax or pleural effusion. Midthoracic compression deformity is stable. IMPRESSION: Findings suggest COPD. Left apical scarring. No acute cardiopulmonary process. Electronically Signed   By: Fonda Field M.D.   On: 01/29/2024 21:46   DG Chest 2 View Result Date: 04/16/2024 CLINICAL DATA:  Cough, congestion, short of breath, chest pain EXAM: CHEST - 2 VIEW COMPARISON:  01/29/2024 FINDINGS: Frontal and lateral views of the chest  demonstrate an unremarkable cardiac silhouette. Lungs remain hyperexpanded. Stable background emphysema and left apical scarring. Postsurgical changes within the left upper lobe, with surgical clips unchanged. No acute airspace disease, effusion, or pneumothorax. Stable midthoracic compression deformity. No acute bony abnormalities. IMPRESSION: 1. Stable emphysema.  No acute process. Electronically Signed   By: Ozell Daring M.D.   On: 04/16/2024 10:36     Assessment & Plan:  Chest tightness -     EKG 12-Lead -     Troponin I (High Sensitivity); Future -     D-dimer, quantitative; Future -     DG Chest  2 View; Future  B12 deficiency -     CBC with Differential/Platelet; Future  GAD (generalized anxiety disorder) -     TSH; Future  Chronic respiratory failure with hypoxia (HCC) -     Basic metabolic panel with GFR; Future -     CBC with Differential/Platelet; Future  Chronic obstructive pulmonary disease with acute exacerbation (HCC) -     predniSONE ; Take 1 tablet (50 mg total) by mouth daily with breakfast.  Dispense: 5 tablet; Refill: 0     Follow-up: Return in about 3 months (around 07/17/2024).  Debby Molt, MD

## 2024-04-16 NOTE — Patient Instructions (Signed)
Nonspecific Chest Pain, Adult Chest pain is an uncomfortable, tight, or painful feeling in the chest. The pain can feel like a crushing, aching, or squeezing pressure. A person can feel a burning or tingling sensation. Chest pain can also be felt in your back, neck, jaw, shoulder, or arm. This pain can be worse when you move, sneeze, or take a deep breath. Chest pain can be caused by a condition that is life-threatening. This must be treated right away. It can also be caused by something that is not life-threatening. If you have chest pain, it can be hard to know the difference, so it is important to get help right away to make sure that you do not have a serious condition. Some life-threatening causes of chest pain include: Heart attack. A tear in the body's main blood vessel (aortic dissection). Inflammation around your heart (pericarditis). A problem in the lungs, such as a blood clot (pulmonary embolism) or a collapsed lung (pneumothorax). Some non life-threatening causes of chest pain include: Heartburn. Anxiety or stress. Damage to the bones, muscles, and cartilage that make up your chest wall. Pneumonia or bronchitis. Shingles infection (varicella-zoster virus). Your chest pain may come and go. It may also be constant. Your health care provider will do tests and other studies to find the cause of your pain. Treatment will depend on the cause of your chest pain. Follow these instructions at home: Medicines Take over-the-counter and prescription medicines only as told by your health care provider. If you were prescribed an antibiotic medicine, take it as told by your health care provider. Do not stop taking the antibiotic even if you start to feel better. Activity Avoid any activities that cause chest pain. Do not lift anything that is heavier than 10 lb (4.5 kg), or the limit that you are told, until your health care provider says that it is safe. Rest as directed by your health care  provider. Return to your normal activities only as told by your health care provider. Ask your health care provider what activities are safe for you. Lifestyle     Do not use any products that contain nicotine or tobacco, such as cigarettes, e-cigarettes, and chewing tobacco. If you need help quitting, ask your health care provider. Do not drink alcohol. Make healthy lifestyle changes as recommended. These may include: Getting regular exercise. Ask your health care provider to suggest some exercises that are safe for you. Eating a heart-healthy diet. This includes plenty of fresh fruits and vegetables, whole grains, low-fat (lean) protein, and low-fat dairy products. A dietitian can help you find healthy eating options. Maintaining a healthy weight. Managing any other health conditions you may have, such as high blood pressure (hypertension) or diabetes. Reducing stress, such as with yoga or relaxation techniques. General instructions Pay attention to any changes in your symptoms. It is up to you to get the results of any tests that were done. Ask your health care provider, or the department that is doing the tests, when your results will be ready. Keep all follow-up visits as told by your health care provider. This is important. You may be asked to go for further testing if your chest pain does not go away. Contact a health care provider if: Your chest pain does not go away. You feel depressed. You have a fever. You notice changes in your symptoms or develop new symptoms. Get help right away if: Your chest pain gets worse. You have a cough that gets worse, or you  cough up blood. You have severe pain in your abdomen. You faint. You have sudden, unexplained chest discomfort. You have sudden, unexplained discomfort in your arms, back, neck, or jaw. You have shortness of breath at any time. You suddenly start to sweat, or your skin gets clammy. You feel nausea or you vomit. You  suddenly feel lightheaded or dizzy. You have severe weakness, or unexplained weakness or fatigue. Your heart begins to beat quickly, or it feels like it is skipping beats. These symptoms may represent a serious problem that is an emergency. Do not wait to see if the symptoms will go away. Get medical help right away. Call your local emergency services (911 in the [place] Do not drive yourself to the hospital. Summary Chest pain can be caused by a condition that is serious and requires urgent treatment. It may also be caused by something that is not life-threatening. Your health care provider may do lab tests and other studies to find the cause of your pain. Follow your health care provider's instructions on taking medicines, making lifestyle changes, and getting emergency treatment if symptoms become worse. Keep all follow-up visits as told by your health care provider. This includes visits for any further testing if your chest pain does not go away. This information is not intended to replace advice given to you by your health care provider. Make sure you discuss any questions you have with your health care provider. Document Revised: 08/18/2022 Document Reviewed: 08/18/2022 Elsevier Patient Education  2024 [employer].

## 2024-04-17 DIAGNOSIS — J441 Chronic obstructive pulmonary disease with (acute) exacerbation: Secondary | ICD-10-CM | POA: Diagnosis not present

## 2024-04-18 ENCOUNTER — Telehealth: Payer: Self-pay

## 2024-04-18 NOTE — Telephone Encounter (Signed)
 Spoke with patient regarding prior message . Advised patient a new script has been sent over to [facility] . Patient's voice was understanding .Nothign else further needed.

## 2024-04-18 NOTE — Telephone Encounter (Signed)
 Copied from CRM [id number]. Topic: Clinical - Prescription Issue >> Apr 18, 2024 11:01 AM Rozanna MATSU wrote: Reason for CRM: PT CALLED AGAIN IN REGARDS TO HER Rx for Breztri  sent to [facility]. I DID ADVISED PT THEY ARE WAITING ON PROVIDER TO SIGN. STATED SHE IS NOT OF MEDS YET BUT NEEDS PRESCRIPTION FILLED TO BE MAILED.  ATC x1 LVM for patient to call our office back regarding prior message .

## 2024-05-05 DIAGNOSIS — J441 Chronic obstructive pulmonary disease with (acute) exacerbation: Secondary | ICD-10-CM | POA: Diagnosis not present

## 2024-05-18 DIAGNOSIS — J441 Chronic obstructive pulmonary disease with (acute) exacerbation: Secondary | ICD-10-CM | POA: Diagnosis not present

## 2024-05-25 ENCOUNTER — Other Ambulatory Visit: Payer: Self-pay | Admitting: Internal Medicine

## 2024-05-31 ENCOUNTER — Ambulatory Visit: Admitting: Nurse Practitioner

## 2024-06-18 DIAGNOSIS — J441 Chronic obstructive pulmonary disease with (acute) exacerbation: Secondary | ICD-10-CM | POA: Diagnosis not present

## 2024-07-03 ENCOUNTER — Telehealth: Admitting: Nurse Practitioner

## 2024-07-03 DIAGNOSIS — J4489 Other specified chronic obstructive pulmonary disease: Secondary | ICD-10-CM | POA: Diagnosis not present

## 2024-07-03 DIAGNOSIS — J9611 Chronic respiratory failure with hypoxia: Secondary | ICD-10-CM

## 2024-07-03 DIAGNOSIS — R490 Dysphonia: Secondary | ICD-10-CM | POA: Diagnosis not present

## 2024-07-03 DIAGNOSIS — Z87891 Personal history of nicotine dependence: Secondary | ICD-10-CM

## 2024-07-03 DIAGNOSIS — R0989 Other specified symptoms and signs involving the circulatory and respiratory systems: Secondary | ICD-10-CM

## 2024-07-03 DIAGNOSIS — Z720 Tobacco use: Secondary | ICD-10-CM

## 2024-07-03 NOTE — Progress Notes (Unsigned)
 Patient ID: Lynn Kline, female     DOB: [birth year], 68 y.o.      MRN: [id number]  No chief complaint on file.   Virtual Visit via Video Note  I connected with Lynn Kline on 07/04/24 at  4:00 PM EDT by a video enabled telemedicine application and verified that I am speaking with the correct person using two identifiers.  Location: Patient: Home Provider: Office   I discussed the limitations of evaluation and management by telemedicine and the availability of in person appointments. The patient expressed understanding and agreed to proceed.  History of Present Illness: 68 year old female, former smoker followed for severe COPD and lung nodule. She had an abnormal lung cancer screening CT in 2021 followed by bronchoscopy with non-diagnostic nodules and negative for malignancy. She is a former patient of Dr. Harriet and last seen in office 08/08/2023 by [facility] NP. Past medical history significant for CAD, IBS, anxiety, HLD.   TEST/EVENTS:  10/24/2018 PFT: FVC 74, FEV1 51, ratio 50, TLC 130, DLCOunc 73 08/06/2022 CT chest: there is a trace pericardial effusion. Emphysematous changes with apical pleural scarring. Bronchial wall thickening is present bilaterally. Stable scarring and metallic markers of the LUL. There is a 3 mm nodule in RLL, unchanged.  04/04/2023 CXR: hyperinflation. Left apical pleural thickening and scarring. Blunting of the inferior costophrenic angles. Pleural thickening is possible vs effusion. Kyphosis with compression fx of midthoracic spine, progressive since previous    07/20/2022: OV with Dr. Brenna. Multiple recent exacerbations requiring steroids and abx. Using breztri  twice daily. Just completed another course of prednisone . She has cough, sputum production, chest tightness, wheezing. Started on azithromycin  M/W/F. Plan to repeat ECG in 4 weeks. Repeat CT chest for evaluation of nodules ordered   08/18/2022: OV with Lynn Feldpausch NP for follow up. She has been doing better  since she was here last; still with daily productive cough and chest congestion. She feels like the azithromycin  makes her feel bad and causes her heart to race. She's noticed that since she started it, it feels like her heart pounds every time she gets up to move. She doesn't want to continue on it right now. She denies any lightheadedness, dizziness or chest pain. Her breathing is stable. No increased wheezing. She is using Breztri  twice daily. D/c azithromycin . Started her on daliresp .    09/15/2022: OV with Lynn Weidmann NP for follow up after being started on daliresp . She had trouble tolerating the daliresp  due to loose stools and upset stomach, even with the 250 mcg dose. She has since stopped it. Breathing has been stable for the last few weeks. She did purchase a portable neb machine, which she feels like has helped because she can use it while she's at work. 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 She does take frequent breaks outside and her boss has started working on cleaning out the supply room, so she hopes this will help. No increased cough or chest congestion. She continues on Breztri  twice daily. Using her nebs 1-3 times a day.    11/03/2022: OV with Dr. Brenna. Did not tolerate the azithromycin . Unsure if this was related to medication or ongoing exacerbations of COPD. Trouble with breathing chemical smells and dust at work. 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 Only on nebulized treatments and her inhalers. Unable to tolerate daliresp  in the past due to diarrhea. Recommend trying  azithromycin  only M/W/F.    02/13/2023: OV with Dr. Brenna. Did not tolerate azithromycin  M/W/F. Discussed evaluation for asthma/COPD overlap since she does have elevated eos previously. Would consider ordering repeat lab work, RAST, and IgE; however, she does not want to use any injection meds. Does not make sense to do further workup if  she is not willing to try anything else. Recommend daily antihistamine.    05/04/2023: Lynn Kline with Lynn Exley NP for hospital follow up. She was admitted 04/04/2023-04/10/2023 for acute respiratory failure secondary to AECOPD. She was treated with steroids, doxycyline, and bronchodilators. She was also found to have compression fracture of T7 and T8 which ortho deemed chronic. She was advised to have DEXA scan as an outpatient. She was discharged on prednisone  taper, supplemental O2 2 lpm, and vitamin D . She tells me today that she is feeling better. Still has a cough and chest congestion. Not producing much phlegm but when she gets something up, it's white in color. Notices some wheezing but improved compared to when she was in the hospital. Breathing is feeling closer to her baseline but she is having to rest more. She's not wearing her oxygen . She's been checking her levels at home and says they have been 93-94% at rest and will drop lower with activity but recovers quickly with rest again. She does have some swelling in her ankles/feet that she's noticed for the past few months. She denies orthopnea, PND, fevers, chills, hemoptysis, calf pain, CP. She is using her nebs every 6 hours, which is what they were doing in the hospital.    06/07/2023: OV with Lynn Mercer NP for follow-up visit.  She feels better since our last appointment.  She completed prednisone  taper.  She has been wearing her oxygen  at night, which does seem to make her feel better.  She is only using it during the day when she goes out of the house.  Does not feel like she needs it when she is at home.  She is relatively sedentary with her current job.  She does try to get up and walk around the house to get some exercise, which she does also feel helps her lungs.  Still having some cough but it is mostly after her neb treatments.  Chest congestion has improved.  Not producing much phlegm.  Still clear to white in color whenever she does get it up.  No wheezing.   Denies any fevers, chills, mops this, lower extremity swelling, orthopnea.  Swelling in her legs resolved.  Using nebs twice a day followed by Breztri .  Still taking Mucinex .  She did have significantly elevated eosinophil count on previous blood work.  BNP was normal.   08/08/2023: OV with Lynn Shaw NP Patient presents today for follow up. She was doing okay since she was here last up until the last two weeks or so. She's been having more trouble with her cough and feels more congested. She is having difficulties getting the mucus up, especially in the mornings. When she does, it is clear to white, which is normal for her. She does feel a little more short winded. Noticing more wheezing. She denies fevers, chills, hemoptysis, leg swelling, orthopnea. She is using her Breztri  twice a day and nebs twice a day. She uses her flutter valve in the mornings. Only taking mucinex  once a day. She always feels better with steroids she says. No increased oxygen  requirement. She had lung cancer screening CT yesterday.   07/03/2024: Today - follow up Patient presents  today for follow up via virtual visit. She has been doing okay over the last several months. She had an exacerbation in February and April requiring ED visits. Since then, has not had any exacerbations requiring steroids or abx. Her PCP advised her to use her oxygen  more consistently with exertion, which does help. She's still using her Breztri . She uses her albuterol  1-2 times a day. She does her nebs typically twice daily. Notices that when she's more sedentary, her breathing tolerance declines so she tries to get up and move often as she works from home. She does have a chronic daily cough that is productive with white sputum. She denies fevers, chills, hemoptysis, leg swelling, anorexia, weight loss. Due for lung cancer screening CT next month. Taking mucinex .   Allergies  Allergen Reactions   Codeine Nausea And Vomiting   Erythromycin Nausea And Vomiting    Ampicillin Nausea And Vomiting, Rash and Other (See Comments)    Pt states that rash was on her tongue.  Has patient had a PCN reaction causing immediate rash, facial/tongue/throat swelling, SOB or lightheadedness with hypotension yes Has patient had a PCN reaction causing severe rash involving mucus membranes or skin necrosis: no Has patient had a PCN reaction that required hospitalization yes Has patient had a PCN reaction occurring within the last 10 years: no If all of the above answers are NO, then may proceed with Cephalosporin use.    Immunization History  Administered Date(s) Administered   DTaP 06/14/2005   Fluad Quad(high Dose 65+) 07/04/2020, 07/14/2021   Fluad Trivalent(High Dose 65+) 08/08/2023   Influenza Whole 10/23/2011   Influenza,inj,Quad PF,6+ Mos 08/19/2014, 10/24/2018, 08/01/2019   PFIZER(Purple Top)SARS-COV-2 Vaccination 07/04/2020, 07/25/2020   Pneumococcal Conjugate-13 09/14/2005   Pneumococcal Polysaccharide-23 08/19/2014, 12/22/2021   Tdap 09/22/2013   Past Medical History:  Diagnosis Date   Anxiety    Atherosclerosis    Colon polyp, hyperplastic    COPD (chronic obstructive pulmonary disease) (HCC)    Diverticulosis    Gallstone    Pneumonia     Tobacco History: Social History   Tobacco Use  Smoking Status Former   Current packs/day: 0.00   Types: Cigarettes   Quit date: 12/15/2021   Years since quitting: 2.5   Passive exposure: Past  Smokeless Tobacco Never  Tobacco Comments   Pt states she quit smoking in March 2023. 07/21/2022 Tay   Counseling given: Not Answered Tobacco comments: Pt states she quit smoking in March 2023. 07/21/2022 Tay   Outpatient Medications Prior to Visit  Medication Sig Dispense Refill   albuterol  (PROVENTIL ) (2.5 MG/3ML) 0.083% nebulizer solution USE 3 ML VIA NEBULIZER EVERY 4 HOURS AS NEEDED FOR WHEEZING OR SHORTNESS OF BREATH 75 mL 0   ALPRAZolam  (XANAX ) 0.25 MG tablet TAKE 1 TABLET(0.25 MG) BY MOUTH TWICE  DAILY AS NEEDED 60 tablet 2   budesonide -glycopyrrolate-formoterol (BREZTRI  AEROSPHERE) 160-9-4.8 MCG/ACT AERO inhaler Inhale 2 puffs into the lungs in the morning and at bedtime. 3 each 3   cholecalciferol (VITAMIN D3) 25 MCG (1000 UNIT) tablet Take 1,000 Units by mouth daily.     naproxen sodium (ALEVE) 220 MG tablet Take 220 mg by mouth 2 (two) times daily as needed.     OXYGEN  Inhale 3 L into the lungs at bedtime. 3L at bedtime and prn during day     predniSONE  (DELTASONE ) 50 MG tablet Take 1 tablet (50 mg total) by mouth daily with breakfast. 5 tablet 0   VENTOLIN  HFA 108 (90 Base) MCG/ACT inhaler  INHALE 1 TO 2 PUFFS INTO THE LUNGS EVERY 6 HOURS AS NEEDED FOR WHEEZING OR SHORTNESS OF BREATH 18 g 3   No facility-administered medications prior to visit.     Review of Systems: As above  Observations/Objective: Unable to visualize due to technical difficulties No labored breathing.  Speech is clear and coherent with logical content.  Patient is alert and oriented at baseline.   Assessment and Plan: COPD with asthma Severe COPD with asthma overlap. High symptom burden and prone to exacerbations. Last exacerbation 01/2024. She has a history of significantly elevated eosinophils - recommended biologic therapy but she has declined this. Revisited today; she has continued to defer. Discussed inhaled PDE4 therapy with Ohtuvayre  - she will consider this and notify if she would like to move forward. Understands risks of recurrent steroid use. Continue triple therapy regimen and mucociliary clearance. Action plan in place. Graded exercises encouraged.  Patient Instructions  Continue Breztri  2 puffs Twice daily. Brush tongue and rinse mouth afterwards Continue Albuterol  inhaler 2 puffs or 3 mL neb every 6 hours as needed for shortness of breath or wheezing. Notify if symptoms persist despite rescue inhaler/neb use. Use nebs at least twice a day before your Breztri . Follow with flutter valve ten times   Continue supplemental oxygen  4 lpm with POC with activity and 3 lpm at night. Goal >88-90%. Ok to stay off oxygen  at rest as long as oxygen  >88-90% Continue Mucinex  [phone] mg Twice daily for chest congestion   Referral to ENT for voice hoarseness/throat clearing   Consider Ohtuvayre  nebulizer treatments. These would be an additional medication that you would use twice daily, regardless of symptoms    Follow up in 4 months with any new pulmonologist or Lynn Issachar Broady,NP. If symptoms do not improve or worsen, please contact office for sooner follow up or seek emergency care.   Chronic respiratory failure with hypoxia (HCC) Stable without increased O2 requirement. Encouraged to utilize consistently with activity. Reviewed risks of hypoxia. Goal >88-90%  Tobacco abuse Former smoker. Followed by [facility] program. LDCT chest scan due October 2025.   Voice hoarseness Persistent voice hoarseness and chronic throat clearing. Referral placed to ENT for further evaluation.      I discussed the assessment and treatment plan with the patient. The patient was provided an opportunity to ask questions and all were answered. The patient agreed with the plan and demonstrated an understanding of the instructions.   The patient was advised to call back or seek an in-person evaluation if the symptoms worsen or if the condition fails to improve as anticipated.  I provided 31 minutes of non-face-to-face time during this encounter.   Comer LULLA Rouleau, NP

## 2024-07-03 NOTE — Patient Instructions (Addendum)
 Continue Breztri  2 puffs Twice daily. Brush tongue and rinse mouth afterwards Continue Albuterol  inhaler 2 puffs or 3 mL neb every 6 hours as needed for shortness of breath or wheezing. Notify if symptoms persist despite rescue inhaler/neb use. Use nebs at least twice a day before your Breztri . Follow with flutter valve ten times  Continue supplemental oxygen  4 lpm with POC with activity and 3 lpm at night. Goal >88-90%. Ok to stay off oxygen  at rest as long as oxygen  >88-90% Continue Mucinex  [phone] mg Twice daily for chest congestion   Referral to ENT for voice hoarseness/throat clearing   Consider Ohtuvayre  nebulizer treatments. These would be an additional medication that you would use twice daily, regardless of symptoms    Follow up in 4 months with any new pulmonologist or Katie Lyndell Allaire,NP. If symptoms do not improve or worsen, please contact office for sooner follow up or seek emergency care.

## 2024-07-04 ENCOUNTER — Other Ambulatory Visit: Payer: Self-pay | Admitting: Internal Medicine

## 2024-07-04 ENCOUNTER — Encounter: Payer: Self-pay | Admitting: Nurse Practitioner

## 2024-07-04 ENCOUNTER — Encounter (INDEPENDENT_AMBULATORY_CARE_PROVIDER_SITE_OTHER): Payer: Self-pay

## 2024-07-04 DIAGNOSIS — J441 Chronic obstructive pulmonary disease with (acute) exacerbation: Secondary | ICD-10-CM

## 2024-07-04 DIAGNOSIS — R490 Dysphonia: Secondary | ICD-10-CM | POA: Insufficient documentation

## 2024-07-04 NOTE — Assessment & Plan Note (Signed)
 Former smoker. Followed by [facility] program. LDCT chest scan due October 2025.

## 2024-07-04 NOTE — Assessment & Plan Note (Signed)
 Severe COPD with asthma overlap. High symptom burden and prone to exacerbations. Last exacerbation 01/2024. She has a history of significantly elevated eosinophils - recommended biologic therapy but she has declined this. Revisited today; she has continued to defer. Discussed inhaled PDE4 therapy with Ohtuvayre  - she will consider this and notify if she would like to move forward. Understands risks of recurrent steroid use. Continue triple therapy regimen and mucociliary clearance. Action plan in place. Graded exercises encouraged.  Patient Instructions  Continue Breztri  2 puffs Twice daily. Brush tongue and rinse mouth afterwards Continue Albuterol  inhaler 2 puffs or 3 mL neb every 6 hours as needed for shortness of breath or wheezing. Notify if symptoms persist despite rescue inhaler/neb use. Use nebs at least twice a day before your Breztri . Follow with flutter valve ten times  Continue supplemental oxygen  4 lpm with POC with activity and 3 lpm at night. Goal >88-90%. Ok to stay off oxygen  at rest as long as oxygen  >88-90% Continue Mucinex  [phone] mg Twice daily for chest congestion   Referral to ENT for voice hoarseness/throat clearing   Consider Ohtuvayre  nebulizer treatments. These would be an additional medication that you would use twice daily, regardless of symptoms    Follow up in 4 months with any new pulmonologist or Katie Carlyann Placide,NP. If symptoms do not improve or worsen, please contact office for sooner follow up or seek emergency care.

## 2024-07-04 NOTE — Assessment & Plan Note (Signed)
 Stable without increased O2 requirement. Encouraged to utilize consistently with activity. Reviewed risks of hypoxia. Goal >88-90%

## 2024-07-04 NOTE — Assessment & Plan Note (Signed)
 Persistent voice hoarseness and chronic throat clearing. Referral placed to ENT for further evaluation.

## 2024-07-06 ENCOUNTER — Other Ambulatory Visit: Payer: Self-pay | Admitting: Internal Medicine

## 2024-07-06 DIAGNOSIS — F411 Generalized anxiety disorder: Secondary | ICD-10-CM

## 2024-07-17 ENCOUNTER — Ambulatory Visit: Admitting: Internal Medicine

## 2024-07-17 ENCOUNTER — Encounter: Payer: Self-pay | Admitting: Internal Medicine

## 2024-07-17 VITALS — BP 138/82 | HR 93 | Temp 97.7°F | Resp 16 | Ht 61.5 in | Wt 127.6 lb

## 2024-07-17 DIAGNOSIS — Z23 Encounter for immunization: Secondary | ICD-10-CM

## 2024-07-17 DIAGNOSIS — E785 Hyperlipidemia, unspecified: Secondary | ICD-10-CM | POA: Diagnosis not present

## 2024-07-17 DIAGNOSIS — E538 Deficiency of other specified B group vitamins: Secondary | ICD-10-CM | POA: Diagnosis not present

## 2024-07-17 DIAGNOSIS — J4489 Other specified chronic obstructive pulmonary disease: Secondary | ICD-10-CM

## 2024-07-17 DIAGNOSIS — I2510 Atherosclerotic heart disease of native coronary artery without angina pectoris: Secondary | ICD-10-CM

## 2024-07-17 DIAGNOSIS — J9611 Chronic respiratory failure with hypoxia: Secondary | ICD-10-CM

## 2024-07-17 DIAGNOSIS — I2584 Coronary atherosclerosis due to calcified coronary lesion: Secondary | ICD-10-CM

## 2024-07-17 LAB — FOLATE: Folate: 7 ng/mL (ref >5.9)

## 2024-07-17 LAB — CBC WITH DIFFERENTIAL/PLATELET
Basophils Absolute: 0.1 K/uL (ref 0.0–0.1)
Basophils Relative: 0.9 % (ref 0.0–3.0)
Eosinophils Absolute: 0.1 K/uL (ref 0.0–0.7)
Eosinophils Relative: 2 % (ref 0.0–5.0)
HCT: 37.6 % (ref 36.0–46.0)
Hemoglobin: 12.4 g/dL (ref 12.0–15.0)
Lymphocytes Relative: 23.9 % (ref 12.0–46.0)
Lymphs Abs: 1.8 K/uL (ref 0.7–4.0)
MCHC: 32.9 g/dL (ref 30.0–36.0)
MCV: 90.6 fl (ref 78.0–100.0)
Monocytes Absolute: 0.5 K/uL (ref 0.1–1.0)
Monocytes Relative: 6.5 % (ref 3.0–12.0)
Neutro Abs: 4.9 K/uL (ref 1.4–7.7)
Neutrophils Relative %: 66.7 % (ref 43.0–77.0)
Platelets: 407 K/uL — ABNORMAL HIGH (ref 150.0–400.0)
RBC: 4.15 Mil/uL (ref 3.87–5.11)
RDW: 13.4 % (ref 11.5–15.5)
WBC: 7.4 K/uL (ref 4.0–10.5)

## 2024-07-17 LAB — VITAMIN B12: Vitamin B-12: 188 pg/mL — ABNORMAL LOW (ref 211–911)

## 2024-07-17 MED ORDER — ROSUVASTATIN CALCIUM 10 MG PO TABS: 10.0000 mg | ORAL_TABLET | Freq: Every day | ORAL | 1 refills | Status: DC

## 2024-07-17 NOTE — Progress Notes (Unsigned)
 Subjective:  Patient ID: Lynn Kline, female    DOB: 1956-05-03  Age: 68 y.o. MRN: [id number]  CC: Hypertension and COPD   HPI Lynn Kline presents for f/up -----  Discussed the use of AI scribe software for clinical note transcription with the patient, who gave verbal consent to proceed.  History of Present Illness Lynn Kline is a 68 year old female with lung disease who presents with shortness of breath and weight loss.  She experiences shortness of breath primarily during physical activity, with stable oxygen  levels at rest. No wheezing is present, but she has a morning cough that occasionally produces mucus, requiring significant effort without associated pain or hemoptysis. No fever or chills.  She has noticed recent, unexplained weight loss despite maintaining her usual eating habits. She occasionally experiences nausea, particularly in the mornings, but denies vomiting. She reports intermittent headaches and a tendency for her voice to weaken when speaking.  Her last chest x-ray was during her previous visit, and she has a scheduled lung screening later this month. She typically gets her flu shot annually.  She describes difficulty with mobility, particularly when getting in and out of the bathtub, which she attributes to not having a shower.  She reports regular bowel movements, with the last one occurring the previous evening, although she sometimes struggles with constipation. She has not taken any medication for constipation recently.     Outpatient Medications Prior to Visit  Medication Sig Dispense Refill   albuterol  (PROVENTIL ) (2.5 MG/3ML) 0.083% nebulizer solution USE 3 ML VIA NEBULIZER EVERY 4 HOURS AS NEEDED FOR WHEEZING OR SHORTNESS OF BREATH 75 mL 0   ALPRAZolam  (XANAX ) 0.25 MG tablet TAKE 1 TABLET(0.25 MG) BY MOUTH TWICE DAILY AS NEEDED 60 tablet 1   budesonide -glycopyrrolate-formoterol (BREZTRI  AEROSPHERE) 160-9-4.8 MCG/ACT AERO inhaler Inhale 2  puffs into the lungs in the morning and at bedtime. 3 each 3   cholecalciferol (VITAMIN D3) 25 MCG (1000 UNIT) tablet Take 1,000 Units by mouth daily.     naproxen sodium (ALEVE) 220 MG tablet Take 220 mg by mouth 2 (two) times daily as needed.     OXYGEN  Inhale 3 L into the lungs at bedtime. 3L at bedtime and prn during day     VENTOLIN  HFA 108 (90 Base) MCG/ACT inhaler INHALE 1 TO 2 PUFFS INTO THE LUNGS EVERY 6 HOURS AS NEEDED FOR WHEEZING OR SHORTNESS OF BREATH 18 g 3   predniSONE  (DELTASONE ) 50 MG tablet Take 1 tablet (50 mg total) by mouth daily with breakfast. 5 tablet 0   No facility-administered medications prior to visit.    ROS Review of Systems  Constitutional:  Positive for unexpected weight change (WT LOSS). Negative for appetite change, chills, diaphoresis and fatigue.  HENT: Negative.  Negative for sore throat and trouble swallowing.   Respiratory:  Positive for cough, shortness of breath and wheezing. Negative for chest tightness and stridor.   Cardiovascular:  Negative for chest pain, palpitations and leg swelling.  Gastrointestinal:  Positive for constipation. Negative for abdominal pain, blood in stool, diarrhea, nausea and vomiting.  Genitourinary: Negative.  Negative for difficulty urinating.  Musculoskeletal: Negative.  Negative for myalgias.  Skin: Negative.   Neurological:  Negative for dizziness and weakness.  Hematological:  Negative for adenopathy. Does not bruise/bleed easily.  Psychiatric/Behavioral: Negative.      Objective:  BP 138/82 (BP Location: Left Arm, Patient Position: Sitting, Cuff Size: Normal)   Pulse 93   Temp 97.7 F (36.5  C) (Oral)   Resp 16   Ht 5' 1.5 (1.562 m)   Wt 127 lb 9.6 oz (57.9 kg)   SpO2 96%   BMI 23.72 kg/m   BP Readings from Last 3 Encounters:  07/17/24 138/82  04/16/24 130/86  02/05/24 126/74    Wt Readings from Last 3 Encounters:  07/17/24 127 lb 9.6 oz (57.9 kg)  04/16/24 130 lb 3.2 oz (59.1 kg)  02/20/24 130  lb (59 kg)    Physical Exam Vitals reviewed.  Constitutional:      General: She is not in acute distress.    Appearance: Normal appearance. She is ill-appearing. She is not toxic-appearing or diaphoretic.  HENT:     Nose: Nose normal.     Mouth/Throat:     Mouth: Mucous membranes are moist.  Eyes:     General: No scleral icterus.    Conjunctiva/sclera: Conjunctivae normal.  Cardiovascular:     Rate and Rhythm: Normal rate and regular rhythm.     Heart sounds: No murmur heard.    No friction rub. No gallop.  Pulmonary:     Effort: Pulmonary effort is normal.     Breath sounds: No stridor. No wheezing, rhonchi or rales.  Abdominal:     General: Abdomen is flat.     Palpations: There is no mass.     Tenderness: There is no abdominal tenderness. There is no guarding.     Hernia: No hernia is present.  Musculoskeletal:        General: Normal range of motion.     Cervical back: Neck supple.     Right lower leg: No edema.     Left lower leg: No edema.  Lymphadenopathy:     Cervical: No cervical adenopathy.  Skin:    General: Skin is warm and dry.  Neurological:     General: No focal deficit present.     Mental Status: She is alert.  Psychiatric:        Mood and Affect: Mood normal.        Behavior: Behavior normal.     Lab Results  Component Value Date   WBC 6.8 04/16/2024   HGB 13.3 04/16/2024   HCT 40.7 04/16/2024   PLT 331.0 04/16/2024   GLUCOSE 95 04/16/2024   CHOL 206 (H) 11/29/2023   TRIG 88.0 11/29/2023   HDL 89.60 11/29/2023   LDLDIRECT 141.0 11/15/2011   LDLCALC 99 11/29/2023   ALT 24 11/21/2023   AST 32 11/21/2023   NA 139 04/16/2024   K 4.0 04/16/2024   CL 97 04/16/2024   CREATININE 0.57 04/16/2024   BUN 20 04/16/2024   CO2 37 (H) 04/16/2024   TSH 1.49 04/16/2024   INR 0.8 04/14/2023   HGBA1C 5.8 11/29/2023    DG Chest 2 View Result Date: 01/29/2024 CLINICAL DATA:  SOB chronic cough EXAM: CHEST - 2 VIEW COMPARISON:  11/21/2023 and 07/12/2021.  FINDINGS: The heart size and mediastinal contours are within normal limits. Lungs are hyperinflated suggesting COPD. Linear opacities left apex with pleural thickening consistent with scarring which appears unchanged. There is no focal consolidation. No pneumothorax or pleural effusion. Midthoracic compression deformity is stable. IMPRESSION: Findings suggest COPD. Left apical scarring. No acute cardiopulmonary process. Electronically Signed   By: Fonda Field M.D.   On: 01/29/2024 21:46    Assessment & Plan:  Need for immunization against influenza -     Flu vaccine HIGH DOSE PF(Fluzone Trivalent)  COPD with asthma (HCC)- Stable.  Chronic respiratory failure with hypoxia (HCC)- Will continue oxygen .  B12 deficiency- Will restart B12 replacement therapy. -     CBC with Differential/Platelet; Future -     Vitamin B12; Future -     Methylmalonic acid, serum; Future -     Folate; Future  Hyperlipidemia with target LDL less than 130 -     Rosuvastatin Calcium ; Take 1 tablet (10 mg total) by mouth daily.  Dispense: 90 tablet; Refill: 1  Coronary atherosclerosis due to calcified coronary lesion- Will start a statin for CV risk reduction. -     Rosuvastatin Calcium ; Take 1 tablet (10 mg total) by mouth daily.  Dispense: 90 tablet; Refill: 1     Follow-up: Return in about 3 months (around 10/17/2024).  Debby Molt, MD

## 2024-07-17 NOTE — Patient Instructions (Signed)
 Vitamin B12 Deficiency Vitamin B12 deficiency means that your body does not have enough vitamin B12. The body needs this important vitamin: To make red blood cells. To make genes (DNA). To help the nerves work. If you do not have enough vitamin B12 in your body, you can have health problems, such as not having enough red blood cells in the blood (anemia). What are the causes? Not eating enough foods that contain vitamin B12. Not being able to take in (absorb) vitamin B12 from the food that you eat. Certain diseases. A condition in which the body does not make enough of a certain protein. This results in your body not taking in enough vitamin B12. Having a surgery in which part of the stomach or small intestine is taken out. Taking medicines that make it hard for the body to take in vitamin B12. These include: Heartburn medicines. Some medicines that are used to treat diabetes. What increases the risk? Being an older adult. Eating a vegetarian or vegan diet that does not include any foods that come from animals. Not eating enough foods that contain vitamin B12 while you are pregnant. Taking certain medicines. Having alcoholism. What are the signs or symptoms? In some cases, there are no symptoms. If the condition leads to too few blood cells or nerve damage, symptoms can occur, such as: Feeling weak or tired. Not being hungry. Losing feeling (numbness) or tingling in your hands and feet. Redness and burning of the tongue. Feeling sad (depressed). Confusion or memory problems. Trouble walking. If anemia is very bad, symptoms can include: Being short of breath. Being dizzy. Having a very fast heartbeat. How is this treated? Changing the way you eat and drink, such as: Eating more foods that contain vitamin B12. Drinking little or no alcohol. Getting vitamin B12 shots. Taking vitamin B12 supplements by mouth (orally). Your doctor will tell you the dose that is best for you. Follow  these instructions at home: Eating and drinking  Eat foods that come from animals and have a lot of vitamin B12 in them. These include: Meats and poultry. This includes beef, pork, chicken, [place], and organ meats, such as liver. Seafood, such as clams, rainbow trout, salmon, tuna, and haddock. Eggs. Dairy foods such as milk, yogurt, and cheese. Eat breakfast cereals that have vitamin B12 added to them (are fortified). Check the label. The items listed above may not be a complete list of foods and beverages you can eat and drink. Contact a dietitian for more information. Alcohol use Do not drink alcohol if: Your doctor tells you not to drink. You are pregnant, may be pregnant, or are planning to become pregnant. If you drink alcohol: Limit how much you have to: 0-1 drink a day for women. 0-2 drinks a day for men. Know how much alcohol is in your drink. In the [place] one drink equals one 12 oz bottle of beer (355 mL), one 5 oz glass of wine (148 mL), or one 1 oz glass of hard liquor (44 mL). General instructions Get any vitamin B12 shots if told by your doctor. Take supplements only as told by your doctor. Follow the directions. Keep all follow-up visits. Contact a doctor if: Your symptoms come back. Your symptoms get worse or do not get better with treatment. Get help right away if: You have trouble breathing. You have a very fast heartbeat. You have chest pain. You get dizzy. You faint. These symptoms may be an emergency. Get help right away. Call 911.  Do not wait to see if the symptoms will go away. Do not drive yourself to the hospital. Summary Vitamin B12 deficiency means that your body is not getting enough of the vitamin. In some cases, there are no symptoms of this condition. Treatment may include making a change in the way you eat and drink, getting shots, or taking supplements. Eat foods that have vitamin B12 in them. This information is not intended to replace advice  given to you by your health care provider. Make sure you discuss any questions you have with your health care provider. Document Revised: 05/28/2021 Document Reviewed: 05/28/2021 Elsevier Patient Education  2024 [employer].

## 2024-07-18 ENCOUNTER — Ambulatory Visit: Payer: Self-pay | Admitting: Internal Medicine

## 2024-07-18 DIAGNOSIS — J441 Chronic obstructive pulmonary disease with (acute) exacerbation: Secondary | ICD-10-CM | POA: Diagnosis not present

## 2024-07-18 MED ORDER — CYANOCOBALAMIN 2000 MCG PO TABS: 2000.0000 ug | ORAL_TABLET | Freq: Every day | ORAL | 0 refills | Status: DC

## 2024-07-19 ENCOUNTER — Ambulatory Visit: Admitting: Nurse Practitioner

## 2024-07-20 LAB — METHYLMALONIC ACID, SERUM: Methylmalonic Acid, Quant: 323 nmol/L (ref 69–390)

## 2024-08-07 ENCOUNTER — Ambulatory Visit (HOSPITAL_BASED_OUTPATIENT_CLINIC_OR_DEPARTMENT_OTHER)
Admission: RE | Admit: 2024-08-07 | Discharge: 2024-08-07 | Disposition: A | Discharge status: Home / Self Care | Source: Ambulatory Visit | Attending: Acute Care | Admitting: Acute Care

## 2024-08-07 DIAGNOSIS — Z122 Encounter for screening for malignant neoplasm of respiratory organs: Secondary | ICD-10-CM | POA: Insufficient documentation

## 2024-08-07 DIAGNOSIS — F17200 Nicotine dependence, unspecified, uncomplicated: Secondary | ICD-10-CM | POA: Diagnosis not present

## 2024-08-07 DIAGNOSIS — Z87891 Personal history of nicotine dependence: Secondary | ICD-10-CM | POA: Diagnosis not present

## 2024-08-08 ENCOUNTER — Encounter (INDEPENDENT_AMBULATORY_CARE_PROVIDER_SITE_OTHER): Payer: Self-pay

## 2024-08-12 ENCOUNTER — Telehealth: Payer: Self-pay | Admitting: Acute Care

## 2024-08-12 DIAGNOSIS — R911 Solitary pulmonary nodule: Secondary | ICD-10-CM

## 2024-08-12 NOTE — Addendum Note (Signed)
 Addended by: DIONISIO AQUAS on: 08/12/2024 04:01 PM   Modules accepted: Orders

## 2024-08-12 NOTE — Telephone Encounter (Signed)
 Call report LDCT:    IMPRESSION: 1. Lung-RADS 4A, suspicious. Follow up low-dose chest CT without contrast in 3 months (please use the following order, CT CHEST LCS NODULE FOLLOW-UP W/O CM) is recommended. New 6.8 mm solid left upper lobe pulmonary nodule. 2. Three-vessel coronary atherosclerosis. 3. Chronic severe T7 and T8 vertebral compression fractures. 4. Aortic Atherosclerosis (ICD10-I70.0) and Emphysema (ICD10-J43.9).   These results will be called to the ordering clinician or representative by the Radiologist Assistant, and communication documented in the PACS or [employer].     Electronically Signed   By: Selinda DELENA Blue M.D.   On: 08/11/2024 09:50

## 2024-08-12 NOTE — Telephone Encounter (Signed)
 Spoke with patient by phone to review the results of recent LDCT.  New nodule noted in left lung with recommendation for 3 months follow up LDCT.  Patient states over the past year she has felt a decline in her breathing, feeling more short of breath with activity.  She denies any recent symptoms of illness, stating she has a chronic type cough but phlegm is clear to white.  She is not generally bothered by seasonal allergies.  Patient is agreeable to 3 months repeat LDCT.  Will fax results/plan to PCP.  Patient had no questions.  She does take medication for lipids and is aware of emphysema and changes to spine.  Order placed for LDCT.

## 2024-08-12 NOTE — Telephone Encounter (Signed)
 Results reviewed by Lauraine Lites, NP:  3 month follow up on this is fine. . Due after 11/07/2024. Please let PCP know plan, and order follow up. Thanks so much.

## 2024-09-02 ENCOUNTER — Encounter (HOSPITAL_COMMUNITY): Payer: Self-pay

## 2024-09-02 ENCOUNTER — Emergency Department (HOSPITAL_COMMUNITY)

## 2024-09-02 ENCOUNTER — Other Ambulatory Visit: Payer: Self-pay

## 2024-09-02 ENCOUNTER — Inpatient Hospital Stay (HOSPITAL_COMMUNITY)
Admission: EM | Admit: 2024-09-02 | Discharge: 2024-09-16 | DRG: 699 | Disposition: A | Discharge status: Home / Self Care | Source: Ambulatory Visit | Attending: Internal Medicine | Admitting: Internal Medicine

## 2024-09-02 ENCOUNTER — Telehealth: Admitting: Physician Assistant

## 2024-09-02 ENCOUNTER — Ambulatory Visit: Payer: Self-pay

## 2024-09-02 DIAGNOSIS — Z825 Family history of asthma and other chronic lower respiratory diseases: Secondary | ICD-10-CM

## 2024-09-02 DIAGNOSIS — J4489 Other specified chronic obstructive pulmonary disease: Secondary | ICD-10-CM | POA: Diagnosis present

## 2024-09-02 DIAGNOSIS — E162 Hypoglycemia, unspecified: Secondary | ICD-10-CM | POA: Diagnosis not present

## 2024-09-02 DIAGNOSIS — Z7951 Long term (current) use of inhaled steroids: Secondary | ICD-10-CM

## 2024-09-02 DIAGNOSIS — R52 Pain, unspecified: Secondary | ICD-10-CM | POA: Diagnosis not present

## 2024-09-02 DIAGNOSIS — Z01818 Encounter for other preprocedural examination: Secondary | ICD-10-CM

## 2024-09-02 DIAGNOSIS — K5732 Diverticulitis of large intestine without perforation or abscess without bleeding: Secondary | ICD-10-CM | POA: Diagnosis not present

## 2024-09-02 DIAGNOSIS — K5720 Diverticulitis of large intestine with perforation and abscess without bleeding: Secondary | ICD-10-CM | POA: Diagnosis present

## 2024-09-02 DIAGNOSIS — M19032 Primary osteoarthritis, left wrist: Secondary | ICD-10-CM | POA: Diagnosis not present

## 2024-09-02 DIAGNOSIS — M25532 Pain in left wrist: Secondary | ICD-10-CM | POA: Diagnosis not present

## 2024-09-02 DIAGNOSIS — Z6823 Body mass index (BMI) 23.0-23.9, adult: Secondary | ICD-10-CM

## 2024-09-02 DIAGNOSIS — E538 Deficiency of other specified B group vitamins: Secondary | ICD-10-CM | POA: Diagnosis present

## 2024-09-02 DIAGNOSIS — F411 Generalized anxiety disorder: Secondary | ICD-10-CM | POA: Diagnosis present

## 2024-09-02 DIAGNOSIS — Z860100 Personal history of colon polyps, unspecified: Secondary | ICD-10-CM

## 2024-09-02 DIAGNOSIS — I2510 Atherosclerotic heart disease of native coronary artery without angina pectoris: Secondary | ICD-10-CM | POA: Diagnosis present

## 2024-09-02 DIAGNOSIS — N321 Vesicointestinal fistula: Secondary | ICD-10-CM | POA: Diagnosis not present

## 2024-09-02 DIAGNOSIS — Z8249 Family history of ischemic heart disease and other diseases of the circulatory system: Secondary | ICD-10-CM | POA: Diagnosis not present

## 2024-09-02 DIAGNOSIS — F17210 Nicotine dependence, cigarettes, uncomplicated: Secondary | ICD-10-CM | POA: Diagnosis not present

## 2024-09-02 DIAGNOSIS — Z720 Tobacco use: Secondary | ICD-10-CM | POA: Diagnosis not present

## 2024-09-02 DIAGNOSIS — K760 Fatty (change of) liver, not elsewhere classified: Secondary | ICD-10-CM | POA: Diagnosis present

## 2024-09-02 DIAGNOSIS — Z59868 Other specified financial insecurity: Secondary | ICD-10-CM

## 2024-09-02 DIAGNOSIS — K5792 Diverticulitis of intestine, part unspecified, without perforation or abscess without bleeding: Principal | ICD-10-CM | POA: Diagnosis present

## 2024-09-02 DIAGNOSIS — K429 Umbilical hernia without obstruction or gangrene: Secondary | ICD-10-CM | POA: Diagnosis not present

## 2024-09-02 DIAGNOSIS — I700 Atherosclerosis of aorta: Secondary | ICD-10-CM | POA: Diagnosis present

## 2024-09-02 DIAGNOSIS — F419 Anxiety disorder, unspecified: Secondary | ICD-10-CM

## 2024-09-02 DIAGNOSIS — M2041 Other hammer toe(s) (acquired), right foot: Secondary | ICD-10-CM | POA: Diagnosis not present

## 2024-09-02 DIAGNOSIS — Z9981 Dependence on supplemental oxygen: Secondary | ICD-10-CM

## 2024-09-02 DIAGNOSIS — M2011 Hallux valgus (acquired), right foot: Secondary | ICD-10-CM | POA: Diagnosis not present

## 2024-09-02 DIAGNOSIS — K632 Fistula of intestine: Secondary | ICD-10-CM | POA: Diagnosis present

## 2024-09-02 DIAGNOSIS — M79671 Pain in right foot: Secondary | ICD-10-CM | POA: Diagnosis not present

## 2024-09-02 DIAGNOSIS — R188 Other ascites: Secondary | ICD-10-CM | POA: Diagnosis not present

## 2024-09-02 DIAGNOSIS — J449 Chronic obstructive pulmonary disease, unspecified: Secondary | ICD-10-CM

## 2024-09-02 DIAGNOSIS — Z90710 Acquired absence of both cervix and uterus: Secondary | ICD-10-CM | POA: Diagnosis not present

## 2024-09-02 DIAGNOSIS — J439 Emphysema, unspecified: Secondary | ICD-10-CM | POA: Diagnosis present

## 2024-09-02 DIAGNOSIS — E785 Hyperlipidemia, unspecified: Secondary | ICD-10-CM | POA: Diagnosis present

## 2024-09-02 DIAGNOSIS — J9611 Chronic respiratory failure with hypoxia: Secondary | ICD-10-CM | POA: Diagnosis present

## 2024-09-02 DIAGNOSIS — Z79899 Other long term (current) drug therapy: Secondary | ICD-10-CM

## 2024-09-02 DIAGNOSIS — Z9049 Acquired absence of other specified parts of digestive tract: Secondary | ICD-10-CM

## 2024-09-02 DIAGNOSIS — D509 Iron deficiency anemia, unspecified: Secondary | ICD-10-CM | POA: Diagnosis not present

## 2024-09-02 DIAGNOSIS — K5730 Diverticulosis of large intestine without perforation or abscess without bleeding: Secondary | ICD-10-CM | POA: Diagnosis not present

## 2024-09-02 DIAGNOSIS — N136 Pyonephrosis: Secondary | ICD-10-CM | POA: Diagnosis not present

## 2024-09-02 DIAGNOSIS — Z716 Tobacco abuse counseling: Secondary | ICD-10-CM

## 2024-09-02 DIAGNOSIS — Z885 Allergy status to narcotic agent status: Secondary | ICD-10-CM

## 2024-09-02 DIAGNOSIS — Z881 Allergy status to other antibiotic agents status: Secondary | ICD-10-CM

## 2024-09-02 DIAGNOSIS — K589 Irritable bowel syndrome without diarrhea: Secondary | ICD-10-CM | POA: Diagnosis not present

## 2024-09-02 DIAGNOSIS — M7989 Other specified soft tissue disorders: Secondary | ICD-10-CM | POA: Diagnosis not present

## 2024-09-02 DIAGNOSIS — R1032 Left lower quadrant pain: Secondary | ICD-10-CM | POA: Diagnosis not present

## 2024-09-02 DIAGNOSIS — E440 Moderate protein-calorie malnutrition: Secondary | ICD-10-CM | POA: Diagnosis not present

## 2024-09-02 DIAGNOSIS — Z5941 Food insecurity: Secondary | ICD-10-CM

## 2024-09-02 DIAGNOSIS — R32 Unspecified urinary incontinence: Secondary | ICD-10-CM

## 2024-09-02 DIAGNOSIS — N3090 Cystitis, unspecified without hematuria: Secondary | ICD-10-CM | POA: Diagnosis not present

## 2024-09-02 DIAGNOSIS — N1330 Unspecified hydronephrosis: Secondary | ICD-10-CM | POA: Diagnosis not present

## 2024-09-02 DIAGNOSIS — M19071 Primary osteoarthritis, right ankle and foot: Secondary | ICD-10-CM | POA: Diagnosis not present

## 2024-09-02 DIAGNOSIS — R7303 Prediabetes: Secondary | ICD-10-CM | POA: Diagnosis present

## 2024-09-02 LAB — URINALYSIS, W/ REFLEX TO CULTURE (INFECTION SUSPECTED)
Bilirubin Urine: NEGATIVE
Glucose, UA: NEGATIVE mg/dL
Ketones, ur: 5 mg/dL — AB
Nitrite: NEGATIVE
Protein, ur: 100 mg/dL — AB
Specific Gravity, Urine: 1.017 (ref 1.005–1.030)
WBC, UA: >50 WBC/hpf (ref 0–5)
pH: 5 (ref 5.0–8.0)

## 2024-09-02 LAB — BASIC METABOLIC PANEL WITH GFR
Anion gap: 9 (ref 5–15)
BUN: 11 mg/dL (ref 8–23)
CO2: 32 mmol/L (ref 22–32)
Calcium: 9.6 mg/dL (ref 8.9–10.3)
Chloride: 96 mmol/L — ABNORMAL LOW (ref 98–111)
Creatinine, Ser: 0.57 mg/dL (ref 0.44–1.00)
GFR, Estimated: >60 mL/min (ref >60)
Glucose, Bld: 85 mg/dL (ref 70–99)
Potassium: 4 mmol/L (ref 3.5–5.1)
Sodium: 137 mmol/L (ref 135–145)

## 2024-09-02 LAB — CBC
HCT: 37.6 % (ref 36.0–46.0)
Hemoglobin: 12 g/dL (ref 12.0–15.0)
MCH: 29.1 pg (ref 26.0–34.0)
MCHC: 31.9 g/dL (ref 30.0–36.0)
MCV: 91 fL (ref 80.0–100.0)
Platelets: 568 K/uL — ABNORMAL HIGH (ref 150–400)
RBC: 4.13 MIL/uL (ref 3.87–5.11)
RDW: 13.2 % (ref 11.5–15.5)
WBC: 11.4 K/uL — ABNORMAL HIGH (ref 4.0–10.5)
nRBC: 0 % (ref 0.0–0.2)

## 2024-09-02 MED ORDER — ALBUTEROL SULFATE (2.5 MG/3ML) 0.083% IN NEBU
2.5000 mg | INHALATION_SOLUTION | Freq: Once | RESPIRATORY_TRACT | Status: AC
  Administered 2024-09-02: 2.5 mg via RESPIRATORY_TRACT
  Filled 2024-09-02: qty 3

## 2024-09-02 MED ORDER — SODIUM CHLORIDE 0.9 % IV SOLN
1.0000 g | Freq: Once | INTRAVENOUS | Status: AC
  Administered 2024-09-02: 1 g via INTRAVENOUS
  Filled 2024-09-02: qty 10

## 2024-09-02 MED ORDER — SODIUM CHLORIDE 0.9 % IV SOLN
2.0000 g | INTRAVENOUS | Status: AC
  Administered 2024-09-03 – 2024-09-15 (×13): 2 g via INTRAVENOUS
  Filled 2024-09-02 (×13): qty 20

## 2024-09-02 MED ORDER — ALPRAZOLAM 0.25 MG PO TABS
0.2500 mg | ORAL_TABLET | Freq: Once | ORAL | Status: AC
  Administered 2024-09-02: 0.25 mg via ORAL
  Filled 2024-09-02: qty 1

## 2024-09-02 MED ORDER — METRONIDAZOLE 500 MG/100ML IV SOLN
500.0000 mg | Freq: Once | INTRAVENOUS | Status: AC
  Administered 2024-09-02: 500 mg via INTRAVENOUS
  Filled 2024-09-02: qty 100

## 2024-09-02 MED ORDER — ALBUTEROL SULFATE (2.5 MG/3ML) 0.083% IN NEBU: 2.5000 mg | INHALATION_SOLUTION | RESPIRATORY_TRACT | Status: DC | PRN

## 2024-09-02 MED ORDER — ONDANSETRON HCL 4 MG/2ML IJ SOLN: 4.0000 mg | Freq: Four times a day (QID) | INTRAMUSCULAR | Status: DC | PRN

## 2024-09-02 MED ORDER — ONDANSETRON HCL 4 MG PO TABS
4.0000 mg | ORAL_TABLET | Freq: Four times a day (QID) | ORAL | Status: DC | PRN
  Administered 2024-09-05 – 2024-09-11 (×2): 4 mg via ORAL
  Filled 2024-09-02 (×2): qty 1

## 2024-09-02 MED ORDER — IPRATROPIUM-ALBUTEROL 0.5-2.5 (3) MG/3ML IN SOLN: 3.0000 mL | Freq: Four times a day (QID) | RESPIRATORY_TRACT | Status: DC | PRN

## 2024-09-02 MED ORDER — SODIUM CHLORIDE 0.9 % IV SOLN: INTRAVENOUS | Status: DC

## 2024-09-02 MED ORDER — ACETAMINOPHEN 650 MG RE SUPP: 650.0000 mg | Freq: Four times a day (QID) | RECTAL | Status: DC | PRN

## 2024-09-02 MED ORDER — BUDESON-GLYCOPYRROL-FORMOTEROL 160-9-4.8 MCG/ACT IN AERO
2.0000 | INHALATION_SPRAY | Freq: Two times a day (BID) | RESPIRATORY_TRACT | Status: DC
  Administered 2024-09-02 – 2024-09-16 (×28): 2 via RESPIRATORY_TRACT
  Filled 2024-09-02 (×3): qty 5.9

## 2024-09-02 MED ORDER — ALBUTEROL SULFATE (2.5 MG/3ML) 0.083% IN NEBU
2.5000 mg | INHALATION_SOLUTION | Freq: Four times a day (QID) | RESPIRATORY_TRACT | Status: DC
  Administered 2024-09-03 (×2): 2.5 mg via RESPIRATORY_TRACT
  Filled 2024-09-02 (×2): qty 3

## 2024-09-02 MED ORDER — SODIUM CHLORIDE 0.9% FLUSH
3.0000 mL | Freq: Two times a day (BID) | INTRAVENOUS | Status: DC
  Administered 2024-09-02 – 2024-09-16 (×27): 3 mL via INTRAVENOUS

## 2024-09-02 MED ORDER — ACETAMINOPHEN 325 MG PO TABS
650.0000 mg | ORAL_TABLET | Freq: Four times a day (QID) | ORAL | Status: DC | PRN
  Administered 2024-09-04 – 2024-09-16 (×19): 650 mg via ORAL
  Filled 2024-09-02 (×19): qty 2

## 2024-09-02 MED ORDER — METRONIDAZOLE 500 MG/100ML IV SOLN
500.0000 mg | Freq: Two times a day (BID) | INTRAVENOUS | Status: DC
  Administered 2024-09-03 – 2024-09-15 (×26): 500 mg via INTRAVENOUS
  Filled 2024-09-02 (×27): qty 100

## 2024-09-02 MED ORDER — ALPRAZOLAM 0.25 MG PO TABS
0.2500 mg | ORAL_TABLET | Freq: Two times a day (BID) | ORAL | Status: DC | PRN
  Administered 2024-09-03 – 2024-09-16 (×19): 0.25 mg via ORAL
  Filled 2024-09-02 (×19): qty 1

## 2024-09-02 NOTE — Telephone Encounter (Signed)
FYI patient is currently being seen in the ED

## 2024-09-02 NOTE — Progress Notes (Signed)
  Because of the symptoms you are experiencing, I feel your condition warrants further evaluation and I recommend that you be seen in a face-to-face visit for a thorough exam to see if you are having prolapse.   NOTE: There will be NO CHARGE for this E-Visit   If you are having a true medical emergency, please call 911.     For an urgent face to face visit, [place] has multiple urgent care centers for your convenience.  Click the link below for the full list of locations and hours, walk-in wait times, appointment scheduling options and driving directions:  Urgent Care - [place], [place], [place], [place], [place], [place]  [place]     Your MyChart E-visit questionnaire answers were reviewed by a board certified advanced clinical practitioner to complete your personal care plan based on your specific symptoms.    Thank you for using e-Visits.

## 2024-09-02 NOTE — Telephone Encounter (Signed)
 FYI Only or Action Required?: FYI only for provider: ED advised.  Patient was last seen in primary care on 07/17/2024 by Joshua Debby CROME, MD.  Called Nurse Triage reporting Foreign Body in Vagina.  Symptoms began several days ago.  Interventions attempted: Nothing.  Symptoms are: gradually worsening.  Triage Disposition: Go to ED Now (or PCP Triage)  Patient/caregiver understands and will follow disposition?: Yes  Copied from CRM #[id number]. Topic: Clinical - Red Word Triage >> Sep 02, 2024  7:56 AM Carlyon D wrote: Red Word that prompted transfer to Nurse Triage: pt states she feels her bladder is falling down she can not control her urine as it just comes out and has absolutely no control, pt also has sever pain in the  the lower left side, no appetite.   Reason for Disposition  Patient sounds very sick or weak to the triager    Patient states she has had to wear Depends undergarments due to severe incontinence, and feels as if she is dehydrated due to dry lips, dry mouth. The patient also complains of moderate to severe hip and pelvic pain, along with feeling as if an organ is in her vaginal canal.  Answer Assessment - Initial Assessment Questions 1. OBJECT: What do you think it is?      Bladder  2. SIZE: How large is it?      Unsure  3. PAIN: Are you having any pain? If Yes, ask: How bad is it?  (Scale 1-10; or mild, moderate, severe)     Yes, Left sided Hip and Pelvic Pain, 6  4. ONSET: How long do you think the foreign body has been there?      3-4 Days ago  5. MECHANISM: Tell me how it happened.     Unsure  6. OTHER SYMPTOMS: Do you have any other symptoms? (e.g., vaginal discharge, fever, rash)     Urinary Incontinence, Loss of Appetite, Dry Lips, Dry Mouth  7. PREGNANCY: Is there any chance you are pregnant? When was your last menstrual period?     No and No  Protocols used: Vaginal Foreign Body-A-AH

## 2024-09-02 NOTE — ED Triage Notes (Signed)
 Pt states she is unable to control her bladder, left groin pain that is not consistent, feels like her bladder is protruding, no appetite. States this has been going on for the past couple of weeks with it getting worse the past couple of days.

## 2024-09-02 NOTE — H&P (Signed)
 History and Physical    Patient: Lynn Kline FMW:[id number] DOB: [birth year] DOA: 09/02/2024 DOS: the patient was seen and examined on 09/02/2024 PCP: [name], MD  Patient coming from: Home  Chief Complaint:  Chief Complaint  Patient presents with   Urinary Frequency   HPI: Lynn Kline is a 68 y.o. female with medical history significant for emphysema and anxiety who presented to the hospital today because of pain in her lower abdomen.  She said it felt like there was a ball in her abdomen that kept her from having a bowel movement.  She also says she has been incontinent of urine which is not her baseline and it felt like her bladder was falling out.  This has been bad over the last couple of days but has been probably been going on for few weeks.  She was worried that she had a bladder infection.  She has never had this happen before. She is not vomiting or having diarrhea or having severe fevers or chills. In the emergency department the patient's CT scan Revealed diverticulitis and a fistula between the intestines and bladder.  There may be free air as well.  Dental surgery was consulted by the emergency department physician.  The patient does not have an acute abdomen on physical examination.  She will be admitted by the hospitalist service at this time.  Review of Systems: As mentioned in the history of present illness. All other systems reviewed and are negative. Past Medical History:  Diagnosis Date   Anxiety    Atherosclerosis    Colon polyp, hyperplastic    COPD (chronic obstructive pulmonary disease) (HCC)    Diverticulosis    Gallstone    Pneumonia    Past Surgical History:  Procedure Laterality Date   ABDOMINAL HYSTERECTOMY     BRONCHIAL BIOPSY  11/03/2020   Procedure: BRONCHIAL BIOPSIES;  Surgeon: [name], DO;  Location: [facility];  Service: Pulmonary;;   BRONCHIAL BRUSHINGS  11/03/2020   Procedure: BRONCHIAL BRUSHINGS;  Surgeon: [name], DO;  Location: [facility];  Service: Pulmonary;;   BRONCHIAL NEEDLE ASPIRATION BIOPSY  11/03/2020   Procedure: BRONCHIAL NEEDLE ASPIRATION BIOPSIES;  Surgeon: [name], DO;  Location: [facility];  Service: Pulmonary;;   BRONCHIAL WASHINGS  11/03/2020   Procedure: BRONCHIAL WASHINGS;  Surgeon: [name], DO;  Location: [facility];  Service: Pulmonary;;   CESAREAN SECTION     CHOLECYSTECTOMY     COLONOSCOPY     FIDUCIAL MARKER PLACEMENT  11/03/2020   Procedure: FIDUCIAL MARKER PLACEMENT;  Surgeon: [name], DO;  Location: [facility];  Service: Pulmonary;;   VIDEO BRONCHOSCOPY WITH ENDOBRONCHIAL NAVIGATION N/A 11/03/2020   Procedure: VIDEO BRONCHOSCOPY WITH ENDOBRONCHIAL NAVIGATION AND POSSIBLE ULTRASOUND;  Surgeon: [name], DO;  Location: [facility];  Service: Pulmonary;  Laterality: N/A;   VIDEO BRONCHOSCOPY WITH ENDOBRONCHIAL ULTRASOUND  11/03/2020   Procedure: VIDEO BRONCHOSCOPY WITH ENDOBRONCHIAL ULTRASOUND;  Surgeon: [name], DO;  Location: [facility];  Service: Pulmonary;;   Social History:  reports that she quit smoking about 2 years ago. Her smoking use included cigarettes. She has been exposed to tobacco smoke. She has never used smokeless tobacco. She reports that she does not drink alcohol and does not use drugs.  Allergies  Allergen Reactions   Codeine Nausea And Vomiting   Erythromycin Nausea And Vomiting   Ampicillin Nausea And Vomiting, Rash and Other (See Comments)    Pt states that  rash was on her tongue.  Has patient had a PCN reaction causing immediate rash, facial/tongue/throat swelling, SOB or lightheadedness with hypotension yes Has patient had a PCN reaction causing severe rash involving mucus membranes or skin necrosis: no Has patient had a PCN reaction that required hospitalization yes Has patient had a PCN reaction occurring within the last 10 years: no If all of the above answers are NO, then may proceed  with Cephalosporin use.     Family History  Problem Relation Age of Onset   Arthritis Mother    COPD Mother    Emphysema Mother    Stroke Father    Heart disease Father    Cancer Father        MELANOMA   Breast cancer Maternal Grandmother    Colon cancer Neg Hx     Prior to Admission medications   Medication Sig Start Date End Date Taking? Authorizing Provider  albuterol  (PROVENTIL ) (2.5 MG/3ML) 0.083% nebulizer solution USE 3 ML VIA NEBULIZER EVERY 4 HOURS AS NEEDED FOR WHEEZING OR SHORTNESS OF BREATH 05/27/24   Joshua Debby CROME, MD  ALPRAZolam  (XANAX ) 0.25 MG tablet TAKE 1 TABLET(0.25 MG) BY MOUTH TWICE DAILY AS NEEDED 07/08/24   Joshua Debby CROME, MD  budesonide -glycopyrrolate-formoterol (BREZTRI  AEROSPHERE) 160-9-4.8 MCG/ACT AERO inhaler Inhale 2 puffs into the lungs in the morning and at bedtime. 04/12/24   Cobb, Comer GAILS, NP  cholecalciferol (VITAMIN D3) 25 MCG (1000 UNIT) tablet Take 1,000 Units by mouth daily.    [provider]  cyanocobalamin  2000 MCG tablet Take 1 tablet (2,000 mcg total) by mouth daily. 07/18/24   Joshua Debby CROME, MD  naproxen sodium (ALEVE) 220 MG tablet Take 220 mg by mouth 2 (two) times daily as needed.    [provider]  OXYGEN  Inhale 3 L into the lungs at bedtime. 3L at bedtime and prn during day    [provider]  rosuvastatin (CRESTOR) 10 MG tablet Take 1 tablet (10 mg total) by mouth daily. 07/17/24   Joshua Debby CROME, MD  VENTOLIN  HFA 108 (90 Base) MCG/ACT inhaler INHALE 1 TO 2 PUFFS INTO THE LUNGS EVERY 6 HOURS AS NEEDED FOR WHEEZING OR SHORTNESS OF BREATH 07/05/24   Joshua Debby CROME, MD    Physical Exam: Vitals:   09/02/24 1031 09/02/24 1600 09/02/24 1643  BP: 134/84 (!) 145/85 126/79  Pulse: (!) 101 87 82  Resp: 18  17  Temp: 98.2 F (36.8 C)  98 F (36.7 C)  TempSrc: Oral  Oral  SpO2: 93% 100% 100%   Physical Exam:  General: No acute distress, well developed, kyphotic HEENT: Normocephalic, atraumatic,  PERRL Cardiovascular: Normal rhythm, tachycardic. Distal pulses intact. Pulmonary: Normal pulmonary effort, mild end expiratory wheezing, prolonged expiratory phase Gastrointestinal: Distended abdomen, soft, tender in LLQ, normoactive bowel sounds Musculoskeletal:Normal ROM, no lower ext edema Lymphadenopathy: No cervical LAD. Skin: Skin is warm and dry. Neuro: No focal deficits noted, AAOx3. PSYCH: Attentive and cooperative  Data Reviewed:  Results for orders placed or performed during the hospital encounter of 09/02/24 (from the past 24 hours)  Urinalysis, w/ Reflex to Culture (Infection Suspected) -Urine, Clean Catch     Status: Abnormal   Collection Time: 09/02/24 11:03 AM  Result Value Ref Range   Specimen Source URINE, CLEAN CATCH    Color, Urine YELLOW YELLOW   APPearance CLOUDY (A) CLEAR   Specific Gravity, Urine 1.017 1.005 - 1.030   pH 5.0 5.0 - 8.0   Glucose, UA NEGATIVE NEGATIVE  mg/dL   Hgb urine dipstick SMALL (A) NEGATIVE   Bilirubin Urine NEGATIVE NEGATIVE   Ketones, ur 5 (A) NEGATIVE mg/dL   Protein, ur 899 (A) NEGATIVE mg/dL   Nitrite NEGATIVE NEGATIVE   Leukocytes,Ua LARGE (A) NEGATIVE   RBC / HPF 11-20 0 - 5 RBC/hpf   WBC, UA >50 0 - 5 WBC/hpf   Bacteria, UA FEW (A) NONE SEEN   Squamous Epithelial / HPF 0-5 0 - 5 /HPF   WBC Clumps PRESENT    Mucus PRESENT   Basic metabolic panel     Status: Abnormal   Collection Time: 09/02/24 11:31 AM  Result Value Ref Range   Sodium 137 135 - 145 mmol/L   Potassium 4.0 3.5 - 5.1 mmol/L   Chloride 96 (L) 98 - 111 mmol/L   CO2 32 22 - 32 mmol/L   Glucose, Bld 85 70 - 99 mg/dL   BUN 11 8 - 23 mg/dL   Creatinine, Ser 9.42 0.44 - 1.00 mg/dL   Calcium  9.6 8.9 - 10.3 mg/dL   GFR, Estimated >39 >39 mL/min   Anion gap 9 5 - 15  CBC     Status: Abnormal   Collection Time: 09/02/24 11:31 AM  Result Value Ref Range   WBC 11.4 (H) 4.0 - 10.5 K/uL   RBC 4.13 3.87 - 5.11 MIL/uL   Hemoglobin 12.0 12.0 - 15.0 g/dL   HCT 62.3  63.9 - 53.9 %   MCV 91.0 80.0 - 100.0 fL   MCH 29.1 26.0 - 34.0 pg   MCHC 31.9 30.0 - 36.0 g/dL   RDW 86.7 88.4 - 84.4 %   Platelets 568 (H) 150 - 400 K/uL   nRBC 0.0 0.0 - 0.2 %      IMPRESSION: 1. Findings suspicious for sigmoid diverticulitis, complicated by fistulous communication to the bladder with secondary cystitis. Extraluminal gas and soft tissue traversing the left hemipelvis, possibly involving the left ovary. This is suboptimally evaluated secondary to stone study technique. Consider dedicated postcontrast abdominal pelvic CT. 2. No urinary tract calculi or hydronephrosis. 3. Aortic atherosclerosis (ICD10-I70.0).  Assessment and Plan: Sigmoid diverticulitis/ fistula with bladder/ possible free air - General Surgery consulted - Urology consulted - Continue Rocephin and Flagyl started in the ED - N.p.o. - IV fluids - Supportive care.  She is not requiring pain medication at this time  2.  Emphysema without exacerbation She is at her baseline mild daily wheeze. - Continue Breztri  and albuterol  nebulizer treatments  3. Anxiety - She is on Xanax  bid prn, but she takes it everyday  4. HLD - holding Crestor until after surgery   Advance Care Planning:   Code Status: Prior   Consults: gen surgery, urology  Family Communication: son and friend present  Severity of Illness: The appropriate patient status for this patient is INPATIENT. Inpatient status is judged to be reasonable and necessary in order to provide the required intensity of service to ensure the patient's safety. The patient's presenting symptoms, physical exam findings, and initial radiographic and laboratory data in the context of their chronic comorbidities is felt to place them at high risk for further clinical deterioration. Furthermore, it is not anticipated that the patient will be medically stable for discharge from the hospital within 2 midnights of admission.   * I certify that at the point of  admission it is my clinical judgment that the patient will require inpatient hospital care spanning beyond 2 midnights from the point of admission due to  high intensity of service, high risk for further deterioration and high frequency of surveillance required.*  Author: ARTHEA CHILD, MD 09/02/2024 7:14 PM  For on call review [email or web address].

## 2024-09-02 NOTE — ED Provider Notes (Signed)
 [place] EMERGENCY DEPARTMENT AT [facility] Provider Note   CSN: [id number] Arrival date & time: 09/02/24  1017     Patient presents with: Urinary Frequency   Lynn Kline is a 68 y.o. female.    Urinary Frequency     Pt has a history of COPD, Gallstones, Diverticulosis.  Pt states she has lost control of her bladder.  SHe is having pain in the left side of her abdomen.  It comes and goes.   The sx were ongoing for a few weeks.  She has not seen anyone for this.  No fever or vomiting.  Symptoms however worsening now.  She has not noticed anything protruding from her vagina or has felt anything protruding but she gets a sensation as everything is being pushed down there.  The symptoms have been getting worse the last couple of days.  Prior to Admission medications   Medication Sig Start Date End Date Taking? Authorizing Provider  albuterol  (PROVENTIL ) (2.5 MG/3ML) 0.083% nebulizer solution USE 3 ML VIA NEBULIZER EVERY 4 HOURS AS NEEDED FOR WHEEZING OR SHORTNESS OF BREATH 05/27/24   [name], MD  ALPRAZolam  (XANAX ) 0.25 MG tablet TAKE 1 TABLET(0.25 MG) BY MOUTH TWICE DAILY AS NEEDED 07/08/24   [name], MD  budesonide -glycopyrrolate-formoterol (BREZTRI  AEROSPHERE) 160-9-4.8 MCG/ACT AERO inhaler Inhale 2 puffs into the lungs in the morning and at bedtime. 04/12/24   Cobb, Comer GAILS, NP  cholecalciferol (VITAMIN D3) 25 MCG (1000 UNIT) tablet Take 1,000 Units by mouth daily.    [provider]  cyanocobalamin  2000 MCG tablet Take 1 tablet (2,000 mcg total) by mouth daily. 07/18/24   [name], MD  naproxen sodium (ALEVE) 220 MG tablet Take 220 mg by mouth 2 (two) times daily as needed.    [provider]  OXYGEN  Inhale 3 L into the lungs at bedtime. 3L at bedtime and prn during day    [provider]  rosuvastatin (CRESTOR) 10 MG tablet Take 1 tablet (10 mg total) by mouth daily. 07/17/24   [name], MD  VENTOLIN  HFA 108  (90 Base) MCG/ACT inhaler INHALE 1 TO 2 PUFFS INTO THE LUNGS EVERY 6 HOURS AS NEEDED FOR WHEEZING OR SHORTNESS OF BREATH 07/05/24   [name], MD    Allergies: Codeine, Erythromycin, and Ampicillin    Review of Systems  Genitourinary:  Positive for frequency.    Updated Vital Signs BP 126/79 (BP Location: Left Arm)   Pulse 82   Temp 98 F (36.7 C) (Oral)   Resp 17   SpO2 100%   Physical Exam Vitals and nursing note reviewed.  Constitutional:      General: She is not in acute distress.    Appearance: She is well-developed.  HENT:     Head: Normocephalic and atraumatic.     Right Ear: External ear normal.     Left Ear: External ear normal.  Eyes:     General: No scleral icterus.       Right eye: No discharge.        Left eye: No discharge.     Conjunctiva/sclera: Conjunctivae normal.  Neck:     Trachea: No tracheal deviation.  Cardiovascular:     Rate and Rhythm: Normal rate and regular rhythm.  Pulmonary:     Effort: Pulmonary effort is normal. No respiratory distress.     Breath sounds: Normal breath sounds. No stridor. No wheezing or rales.  Abdominal:  General: Bowel sounds are normal. There is no distension.     Palpations: Abdomen is soft.     Tenderness: There is abdominal tenderness. There is no guarding or rebound.     Comments: Tenderness to palpation left lower quadrant suprapubic region  Musculoskeletal:        General: No tenderness or deformity.     Cervical back: Neck supple.  Skin:    General: Skin is warm and dry.     Findings: No rash.  Neurological:     General: No focal deficit present.     Mental Status: She is alert.     Cranial Nerves: No cranial nerve deficit, dysarthria or facial asymmetry.     Sensory: No sensory deficit.     Motor: No abnormal muscle tone or seizure activity.     Coordination: Coordination normal.  Psychiatric:        Mood and Affect: Mood normal.     (all labs ordered are listed, but only abnormal results  are displayed) Labs Reviewed  BASIC METABOLIC PANEL WITH GFR - Abnormal; Notable for the following components:      Result Value   Chloride 96 (*)    All other components within normal limits  CBC - Abnormal; Notable for the following components:   WBC 11.4 (*)    Platelets 568 (*)    All other components within normal limits  URINALYSIS, W/ REFLEX TO CULTURE (INFECTION SUSPECTED) - Abnormal; Notable for the following components:   APPearance CLOUDY (*)    Hgb urine dipstick SMALL (*)    Ketones, ur 5 (*)    Protein, ur 100 (*)    Leukocytes,Ua LARGE (*)    Bacteria, UA FEW (*)    All other components within normal limits  URINE CULTURE    EKG: None  Radiology: CT Renal Stone Study Result Date: 09/02/2024 EXAM: CT ABDOMEN AND PELVIS WITHOUT CONTRAST 09/02/2024 04:11:31 PM TECHNIQUE: CT of the abdomen and pelvis was performed without the administration of intravenous contrast. Multiplanar reformatted images are provided for review. Automated exposure control, iterative reconstruction, and/or weight-based adjustment of the mA/kV was utilized to reduce the radiation dose to as low as reasonably achievable. COMPARISON: CT dated 08/07/2014. CLINICAL HISTORY: Abdominal/flank pain, stone suspected; left sided abd pain. FINDINGS: LOWER CHEST: Normal heart size. Clear lung bases. LIVER: Normal, without mass or intrahepatic biliary duct dilatation. GALLBLADDER AND BILE DUCTS: Cholecystectomy. No biliary ductal dilatation. SPLEEN: Normal in size and morphology. PANCREAS: Normal, without duct dilatation or acute inflammation. ADRENAL GLANDS: No acute abnormality. KIDNEYS, URETERS AND BLADDER: No renal mass or hydronephrosis. No hydroureter or ureteric stone. No renal calculi. Mild bladder wall thickening and moderate pericystic edema including on image 64 of series 2 .subtle gas in the nondependent bladder. GI AND BOWEL: Normal stomach, without wall thickening. Normal small bowel caliber. Scattered  colonic diverticula. Wall thickening involving the sigmoid is moderate. Mild edema surrounds the proximal sigmoid including on 54 of series 2. Extraluminal soft tissue density and gas tract from the inflamed sigmoid colon to the urinary bladder, including on images 54 through 58 of series 2. Soft tissue in this region on image 54 may represent the left ovary. Normal appendix. PERITONEUM AND RETROPERITONEUM: No ascites. No free air. VASCULATURE: Aorta is normal in caliber. Aortic atherosclerosis. LYMPH NODES: No lymphadenopathy. REPRODUCTIVE ORGANS: Hysterectomy. BONES AND SOFT TISSUES: Mild osteopenia. Multiple lumbar and lumbosacral disc bulges. No acute osseous abnormality. No focal soft tissue abnormality. IMPRESSION: 1. Findings suspicious for  sigmoid diverticulitis, complicated by fistulous communication to the bladder with secondary cystitis. Extraluminal gas and soft tissue traversing the left hemipelvis, possibly involving the left ovary. This is suboptimally evaluated secondary to stone study technique. Consider dedicated postcontrast abdominal pelvic CT. 2. No urinary tract calculi or hydronephrosis. 3. Aortic atherosclerosis (ICD10-I70.0). Electronically signed by: Rockey Kilts MD 09/02/2024 04:51 PM EST RP Workstation: [id number]     Procedures   Medications Ordered in the ED  metroNIDAZOLE (FLAGYL) IVPB 500 mg (has no administration in time range)  ipratropium-albuterol  (DUONEB) 0.5-2.5 (3) MG/3ML nebulizer solution 3 mL (has no administration in time range)  cefTRIAXone (ROCEPHIN) 1 g in sodium chloride  0.9 % 100 mL IVPB (1 g Intravenous New Bag/Given 09/02/24 1629)    Clinical Course as of 09/02/24 1751  Mon Sep 02, 2024  1702 CT scan shows findings concerning for diverticulitis with possible fistulous communication to the bladder  No signs of urinary tract infection [JK]  1720 Case discussed with Dr. Arthea regarding admission [JK]  1741 Case reviewed with Dr. Curvin.  He recommends  urology and GI consultation.  General surgery will see patient in consultation [JK]  1742 I have sent a secure chat message to  GI Dr. Shila.  I will consult with urology [JK]  [phone] Case discussed with Dr. Norva urology.  They will see patient in consultation.  Recommend Foley catheter [JK]    Clinical Course User Index [JK] Randol Simmonds, MD                                 Medical Decision Making Differential diagnosis includes but not limited to urinary tract infection, kidney stone colitis, colonic mass  Problems Addressed: Diverticulitis: acute illness or injury that poses a threat to life or bodily functions  Amount and/or Complexity of Data Reviewed Labs: ordered. Decision-making details documented in ED Course. Radiology: ordered and independent interpretation performed.  Risk Prescription drug management. Decision regarding hospitalization.   Patient presented to ED with complaints of increasing abdominal pain.  Patient has also had urinary discomfort and is felt a pressure in her lower abdomen.  Patient's initial labs did show slight leukocytosis.  No signs of acute kidney injury.  Urinalysis did suggest the possibility of a UTI but with her level of pain and discomfort CT scan was ordered for further evaluation.  CT scan shows findings concerning for diverticulitis complicated by fistulous communication to the bladder.  Images were limited by the fact patient was not given any contrast however this does correlate with her symptoms.  I have started the patient on IV antibiotics.  With the complicating features I will consult with general surgery and the medical service for admission and further treatment.       Final diagnoses:  Diverticulitis    ED Discharge Orders     None          Randol Simmonds, MD 09/02/24 1751

## 2024-09-03 DIAGNOSIS — K5732 Diverticulitis of large intestine without perforation or abscess without bleeding: Secondary | ICD-10-CM | POA: Diagnosis not present

## 2024-09-03 DIAGNOSIS — K632 Fistula of intestine: Secondary | ICD-10-CM | POA: Diagnosis not present

## 2024-09-03 DIAGNOSIS — J449 Chronic obstructive pulmonary disease, unspecified: Secondary | ICD-10-CM

## 2024-09-03 LAB — GLUCOSE, CAPILLARY
Glucose-Capillary: 37 mg/dL — CL (ref 70–99)
Glucose-Capillary: 122 mg/dL — ABNORMAL HIGH (ref 70–99)
Glucose-Capillary: 192 mg/dL — ABNORMAL HIGH (ref 70–99)
Glucose-Capillary: 124 mg/dL — ABNORMAL HIGH (ref 70–99)
Glucose-Capillary: 133 mg/dL — ABNORMAL HIGH (ref 70–99)

## 2024-09-03 LAB — BASIC METABOLIC PANEL WITH GFR
Anion gap: 12 (ref 5–15)
BUN: 14 mg/dL (ref 8–23)
CO2: 28 mmol/L (ref 22–32)
Calcium: 9 mg/dL (ref 8.9–10.3)
Chloride: 99 mmol/L (ref 98–111)
Creatinine, Ser: 0.51 mg/dL (ref 0.44–1.00)
GFR, Estimated: >60 mL/min (ref >60)
Glucose, Bld: 40 mg/dL — CL (ref 70–99)
Potassium: 3.8 mmol/L (ref 3.5–5.1)
Sodium: 139 mmol/L (ref 135–145)

## 2024-09-03 LAB — CBC
HCT: 34.4 % — ABNORMAL LOW (ref 36.0–46.0)
Hemoglobin: 10.8 g/dL — ABNORMAL LOW (ref 12.0–15.0)
MCH: 28.9 pg (ref 26.0–34.0)
MCHC: 31.4 g/dL (ref 30.0–36.0)
MCV: 92 fL (ref 80.0–100.0)
Platelets: 530 K/uL — ABNORMAL HIGH (ref 150–400)
RBC: 3.74 MIL/uL — ABNORMAL LOW (ref 3.87–5.11)
RDW: 13.3 % (ref 11.5–15.5)
WBC: 9.9 K/uL (ref 4.0–10.5)
nRBC: 0 % (ref 0.0–0.2)

## 2024-09-03 LAB — MAGNESIUM: Magnesium: 2.3 mg/dL (ref 1.7–2.4)

## 2024-09-03 LAB — URINE CULTURE

## 2024-09-03 MED ORDER — ALBUTEROL SULFATE (2.5 MG/3ML) 0.083% IN NEBU
2.5000 mg | INHALATION_SOLUTION | Freq: Three times a day (TID) | RESPIRATORY_TRACT | Status: DC
  Administered 2024-09-03 – 2024-09-09 (×18): 2.5 mg via RESPIRATORY_TRACT
  Filled 2024-09-03 (×19): qty 3

## 2024-09-03 MED ORDER — DEXTROSE-SODIUM CHLORIDE 5-0.45 % IV SOLN: INTRAVENOUS | Status: AC

## 2024-09-03 NOTE — Plan of Care (Signed)

## 2024-09-03 NOTE — Consult Note (Signed)
 Reason for Consult: Abdominal pain Referring Physician: Dr. Arthea Dickey Lynn Kline Lynn is an 68 y.o. female.  HPI: The patient is a 68 year old white female who presents with abdominal pain for the last 2 weeks.  The pain is located in her left lower quadrant.  She describes the pain is mild to moderate.  It seems to come and go at times.  She denies any fevers or chills at home.  She does report passing some air in the urine but no particulate matter.  A CT scan was consistent with a sigmoid diverticulitis and the radiologist raise the question of a possible colovesical fistula.  Past Medical History:  Diagnosis Date   Anxiety    Atherosclerosis    Colon polyp, hyperplastic    COPD (chronic obstructive pulmonary disease) (HCC)    Diverticulosis    Gallstone    Pneumonia     Past Surgical History:  Procedure Laterality Date   ABDOMINAL HYSTERECTOMY     BRONCHIAL BIOPSY  11/03/2020   Procedure: BRONCHIAL BIOPSIES;  Surgeon: Lynn Kline Adine LITTIE, DO;  Location: [facility];  Service: Pulmonary;;   BRONCHIAL BRUSHINGS  11/03/2020   Procedure: BRONCHIAL BRUSHINGS;  Surgeon: Lynn Kline Adine LITTIE, DO;  Location: [facility];  Service: Pulmonary;;   BRONCHIAL NEEDLE ASPIRATION BIOPSY  11/03/2020   Procedure: BRONCHIAL NEEDLE ASPIRATION BIOPSIES;  Surgeon: Lynn Kline Adine LITTIE, DO;  Location: [facility];  Service: Pulmonary;;   BRONCHIAL WASHINGS  11/03/2020   Procedure: BRONCHIAL WASHINGS;  Surgeon: Lynn Kline Adine LITTIE, DO;  Location: [facility];  Service: Pulmonary;;   CESAREAN SECTION     CHOLECYSTECTOMY     COLONOSCOPY     FIDUCIAL MARKER PLACEMENT  11/03/2020   Procedure: FIDUCIAL MARKER PLACEMENT;  Surgeon: Lynn Kline Adine LITTIE, DO;  Location: [facility];  Service: Pulmonary;;   VIDEO BRONCHOSCOPY WITH ENDOBRONCHIAL NAVIGATION N/A 11/03/2020   Procedure: VIDEO BRONCHOSCOPY WITH ENDOBRONCHIAL NAVIGATION AND POSSIBLE ULTRASOUND;  Surgeon: Lynn Kline Adine LITTIE, DO;  Location: [facility];  Service:  Pulmonary;  Laterality: N/A;   VIDEO BRONCHOSCOPY WITH ENDOBRONCHIAL ULTRASOUND  11/03/2020   Procedure: VIDEO BRONCHOSCOPY WITH ENDOBRONCHIAL ULTRASOUND;  Surgeon: Lynn Kline Adine LITTIE, DO;  Location: [facility];  Service: Pulmonary;;    Family History  Problem Relation Age of Onset   Arthritis Mother    COPD Mother    Emphysema Mother    Stroke Father    Heart disease Father    Cancer Father        MELANOMA   Breast cancer Maternal Grandmother    Colon cancer Neg Hx     Social History:  reports that she quit smoking about 2 years ago. Her smoking use included cigarettes. She has been exposed to tobacco smoke. She has never used smokeless tobacco. She reports that she does not drink alcohol and does not use drugs.  Allergies:  Allergies  Allergen Reactions   Codeine Nausea And Vomiting   Erythromycin Nausea And Vomiting   Ampicillin Nausea And Vomiting, Rash and Other (See Comments)    Pt states that rash was on her tongue.  Has patient had a PCN reaction causing immediate rash, facial/tongue/throat swelling, SOB or lightheadedness with hypotension yes Has patient had a PCN reaction causing severe rash involving mucus membranes or skin necrosis: no Has patient had a PCN reaction that required hospitalization yes Has patient had a PCN reaction occurring within the last 10 years: no If all of the above answers are NO, then may proceed with Cephalosporin use.  Medications: I have reviewed the patient's current medications.  Results for orders placed or performed during the hospital encounter of 09/02/24 (from the past 48 hours)  Urinalysis, w/ Reflex to Culture (Infection Suspected) -Urine, Clean Catch     Status: Abnormal   Collection Time: 09/02/24 11:03 AM  Result Value Ref Range   Specimen Source URINE, CLEAN CATCH    Color, Urine YELLOW YELLOW   APPearance CLOUDY (A) CLEAR   Specific Gravity, Urine 1.017 1.005 - 1.030   pH 5.0 5.0 - 8.0   Glucose, UA NEGATIVE  NEGATIVE mg/dL   Hgb urine dipstick SMALL (A) NEGATIVE   Bilirubin Urine NEGATIVE NEGATIVE   Ketones, ur 5 (A) NEGATIVE mg/dL   Protein, ur 899 (A) NEGATIVE mg/dL   Nitrite NEGATIVE NEGATIVE   Leukocytes,Ua LARGE (A) NEGATIVE   RBC / HPF 11-20 0 - 5 RBC/hpf   WBC, UA >50 0 - 5 WBC/hpf    Comment:        Reflex urine culture not performed if WBC <=10, OR if Squamous epithelial cells >5. If Squamous epithelial cells >5 suggest recollection.    Bacteria, UA FEW (A) NONE SEEN   Squamous Epithelial / HPF 0-5 0 - 5 /HPF   WBC Clumps PRESENT    Mucus PRESENT     Comment: Performed at [facility], [address] [place], [place] [address]  Basic metabolic panel     Status: Abnormal   Collection Time: 09/02/24 11:31 AM  Result Value Ref Range   Sodium 137 135 - 145 mmol/L   Potassium 4.0 3.5 - 5.1 mmol/L   Chloride 96 (L) 98 - 111 mmol/L   CO2 32 22 - 32 mmol/L   Glucose, Bld 85 70 - 99 mg/dL    Comment: Glucose reference range applies only to samples taken after fasting for at least 8 hours.   BUN 11 8 - 23 mg/dL   Creatinine, Ser 9.42 0.44 - 1.00 mg/dL   Calcium  9.6 8.9 - 10.3 mg/dL   GFR, Estimated >39 >39 mL/min    Comment: (NOTE) Calculated using the CKD-EPI Creatinine Equation (2021)    Anion gap 9 5 - 15    Comment: Performed at [facility], [address], [place], [place] [address]  CBC     Status: Abnormal   Collection Time: 09/02/24 11:31 AM  Result Value Ref Range   WBC 11.4 (H) 4.0 - 10.5 K/uL   RBC 4.13 3.87 - 5.11 MIL/uL   Hemoglobin 12.0 12.0 - 15.0 g/dL   HCT 62.3 63.9 - 53.9 %   MCV 91.0 80.0 - 100.0 fL   MCH 29.1 26.0 - 34.0 pg   MCHC 31.9 30.0 - 36.0 g/dL   RDW 86.7 88.4 - 84.4 %   Platelets 568 (H) 150 - 400 K/uL   nRBC 0.0 0.0 - 0.2 %    Comment: Performed at [facility], [address] [place], [place] [address]    CT Renal Stone Study Result Date: 09/02/2024 EXAM: CT ABDOMEN AND PELVIS  WITHOUT CONTRAST 09/02/2024 04:11:31 PM TECHNIQUE: CT of the abdomen and pelvis was performed without the administration of intravenous contrast. Multiplanar reformatted images are provided for review. Automated exposure control, iterative reconstruction, and/or weight-based adjustment of the mA/kV was utilized to reduce the radiation dose to as low as reasonably achievable. COMPARISON: CT dated 08/07/2014. CLINICAL HISTORY: Abdominal/flank pain, stone suspected; left sided abd pain. FINDINGS: LOWER CHEST: Normal heart size. Clear lung  bases. LIVER: Normal, without mass or intrahepatic biliary duct dilatation. GALLBLADDER AND BILE DUCTS: Cholecystectomy. No biliary ductal dilatation. SPLEEN: Normal in size and morphology. PANCREAS: Normal, without duct dilatation or acute inflammation. ADRENAL GLANDS: No acute abnormality. KIDNEYS, URETERS AND BLADDER: No renal mass or hydronephrosis. No hydroureter or ureteric stone. No renal calculi. Mild bladder wall thickening and moderate pericystic edema including on image 64 of series 2 .subtle gas in the nondependent bladder. GI AND BOWEL: Normal stomach, without wall thickening. Normal small bowel caliber. Scattered colonic diverticula. Wall thickening involving the sigmoid is moderate. Mild edema surrounds the proximal sigmoid including on 54 of series 2. Extraluminal soft tissue density and gas tract from the inflamed sigmoid colon to the urinary bladder, including on images 54 through 58 of series 2. Soft tissue in this region on image 54 may represent the left ovary. Normal appendix. PERITONEUM AND RETROPERITONEUM: No ascites. No free air. VASCULATURE: Aorta is normal in caliber. Aortic atherosclerosis. LYMPH NODES: No lymphadenopathy. REPRODUCTIVE ORGANS: Hysterectomy. BONES AND SOFT TISSUES: Mild osteopenia. Multiple lumbar and lumbosacral disc bulges. No acute osseous abnormality. No focal soft tissue abnormality. IMPRESSION: 1. Findings suspicious for sigmoid  diverticulitis, complicated by fistulous communication to the bladder with secondary cystitis. Extraluminal gas and soft tissue traversing the left hemipelvis, possibly involving the left ovary. This is suboptimally evaluated secondary to stone study technique. Consider dedicated postcontrast abdominal pelvic CT. 2. No urinary tract calculi or hydronephrosis. 3. Aortic atherosclerosis (ICD10-I70.0). Electronically signed by: Rockey Kilts MD 09/02/2024 04:51 PM EST RP Workstation: [id number]    Review of Systems  Constitutional: Negative.   HENT: Negative.    Eyes: Negative.   Respiratory: Negative.    Cardiovascular: Negative.   Gastrointestinal:  Positive for abdominal pain. Negative for nausea and vomiting.  Endocrine: Negative.   Genitourinary:  Positive for difficulty urinating and urgency.  Musculoskeletal: Negative.   Skin: Negative.   Allergic/Immunologic: Negative.   Neurological: Negative.   Hematological: Negative.   Psychiatric/Behavioral: Negative.     Blood pressure 116/70, pulse 82, temperature 97.8 F (36.6 C), temperature source Oral, resp. rate 16, SpO2 97%. Physical Exam Vitals reviewed.  Constitutional:      General: She is not in acute distress.    Appearance: Normal appearance.  HENT:     Head: Normocephalic and atraumatic.     Right Ear: External ear normal.     Left Ear: External ear normal.     Nose: Nose normal.     Mouth/Throat:     Mouth: Mucous membranes are moist.     Pharynx: Oropharynx is clear.  Eyes:     Extraocular Movements: Extraocular movements intact.     Conjunctiva/sclera: Conjunctivae normal.     Pupils: Pupils are equal, round, and reactive to light.  Cardiovascular:     Rate and Rhythm: Normal rate and regular rhythm.     Pulses: Normal pulses.     Heart sounds: Normal heart sounds.  Pulmonary:     Effort: Pulmonary effort is normal. No respiratory distress.     Breath sounds: Normal breath sounds.  Abdominal:     General:  Abdomen is flat.     Palpations: Abdomen is soft.     Comments: There is mild left lower quadrant focal tenderness  Musculoskeletal:        General: No swelling or deformity. Normal range of motion.     Cervical back: Normal range of motion and neck supple.  Skin:    General: Skin is warm  and dry.     Coloration: Skin is not jaundiced.  Neurological:     General: No focal deficit present.     Mental Status: She is alert and oriented to person, place, and time.  Psychiatric:        Mood and Affect: Mood normal.        Behavior: Behavior normal.     Assessment/Plan: The patient appears to have sigmoid diverticulitis.  Her pain is very focal in the left lower quadrant.  At this point I would recommend bowel rest and starting her on broad-spectrum antibiotic therapy and IV hydration.  She will need to be evaluated by gastroenterology and urology for a suspected colovesical fistula.  She does not have an indication for urgent surgery tonight.  The surgery team will follow her closely with you.  Deward Null III 09/03/2024, 1:52 AM

## 2024-09-03 NOTE — Plan of Care (Signed)
   Problem: Education: Goal: Knowledge of General Education information will improve Description: Including pain rating scale, medication(s)/side effects and non-pharmacologic comfort measures Outcome: Progressing   Problem: Clinical Measurements: Goal: Ability to maintain clinical measurements within normal limits will improve Outcome: Progressing

## 2024-09-03 NOTE — Progress Notes (Signed)
  Progress Note   Patient: Lynn Kline FMW:[id number] DOB: 12-25-1955 DOA: 09/02/2024     1 DOS: the patient was seen and examined on 09/03/2024   Brief hospital course: The patient is a 68 yr old woman who carries a  medical history significant for anxiety, COPD, chronic hypoxic respiratory failure requiring 3L O2 at baseline, diverticulosis, and colon polyps. She presented to the [facility] ED on 09/02/2024 with complaints of urinary incontinence and left-sided abdominal pain which has worsened over the past 2 weeks. UA performed in the ED was positive for a UTI. Urine culture has grown out multiple species. Will repeat. WBC's were elevated at 11.4.  CT abdomen and pelvis demonstrated sigmoid diverticulitis complicated by a fistulous communication to the bladder with secondary cystitis. There was also extraluminous gas and soft tissue traversing the left hemipelvis possibly involving the left ovary. Gastroenterology and urology have been consulted. The patient is receiving IV ceftriaxone and IV flagyl.  Her pain is improved.   Assessment and Plan:  Sigmoid diverticulitis/ fistula with bladder/ possible free air General Surgery consulted. They have recommended gastroenterology consult.  Urology consulted - Continue Rocephin and Flagyl started in the ED - N.p.o. - IV fluids - Supportive care.  She is not requiring pain medication at this time   Emphysema without exacerbation She is at her baseline mild daily wheeze. Continue Breztri  and albuterol  nebulizer treatments   Anxiety  She is on Xanax  bid prn, but she takes it everyday   HLD holding Crestor  Hypoglycemia Due to NPO status. Patient has been started on D5 1/2 NS  I have seen and examined this patient myself. I have spent 38 minutes in her evaluation and care.  Subjective: The patient is resting comfortably. No new complaints.  Physical Exam: Vitals:   09/03/24 0521 09/03/24 0823 09/03/24 0919 09/03/24 1524  BP: 104/60   102/61 116/68  Pulse: 92  93 92  Resp: 20  16 15   Temp: 98 F (36.7 C)  97.9 F (36.6 C) 98.1 F (36.7 C)  TempSrc: Oral  Oral Oral  SpO2: 92% 95% 97% 95%   Exam:  Constitutional:  The patient is awake, alert, and oriented x 3. No acute distress. Eyes:  pupils and irises appear normal Normal lids and conjunctivae ENMT:  grossly normal hearing  Lips appear normal external ears, nose appear normal Oropharynx: mucosa, tongue,posterior pharynx appear normal Neck:  neck appears normal, no masses, normal ROM, supple no thyromegaly Respiratory:  No increased work of breathing. No wheezes, rales, or rhonchi No tactile fremitus Cardiovascular:  Regular rate and rhythm No murmurs, ectopy, or gallups. No lateral PMI. No thrills. Abdomen:  Abdomen is soft, non-tender, non-distended No hernias, masses, or organomegaly Normoactive bowel sounds.  Musculoskeletal:  No cyanosis, clubbing, or edema Skin:  No rashes, lesions, ulcers palpation of skin: no induration or nodules Neurologic:  CN 2-12 intact Sensation all 4 extremities intact Psychiatric:  Mental status Mood, affect appropriate Orientation to person, place, time  judgment and insight appear intact  Data Reviewed:  CBC, BMP  Family Communication: None available  Disposition: Status is: Inpatient Remains inpatient appropriate because: Need for subspecialty care and surgical intervention.  Planned Discharge Destination: tbd    Time spent: 38 minutes  Author: Ahria Slappey, DO 09/03/2024 6:41 PM  For on call review [email or web address].

## 2024-09-03 NOTE — Consult Note (Addendum)
 Referring Provider: Dr. Thom Fetters Primary Care Physician:  Joshua Debby CROME, MD Primary Gastroenterologist: [facility], previously Dr. Obie  Reason for Consultation: Diverticulitis, possible colovesical fistula   HPI: Lynn Kline is a 68 y.o. female with past medical history of anxiety, COPD on home oxygen  3 L nasal cannula, diverticulosis and colon polyps.  She presented to the ED 09/02/2024 with urinary incontinence and left-sided abdominal pain which progressively worsened over the past 2 weeks.  Labs in the ED showed a WBC count of 11.4.  Hemoglobin 12.0.  Hematocrit 37.6.  Platelets 568.  Sodium 137.  Potassium 4.0.  BUN 11.  Creatinine 0.57.  Urinalysis showed large amount of leukocytes, small amount of hemoglobin and protein 100.  CTAP without contrast identified evidence of sigmoid diverticulitis complicated by fistulous communication to the bladder with secondary cystitis.  Started on IV antibiotics.  He was evaluated by general surgeon Dr. Deward Null, no indication for urgent surgery this time.  GI consult was requested for further evaluation regarding diverticulitis and suspected colovesicular fistula.  She endorsed having urinary incontinence, constipation and intermittent LLQ pain which progressively worsened over the past 2 weeks.  She described feeling as if her bladder prolapsed.  She denied having pneumaturia.  No bloody or black stools.  She had 1 episode of diarrhea x 1 yesterday without further recurrence.  She underwent a colonoscopy 02/2012 which identified multiple hyperplastic polyps removed from the rectosigmoid colon and mild diverticulosis in the sigmoid colon.  She denies having any further colonoscopies since then.  No known family history of IBD or colorectal cancer.  She takes Aleve 1 tab 7 to 8 days monthly for headaches.  She has a history of COPD, quit smoking cigarettes 2 years ago and requires home oxygen  3 L nasal cannula.  GI PROCEDURES:  EGD  08/13/2014: Grade a esophagitis Benign-appearing nonobstructing esophageal stricture  Dilated to 16 mm 4. 2-3 cm hiatal hernia, easily reducible   Biopsies from gastric antrum to rule out H. pylori   Colonoscopy 03/16/2012: Multiple polyps, rectosigmoid Mild diverticulosis in the sigmoid colon Otherwise normal examination Surgical [P], 20 and 17 cm, polyp (5) - HYPERPLASTIC POLYP(S) (MULTIPLE FRAGMENTS). NO ADENOMATOUS CHANGE OR MALIGNANCY IDENTIFIED.  Past Medical History:  Diagnosis Date   Anxiety    Atherosclerosis    Colon polyp, hyperplastic    COPD (chronic obstructive pulmonary disease) (HCC)    Diverticulosis    Gallstone    Pneumonia     Past Surgical History:  Procedure Laterality Date   ABDOMINAL HYSTERECTOMY     BRONCHIAL BIOPSY  11/03/2020   Procedure: BRONCHIAL BIOPSIES;  Surgeon: Brenna Adine CROME, DO;  Location: [facility];  Service: Pulmonary;;   BRONCHIAL BRUSHINGS  11/03/2020   Procedure: BRONCHIAL BRUSHINGS;  Surgeon: Brenna Adine CROME, DO;  Location: [facility];  Service: Pulmonary;;   BRONCHIAL NEEDLE ASPIRATION BIOPSY  11/03/2020   Procedure: BRONCHIAL NEEDLE ASPIRATION BIOPSIES;  Surgeon: Brenna Adine CROME, DO;  Location: [facility];  Service: Pulmonary;;   BRONCHIAL WASHINGS  11/03/2020   Procedure: BRONCHIAL WASHINGS;  Surgeon: Brenna Adine CROME, DO;  Location: [facility];  Service: Pulmonary;;   CESAREAN SECTION     CHOLECYSTECTOMY     COLONOSCOPY     FIDUCIAL MARKER PLACEMENT  11/03/2020   Procedure: FIDUCIAL MARKER PLACEMENT;  Surgeon: Brenna Adine CROME, DO;  Location: [facility];  Service: Pulmonary;;   VIDEO BRONCHOSCOPY WITH ENDOBRONCHIAL NAVIGATION N/A 11/03/2020   Procedure: VIDEO BRONCHOSCOPY WITH ENDOBRONCHIAL NAVIGATION AND POSSIBLE  ULTRASOUND;  Surgeon: Brenna Adine CROME, DO;  Location: [facility];  Service: Pulmonary;  Laterality: N/A;   VIDEO BRONCHOSCOPY WITH ENDOBRONCHIAL ULTRASOUND  11/03/2020   Procedure: VIDEO BRONCHOSCOPY WITH  ENDOBRONCHIAL ULTRASOUND;  Surgeon: Brenna Adine CROME, DO;  Location: [facility];  Service: Pulmonary;;    Prior to Admission medications   Medication Sig Start Date End Date Taking? Authorizing Provider  albuterol  (PROVENTIL ) (2.5 MG/3ML) 0.083% nebulizer solution USE 3 ML VIA NEBULIZER EVERY 4 HOURS AS NEEDED FOR WHEEZING OR SHORTNESS OF BREATH Patient taking differently: Take 2.5 mg by nebulization every 4 (four) hours as needed for wheezing or shortness of breath. 05/27/24  Yes Joshua Debby CROME, MD  ALPRAZolam  (XANAX ) 0.25 MG tablet TAKE 1 TABLET(0.25 MG) BY MOUTH TWICE DAILY AS NEEDED Patient taking differently: Take 0.25 mg by mouth See admin instructions.  Take 0.25 mg by mouth in the morning and an additional 0.25 mg once a day as needed for anxiety 07/08/24  Yes Joshua Debby CROME, MD  budesonide -glycopyrrolate-formoterol (BREZTRI  AEROSPHERE) 160-9-4.8 MCG/ACT AERO inhaler Inhale 2 puffs into the lungs in the morning and at bedtime. 04/12/24  Yes Cobb, Comer GAILS, NP  cholecalciferol (VITAMIN D3) 25 MCG (1000 UNIT) tablet Take 1,000 Units by mouth daily.   Yes [provider]  cyanocobalamin  2000 MCG tablet Take 1 tablet (2,000 mcg total) by mouth daily. 07/18/24  Yes Joshua Debby CROME, MD  naproxen sodium (ALEVE) 220 MG tablet Take 220 mg by mouth 2 (two) times daily as needed (for mild pain or headaches).   Yes [provider]  OXYGEN  Inhale 3 L/min into the lungs continuous.   Yes [provider]  rosuvastatin (CRESTOR) 10 MG tablet Take 1 tablet (10 mg total) by mouth daily. Patient taking differently: Take 10 mg by mouth at bedtime. 07/17/24  Yes Joshua Debby CROME, MD  VENTOLIN  HFA 108 (90 Base) MCG/ACT inhaler INHALE 1 TO 2 PUFFS INTO THE LUNGS EVERY 6 HOURS AS NEEDED FOR WHEEZING OR SHORTNESS OF BREATH 07/05/24  Yes Joshua Debby CROME, MD    Current Facility-Administered Medications  Medication Dose Route Frequency Provider Last Rate Last Admin   0.9 %  sodium  chloride infusion   Intravenous Continuous Arthea Child, MD 75 mL/hr at 09/02/24 2212 New Bag at 09/02/24 2212   acetaminophen  (TYLENOL ) tablet 650 mg  650 mg Oral Q6H PRN Arthea Child, MD       Or   acetaminophen  (TYLENOL ) suppository 650 mg  650 mg Rectal Q6H PRN Arthea Child, MD       albuterol  (PROVENTIL ) (2.5 MG/3ML) 0.083% nebulizer solution 2.5 mg  2.5 mg Nebulization Q2H PRN Arthea Child, MD       albuterol  (PROVENTIL ) (2.5 MG/3ML) 0.083% nebulizer solution 2.5 mg  2.5 mg Nebulization TID Swayze, Ava, DO       ALPRAZolam  (XANAX ) tablet 0.25 mg  0.25 mg Oral BID PRN Claiborne, Claudia, MD       budesonide -glycopyrrolate-formoterol (BREZTRI ) 160-9-4.8 MCG/ACT inhaler 2 puff  2 puff Inhalation BID AC & HS Arthea Child, MD   2 puff at 09/03/24 0823   cefTRIAXone (ROCEPHIN) 2 g in sodium chloride  0.9 % 100 mL IVPB  2 g Intravenous Q24H Claiborne, Claudia, MD       metroNIDAZOLE (FLAGYL) IVPB 500 mg  500 mg Intravenous Q12H Claiborne, Claudia, MD 100 mL/hr at 09/03/24 0809 500 mg at 09/03/24 0809   ondansetron  (ZOFRAN ) tablet 4 mg  4 mg Oral Q6H PRN Arthea Child, MD  Or   ondansetron  (ZOFRAN ) injection 4 mg  4 mg Intravenous Q6H PRN Arthea Child, MD       sodium chloride  flush (NS) 0.9 % injection 3 mL  3 mL Intravenous Q12H Arthea Child, MD   3 mL at 09/02/24 2221    Allergies as of 09/02/2024 - Review Complete 09/02/2024  Allergen Reaction Noted   Codeine Nausea And Vomiting 10/23/2011   Erythromycin Nausea And Vomiting 08/07/2014   Ampicillin Nausea And Vomiting, Rash, and Other (See Comments) 10/23/2011    Family History  Problem Relation Age of Onset   Arthritis Mother    COPD Mother    Emphysema Mother    Stroke Father    Heart disease Father    Cancer Father        MELANOMA   Breast cancer Maternal Grandmother    Colon cancer Neg Hx     Social History   Socioeconomic History   Marital status: Divorced    Spouse  name: Not on file   Number of children: Not on file   Years of education: Not on file   Highest education level: 12th grade  Occupational History   Not on file  Tobacco Use   Smoking status: Former    Current packs/day: 0.00    Types: Cigarettes    Quit date: 12/15/2021    Years since quitting: 2.7    Passive exposure: Past   Smokeless tobacco: Never   Tobacco comments:    Pt states she quit smoking in March 2023. 07/21/2022 Tay  Substance and Sexual Activity   Alcohol use: No    Alcohol/week: 0.0 standard drinks of alcohol   Drug use: No   Sexual activity: Never    Comment: 1ST INTERCOURSE- 16, PARTNERS- 4  Other Topics Concern   Not on file  Social History Narrative   Divorced   Social Drivers of Health   Financial Resource Strain: Medium Risk (04/09/2024)   Overall Financial Resource Strain (CARDIA)    Difficulty of Paying Living Expenses: Somewhat hard  Food Insecurity: Food Insecurity Present (09/02/2024)   Hunger Vital Sign    Worried About Running Out of Food in the Last Year: Never true    Ran Out of Food in the Last Year: Sometimes true  Transportation Needs: No Transportation Needs (09/02/2024)   PRAPARE - Administrator, Civil Service (Medical): No    Lack of Transportation (Non-Medical): No  Physical Activity: Insufficiently Active (04/09/2024)   Exercise Vital Sign    Days of Exercise per Week: 7 days    Minutes of Exercise per Session: 10 min  Stress: Stress Concern Present (04/09/2024)   [employer] of Occupational Health - Occupational Stress Questionnaire    Feeling of Stress: Very much  Social Connections: Socially Isolated (09/02/2024)   Social Connection and Isolation Panel    Frequency of Communication with Friends and Family: More than three times a week    Frequency of Social Gatherings with Friends and Family: Once a week    Attends Religious Services: Never    Database Administrator or Organizations: No    Attends Tax Inspector Meetings: Never    Marital Status: Divorced  Catering Manager Violence: Not At Risk (09/02/2024)   Humiliation, Afraid, Rape, and Kick questionnaire    Fear of Current or Ex-Partner: No    Emotionally Abused: No    Physically Abused: No    Sexually Abused: No   Review of Systems: Gen: Denies  fever, sweats or chills. No weight loss.  CV: Denies chest pain, palpitations or edema. Resp: Denies cough, shortness of breath of hemoptysis.  GI: See HPI.  No GERD symptoms.   GU : No GERD symptoms.  MS: Denies joint pain, muscles aches or weakness. Derm: Denies rash, itchiness, skin lesions or unhealing ulcers. Psych: Denies depression, anxiety, memory loss or confusion. Heme: Denies easy bruising, bleeding. Neuro: + Headaches.  Endo:  Denies any problems with DM, thyroid  or adrenal function.  Physical Exam: Vital signs in last 24 hours: Temp:  [97.8 F (36.6 C)-98.3 F (36.8 C)] 98 F (36.7 C) (11/18 0521) Pulse Rate:  [82-101] 92 (11/18 0521) Resp:  [16-20] 20 (11/18 0521) BP: (104-145)/(60-85) 104/60 (11/18 0521) SpO2:  [92 %-100 %] 95 % (11/18 0823) Last BM Date : 08/31/24 General: Alert 68 year old female in no acute distress. Head:  Normocephalic and atraumatic. Eyes:  No scleral icterus. Conjunctiva pink. Ears:  Normal auditory acuity. Nose:  No deformity, discharge or lesions. Mouth:  Upper dentures. No ulcers or lesions.  Neck:  Supple. No lymphadenopathy or thyromegaly.  Lungs: Breath sounds clear throughout. No wheezes, rhonchi or crackles.  Heart: Regular rate and rhythm, no murmurs. Abdomen: Soft, nondistended.  Tenderness to the LLQ without rebound or guarding.  Positive bowel sounds to all 4 quadrants. Rectal: Deferred. Musculoskeletal:  Symmetrical without gross deformities.  Pulses:  Normal pulses noted. Extremities:  Without clubbing or edema. Neurologic:  Alert and  oriented x 4. No focal deficits.  Skin:  Intact without significant lesions or  rashes. Psych:  Alert and cooperative. Normal mood and affect.  Intake/Output from previous day: 11/17 0701 - 11/18 0700 In: -  Out: 200 [Urine:200] Intake/Output this shift: No intake/output data recorded.  Lab Results: Recent Labs    09/02/24 1131 09/03/24 0444  WBC 11.4* 9.9  HGB 12.0 10.8*  HCT 37.6 34.4*  PLT 568* 530*   BMET Recent Labs    09/02/24 1131  NA 137  K 4.0  CL 96*  CO2 32  GLUCOSE 85  BUN 11  CREATININE 0.57  CALCIUM  9.6   LFT No results for input(s): PROT, ALBUMIN, AST, ALT, ALKPHOS, BILITOT, BILIDIR, IBILI in the last 72 hours. PT/INR No results for input(s): LABPROT, INR in the last 72 hours. Hepatitis Panel No results for input(s): HEPBSAG, HCVAB, HEPAIGM, HEPBIGM in the last 72 hours.    Studies/Results: CT Renal Stone Study Result Date: 09/02/2024 EXAM: CT ABDOMEN AND PELVIS WITHOUT CONTRAST 09/02/2024 04:11:31 PM TECHNIQUE: CT of the abdomen and pelvis was performed without the administration of intravenous contrast. Multiplanar reformatted images are provided for review. Automated exposure control, iterative reconstruction, and/or weight-based adjustment of the mA/kV was utilized to reduce the radiation dose to as low as reasonably achievable. COMPARISON: CT dated 08/07/2014. CLINICAL HISTORY: Abdominal/flank pain, stone suspected; left sided abd pain. FINDINGS: LOWER CHEST: Normal heart size. Clear lung bases. LIVER: Normal, without mass or intrahepatic biliary duct dilatation. GALLBLADDER AND BILE DUCTS: Cholecystectomy. No biliary ductal dilatation. SPLEEN: Normal in size and morphology. PANCREAS: Normal, without duct dilatation or acute inflammation. ADRENAL GLANDS: No acute abnormality. KIDNEYS, URETERS AND BLADDER: No renal mass or hydronephrosis. No hydroureter or ureteric stone. No renal calculi. Mild bladder wall thickening and moderate pericystic edema including on image 64 of series 2 .subtle gas in the  nondependent bladder. GI AND BOWEL: Normal stomach, without wall thickening. Normal small bowel caliber. Scattered colonic diverticula. Wall thickening involving the sigmoid is moderate. Mild edema surrounds  the proximal sigmoid including on 54 of series 2. Extraluminal soft tissue density and gas tract from the inflamed sigmoid colon to the urinary bladder, including on images 54 through 58 of series 2. Soft tissue in this region on image 54 may represent the left ovary. Normal appendix. PERITONEUM AND RETROPERITONEUM: No ascites. No free air. VASCULATURE: Aorta is normal in caliber. Aortic atherosclerosis. LYMPH NODES: No lymphadenopathy. REPRODUCTIVE ORGANS: Hysterectomy. BONES AND SOFT TISSUES: Mild osteopenia. Multiple lumbar and lumbosacral disc bulges. No acute osseous abnormality. No focal soft tissue abnormality. IMPRESSION: 1. Findings suspicious for sigmoid diverticulitis, complicated by fistulous communication to the bladder with secondary cystitis. Extraluminal gas and soft tissue traversing the left hemipelvis, possibly involving the left ovary. This is suboptimally evaluated secondary to stone study technique. Consider dedicated postcontrast abdominal pelvic CT. 2. No urinary tract calculi or hydronephrosis. 3. Aortic atherosclerosis (ICD10-I70.0). Electronically signed by: Rockey Kilts MD 09/02/2024 04:51 PM EST RP Workstation: [id number]    IMPRESSION/PLAN:  68 year old female with urinary incontinence, LLQ pain and constipation x 2 weeks. 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 Evaluated by general surgery, no indication for urgent surgery at this time.  On IV Ceftriaxone and Flagyl.  Afebrile.  Hemodynamically stable. - Clear liquid diet  - D5NS IV at 125 cch/hr  - Pain management per the hospitalist - Continue Flagyl 500 mg IV twice daily and Ceftriaxone 2 g IV  daily - Miralax Q HS PRN - Defer colonoscopy recommendations to Dr. Stacia - CBC and CMP in AM  Hypoglycemia. Capillary glucose level 37 at 9:27 AM.  Nurse at the bedside, patient drank 8 oz of juice.  - Management per the hospitalist  COPD, on home oxygen  3 L nasal cannula  History of hyperplastic polyps per colonoscopy 02/2012    Lynn Kline  09/03/2024, 10:04 AM  -------------------------------------------------------------------------------------------------------  I have taken a history, reviewed the chart and examined the patient. I performed a substantive portion of this encounter, including complete performance of at least one of the key components, in conjunction with the APP. I agree with the APP's note, impression and recommendations  68 year old female with history of chronic tobacco use, COPD on home oxygen  who presented with 2 weeks of left-sided lower quadrant discomfort and urinary incontinence.  CT showed evidence of sigmoid diverticulitis with suspected colovesicular fistula. Surgery has evaluated her and there is no need for any urgent surgical intervention.  Patient's last colonoscopy was 12 years ago, and was notable for left-sided diverticulosis, otherwise normal.  I spent a great deal of time with the patient and her son today reviewing the pathophysiology of diverticular disease, the need for colonoscopy to exclude neoplasm, the role of diet and smoking and diverticular disease.  We discussed the risk of recurrent urinary tract infections given the colovesicular fistula, but the lack of utility in chronic antibiotics in the absence of urinary symptoms.    Patient warrants a colonoscopy to exclude mass lesion masquerading as diverticulitis.  Will wait 4 weeks from presentation to allow diverticulitis to more fully resolve to reduce risk of perforation.  In the meantime, continue supportive care with empiric antibiotics.  Clear liquid diet today,  consider advancing to fulls tomorrow. Would tentatively recommend at least 2 weeks of antibiotics given complicated diverticulitis.  We will arrange outpatient colonoscopy in the next 4 to 6 weeks. We will follow-up with patient tomorrow.  Shyna Duignan E. Stacia, MD [facility]  I spent a total of 55 minutes reviewing the patient's medical record, interviewing and examining the patient, discussing her diagnosis and management of her condition going forward, and documenting in the medical record

## 2024-09-04 ENCOUNTER — Telehealth: Payer: Self-pay | Admitting: *Deleted

## 2024-09-04 ENCOUNTER — Inpatient Hospital Stay (HOSPITAL_COMMUNITY)

## 2024-09-04 DIAGNOSIS — K5732 Diverticulitis of large intestine without perforation or abscess without bleeding: Secondary | ICD-10-CM | POA: Diagnosis not present

## 2024-09-04 DIAGNOSIS — R52 Pain, unspecified: Secondary | ICD-10-CM | POA: Diagnosis not present

## 2024-09-04 DIAGNOSIS — M2041 Other hammer toe(s) (acquired), right foot: Secondary | ICD-10-CM | POA: Diagnosis not present

## 2024-09-04 DIAGNOSIS — J9611 Chronic respiratory failure with hypoxia: Secondary | ICD-10-CM

## 2024-09-04 DIAGNOSIS — Z9981 Dependence on supplemental oxygen: Secondary | ICD-10-CM

## 2024-09-04 DIAGNOSIS — K632 Fistula of intestine: Secondary | ICD-10-CM | POA: Diagnosis not present

## 2024-09-04 DIAGNOSIS — M79671 Pain in right foot: Secondary | ICD-10-CM | POA: Diagnosis not present

## 2024-09-04 DIAGNOSIS — M19071 Primary osteoarthritis, right ankle and foot: Secondary | ICD-10-CM | POA: Diagnosis not present

## 2024-09-04 DIAGNOSIS — M2011 Hallux valgus (acquired), right foot: Secondary | ICD-10-CM | POA: Diagnosis not present

## 2024-09-04 LAB — GLUCOSE, CAPILLARY
Glucose-Capillary: 102 mg/dL — ABNORMAL HIGH (ref 70–99)
Glucose-Capillary: 128 mg/dL — ABNORMAL HIGH (ref 70–99)
Glucose-Capillary: 141 mg/dL — ABNORMAL HIGH (ref 70–99)
Glucose-Capillary: 156 mg/dL — ABNORMAL HIGH (ref 70–99)
Glucose-Capillary: 192 mg/dL — ABNORMAL HIGH (ref 70–99)
Glucose-Capillary: 83 mg/dL (ref 70–99)
Glucose-Capillary: 97 mg/dL (ref 70–99)

## 2024-09-04 LAB — CBC
HCT: 33.3 % — ABNORMAL LOW (ref 36.0–46.0)
Hemoglobin: 10.4 g/dL — ABNORMAL LOW (ref 12.0–15.0)
MCH: 28.6 pg (ref 26.0–34.0)
MCHC: 31.2 g/dL (ref 30.0–36.0)
MCV: 91.5 fL (ref 80.0–100.0)
Platelets: 441 K/uL — ABNORMAL HIGH (ref 150–400)
RBC: 3.64 MIL/uL — ABNORMAL LOW (ref 3.87–5.11)
RDW: 13.2 % (ref 11.5–15.5)
WBC: 9.1 K/uL (ref 4.0–10.5)
nRBC: 0 % (ref 0.0–0.2)

## 2024-09-04 LAB — COMPREHENSIVE METABOLIC PANEL WITH GFR
ALT: 7 U/L (ref 0–44)
AST: 16 U/L (ref 15–41)
Albumin: 3.3 g/dL — ABNORMAL LOW (ref 3.5–5.0)
Alkaline Phosphatase: 89 U/L (ref 38–126)
Anion gap: 4 — ABNORMAL LOW (ref 5–15)
BUN: 6 mg/dL — ABNORMAL LOW (ref 8–23)
CO2: 36 mmol/L — ABNORMAL HIGH (ref 22–32)
Calcium: 8.6 mg/dL — ABNORMAL LOW (ref 8.9–10.3)
Chloride: 101 mmol/L (ref 98–111)
Creatinine, Ser: 0.54 mg/dL (ref 0.44–1.00)
GFR, Estimated: >60 mL/min (ref >60)
Glucose, Bld: 126 mg/dL — ABNORMAL HIGH (ref 70–99)
Potassium: 3.7 mmol/L (ref 3.5–5.1)
Sodium: 140 mmol/L (ref 135–145)
Total Bilirubin: 0.2 mg/dL (ref 0.0–1.2)
Total Protein: 5.9 g/dL — ABNORMAL LOW (ref 6.5–8.1)

## 2024-09-04 LAB — MISC LABCORP TEST (SEND OUT): Labcorp test code: 83935

## 2024-09-04 MED ORDER — ORAL CARE MOUTH RINSE: 15.0000 mL | OROMUCOSAL | Status: DC | PRN

## 2024-09-04 MED ORDER — GABAPENTIN 100 MG PO CAPS
200.0000 mg | ORAL_CAPSULE | Freq: Every day | ORAL | Status: DC
  Administered 2024-09-04 – 2024-09-15 (×12): 200 mg via ORAL
  Filled 2024-09-04 (×12): qty 2

## 2024-09-04 NOTE — Telephone Encounter (Signed)
-----   Message from Glendia FORBES Holt sent at 09/04/2024 12:45 PM EST ----- Regarding: Hospital colonoscopy in >4 weeks from now Team,  This patient is admitted with complicated diverticulitis.  She needs a colonoscopy sometime after 4 weeks to exclude mass lesion.  Will need to be in hospital due to oxygen -dependent COPD.  Thanks

## 2024-09-04 NOTE — Progress Notes (Signed)
 Mobility Specialist - Progress Note   09/04/24 0909  Mobility  Activity Ambulated with assistance  Level of Assistance Contact guard assist, steadying assist  Assistive Device Front wheel walker  Distance Ambulated (ft) 50 ft  Range of Motion/Exercises Active  Activity Response Tolerated well  Mobility Referral Yes  Mobility visit 1 Mobility  Mobility Specialist Start Time (ACUTE ONLY) 0909  Mobility Specialist Stop Time (ACUTE ONLY) [id number]  Mobility Specialist Time Calculation (min) (ACUTE ONLY) 29 min   Received in bed and agreed to mobility, c/o stiffness in BLE. Min A for bed mobility, and sit to stand. No issues throughout ambulation, short walk due to fatigue. Returned to chair with all needs met.  Cyndee Ada Mobility Specialist

## 2024-09-04 NOTE — Progress Notes (Addendum)
 [facility] Progress Note  CC:  Diverticulitis, possible colovesical fistula   Subjective: Feeling okay.  Still with some left lower quadrant abdominal discomfort.  Passing flatus, but no bowel movement yet.  Minimal nausea, no vomiting.  Tolerating clear liquid diet.  Seen while she was sitting up in the chair.  Objective:  Vital signs in last 24 hours: Temp:  [97.9 F (36.6 C)-98.5 F (36.9 C)] 98.5 F (36.9 C) (11/18 1959) Pulse Rate:  [92-93] 93 (11/18 1959) Resp:  [15-18] 18 (11/18 1959) BP: (102-127)/(61-69) 127/69 (11/18 1959) SpO2:  [95 %-97 %] 95 % (11/19 0816) Last BM Date : 08/31/24 General:  Alert, chonically ill-appearing in NAD Heart:  Regular rate and rhythm; no murmurs Pulm: Some mild wheezing noted bilaterally Abdomen:  Soft, non-distended.  BS present.  LLQ TTP. Neurologic:  Alert and oriented x 4;  grossly normal neurologically. Psych:  Alert and cooperative. Normal mood and affect.  Intake/Output from previous day: 11/18 0701 - 11/19 0700 In: 2900.8 [P.O.:620; I.V.:1980.8; IV Piggyback:300] Out: 2100 [Urine:2100]  Lab Results: Recent Labs    09/02/24 1131 09/03/24 0444 09/04/24 0434  WBC 11.4* 9.9 9.1  HGB 12.0 10.8* 10.4*  HCT 37.6 34.4* 33.3*  PLT 568* 530* 441*   BMET Recent Labs    09/02/24 1131 09/03/24 0444 09/04/24 0434  NA 137 139 140  K 4.0 3.8 3.7  CL 96* 99 101  CO2 32 28 36*  GLUCOSE 85 40* 126*  BUN 11 14 6*  CREATININE 0.57 0.51 0.54  CALCIUM  9.6 9.0 8.6*   LFT Recent Labs    09/04/24 0434  PROT 5.9*  ALBUMIN 3.3*  AST 16  ALT 7  ALKPHOS 89  BILITOT 0.2   CT Renal Stone Study Result Date: 09/02/2024 EXAM: CT ABDOMEN AND PELVIS WITHOUT CONTRAST 09/02/2024 04:11:31 PM TECHNIQUE: CT of the abdomen and pelvis was performed without the administration of intravenous contrast. Multiplanar reformatted images are provided for review. Automated exposure control, iterative reconstruction, and/or  weight-based adjustment of the mA/kV was utilized to reduce the radiation dose to as low as reasonably achievable. COMPARISON: CT dated 08/07/2014. CLINICAL HISTORY: Abdominal/flank pain, stone suspected; left sided abd pain. FINDINGS: LOWER CHEST: Normal heart size. Clear lung bases. LIVER: Normal, without mass or intrahepatic biliary duct dilatation. GALLBLADDER AND BILE DUCTS: Cholecystectomy. No biliary ductal dilatation. SPLEEN: Normal in size and morphology. PANCREAS: Normal, without duct dilatation or acute inflammation. ADRENAL GLANDS: No acute abnormality. KIDNEYS, URETERS AND BLADDER: No renal mass or hydronephrosis. No hydroureter or ureteric stone. No renal calculi. Mild bladder wall thickening and moderate pericystic edema including on image 64 of series 2 .subtle gas in the nondependent bladder. GI AND BOWEL: Normal stomach, without wall thickening. Normal small bowel caliber. Scattered colonic diverticula. Wall thickening involving the sigmoid is moderate. Mild edema surrounds the proximal sigmoid including on 54 of series 2. Extraluminal soft tissue density and gas tract from the inflamed sigmoid colon to the urinary bladder, including on images 54 through 58 of series 2. Soft tissue in this region on image 54 may represent the left ovary. Normal appendix. PERITONEUM AND RETROPERITONEUM: No ascites. No free air. VASCULATURE: Aorta is normal in caliber. Aortic atherosclerosis. LYMPH NODES: No lymphadenopathy. REPRODUCTIVE ORGANS: Hysterectomy. BONES AND SOFT TISSUES: Mild osteopenia. Multiple lumbar and lumbosacral disc bulges. No acute osseous abnormality. No focal soft tissue abnormality. IMPRESSION: 1. Findings suspicious for sigmoid diverticulitis, complicated by fistulous communication to the bladder with secondary cystitis. Extraluminal gas and  soft tissue traversing the left hemipelvis, possibly involving the left ovary. This is suboptimally evaluated secondary to stone study technique.  Consider dedicated postcontrast abdominal pelvic CT. 2. No urinary tract calculi or hydronephrosis. 3. Aortic atherosclerosis (ICD10-I70.0). Electronically signed by: Rockey Kilts MD 09/02/2024 04:51 PM EST RP Workstation: [id number]   Assessment / Plan: 68 year old female with urinary incontinence, LLQ pain and constipation x 2 weeks. 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 Evaluated by general surgery, no indication for urgent surgery at this time.  On IV Ceftriaxone and Flagyl.  Afebrile.  Hemodynamically stable. - Diet was advanced to full liquid diet  - Continue Flagyl 500 mg IV twice daily and Ceftriaxone 2 g IV daily.  Would do 2 week course of abx. - Colonoscopy as outpatient in 4-6 weeks.   Hypoglycemia - Management per the hospitalist   COPD, on home oxygen  3 L nasal cannula   History of hyperplastic polyps per colonoscopy 02/2012    LOS: 2 days   Lynn Kline. Zehr  09/04/2024, 8:44 AM   -----------------------------------------------------------------------------------------------------------------------------------  I have taken a history, reviewed the chart and examined the patient. I performed a substantive portion of this encounter, including complete performance of at least one of the key components, in conjunction with the APP. I agree with the APP's note, impression and recommendations.  Some modest clinical improvement over the last 24 hours.  Afebrile.  Left lower quadrant not as bad.  She did have an episode of diarrhea today.  Reporting a lot of leg pain and generalized weakness.  Tolerating clear liquid diet, looking forward to advancing to full liquids later today. Continue IV antibiotics Full liquid diet today Planning for outpatient colonoscopy in the hospital, date to be determined   Rainen Vanrossum E. Stacia, MD [facility]

## 2024-09-04 NOTE — Plan of Care (Signed)

## 2024-09-04 NOTE — Progress Notes (Signed)
 Progress Note   Patient: Lynn Kline FMW:[id number] DOB: 1956/08/21 DOA: 09/02/2024     2 DOS: the patient was seen and examined on 09/04/2024   Brief hospital course: The patient is a 68 yr old woman who carries a  medical history significant for anxiety, COPD, chronic hypoxic respiratory failure requiring 3L O2 at baseline, diverticulosis, and colon polyps. She presented to the [facility] ED on 09/02/2024 with complaints of urinary incontinence and left-sided abdominal pain which has worsened over the past 2 weeks. UA performed in the ED was positive for a UTI. Urine culture has grown out multiple species. Will repeat. WBC's were elevated at 11.4.  CT abdomen and pelvis demonstrated sigmoid diverticulitis complicated by a fistulous communication to the bladder with secondary cystitis. There was also extraluminous gas and soft tissue traversing the left hemipelvis possibly involving the left ovary. Gastroenterology and urology have been consulted. The patient is receiving IV ceftriaxone and IV flagyl.  Today the patient is complaining of pain in her legs and feet bilaterally. In particular she is concerned that she has a fracture in her right ankle.   Assessment and Plan:  Sigmoid diverticulitis/ fistula with bladder/ possible free air General Surgery consulted. They have recommended gastroenterology consult. There is no need for surgical intervention at this time. Gastroenterology has recommended a colonoscopy in 4-6 weeks.   Urology consulted - Continue Rocephin and Flagyl started in the ED - N.p.o. - IV fluids - Supportive care.  She is not requiring pain medication at this time   Emphysema without exacerbation She is at her baseline mild daily wheeze. Continue Breztri  and albuterol  nebulizer treatments   Anxiety  She is on Xanax  bid prn, but she takes it everyday   HLD holding Crestor  Hypoglycemia Due to NPO status. Patient has been started on D5 1/2 NS  I have seen and examined  this patient myself. I have spent 34 minutes in her evaluation and care.  Subjective: The patient is resting comfortably. No new complaints.  Physical Exam: Vitals:   09/03/24 1959 09/04/24 0816 09/04/24 1432 09/04/24 1500  BP: 127/69  (!) 110/59   Pulse: 93  100   Resp: 18  19   Temp: 98.5 F (36.9 C)  98.8 F (37.1 C)   TempSrc: Oral  Oral   SpO2: 96% 95% 97% 97%   Exam:  Constitutional:  The patient is awake, alert, and oriented x 3. No acute distress. Eyes:  pupils and irises appear normal Normal lids and conjunctivae ENMT:  grossly normal hearing  Lips appear normal external ears, nose appear normal Oropharynx: mucosa, tongue,posterior pharynx appear normal Neck:  neck appears normal, no masses, normal ROM, supple no thyromegaly Respiratory:  No increased work of breathing. No wheezes, rales, or rhonchi No tactile fremitus Cardiovascular:  Regular rate and rhythm No murmurs, ectopy, or gallups. No lateral PMI. No thrills. Abdomen:  Abdomen is soft, non-tender, non-distended No hernias, masses, or organomegaly Normoactive bowel sounds.  Musculoskeletal:  No cyanosis, clubbing, or edema Bilateral lower extremities are without erythema, wounds, or lesions. There are no apparent bony deformities.  Skin:  No rashes, lesions, ulcers palpation of skin: no induration or nodules Neurologic:  CN 2-12 intact Sensation all 4 extremities intact Psychiatric:  Mental status Mood, affect appropriate Orientation to person, place, time  judgment and insight appear intact  Data Reviewed:  CBC, BMP  Family Communication: None available  Disposition: Status is: Inpatient Remains inpatient appropriate because: Need for subspecialty care and surgical  intervention.  Planned Discharge Destination: tbd    Time spent: 32 minutes  Author: Eleyna Brugh, DO 09/04/2024 5:30 PM  For on call review [email or web address].

## 2024-09-04 NOTE — Progress Notes (Signed)
 Bilateral lower extremity venous duplex has been completed. Preliminary results can be found in CV Proc through chart review.   09/04/24 2:13 PM Cathlyn Collet RVT

## 2024-09-04 NOTE — Progress Notes (Signed)
 Progress Note     Subjective: Patient reports that her abdominal pain has improved since hospitalization. Tolerating CLD without nausea and vomiting. Having flatulence. Last BM was 11/17.   ROS  All negative with the exception of above.  Objective: Vital signs in last 24 hours: Temp:  [98.1 F (36.7 C)-98.5 F (36.9 C)] 98.5 F (36.9 C) (11/18 1959) Pulse Rate:  [92-93] 93 (11/18 1959) Resp:  [15-18] 18 (11/18 1959) BP: (116-127)/(68-69) 127/69 (11/18 1959) SpO2:  [95 %-97 %] 95 % (11/19 0816) Last BM Date : 08/31/24  Intake/Output from previous day: 11/18 0701 - 11/19 0700 In: 2900.8 [P.O.:620; I.V.:1980.8; IV Piggyback:300] Out: 2100 [Urine:2100] Intake/Output this shift: Total I/O In: -  Out: 525 [Urine:525]  PE: General: Pleasant female who is laying in bed in NAD. HEENT: Head is normocephalic, atraumatic.  Heart: HR normal during encounter.  Lungs: Respiratory effort nonlabored on [place]. Abd: Soft, ND. Tenderness to palpation of LLQ. No rebound tenderness or guarding. +BS. MS: Able to move all 4 extremities.  Skin: Warm and dry. Psych: A&Ox3 with an appropriate affect.  GU: Foley catheter in place draining yellow clear urine.    Lab Results:  Recent Labs    09/03/24 0444 09/04/24 0434  WBC 9.9 9.1  HGB 10.8* 10.4*  HCT 34.4* 33.3*  PLT 530* 441*   BMET Recent Labs    09/03/24 0444 09/04/24 0434  NA 139 140  K 3.8 3.7  CL 99 101  CO2 28 36*  GLUCOSE 40* 126*  BUN 14 6*  CREATININE 0.51 0.54  CALCIUM  9.0 8.6*   PT/INR No results for input(s): LABPROT, INR in the last 72 hours. CMP     Component Value Date/Time   NA 140 09/04/2024 0434   K 3.7 09/04/2024 0434   CL 101 09/04/2024 0434   CO2 36 (H) 09/04/2024 0434   GLUCOSE 126 (H) 09/04/2024 0434   BUN 6 (L) 09/04/2024 0434   CREATININE 0.54 09/04/2024 0434   CALCIUM  8.6 (L) 09/04/2024 0434   PROT 5.9 (L) 09/04/2024 0434   ALBUMIN 3.3 (L) 09/04/2024 0434   AST 16 09/04/2024 0434    ALT 7 09/04/2024 0434   ALKPHOS 89 09/04/2024 0434   BILITOT 0.2 09/04/2024 0434   GFRNONAA >60 09/04/2024 0434   GFRAA >60 11/22/2016 0832   Lipase     Component Value Date/Time   LIPASE 21 11/22/2016 0832       Studies/Results: CT Renal Stone Study Result Date: 09/02/2024 EXAM: CT ABDOMEN AND PELVIS WITHOUT CONTRAST 09/02/2024 04:11:31 PM TECHNIQUE: CT of the abdomen and pelvis was performed without the administration of intravenous contrast. Multiplanar reformatted images are provided for review. Automated exposure control, iterative reconstruction, and/or weight-based adjustment of the mA/kV was utilized to reduce the radiation dose to as low as reasonably achievable. COMPARISON: CT dated 08/07/2014. CLINICAL HISTORY: Abdominal/flank pain, stone suspected; left sided abd pain. FINDINGS: LOWER CHEST: Normal heart size. Clear lung bases. LIVER: Normal, without mass or intrahepatic biliary duct dilatation. GALLBLADDER AND BILE DUCTS: Cholecystectomy. No biliary ductal dilatation. SPLEEN: Normal in size and morphology. PANCREAS: Normal, without duct dilatation or acute inflammation. ADRENAL GLANDS: No acute abnormality. KIDNEYS, URETERS AND BLADDER: No renal mass or hydronephrosis. No hydroureter or ureteric stone. No renal calculi. Mild bladder wall thickening and moderate pericystic edema including on image 64 of series 2 .subtle gas in the nondependent bladder. GI AND BOWEL: Normal stomach, without wall thickening. Normal small bowel caliber. Scattered colonic diverticula. Wall thickening involving  the sigmoid is moderate. Mild edema surrounds the proximal sigmoid including on 54 of series 2. Extraluminal soft tissue density and gas tract from the inflamed sigmoid colon to the urinary bladder, including on images 54 through 58 of series 2. Soft tissue in this region on image 54 may represent the left ovary. Normal appendix. PERITONEUM AND RETROPERITONEUM: No ascites. No free air. VASCULATURE:  Aorta is normal in caliber. Aortic atherosclerosis. LYMPH NODES: No lymphadenopathy. REPRODUCTIVE ORGANS: Hysterectomy. BONES AND SOFT TISSUES: Mild osteopenia. Multiple lumbar and lumbosacral disc bulges. No acute osseous abnormality. No focal soft tissue abnormality. IMPRESSION: 1. Findings suspicious for sigmoid diverticulitis, complicated by fistulous communication to the bladder with secondary cystitis. Extraluminal gas and soft tissue traversing the left hemipelvis, possibly involving the left ovary. This is suboptimally evaluated secondary to stone study technique. Consider dedicated postcontrast abdominal pelvic CT. 2. No urinary tract calculi or hydronephrosis. 3. Aortic atherosclerosis (ICD10-I70.0). Electronically signed by: Rockey Kilts MD 09/02/2024 04:51 PM EST RP Workstation: [id number]    Anti-infectives: Anti-infectives (From admission, onward)    Start     Dose/Rate Route Frequency Ordered Stop   09/03/24 1600  cefTRIAXone (ROCEPHIN) 2 g in sodium chloride  0.9 % 100 mL IVPB        2 g 200 mL/hr over 30 Minutes Intravenous Every 24 hours 09/02/24 1944     09/03/24 0800  metroNIDAZOLE (FLAGYL) IVPB 500 mg        500 mg 100 mL/hr over 60 Minutes Intravenous Every 12 hours 09/02/24 1944     09/02/24 1715  metroNIDAZOLE (FLAGYL) IVPB 500 mg        500 mg 100 mL/hr over 60 Minutes Intravenous  Once 09/02/24 1710 09/02/24 1950   09/02/24 1530  cefTRIAXone (ROCEPHIN) 1 g in sodium chloride  0.9 % 100 mL IVPB        1 g 200 mL/hr over 30 Minutes Intravenous  Once 09/02/24 1529 09/02/24 1659        Assessment/Plan Sigmoid diverticulitis -CT from 11/17 showed findings suspicious for sigmoid diverticulitis, complicated by fistulous communication to the bladder with secondary cystitis. Extraluminal gas and soft tissue traversing the left hemipelvis, possibly involving the left ovary. This is suboptimally evaluated secondary to stone study technique. -Afebrile.  -WBC 9.1; HGB 10.4 from  10.8 -Tolerating CLD. Having flatulence. Abdominal pain has improved. -GI has provide consult 11/18. Planning for colonoscopy in 4 weeks. -No indication for emergent surgery. -Continue with IV antibiotics -Will continue to follow.   FEN: CLD but will advance to FLD; IVF per primary team VTE: SCDs. PI:[email or web address]    LOS: 2 days   I reviewed hospitalist notes, consulting provider notes, specialist notes, nursing notes, last 24 h vitals and pain scores, last 48 h intake and output, last 24 h labs and trends, and last 24 h imaging results.  This care required moderate level of medical decision making.    Marjorie Carlyon Favre, [facility] 09/04/2024, 10:11 AM Please see Amion for pager number during day hours 7:00am-4:30pm

## 2024-09-05 ENCOUNTER — Other Ambulatory Visit: Payer: Self-pay | Admitting: *Deleted

## 2024-09-05 DIAGNOSIS — K632 Fistula of intestine: Secondary | ICD-10-CM | POA: Diagnosis not present

## 2024-09-05 DIAGNOSIS — K5732 Diverticulitis of large intestine without perforation or abscess without bleeding: Secondary | ICD-10-CM

## 2024-09-05 LAB — COMPREHENSIVE METABOLIC PANEL WITH GFR
ALT: 6 U/L (ref 0–44)
AST: 11 U/L — ABNORMAL LOW (ref 15–41)
Albumin: 3.3 g/dL — ABNORMAL LOW (ref 3.5–5.0)
Alkaline Phosphatase: 85 U/L (ref 38–126)
Anion gap: 7 (ref 5–15)
BUN: <5 mg/dL — ABNORMAL LOW (ref 8–23)
CO2: 33 mmol/L — ABNORMAL HIGH (ref 22–32)
Calcium: 9 mg/dL (ref 8.9–10.3)
Chloride: 98 mmol/L (ref 98–111)
Creatinine, Ser: 0.55 mg/dL (ref 0.44–1.00)
GFR, Estimated: >60 mL/min (ref >60)
Glucose, Bld: 97 mg/dL (ref 70–99)
Potassium: 3.5 mmol/L (ref 3.5–5.1)
Sodium: 138 mmol/L (ref 135–145)
Total Bilirubin: 0.3 mg/dL (ref 0.0–1.2)
Total Protein: 5.9 g/dL — ABNORMAL LOW (ref 6.5–8.1)

## 2024-09-05 LAB — CBC WITH DIFFERENTIAL/PLATELET
Abs Immature Granulocytes: 0.03 K/uL (ref 0.00–0.07)
Basophils Absolute: 0.1 K/uL (ref 0.0–0.1)
Basophils Relative: 0 %
Eosinophils Absolute: 0.3 K/uL (ref 0.0–0.5)
Eosinophils Relative: 2 %
HCT: 33.4 % — ABNORMAL LOW (ref 36.0–46.0)
Hemoglobin: 10.6 g/dL — ABNORMAL LOW (ref 12.0–15.0)
Immature Granulocytes: 0 %
Lymphocytes Relative: 19 %
Lymphs Abs: 2.2 K/uL (ref 0.7–4.0)
MCH: 29 pg (ref 26.0–34.0)
MCHC: 31.7 g/dL (ref 30.0–36.0)
MCV: 91.5 fL (ref 80.0–100.0)
Monocytes Absolute: 1 K/uL (ref 0.1–1.0)
Monocytes Relative: 8 %
Neutro Abs: 8.3 K/uL — ABNORMAL HIGH (ref 1.7–7.7)
Neutrophils Relative %: 71 %
Platelets: 434 K/uL — ABNORMAL HIGH (ref 150–400)
RBC: 3.65 MIL/uL — ABNORMAL LOW (ref 3.87–5.11)
RDW: 13.5 % (ref 11.5–15.5)
WBC: 11.8 K/uL — ABNORMAL HIGH (ref 4.0–10.5)
nRBC: 0 % (ref 0.0–0.2)

## 2024-09-05 LAB — GLUCOSE, CAPILLARY
Glucose-Capillary: 100 mg/dL — ABNORMAL HIGH (ref 70–99)
Glucose-Capillary: 123 mg/dL — ABNORMAL HIGH (ref 70–99)
Glucose-Capillary: 129 mg/dL — ABNORMAL HIGH (ref 70–99)
Glucose-Capillary: 130 mg/dL — ABNORMAL HIGH (ref 70–99)
Glucose-Capillary: 130 mg/dL — ABNORMAL HIGH (ref 70–99)
Glucose-Capillary: 99 mg/dL (ref 70–99)

## 2024-09-05 NOTE — Progress Notes (Addendum)
 [facility] Progress Note  CC:   Diverticulitis, possible colovesical fistula     Subjective: She has tolerated a full liquid diet, plans on ordering a soft diet for lunch. She was walking in the hall with PT earlier this morning. She passed 2 dark brown mud like loose stools yesterday and x 1 this am. No bright red blood per the rectum. No abdominal pain but endorse having tenderness to the LLQ if she presses on that area. No CP or SOB.    Objective:  Vital signs in last 24 hours: Temp:  [98.5 F (36.9 C)-98.8 F (37.1 C)] 98.7 F (37.1 C) (11/20 0539) Pulse Rate:  [99-101] 99 (11/20 0539) Resp:  [15-19] 15 (11/20 0539) BP: (110-127)/(59-65) 127/60 (11/20 0539) SpO2:  [97 %-98 %] 98 % (11/20 0859) Last BM Date : 09/04/24 General: Alert, well-developed in NAD Heart: Regular rate rhythm, no murmurs. Pulm: Breath sounds clear with faint wheeze to the right upper lobe.  No rhonchi or crackles.  On oxygen  3 L nasal cannula. Abdomen: Soft, nondistended.  Mild tenderness to the LLQ without rebound or guarding.  Positive bowel sounds to all 4 quadrants. GU:  Indwelling Foley catheter intact, urine clear. Extremities: No lower extremity edema. Neurologic:  Alert and oriented x 4. Grossly normal neurologically. Psych:  Alert and cooperative. Normal mood and affect.  Intake/Output from previous day: 11/19 0701 - 11/20 0700 In: 3 [I.V.:3] Out: 2375 [Urine:2375] Intake/Output this shift: Total I/O In: 240 [P.O.:240] Out: -   Lab Results: Recent Labs    09/03/24 0444 09/04/24 0434 09/05/24 0503  WBC 9.9 9.1 11.8*  HGB 10.8* 10.4* 10.6*  HCT 34.4* 33.3* 33.4*  PLT 530* 441* 434*   BMET Recent Labs    09/03/24 0444 09/04/24 0434 09/05/24 0503  NA 139 140 138  K 3.8 3.7 3.5  CL 99 101 98  CO2 28 36* 33*  GLUCOSE 40* 126* 97  BUN 14 6* <5*  CREATININE 0.51 0.54 0.55  CALCIUM  9.0 8.6* 9.0   LFT Recent Labs    09/05/24 0503  PROT 5.9*  ALBUMIN  3.3*  AST 11*  ALT 6  ALKPHOS 85  BILITOT 0.3   PT/INR No results for input(s): LABPROT, INR in the last 72 hours. Hepatitis Panel No results for input(s): HEPBSAG, HCVAB, HEPAIGM, HEPBIGM in the last 72 hours.  VAS US  LOWER EXTREMITY VENOUS (DVT) Result Date: 09/04/2024  Lower Venous DVT Study Patient Name:  Lynn Kline  Date of Exam:   09/04/2024 Medical Rec #: [id number]        Accession #:    [id number] Date of Birth: [birth year]        Patient Gender: F Patient Age:   68 years Exam Location:  [facility] Procedure:      VAS US  LOWER EXTREMITY VENOUS (DVT) Referring Phys: BRIGIDA BUREAU --------------------------------------------------------------------------------  Indications: Pain.  Risk Factors: None identified. Comparison Study: No prior studies. Performing Technologist: Cordella Collet RVT  Examination Guidelines: A complete evaluation includes B-mode imaging, spectral Doppler, color Doppler, and power Doppler as needed of all accessible portions of each vessel. Bilateral testing is considered an integral part of a complete examination. Limited examinations for reoccurring indications may be performed as noted. The reflux portion of the exam is performed with the patient in reverse Trendelenburg.  +---------+---------------+---------+-----------+----------+--------------+ RIGHT    CompressibilityPhasicitySpontaneityPropertiesThrombus Aging +---------+---------------+---------+-----------+----------+--------------+ CFV      Full           Yes  Yes                                 +---------+---------------+---------+-----------+----------+--------------+ SFJ      Full                                                        +---------+---------------+---------+-----------+----------+--------------+ FV Prox  Full                                                        +---------+---------------+---------+-----------+----------+--------------+ FV  Mid   Full                                                        +---------+---------------+---------+-----------+----------+--------------+ FV DistalFull                                                        +---------+---------------+---------+-----------+----------+--------------+ PFV      Full                                                        +---------+---------------+---------+-----------+----------+--------------+ POP      Full           Yes      Yes                                 +---------+---------------+---------+-----------+----------+--------------+ PTV      Full                                                        +---------+---------------+---------+-----------+----------+--------------+ PERO     Full                                                        +---------+---------------+---------+-----------+----------+--------------+   +---------+---------------+---------+-----------+----------+--------------+ LEFT     CompressibilityPhasicitySpontaneityPropertiesThrombus Aging +---------+---------------+---------+-----------+----------+--------------+ CFV      Full           Yes      Yes                                 +---------+---------------+---------+-----------+----------+--------------+ SFJ      Full                                                        +---------+---------------+---------+-----------+----------+--------------+  FV Prox  Full                                                        +---------+---------------+---------+-----------+----------+--------------+ FV Mid   Full                                                        +---------+---------------+---------+-----------+----------+--------------+ FV DistalFull                                                        +---------+---------------+---------+-----------+----------+--------------+ PFV      Full                                                         +---------+---------------+---------+-----------+----------+--------------+ POP      Full           Yes      Yes                                 +---------+---------------+---------+-----------+----------+--------------+ PTV      Full                                                        +---------+---------------+---------+-----------+----------+--------------+ PERO     Full                                                        +---------+---------------+---------+-----------+----------+--------------+     Summary: RIGHT: - There is no evidence of deep vein thrombosis in the lower extremity.  - No cystic structure found in the popliteal fossa.  LEFT: - There is no evidence of deep vein thrombosis in the lower extremity.  - No cystic structure found in the popliteal fossa.  *See table(s) above for measurements and observations. Electronically signed by Lonni Gaskins MD on 09/04/2024 at 3:29:44 PM.    Final    DG Foot 2 Views Right Result Date: 09/04/2024 EXAM: 1 or 2 VIEW(S) XRAY OF THE FOOT 09/04/2024 01:16:00 PM COMPARISON: None available. CLINICAL HISTORY: Pain FINDINGS: BONES AND JOINTS: No acute fracture. Moderate hallux valgus deformity. Mild degenerative changes identified at the 1st MTP joint. 2nd through 4th hammertoe deformities. SOFT TISSUES: The soft tissues are unremarkable. IMPRESSION: 1. Moderate hallux valgus deformity 2nd through 4th hammertoe deformities. 2. No acute fracture or subluxation. Electronically signed by: Waddell Calk MD 09/04/2024 02:48 PM EST RP Workstation: [id number]   Patient Profile: Lynn Kline  is a 68 y.o. female with past medical history of anxiety, COPD on home oxygen  3 L nasal cannula, diverticulosis and colon polyps. Admitted 09/02/2024 with urinary incontinence and left-sided abdominal pain which progressively worsened over the past 2 weeks.   Assessment / Plan:  68 year old female with urinary incontinence,  LLQ pain and constipation x 2 weeks. 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 Evaluated by general surgery, no indication for urgent surgery at this time.  On IV Ceftriaxone and Flagyl.  WBC 9.1 -> 11.8. Afebrile.  Hemodynamically stable. - Soft diet - Pain management and IV fluids per the hospitalist - Continue Flagyl 500 mg IV twice daily and Ceftriaxone 2 g IV daily, recommend 2 week course of abx - CBC in am - Colonoscopy as outpatient in 4-6 weeks.  Loose stools, likely secondary to antibiotics  - Nursing staff to document bowel movements Q shift   Normocytic anemia. Hg 12 -> 10.8 -> 10.4 -> today Hg 10.6. No overt GI bleeding. - CBC in am   COPD, on home oxygen  3 L nasal cannula. On Albuterol  neb and Breztri  (budesonide ) inhaler.   History of hyperplastic polyps per colonoscopy 02/2012       Principal Problem:   Colonic fistula     LOS: 3 days   Elida CHRISTELLA Shawl  09/05/2024, 10:56 AM  ---------------------------------------------------------------------------------------  I have taken a history, reviewed the chart and examined the patient. I performed a substantive portion of this encounter, including complete performance of at least one of the key components, in conjunction with the APP. I agree with the APP's note, impression and recommendations  Patient showing clinical improvement from diverticulitis standpoint.  Tolerating soft diet today.  Left lower quadrant pain improving.  Afebrile.  Still having difficulty with leg pain and mobility. No new recommendations today, continue IV antibiotics while admitted.  Rabiah Goeser E. Stacia, MD [facility]  I spent a total of 35 minutes reviewing the patient's medical record, interviewing and examining the patient, discussing her diagnosis and management of her condition going forward,  and documenting in the medical record

## 2024-09-05 NOTE — Progress Notes (Signed)
 Mobility Specialist - Progress Note  (NC3L) Pre-mobility: 96 bpm HR, 95% SpO2 During mobility: 123 bpm HR, 94% SpO2   09/05/24 0937  Mobility  Activity Ambulated with assistance  Level of Assistance Minimal assist, patient does 75% or more  Assistive Device Front wheel walker  Distance Ambulated (ft) 60 ft  Range of Motion/Exercises Active  Activity Response Tolerated fair  Mobility visit 1 Mobility  Mobility Specialist Start Time (ACUTE ONLY) [id number]  Mobility Specialist Stop Time (ACUTE ONLY) [id number]  Mobility Specialist Time Calculation (min) (ACUTE ONLY) 27 min   Pt was found in bed and agreeable to mobilize. Ambulated 33ft x2 with bathroom break in between for BM. C/o R foot pain.At EOS returned to recliner chair with all needs met. Call bell in reach.    Erminio Leos,  Mobility Specialist Can be reached via Secure Chat

## 2024-09-05 NOTE — Plan of Care (Signed)
  Problem: Clinical Measurements: Goal: Respiratory complications will improve Outcome: Progressing Goal: Cardiovascular complication will be avoided Outcome: Progressing   Problem: Activity: Goal: Risk for activity intolerance will decrease Outcome: Progressing   Problem: Nutrition: Goal: Adequate nutrition will be maintained Outcome: Progressing   Problem: Coping: Goal: Level of anxiety will decrease Outcome: Progressing   Problem: Elimination: Goal: Will not experience complications related to bowel motility Outcome: Progressing Goal: Will not experience complications related to urinary retention Outcome: Progressing   Problem: Pain Managment: Goal: General experience of comfort will improve and/or be controlled Outcome: Progressing   Problem: Safety: Goal: Ability to remain free from injury will improve Outcome: Progressing   Problem: Skin Integrity: Goal: Risk for impaired skin integrity will decrease Outcome: Progressing

## 2024-09-05 NOTE — Telephone Encounter (Signed)
 Patient has been scheduled for hospital colonoscopy at [facility] on 11/11/24 at 8 am, 630 am arrival.  She has been scheduled for in person previsit on 10/21/24 at 330 pm.  Patient is currently still in the hospital. I will contact her upon hospital discharge to advise of the information above.

## 2024-09-05 NOTE — Progress Notes (Signed)
   09/05/24 1244  TOC Brief Assessment  Insurance and Status Reviewed  Patient has primary care physician Yes  Home environment has been reviewed Home alone  Prior level of function: Independent  Prior/Current Home Services No current home services  Social Drivers of Health Review SDOH reviewed no interventions necessary  Readmission risk has been reviewed Yes  Transition of care needs  (ICM will continue to follow for potential DC needs)

## 2024-09-05 NOTE — Progress Notes (Signed)
 Progress Note     Subjective: Patient mobilizing with mobility specialist during encounter.  Patient reports that abdominal pain is manageable. She states that she did notice some discomfort with passing stool but this subsided. She is having bowel movements and flatulence. Denies nausea and vomiting. Tolerated FLD without concerns.   ROS  All negative with the exceptio of above.  Objective: Vital signs in last 24 hours: Temp:  [98.5 F (36.9 C)-98.8 F (37.1 C)] 98.7 F (37.1 C) (11/20 0539) Pulse Rate:  [99-101] 99 (11/20 0539) Resp:  [15-19] 15 (11/20 0539) BP: (110-127)/(59-65) 127/60 (11/20 0539) SpO2:  [97 %-98 %] 98 % (11/20 0859) Last BM Date : 09/04/24  Intake/Output from previous day: 11/19 0701 - 11/20 0700 In: 3 [I.V.:3] Out: 2375 [Urine:2375] Intake/Output this shift: No intake/output data recorded.  PE: Limited exam completed as patient encounter completed while she was working with mobility specialist.  General: Pleasant female who is walking with assistance of walker and faculty in NAD. HEENT: Head is normocephalic, atraumatic.  Lungs: Respiratory effort nonlabored on [place]. MS: Able to move all 4 extremities.  Psych: A&Ox3 with an appropriate affect.  GU: Foley catheter in place draining yellow clear urine.    Lab Results:  Recent Labs    09/04/24 0434 09/05/24 0503  WBC 9.1 11.8*  HGB 10.4* 10.6*  HCT 33.3* 33.4*  PLT 441* 434*   BMET Recent Labs    09/04/24 0434 09/05/24 0503  NA 140 138  K 3.7 3.5  CL 101 98  CO2 36* 33*  GLUCOSE 126* 97  BUN 6* <5*  CREATININE 0.54 0.55  CALCIUM  8.6* 9.0   PT/INR No results for input(s): LABPROT, INR in the last 72 hours. CMP     Component Value Date/Time   NA 138 09/05/2024 0503   K 3.5 09/05/2024 0503   CL 98 09/05/2024 0503   CO2 33 (H) 09/05/2024 0503   GLUCOSE 97 09/05/2024 0503   BUN <5 (L) 09/05/2024 0503   CREATININE 0.55 09/05/2024 0503   CALCIUM  9.0 09/05/2024 0503   PROT  5.9 (L) 09/05/2024 0503   ALBUMIN 3.3 (L) 09/05/2024 0503   AST 11 (L) 09/05/2024 0503   ALT 6 09/05/2024 0503   ALKPHOS 85 09/05/2024 0503   BILITOT 0.3 09/05/2024 0503   GFRNONAA >60 09/05/2024 0503   GFRAA >60 11/22/2016 0832   Lipase     Component Value Date/Time   LIPASE 21 11/22/2016 0832       Studies/Results: VAS US  LOWER EXTREMITY VENOUS (DVT) Result Date: 09/04/2024  Lower Venous DVT Study Patient Name:  MONTEEN TOOPS  Date of Exam:   09/04/2024 Medical Rec #: [id number]        Accession #:    [id number] Date of Birth: 1955-12-03        Patient Gender: F Patient Age:   37 years Exam Location:  [facility] Procedure:      VAS US  LOWER EXTREMITY VENOUS (DVT) Referring Phys: BRIGIDA BUREAU --------------------------------------------------------------------------------  Indications: Pain.  Risk Factors: None identified. Comparison Study: No prior studies. Performing Technologist: Cordella Collet RVT  Examination Guidelines: A complete evaluation includes B-mode imaging, spectral Doppler, color Doppler, and power Doppler as needed of all accessible portions of each vessel. Bilateral testing is considered an integral part of a complete examination. Limited examinations for reoccurring indications may be performed as noted. The reflux portion of the exam is performed with the patient in reverse Trendelenburg.  +---------+---------------+---------+-----------+----------+--------------+ RIGHT  CompressibilityPhasicitySpontaneityPropertiesThrombus Aging +---------+---------------+---------+-----------+----------+--------------+ CFV      Full           Yes      Yes                                 +---------+---------------+---------+-----------+----------+--------------+ SFJ      Full                                                        +---------+---------------+---------+-----------+----------+--------------+ FV Prox  Full                                                         +---------+---------------+---------+-----------+----------+--------------+ FV Mid   Full                                                        +---------+---------------+---------+-----------+----------+--------------+ FV DistalFull                                                        +---------+---------------+---------+-----------+----------+--------------+ PFV      Full                                                        +---------+---------------+---------+-----------+----------+--------------+ POP      Full           Yes      Yes                                 +---------+---------------+---------+-----------+----------+--------------+ PTV      Full                                                        +---------+---------------+---------+-----------+----------+--------------+ PERO     Full                                                        +---------+---------------+---------+-----------+----------+--------------+   +---------+---------------+---------+-----------+----------+--------------+ LEFT     CompressibilityPhasicitySpontaneityPropertiesThrombus Aging +---------+---------------+---------+-----------+----------+--------------+ CFV      Full           Yes      Yes                                 +---------+---------------+---------+-----------+----------+--------------+  SFJ      Full                                                        +---------+---------------+---------+-----------+----------+--------------+ FV Prox  Full                                                        +---------+---------------+---------+-----------+----------+--------------+ FV Mid   Full                                                        +---------+---------------+---------+-----------+----------+--------------+ FV DistalFull                                                         +---------+---------------+---------+-----------+----------+--------------+ PFV      Full                                                        +---------+---------------+---------+-----------+----------+--------------+ POP      Full           Yes      Yes                                 +---------+---------------+---------+-----------+----------+--------------+ PTV      Full                                                        +---------+---------------+---------+-----------+----------+--------------+ PERO     Full                                                        +---------+---------------+---------+-----------+----------+--------------+     Summary: RIGHT: - There is no evidence of deep vein thrombosis in the lower extremity.  - No cystic structure found in the popliteal fossa.  LEFT: - There is no evidence of deep vein thrombosis in the lower extremity.  - No cystic structure found in the popliteal fossa.  *See table(s) above for measurements and observations. Electronically signed by Lonni Gaskins MD on 09/04/2024 at 3:29:44 PM.    Final    DG Foot 2 Views Right Result Date: 09/04/2024 EXAM: 1 or 2 VIEW(S) XRAY OF THE FOOT 09/04/2024 01:16:00 PM COMPARISON: None available. CLINICAL HISTORY: Pain FINDINGS: BONES AND JOINTS: No acute  fracture. Moderate hallux valgus deformity. Mild degenerative changes identified at the 1st MTP joint. 2nd through 4th hammertoe deformities. SOFT TISSUES: The soft tissues are unremarkable. IMPRESSION: 1. Moderate hallux valgus deformity 2nd through 4th hammertoe deformities. 2. No acute fracture or subluxation. Electronically signed by: Waddell Calk MD 09/04/2024 02:48 PM EST RP Workstation: [id number]    Anti-infectives: Anti-infectives (From admission, onward)    Start     Dose/Rate Route Frequency Ordered Stop   09/03/24 1600  cefTRIAXone (ROCEPHIN) 2 g in sodium chloride  0.9 % 100 mL IVPB        2 g 200 mL/hr over 30 Minutes  Intravenous Every 24 hours 09/02/24 1944     09/03/24 0800  metroNIDAZOLE (FLAGYL) IVPB 500 mg        500 mg 100 mL/hr over 60 Minutes Intravenous Every 12 hours 09/02/24 1944     09/02/24 1715  metroNIDAZOLE (FLAGYL) IVPB 500 mg        500 mg 100 mL/hr over 60 Minutes Intravenous  Once 09/02/24 1710 09/02/24 1950   09/02/24 1530  cefTRIAXone (ROCEPHIN) 1 g in sodium chloride  0.9 % 100 mL IVPB        1 g 200 mL/hr over 30 Minutes Intravenous  Once 09/02/24 1529 09/02/24 1659        Assessment/Plan Sigmoid diverticulitis -CT from 11/17 showed findings suspicious for sigmoid diverticulitis, complicated by fistulous communication to the bladder with secondary cystitis. Extraluminal gas and soft tissue traversing the left hemipelvis, possibly involving the left ovary. This is suboptimally evaluated secondary to stone study technique. -Afebrile. Mild tachycardia overnight. Most recent vitals without elevated heart rate.  -WBC 11.8 from 9.1; HGB 10.6 from 10.4 -Tolerating FLD. Will advance to soft diet. Having bowel function and flatulence. Abdominal pain manageable. -GI planning for colonoscopy in 4 weeks. This has been scheduled for her. -Will plan for her to follow up outpatient with colorectal specialist after her colonoscopy.  -No indication for emergent surgery. -Continue with IV antibiotics -Will continue to follow.     FEN: FLD but will advance to soft; IVF per primary team VTE: SCDs. PI:[email or web address]    LOS: 3 days   I reviewed hospitalist notes, specialist notes, consulting provider notes, nursing notes, last 24 h vitals and pain scores, last 48 h intake and output, last 24 h labs and trends, and last 24 h imaging results.  This care required moderate level of medical decision making.    Marjorie Carlyon Favre, [facility] 09/05/2024, 9:28 AM Please see Amion for pager number during day hours 7:00am-4:30pm

## 2024-09-05 NOTE — Plan of Care (Signed)

## 2024-09-05 NOTE — Progress Notes (Signed)
 Mobility Specialist - Progress Note   09/05/24 1354  Mobility  Activity Ambulated with assistance  Level of Assistance Minimal assist, patient does 75% or more  Assistive Device Front wheel walker  Distance Ambulated (ft) 60 ft  Range of Motion/Exercises Active  Activity Response Tolerated fair  Mobility visit 1 Mobility  Mobility Specialist Start Time (ACUTE ONLY) 1340  Mobility Specialist Stop Time (ACUTE ONLY) 1354  Mobility Specialist Time Calculation (min) (ACUTE ONLY) 14 min   Pt had requested mobility specialist to return for a second visit today. Found in bathroom and agreeable to mobilize afterwards. C/o R foot. At EOS returned to bed with all needs met. Call bell in reach and sister in room.   Erminio Leos,  Mobility Specialist Can be reached via Secure Chat

## 2024-09-05 NOTE — Progress Notes (Signed)
 Progress Note   Patient: Lynn Kline FMW:[id number] DOB: 14-Aug-1956 DOA: 09/02/2024     3 DOS: the patient was seen and examined on 09/05/2024   Brief hospital course: The patient is a 68 yr old woman who carries a  medical history significant for anxiety, COPD, chronic hypoxic respiratory failure requiring 3L O2 at baseline, diverticulosis, and colon polyps. She presented to the [facility] ED on 09/02/2024 with complaints of urinary incontinence and left-sided abdominal pain which has worsened over the past 2 weeks. UA performed in the ED was positive for a UTI. Urine culture has grown out multiple species. Will repeat. WBC's were elevated at 11.4.  CT abdomen and pelvis demonstrated sigmoid diverticulitis complicated by a fistulous communication to the bladder with secondary cystitis. There was also extraluminous gas and soft tissue traversing the left hemipelvis possibly involving the left ovary. Gastroenterology and urology have been consulted. The patient is receiving IV ceftriaxone  and IV flagyl .  Today the patient is complaining of pain in her legs and feet bilaterally. In particular she is concerned that she has a fracture in her right ankle.  X-ray was obtained and was negative for any abnormality other than chronic abnormalities of her toes.  Assessment and Plan:  Sigmoid diverticulitis/ fistula with bladder/ possible free air General Surgery consulted. They have recommended gastroenterology consult. There is no need for surgical intervention at this time. Gastroenterology has recommended a colonoscopy in 4-6 weeks.  Continue IV antibiotics.   Emphysema without exacerbation She is at her baseline mild daily wheeze. Continue Breztri  and albuterol  nebulizer treatments   Anxiety  She is on Xanax  bid prn, but she takes it everyday   HLD holding Crestor   Hypoglycemia Due to NPO status. Patient has been started on D5 1/2 NS. Resolved. Patient is tolerating a clear liquid diet.  I have  seen and examined this patient myself. I have spent 34 minutes in her evaluation and care.  Subjective: The patient is resting comfortably. No new complaints.  Physical Exam: Vitals:   09/05/24 0856 09/05/24 0859 09/05/24 1352 09/05/24 1402  BP:    132/85  Pulse:    61  Resp:    20  Temp:    98.2 F (36.8 C)  TempSrc:    Oral  SpO2: 98% 98% 91% 100%   Exam:  Constitutional:  The patient is awake, alert, and oriented x 3. No acute distress. Eyes:  pupils and irises appear normal Normal lids and conjunctivae ENMT:  grossly normal hearing  Lips appear normal external ears, nose appear normal Oropharynx: mucosa, tongue,posterior pharynx appear normal Neck:  neck appears normal, no masses, normal ROM, supple no thyromegaly Respiratory:  No increased work of breathing. No wheezes, rales, or rhonchi No tactile fremitus Cardiovascular:  Regular rate and rhythm No murmurs, ectopy, or gallups. No lateral PMI. No thrills. Abdomen:  Abdomen is soft, non-tender, non-distended No hernias, masses, or organomegaly Normoactive bowel sounds.  Musculoskeletal:  No cyanosis, clubbing, or edema Bilateral lower extremities are without erythema, wounds, or lesions. There are no apparent bony deformities.  Skin:  No rashes, lesions, ulcers palpation of skin: no induration or nodules Neurologic:  CN 2-12 intact Sensation all 4 extremities intact Psychiatric:  Mental status Mood, affect appropriate Orientation to person, place, time  judgment and insight appear intact  Data Reviewed:  CBC, BMP  Family Communication: None available  Disposition: Status is: Inpatient Remains inpatient appropriate because: Need for subspecialty care and surgical intervention.  Planned Discharge Destination: tbd  Time spent: 34 minutes  Author: Denzel Etienne, DO 09/05/2024 5:31 PM  For on call review [email or web address].

## 2024-09-06 DIAGNOSIS — K5732 Diverticulitis of large intestine without perforation or abscess without bleeding: Secondary | ICD-10-CM | POA: Diagnosis not present

## 2024-09-06 DIAGNOSIS — K632 Fistula of intestine: Secondary | ICD-10-CM | POA: Diagnosis not present

## 2024-09-06 LAB — CBC WITH DIFFERENTIAL/PLATELET
Abs Immature Granulocytes: 0.04 K/uL (ref 0.00–0.07)
Basophils Absolute: 0 K/uL (ref 0.0–0.1)
Basophils Relative: 0 %
Eosinophils Absolute: 0.3 K/uL (ref 0.0–0.5)
Eosinophils Relative: 2 %
HCT: 30.7 % — ABNORMAL LOW (ref 36.0–46.0)
Hemoglobin: 9.7 g/dL — ABNORMAL LOW (ref 12.0–15.0)
Immature Granulocytes: 0 %
Lymphocytes Relative: 21 %
Lymphs Abs: 2.4 K/uL (ref 0.7–4.0)
MCH: 29 pg (ref 26.0–34.0)
MCHC: 31.6 g/dL (ref 30.0–36.0)
MCV: 91.9 fL (ref 80.0–100.0)
Monocytes Absolute: 0.8 K/uL (ref 0.1–1.0)
Monocytes Relative: 7 %
Neutro Abs: 7.9 K/uL — ABNORMAL HIGH (ref 1.7–7.7)
Neutrophils Relative %: 70 %
Platelets: 377 K/uL (ref 150–400)
RBC: 3.34 MIL/uL — ABNORMAL LOW (ref 3.87–5.11)
RDW: 13.6 % (ref 11.5–15.5)
WBC: 11.3 K/uL — ABNORMAL HIGH (ref 4.0–10.5)
nRBC: 0 % (ref 0.0–0.2)

## 2024-09-06 LAB — BASIC METABOLIC PANEL WITH GFR
Anion gap: 5 (ref 5–15)
BUN: 7 mg/dL — ABNORMAL LOW (ref 8–23)
CO2: 34 mmol/L — ABNORMAL HIGH (ref 22–32)
Calcium: 8.5 mg/dL — ABNORMAL LOW (ref 8.9–10.3)
Chloride: 100 mmol/L (ref 98–111)
Creatinine, Ser: 0.46 mg/dL (ref 0.44–1.00)
GFR, Estimated: >60 mL/min (ref >60)
Glucose, Bld: 92 mg/dL (ref 70–99)
Potassium: 3.7 mmol/L (ref 3.5–5.1)
Sodium: 138 mmol/L (ref 135–145)

## 2024-09-06 LAB — GLUCOSE, CAPILLARY: Glucose-Capillary: 107 mg/dL — ABNORMAL HIGH (ref 70–99)

## 2024-09-06 NOTE — Plan of Care (Signed)

## 2024-09-06 NOTE — Progress Notes (Signed)
 Progress Note   Patient: Lynn Kline FMW:[id number] DOB: 13-Dec-1955 DOA: 09/02/2024     4 DOS: the patient was seen and examined on 09/06/2024   Brief hospital course: The patient is a 68 yr old woman who carries a  medical history significant for anxiety, COPD, chronic hypoxic respiratory failure requiring 3L O2 at baseline, diverticulosis, and colon polyps. She presented to the [facility] ED on 09/02/2024 with complaints of urinary incontinence and left-sided abdominal pain which has worsened over the past 2 weeks. UA performed in the ED was positive for a UTI. Urine culture has grown out multiple species. Will repeat. WBC's were elevated at 11.4.  CT abdomen and pelvis demonstrated sigmoid diverticulitis complicated by a fistulous communication to the bladder with secondary cystitis. There was also extraluminous gas and soft tissue traversing the left hemipelvis possibly involving the left ovary. Gastroenterology and urology have been consulted. The patient is receiving IV ceftriaxone  and IV flagyl .  Today the patient is complaining of pain in her legs and feet bilaterally. In particular she is concerned that she has a fracture in her right ankle.  X-ray was obtained and was negative for any abnormality other than chronic abnormalities of her toes.  Assessment and Plan:  Sigmoid diverticulitis/ fistula with bladder/ possible free air General Surgery consulted. They have recommended gastroenterology consult. There is no need for surgical intervention at this time. Gastroenterology has recommended a colonoscopy in 4-6 weeks.  Continue IV antibiotics.   Emphysema without exacerbation She is at her baseline mild daily wheeze. Continue Breztri  and albuterol  nebulizer treatments   Anxiety  She is on Xanax  bid prn, but she takes it everyday   HLD holding Crestor   Hypoglycemia Due to NPO status. Patient has been started on D5 1/2 NS. Resolved. Patient is tolerating a clear liquid diet.  I have  seen and examined this patient myself. I have spent 34 minutes in her evaluation and care.  Subjective: The patient is resting comfortably. No new complaints.  Physical Exam: Vitals:   09/06/24 0812 09/06/24 0918 09/06/24 1154 09/06/24 1445  BP: (!) 92/56  94/65   Pulse: 97  88   Resp: 20  15   Temp: 99.3 F (37.4 C)  98.4 F (36.9 C)   TempSrc: Oral     SpO2: 96% 93% 98% 99%   Exam:  Constitutional:  The patient is awake, alert, and oriented x 3. No acute distress. Eyes:  pupils and irises appear normal Normal lids and conjunctivae ENMT:  grossly normal hearing  Lips appear normal external ears, nose appear normal Oropharynx: mucosa, tongue,posterior pharynx appear normal Neck:  neck appears normal, no masses, normal ROM, supple no thyromegaly Respiratory:  No increased work of breathing. No wheezes, rales, or rhonchi No tactile fremitus Cardiovascular:  Regular rate and rhythm No murmurs, ectopy, or gallups. No lateral PMI. No thrills. Abdomen:  Abdomen is soft, non-tender, non-distended No hernias, masses, or organomegaly Normoactive bowel sounds.  Musculoskeletal:  No cyanosis, clubbing, or edema Bilateral lower extremities are without erythema, wounds, or lesions. There are no apparent bony deformities.  Skin:  No rashes, lesions, ulcers palpation of skin: no induration or nodules Neurologic:  CN 2-12 intact Sensation all 4 extremities intact Psychiatric:  Mental status Mood, affect appropriate Orientation to person, place, time  judgment and insight appear intact  Data Reviewed:  CBC, BMP  Family Communication: None available  Disposition: Status is: Inpatient Remains inpatient appropriate because: Need for subspecialty care and surgical intervention.  Planned  Discharge Destination: tbd {Tip this will not be part of the note when signed  DVT Prophylaxis  ., Scds  (Optional):[id number]}   Time spent: 34 minutes  Author: Leatrice LILLETTE Chapel,  MD 09/06/2024 3:15 PM  For on call review [email or web address].

## 2024-09-06 NOTE — Progress Notes (Signed)
 Patient ID: Lynn Kline, female   DOB: 01-04-1956, 68 y.o.   MRN: [id number]   Acute Care Surgery Service Progress Note:    Chief Complaint/Subjective: Had 1 episode of nausea; some loose stools - unsure of color Min pain   Objective: Vital signs in last 24 hours: Temp:  [98.1 F (36.7 C)-99.3 F (37.4 C)] 98.4 F (36.9 C) (11/21 1154) Pulse Rate:  [61-97] 88 (11/21 1154) Resp:  [15-20] 15 (11/21 1154) BP: (92-132)/(56-85) 94/65 (11/21 1154) SpO2:  [91 %-100 %] 98 % (11/21 1154) FiO2 (%):  [28 %] 28 % (11/21 0918) Last BM Date : (P) 09/05/24  Intake/Output from previous day: 11/20 0701 - 11/21 0700 In: 243 [P.O.:240; I.V.:3] Out: 1300 [Urine:1300] Intake/Output this shift: Total I/O In: 100 [P.O.:100] Out: -   Lungs: nonlabored  Cardiovascular: reg  Abd: soft, obese, min TTP LLQ  Extremities: no edema, +SCDs  Neuro: alert, nonfocal  Lab Results: CBC  Recent Labs    09/05/24 0503 09/06/24 0427  WBC 11.8* 11.3*  HGB 10.6* 9.7*  HCT 33.4* 30.7*  PLT 434* 377   BMET Recent Labs    09/05/24 0503 09/06/24 0427  NA 138 138  K 3.5 3.7  CL 98 100  CO2 33* 34*  GLUCOSE 97 92  BUN <5* 7*  CREATININE 0.55 0.46  CALCIUM  9.0 8.5*   LFT    Latest Ref Rng & Units 09/05/2024    5:03 AM 09/04/2024    4:34 AM 11/21/2023    3:49 PM  Hepatic Function  Total Protein 6.5 - 8.1 g/dL 5.9  5.9  7.0   Albumin 3.5 - 5.0 g/dL 3.3  3.3  3.8   AST 15 - 41 U/L 11  16  32   ALT 0 - 44 U/L 6  7  24    Alk Phosphatase 38 - 126 U/L 85  89  79   Total Bilirubin 0.0 - 1.2 mg/dL 0.3  0.2  1.0    PT/INR No results for input(s): LABPROT, INR in the last 72 hours. ABG No results for input(s): PHART, HCO3 in the last 72 hours.  Invalid input(s): PCO2, PO2  Studies/Results:  Anti-infectives: Anti-infectives (From admission, onward)    Start     Dose/Rate Route Frequency Ordered Stop   09/03/24 1600  cefTRIAXone  (ROCEPHIN ) 2 g in sodium chloride  0.9 % 100  mL IVPB        2 g 200 mL/hr over 30 Minutes Intravenous Every 24 hours 09/02/24 1944     09/03/24 0800  metroNIDAZOLE  (FLAGYL ) IVPB 500 mg        500 mg 100 mL/hr over 60 Minutes Intravenous Every 12 hours 09/02/24 1944     09/02/24 1715  metroNIDAZOLE  (FLAGYL ) IVPB 500 mg        500 mg 100 mL/hr over 60 Minutes Intravenous  Once 09/02/24 1710 09/02/24 1950   09/02/24 1530  cefTRIAXone  (ROCEPHIN ) 1 g in sodium chloride  0.9 % 100 mL IVPB        1 g 200 mL/hr over 30 Minutes Intravenous  Once 09/02/24 1529 09/02/24 1659       Medications: Scheduled Meds:  albuterol   2.5 mg Nebulization TID   budesonide -glycopyrrolate -formoterol   2 puff Inhalation BID AC & HS   gabapentin   200 mg Oral QHS   sodium chloride  flush  3 mL Intravenous Q12H   Continuous Infusions:  cefTRIAXone  (ROCEPHIN )  IV 2 g (09/05/24 1550)   metronidazole  500 mg (09/06/24 0818)  PRN Meds:.acetaminophen  **OR** acetaminophen , albuterol , ALPRAZolam , ondansetron  **OR** ondansetron  (ZOFRAN ) IV, mouth rinse  Assessment/Plan: Patient Active Problem List   Diagnosis Date Noted   Colonic fistula 09/02/2024   Chronic respiratory failure with hypoxia (HCC) 05/04/2023   Prediabetes 04/13/2023   Senile purpura 04/13/2023   GAD (generalized anxiety disorder) 04/13/2023   Polyp of colon 10/28/2022   Vitamin D  deficiency 12/22/2021   B12 deficiency 12/22/2021   Lung nodule    Coronary atherosclerosis due to calcified coronary lesion 08/13/2020   Atherosclerosis of aorta 08/13/2020   Gallstones 11/06/2014   Hyperlipidemia with target LDL less than 130 08/19/2014   Dysphagia, pharyngoesophageal phase 08/12/2014   IBS (irritable bowel syndrome) 08/12/2014   COPD with asthma (HCC) 11/15/2011   Tobacco abuse 11/15/2011   Sigmoid diverticulitis -CT from 11/17 showed findings suspicious for sigmoid diverticulitis, complicated by fistulous communication to the bladder with secondary cystitis. Extraluminal gas and soft tissue  traversing the left hemipelvis, possibly involving the left ovary. This is suboptimally evaluated secondary to stone study technique. -Afebrile.  -WBC stable at 11; HGB down 1 point to 9.7 from 10.6 -Tolerating soft diet Having bowel function and flatulence. Abdominal pain manageable. -GI planning for colonoscopy in 4 weeks. This has been scheduled for her. -pt will need total of 2 weeks of abx therapy  -Will plan for her to follow up outpatient with colorectal specialist after her colonoscopy.  -No indication for emergent surgery. -Continue with IV antibiotics -Will continue to follow. -pt nearing dc from our standpoint.     FEN: soft ; IVF per primary team VTE: SCDs. ID:[email or web address] /[email or web address]     I reviewed hospitalist notes, specialist notes, consulting provider notes, nursing notes, last 24 h vitals and pain scores, last 48 h intake and output, last 24 h labs and trends, and last 24 h imaging results.   This care required moderate level of medical decision making.  Disposition:  LOS: 4 days    Camellia HERO. Tanda, MD, FACS General, Bariatric, & Minimally Invasive Surgery [phone] [facility], [facility]

## 2024-09-06 NOTE — Progress Notes (Signed)
 Mobility Specialist - Progress Note  [place] 3 L Pre-mobility: 93% SpO2 During mobility: 90-91% SpO2 Post-mobility: 97% SPO2   09/06/24 1356  Mobility  Activity Ambulated with assistance  Level of Assistance Contact guard assist, steadying assist  Assistive Device Front wheel walker  Distance Ambulated (ft) 45 ft  Activity Response Tolerated well  Mobility Referral Yes  Mobility visit 1 Mobility  Mobility Specialist Start Time (ACUTE ONLY) 1335  Mobility Specialist Stop Time (ACUTE ONLY) 1355  Mobility Specialist Time Calculation (min) (ACUTE ONLY) 20 min   Pt was received in recliner and agreed to mobility. Pt stated pain in lower extremities but continued ambulation. Returned to recliner with all needs met. Call bell in reach. RN notified.   [employer] - Mobility Specialist

## 2024-09-06 NOTE — Progress Notes (Addendum)
 [facility] Progress Note  CC:  Diverticulitis, possible colovesical fistula   Subjective: She ate eggs and fruit for breakfast and drink a cup of coffee.  No nausea or vomiting.  No significant abdominal pain.  She continues to have mild tenderness to the LLQ if she presses on this area.  She passed 2 dark brown episodes of diarrhea yesterday, no BM yet today.  She continues to have bilateral leg pain and noted her right foot was red and swollen yesterday.  No chest pain or shortness of breath.   Objective:  Vital signs in last 24 hours: Temp:  [98.1 F (36.7 C)-99.3 F (37.4 C)] 99.3 F (37.4 C) (11/21 0812) Pulse Rate:  [61-97] 97 (11/21 0812) Resp:  [18-20] 20 (11/21 0812) BP: (92-132)/(56-85) 92/56 (11/21 0812) SpO2:  [91 %-100 %] 93 % (11/21 0918) FiO2 (%):  [28 %] 28 % (11/21 0918) Last BM Date : (P) 09/05/24 General: Alert, well-developed in NAD Heart: Regular rate and rhythm, no murmurs. Pulm: Breath sounds with faint scattered expiratory wheezes throughout.  On oxygen  2 L nasal cannula. Abdomen:  Soft, nondistended.  Mild tenderness to the LLQ without rebound or guarding.  Positive bowel sounds to all 4 quadrants.  Extremities: No lower extremity edema. Neurologic:  Alert and oriented x 4. Grossly normal neurologically. Psych:  Alert and cooperative. Normal mood and affect.  Intake/Output from previous day: 11/20 0701 - 11/21 0700 In: 243 [P.O.:240; I.V.:3] Out: 1300 [Urine:1300] Intake/Output this shift: Total I/O In: 100 [P.O.:100] Out: -   Lab Results: Recent Labs    09/04/24 0434 09/05/24 0503 09/06/24 0427  WBC 9.1 11.8* 11.3*  HGB 10.4* 10.6* 9.7*  HCT 33.3* 33.4* 30.7*  PLT 441* 434* 377   BMET Recent Labs    09/04/24 0434 09/05/24 0503 09/06/24 0427  NA 140 138 138  K 3.7 3.5 3.7  CL 101 98 100  CO2 36* 33* 34*  GLUCOSE 126* 97 92  BUN 6* <5* 7*  CREATININE 0.54 0.55 0.46  CALCIUM  8.6* 9.0 8.5*   LFT Recent Labs     09/05/24 0503  PROT 5.9*  ALBUMIN 3.3*  AST 11*  ALT 6  ALKPHOS 85  BILITOT 0.3   PT/INR No results for input(s): LABPROT, INR in the last 72 hours. Hepatitis Panel No results for input(s): HEPBSAG, HCVAB, HEPAIGM, HEPBIGM in the last 72 hours.  VAS US  LOWER EXTREMITY VENOUS (DVT) Result Date: 09/04/2024  Lower Venous DVT Study Patient Name:  JOEL MERICLE  Date of Exam:   09/04/2024 Medical Rec #: [id number]        Accession #:    [id number] Date of Birth: Jul 27, 1956        Patient Gender: F Patient Age:   68 years Exam Location:  [facility] Procedure:      VAS US  LOWER EXTREMITY VENOUS (DVT) Referring Phys: BRIGIDA BUREAU --------------------------------------------------------------------------------  Indications: Pain.  Risk Factors: None identified. Comparison Study: No prior studies. Performing Technologist: Cordella Collet RVT  Examination Guidelines: A complete evaluation includes B-mode imaging, spectral Doppler, color Doppler, and power Doppler as needed of all accessible portions of each vessel. Bilateral testing is considered an integral part of a complete examination. Limited examinations for reoccurring indications may be performed as noted. The reflux portion of the exam is performed with the patient in reverse Trendelenburg.  +---------+---------------+---------+-----------+----------+--------------+ RIGHT    CompressibilityPhasicitySpontaneityPropertiesThrombus Aging +---------+---------------+---------+-----------+----------+--------------+ CFV      Full  Yes      Yes                                 +---------+---------------+---------+-----------+----------+--------------+ SFJ      Full                                                        +---------+---------------+---------+-----------+----------+--------------+ FV Prox  Full                                                         +---------+---------------+---------+-----------+----------+--------------+ FV Mid   Full                                                        +---------+---------------+---------+-----------+----------+--------------+ FV DistalFull                                                        +---------+---------------+---------+-----------+----------+--------------+ PFV      Full                                                        +---------+---------------+---------+-----------+----------+--------------+ POP      Full           Yes      Yes                                 +---------+---------------+---------+-----------+----------+--------------+ PTV      Full                                                        +---------+---------------+---------+-----------+----------+--------------+ PERO     Full                                                        +---------+---------------+---------+-----------+----------+--------------+   +---------+---------------+---------+-----------+----------+--------------+ LEFT     CompressibilityPhasicitySpontaneityPropertiesThrombus Aging +---------+---------------+---------+-----------+----------+--------------+ CFV      Full           Yes      Yes                                 +---------+---------------+---------+-----------+----------+--------------+ SFJ  Full                                                        +---------+---------------+---------+-----------+----------+--------------+ FV Prox  Full                                                        +---------+---------------+---------+-----------+----------+--------------+ FV Mid   Full                                                        +---------+---------------+---------+-----------+----------+--------------+ FV DistalFull                                                         +---------+---------------+---------+-----------+----------+--------------+ PFV      Full                                                        +---------+---------------+---------+-----------+----------+--------------+ POP      Full           Yes      Yes                                 +---------+---------------+---------+-----------+----------+--------------+ PTV      Full                                                        +---------+---------------+---------+-----------+----------+--------------+ PERO     Full                                                        +---------+---------------+---------+-----------+----------+--------------+     Summary: RIGHT: - There is no evidence of deep vein thrombosis in the lower extremity.  - No cystic structure found in the popliteal fossa.  LEFT: - There is no evidence of deep vein thrombosis in the lower extremity.  - No cystic structure found in the popliteal fossa.  *See table(s) above for measurements and observations. Electronically signed by Lonni Gaskins MD on 09/04/2024 at 3:29:44 PM.    Final    DG Foot 2 Views Right Result Date: 09/04/2024 EXAM: 1 or 2 VIEW(S) XRAY OF THE FOOT 09/04/2024 01:16:00 PM COMPARISON: None available. CLINICAL HISTORY: Pain FINDINGS: BONES AND JOINTS: No acute fracture. Moderate hallux valgus deformity. Mild  degenerative changes identified at the 1st MTP joint. 2nd through 4th hammertoe deformities. SOFT TISSUES: The soft tissues are unremarkable. IMPRESSION: 1. Moderate hallux valgus deformity 2nd through 4th hammertoe deformities. 2. No acute fracture or subluxation. Electronically signed by: Waddell Calk MD 09/04/2024 02:48 PM EST RP Workstation: [id number]   Patient Profile: Lynn Kline is a 68 y.o. female with past medical history of anxiety, COPD on home oxygen  3 L nasal cannula, diverticulosis and colon polyps. Admitted 09/02/2024 with urinary incontinence and left-sided abdominal  pain which progressively worsened over the past 2 weeks.   Assessment / Plan:  68 year old female with urinary incontinence, LLQ pain and constipation x 2 weeks. 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 Evaluated by general surgery, no indication for urgent surgery at this time.  On IV Ceftriaxone  and Flagyl .  WBC 11.3. Temp 99.3 F. BP 92/56. HR 97.  - Continue soft diet - Pain management and IV fluids per the hospitalist - Continue Flagyl  500 mg IV twice daily and Ceftriaxone  2 g IV daily, recommend 2 week course of abx - RN to repeat BP - Colonoscopy as outpatient in 4-6 weeks.   Loose stools, likely secondary to antibiotics  - Nursing staff to document bowel movements Q shift    Normocytic anemia.  B12 deficiency.  Hg 12 -> 10.8 -> 10.4 -> today Hg 10.6 -> 9.7. No overt GI bleeding.  No longer on maintenance IV fluid.  Patient reported passing dark brown, not black, diarrhea bowel movements x 2 yesterday. - CBC, iron panel and B12 level  in am - Monitor patient for GI bleeding   COPD, on home oxygen  2 L nasal cannula. On Albuterol  neb and Breztri  (budesonide ) inhaler.   History of hyperplastic polyps per colonoscopy 02/2012  Bilateral leg pain, right foot pain and swelling.  Right foot x-ray negative for acute fracture.  Bilateral lower extremity Doppler study negative for VTE. -Management per the hospitalist    Principal Problem:   Colonic fistula     LOS: 4 days   Elida CHRISTELLA Shawl  09/06/2024, 11:12 AM  -------------------------------------------------------------------------------------------------------------------------  I have taken a history, reviewed the chart and examined the patient. I performed a substantive portion of this encounter, including complete performance of at least one of the key components, in conjunction with the APP. I  agree with the APP's note, impression and recommendations  Patient continues to show clinical improvement.  Left lower quadrant pain minimal today.  No bowel movement yet when I saw her. The patient remains very nervous about her diagnosis and being discharged.  She is quite afraid of infection worsening out of the hospital.  I was unable to find consensus recommendations regarding specific antibiotic recommendations (route, duration) in the setting of diverticulitis complicated by colovesicular fistula.  I think transitioning to PO Flagyl  and ciprofloxacin in the next 24-48 hours is reasonable with a plan for 14 day course. Would recommend Foley discontinuation soon if not absolutely necessary.  Advance diet as tolerated.  Khyle Goodell E. Stacia, MD [facility]  I spent a total of 35 minutes reviewing the patient's medical record, interviewing and examining the patient, discussing her diagnosis and management of her condition going forward, and documenting in the medical record

## 2024-09-07 DIAGNOSIS — K5792 Diverticulitis of intestine, part unspecified, without perforation or abscess without bleeding: Principal | ICD-10-CM | POA: Diagnosis present

## 2024-09-07 DIAGNOSIS — K632 Fistula of intestine: Secondary | ICD-10-CM | POA: Diagnosis not present

## 2024-09-07 LAB — VITAMIN B12: Vitamin B-12: 340 pg/mL (ref 180–914)

## 2024-09-07 LAB — CBC
HCT: 30.2 % — ABNORMAL LOW (ref 36.0–46.0)
Hemoglobin: 9.4 g/dL — ABNORMAL LOW (ref 12.0–15.0)
MCH: 29 pg (ref 26.0–34.0)
MCHC: 31.1 g/dL (ref 30.0–36.0)
MCV: 93.2 fL (ref 80.0–100.0)
Platelets: 382 K/uL (ref 150–400)
RBC: 3.24 MIL/uL — ABNORMAL LOW (ref 3.87–5.11)
RDW: 13.7 % (ref 11.5–15.5)
WBC: 12.5 K/uL — ABNORMAL HIGH (ref 4.0–10.5)
nRBC: 0 % (ref 0.0–0.2)

## 2024-09-07 LAB — IRON AND TIBC
Iron: 10 ug/dL — ABNORMAL LOW (ref 28–170)
Saturation Ratios: 6 % — ABNORMAL LOW (ref 10.4–31.8)
TIBC: 176 ug/dL — ABNORMAL LOW (ref 250–450)
UIBC: 166 ug/dL

## 2024-09-07 LAB — GLUCOSE, CAPILLARY
Glucose-Capillary: 130 mg/dL — ABNORMAL HIGH (ref 70–99)
Glucose-Capillary: 129 mg/dL — ABNORMAL HIGH (ref 70–99)
Glucose-Capillary: 108 mg/dL — ABNORMAL HIGH (ref 70–99)

## 2024-09-07 LAB — FERRITIN: Ferritin: 300 ng/mL (ref 11–307)

## 2024-09-07 NOTE — Plan of Care (Signed)
   Problem: Clinical Measurements: Goal: Respiratory complications will improve Outcome: Progressing   Problem: Education: Goal: Knowledge of General Education information will improve Description: Including pain rating scale, medication(s)/side effects and non-pharmacologic comfort measures Outcome: Progressing

## 2024-09-07 NOTE — Plan of Care (Signed)

## 2024-09-07 NOTE — Progress Notes (Signed)
 Spoke with Dr. Tanda earlier in the shift, who wanted to know the status of the CT that was ordered.  Was unable to give an update but assured him I would speak with someone in CT tonight.  Just spoke with Selinda on CT.  States that since it is ordered routine and he is the only person in CT tonight, and that he is dealing with ED orders first, that it may not be until the morning before he is able to get to it.

## 2024-09-07 NOTE — Progress Notes (Signed)
 Mobility Specialist - Progress Note  [place] 3 L Pre-mobility: 100% SpO2 During mobility: 93-95% SpO2 Post-mobility: 96% SPO2   09/07/24 1455  Mobility  Activity Ambulated with assistance  Level of Assistance Contact guard assist, steadying assist  Assistive Device Front wheel walker  Distance Ambulated (ft) 90 ft  Activity Response Tolerated well  Mobility Referral Yes  Mobility visit 1 Mobility  Mobility Specialist Start Time (ACUTE ONLY) 1423  Mobility Specialist Stop Time (ACUTE ONLY) 1447  Mobility Specialist Time Calculation (min) (ACUTE ONLY) 24 min   Pt was received in bed and agreed to mobility. Pt stated pain in BLE but continued ambulation. Slight shortness of breath towards end of session but quickly recovered. Returned to bed with all needs met.   [employer] - Mobility Specialist

## 2024-09-07 NOTE — Progress Notes (Signed)
  Progress Note   Patient: Lynn Kline FMW:[id number] DOB: 01-21-1956 DOA: 09/02/2024     5 DOS: the patient was seen and examined on 09/07/2024   Brief hospital course: The patient is a 68 yr old woman who carries a  medical history significant for anxiety, COPD, chronic hypoxic respiratory failure requiring 3L O2 at baseline, diverticulosis, and colon polyps. She presented to the [facility] ED on 09/02/2024 with complaints of urinary incontinence and left-sided abdominal pain which has worsened over the past 2 weeks. UA performed in the ED was positive for a UTI. Urine culture has grown out multiple species. Will repeat. WBC's were elevated at 11.4.  CT abdomen and pelvis demonstrated sigmoid diverticulitis complicated by a fistulous communication to the bladder with secondary cystitis. There was also extraluminous gas and soft tissue traversing the left hemipelvis possibly involving the left ovary. Gastroenterology and urology have been consulted. The patient is receiving IV ceftriaxone  and IV flagyl .  Today the patient is complaining of pain in her legs and feet bilaterally. In particular she is concerned that she has a fracture in her right ankle.  X-ray was obtained and was negative for any abnormality other than chronic abnormalities of her toes.  09/06/2024: Patient seen.  Continues to report lower abdominal pain/discomfort. 09/07/2024: Patient seen.  No new complaints.  Surgery input is appreciated.  Repeat CT scan of abdomen and pelvis advised.  Assessment and Plan:  Sigmoid diverticulitis/ fistula with bladder/ possible free air General Surgery consulted. They have recommended gastroenterology consult. There is no need for surgical intervention at this time. Gastroenterology has recommended a colonoscopy in 4-6 weeks.  Continue IV antibiotics. 09/07/2024: Follow-up results of repeat CT abdomen and pelvis.   Emphysema without exacerbation She is at her baseline mild daily wheeze. Continue  Breztri  and albuterol  nebulizer treatments   Anxiety  She is on Xanax  bid prn, but she takes it everyday   HLD holding Crestor   Hypoglycemia Resolved.  Subjective:  No new complaints  Physical Exam: Vitals:   09/06/24 2121 09/06/24 2125 09/07/24 0613 09/07/24 0829  BP:   (!) 108/92   Pulse:   93 92  Resp:   18 18  Temp:   98.7 F (37.1 C)   TempSrc:   Oral   SpO2: 98% 98% 96%    Exam:  Constitutional:  The patient is awake and alert.  Patient is not in any distress.  Neck:  Supple.   Respiratory:  Clear to auscultation. Cardiovascular:  S1-S2. Abdomen:  Abdomen is soft.   Neurologic:  Patient is awake and alert.   Data Reviewed:  CBC, BMP  Family Communication:   Disposition: Status is: Inpatient Remains inpatient appropriate because: Need for subspecialty care and surgical intervention.  Planned Discharge Destination: tbd    Time spent: 35 minutes  Author: Leatrice LILLETTE Chapel, MD 09/07/2024 2:19 PM  For on call review [email or web address].

## 2024-09-07 NOTE — Progress Notes (Signed)
 [facility] ROUNDING NOTE   Subjective: Patient doing about the same.  Having more abdominal distention.  Had a moderate sized bowel movement today with semi-formed stool.  Tolerating soft diet.  LLQ pain about the same, a little improved.  No nausea/vomiting.     Objective: Vital signs in last 24 hours: Temp:  [98.4 F (36.9 C)-98.7 F (37.1 C)] 98.4 F (36.9 C) (11/22 1427) Pulse Rate:  [92-102] 102 (11/22 1427) Resp:  [18] 18 (11/22 1427) BP: (108-117)/(55-92) 110/55 (11/22 1427) SpO2:  [96 %-100 %] 100 % (11/22 1427) Last BM Date : 09/05/24 General: NAD, pleasant Caucasian female Lungs:  CTA b/l, no w/r/r Heart:  RRR, no m/r/g Abdomen:  Soft, mild distention, TTP in LLQ without rigidity or guarding, +BS Ext:  No c/c/e    Intake/Output from previous day: 11/21 0701 - 11/22 0700 In: 100 [P.O.:100] Out: 360 [Urine:360] Intake/Output this shift: Total I/O In: 240 [P.O.:240] Out: -    Lab Results: Recent Labs    09/05/24 0503 09/06/24 0427 09/07/24 0454  WBC 11.8* 11.3* 12.5*  HGB 10.6* 9.7* 9.4*  PLT 434* 377 382  MCV 91.5 91.9 93.2   BMET Recent Labs    09/05/24 0503 09/06/24 0427  NA 138 138  K 3.5 3.7  CL 98 100  CO2 33* 34*  GLUCOSE 97 92  BUN <5* 7*  CREATININE 0.55 0.46  CALCIUM  9.0 8.5*   LFT Recent Labs    09/05/24 0503  PROT 5.9*  ALBUMIN 3.3*  AST 11*  ALT 6  ALKPHOS 85  BILITOT 0.3   PT/INR No results for input(s): INR in the last 72 hours.    Imaging/Other results: No results found.    Assessment and Plan:  68 year old female with urinary incontinence, LLQ pain and constipation x 2 weeks. 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 Evaluated by general surgery, no indication for urgent surgery at this time.  On IV Ceftriaxone  and Flagyl .  WBC 11.3. Temp 99.3 F. BP 92/56. HR 97.  -  Continue soft diet - Pain management and IV fluids per the hospitalist - Continue Flagyl  500 mg IV twice daily and Ceftriaxone  2 g IV daily, recommend 2 week course of abx - Gen Surg recommending repeat CT w/contrast given increase in WBC; will follow up results - Colonoscopy as outpatient currently scheduled for Jan 26 (next available hospital slot)   Loose stools, likely secondary to antibiotics  - Nursing staff to document bowel movements Q shift    Normocytic anemia.  B12 deficiency.  Hg 12 -> 10.8 -> 10.4 -> today Hg 10.6 -> 9.7. No overt GI bleeding.  No longer on maintenance IV fluid.  Patient reported passing dark brown, not black, diarrhea bowel movements. - Monitor patient for GI bleeding   COPD, on home oxygen  2 L nasal cannula. On Albuterol  neb and Breztri  (budesonide ) inhaler.     Bilateral leg pain, right foot pain and swelling.  Right foot x-ray negative for acute fracture.  Bilateral lower extremity Doppler study negative for VTE. -Improving, management per the hospitalist     Glendia FORBES Holt, MD  09/07/2024, 5:26 PM [facility]  I spent a total of 35 minutes reviewing the patient's medical record, interviewing and examining the patient, discussing her diagnosis and management of her condition going forward, and documenting in the medical record

## 2024-09-07 NOTE — Progress Notes (Addendum)
   Subjective/Chief Complaint: Had small bm, minimal flatus, abdomen still sore, not walking much   Objective: Vital signs in last 24 hours: Temp:  [98.4 F (36.9 C)-99.3 F (37.4 C)] 98.7 F (37.1 C) (11/22 0613) Pulse Rate:  [88-97] 93 (11/22 0613) Resp:  [15-20] 18 (11/22 9386) BP: (92-117)/(56-92) 108/92 (11/22 0613) SpO2:  [93 %-99 %] 96 % (11/22 0613) FiO2 (%):  [28 %-32 %] 32 % (11/21 1445) Last BM Date : 09/05/24  Intake/Output from previous day: 11/21 0701 - 11/22 0700 In: 100 [P.O.:100] Out: 360 [Urine:360] Intake/Output this shift: No intake/output data recorded.  Ab soft tender llq and suprapubic  Lab Results:  Recent Labs    09/06/24 0427 09/07/24 0454  WBC 11.3* 12.5*  HGB 9.7* 9.4*  HCT 30.7* 30.2*  PLT 377 382   BMET Recent Labs    09/05/24 0503 09/06/24 0427  NA 138 138  K 3.5 3.7  CL 98 100  CO2 33* 34*  GLUCOSE 97 92  BUN <5* 7*  CREATININE 0.55 0.46  CALCIUM  9.0 8.5*   PT/INR No results for input(s): LABPROT, INR in the last 72 hours. ABG No results for input(s): PHART, HCO3 in the last 72 hours.  Invalid input(s): PCO2, PO2  Studies/Results: No results found.  Anti-infectives: Anti-infectives (From admission, onward)    Start     Dose/Rate Route Frequency Ordered Stop   09/03/24 1600  cefTRIAXone  (ROCEPHIN ) 2 g in sodium chloride  0.9 % 100 mL IVPB        2 g 200 mL/hr over 30 Minutes Intravenous Every 24 hours 09/02/24 1944     09/03/24 0800  metroNIDAZOLE  (FLAGYL ) IVPB 500 mg        500 mg 100 mL/hr over 60 Minutes Intravenous Every 12 hours 09/02/24 1944     09/02/24 1715  metroNIDAZOLE  (FLAGYL ) IVPB 500 mg        500 mg 100 mL/hr over 60 Minutes Intravenous  Once 09/02/24 1710 09/02/24 1950   09/02/24 1530  cefTRIAXone  (ROCEPHIN ) 1 g in sodium chloride  0.9 % 100 mL IVPB        1 g 200 mL/hr over 30 Minutes Intravenous  Once 09/02/24 1529 09/02/24 1659       Assessment/Plan: Sigmoid  diverticulitis -CT from 11/17 showed findings suspicious for sigmoid diverticulitis, complicated by fistulous communication to the bladder with secondary cystitis. Extraluminal gas and soft tissue traversing the left hemipelvis, possibly involving the left ovary. This is suboptimally evaluated secondary to stone study technique. -WBC up at 12.5 -GI planning for colonoscopy in 4 weeks. This has been scheduled for her. -pt will need total of 2 weeks of abx therapy  -Will plan for her to follow up outpatient with colorectal specialist after her colonoscopy.  -No indication for emergent surgery. -Continue with IV antibiotics (can consider change as well pending CT) -Will continue to follow. -due to ongoing pain and elevated wbc with suboptimal ct to start this I think she needs a CT with contrast today before I would be willing to say she is fine to go home    FEN: soft ; IVF per primary team VTE: SCDs. She is high risk for vte and would strongly consider pharm prophylaxis ID:Rocephin /flagyl   I reviewed hospitalist notes, last 24 h vitals and pain scores, last 48 h intake and output, last 24 h labs and trends, and last 24 h imaging results.I reviewed initial ct as well    Lynn Kline 09/07/2024

## 2024-09-08 ENCOUNTER — Inpatient Hospital Stay (HOSPITAL_COMMUNITY)

## 2024-09-08 DIAGNOSIS — K632 Fistula of intestine: Secondary | ICD-10-CM | POA: Diagnosis not present

## 2024-09-08 DIAGNOSIS — K5792 Diverticulitis of intestine, part unspecified, without perforation or abscess without bleeding: Secondary | ICD-10-CM | POA: Diagnosis not present

## 2024-09-08 LAB — GLUCOSE, CAPILLARY
Glucose-Capillary: 135 mg/dL — ABNORMAL HIGH (ref 70–99)
Glucose-Capillary: 81 mg/dL (ref 70–99)
Glucose-Capillary: 112 mg/dL — ABNORMAL HIGH (ref 70–99)
Glucose-Capillary: 119 mg/dL — ABNORMAL HIGH (ref 70–99)

## 2024-09-08 LAB — CBC WITH DIFFERENTIAL/PLATELET
Abs Immature Granulocytes: 0.05 K/uL (ref 0.00–0.07)
Basophils Absolute: 0.1 K/uL (ref 0.0–0.1)
Basophils Relative: 0 %
Eosinophils Absolute: 0.4 K/uL (ref 0.0–0.5)
Eosinophils Relative: 3 %
HCT: 33.1 % — ABNORMAL LOW (ref 36.0–46.0)
Hemoglobin: 10.3 g/dL — ABNORMAL LOW (ref 12.0–15.0)
Immature Granulocytes: 0 %
Lymphocytes Relative: 13 %
Lymphs Abs: 1.7 K/uL (ref 0.7–4.0)
MCH: 29.2 pg (ref 26.0–34.0)
MCHC: 31.1 g/dL (ref 30.0–36.0)
MCV: 93.8 fL (ref 80.0–100.0)
Monocytes Absolute: 0.8 K/uL (ref 0.1–1.0)
Monocytes Relative: 6 %
Neutro Abs: 9.9 K/uL — ABNORMAL HIGH (ref 1.7–7.7)
Neutrophils Relative %: 78 %
Platelets: 464 K/uL — ABNORMAL HIGH (ref 150–400)
RBC: 3.53 MIL/uL — ABNORMAL LOW (ref 3.87–5.11)
RDW: 13.9 % (ref 11.5–15.5)
WBC: 12.9 K/uL — ABNORMAL HIGH (ref 4.0–10.5)
nRBC: 0 % (ref 0.0–0.2)

## 2024-09-08 MED ORDER — IOHEXOL 300 MG/ML  SOLN
100.0000 mL | Freq: Once | INTRAMUSCULAR | Status: AC | PRN
  Administered 2024-09-08: 100 mL via INTRAVENOUS

## 2024-09-08 MED ORDER — ENOXAPARIN SODIUM 40 MG/0.4ML IJ SOSY
40.0000 mg | PREFILLED_SYRINGE | INTRAMUSCULAR | Status: DC
  Administered 2024-09-08 – 2024-09-16 (×9): 40 mg via SUBCUTANEOUS
  Filled 2024-09-08 (×9): qty 0.4

## 2024-09-08 NOTE — Plan of Care (Signed)

## 2024-09-08 NOTE — Progress Notes (Signed)
   Subjective/Chief Complaint: Ct not done yesterday for some reason, unchanged, bloating with eating soft diet, some flatus   Objective: Vital signs in last 24 hours: Temp:  [98.2 F (36.8 C)-98.5 F (36.9 C)] 98.5 F (36.9 C) (11/23 0544) Pulse Rate:  [90-102] 90 (11/23 0544) Resp:  [18] 18 (11/23 0544) BP: (110-118)/(55-62) 115/62 (11/23 0544) SpO2:  [95 %-100 %] 99 % (11/23 0544) Last BM Date : 09/05/24  Intake/Output from previous day: 11/22 0701 - 11/23 0700 In: 480 [P.O.:480] Out: 1250 [Urine:1250] Intake/Output this shift: No intake/output data recorded.  Ab soft mild tender llq and suprapubic nondistended  Lab Results:  Recent Labs    09/06/24 0427 09/07/24 0454  WBC 11.3* 12.5*  HGB 9.7* 9.4*  HCT 30.7* 30.2*  PLT 377 382   BMET Recent Labs    09/06/24 0427  NA 138  K 3.7  CL 100  CO2 34*  GLUCOSE 92  BUN 7*  CREATININE 0.46  CALCIUM  8.5*   PT/INR No results for input(s): LABPROT, INR in the last 72 hours. ABG No results for input(s): PHART, HCO3 in the last 72 hours.  Invalid input(s): PCO2, PO2  Studies/Results: No results found.  Anti-infectives: Anti-infectives (From admission, onward)    Start     Dose/Rate Route Frequency Ordered Stop   09/03/24 1600  cefTRIAXone  (ROCEPHIN ) 2 g in sodium chloride  0.9 % 100 mL IVPB        2 g 200 mL/hr over 30 Minutes Intravenous Every 24 hours 09/02/24 1944     09/03/24 0800  metroNIDAZOLE  (FLAGYL ) IVPB 500 mg        500 mg 100 mL/hr over 60 Minutes Intravenous Every 12 hours 09/02/24 1944     09/02/24 1715  metroNIDAZOLE  (FLAGYL ) IVPB 500 mg        500 mg 100 mL/hr over 60 Minutes Intravenous  Once 09/02/24 1710 09/02/24 1950   09/02/24 1530  cefTRIAXone  (ROCEPHIN ) 1 g in sodium chloride  0.9 % 100 mL IVPB        1 g 200 mL/hr over 30 Minutes Intravenous  Once 09/02/24 1529 09/02/24 1659       Assessment/Plan: Sigmoid diverticulitis -CT from 11/17 showed findings suspicious  for sigmoid diverticulitis, complicated by fistulous communication to the bladder with secondary cystitis. Extraluminal gas and soft tissue traversing the left hemipelvis, possibly involving the left ovary. This is suboptimally evaluated secondary to stone study technique. -WBC up at 12.5 yesterday, none today yet -GI planning for colonoscopy in 4 weeks. This has been scheduled for her. -pt will need total of 2 weeks of abx therapy  -Will plan for her to follow up outpatient with colorectal specialist after her colonoscopy if nothing shows up on this CT  -No indication for emergent surgery. -Continue with IV antibiotics (can consider change as well pending CT) -Will continue to follow. -CT ordered yesterday not done. Will await this prior to any decisions. Dont know that she is going home with other issues now anyways     FEN: soft ; IVF per primary team VTE: SCDs. She is high risk for vte and would strongly consider pharm prophylaxis ID:Rocephin /flagyl   I reviewed hospitalist notes, last 24 h vitals and pain scores, last 48 h intake and output, last 24 h labs and trends, and last 24 h imaging results.   Donnice Bury 09/08/2024

## 2024-09-08 NOTE — Progress Notes (Signed)
  Progress Note   Patient: Lynn Kline FMW:[id number] DOB: 21-Sep-1956 DOA: 09/02/2024     6 DOS: the patient was seen and examined on 09/08/2024   Brief hospital course: The patient is a 68 yr old woman who carries a  medical history significant for anxiety, COPD, chronic hypoxic respiratory failure requiring 3L O2 at baseline, diverticulosis, and colon polyps. She presented to the [facility] ED on 09/02/2024 with complaints of urinary incontinence and left-sided abdominal pain which has worsened over the past 2 weeks. UA performed in the ED was positive for a UTI. Urine culture has grown out multiple species. Will repeat. WBC's were elevated at 11.4.  CT abdomen and pelvis demonstrated sigmoid diverticulitis complicated by a fistulous communication to the bladder with secondary cystitis. There was also extraluminous gas and soft tissue traversing the left hemipelvis possibly involving the left ovary. Gastroenterology and urology have been consulted. The patient is receiving IV ceftriaxone  and IV flagyl .  Today the patient is complaining of pain in her legs and feet bilaterally. In particular she is concerned that she has a fracture in her right ankle.  X-ray was obtained and was negative for any abnormality other than chronic abnormalities of her toes.  09/06/2024: Patient seen.  Continues to report lower abdominal pain/discomfort. 09/07/2024: Patient seen.  No new complaints.  Surgery input is appreciated.  Repeat CT scan of abdomen and pelvis advised. 09/08/2024: Patient seen.  No new changes.  Awaiting CT scan of the abdomen and pelvis with contrast.  Surgery team is directing care.  Assessment and Plan:  Sigmoid diverticulitis/ fistula with bladder/ possible free air General Surgery consulted. They have recommended gastroenterology consult. There is no need for surgical intervention at this time. Gastroenterology has recommended a colonoscopy in 4-6 weeks.  Continue IV antibiotics. 09/07/2024:  Follow-up results of repeat CT abdomen and pelvis. 09/08/2024: Pursue CT scan of abdomen and pelvis.   Emphysema without exacerbation She is at her baseline mild daily wheeze. Continue Breztri  and albuterol  nebulizer treatments   Anxiety  She is on Xanax  bid prn, but she takes it everyday   HLD holding Crestor   Hypoglycemia Resolved.  Subjective:  No new complaints  Physical Exam: Vitals:   09/07/24 1934 09/08/24 0544 09/08/24 0745 09/08/24 0749  BP: 118/60 115/62    Pulse: 98 90    Resp: 18 18    Temp: 98.2 F (36.8 C) 98.5 F (36.9 C)    TempSrc: Oral Oral    SpO2: 100% 99% 98% 98%   Exam:  Constitutional:  The patient is awake and alert.  Patient is not in any distress.  Neck:  Supple.   Respiratory:  Clear to auscultation. Cardiovascular:  S1-S2. Abdomen:  Abdomen is soft.   Neurologic:  Patient is awake and alert.   Data Reviewed:  CBC, BMP  Family Communication:   Disposition: Status is: Inpatient Remains inpatient appropriate because: Need for subspecialty care and surgical intervention.  Planned Discharge Destination: tbd    Time spent: 35 minutes  Author: Leatrice LILLETTE Chapel, MD 09/08/2024 11:18 AM  For on call review [email or web address].

## 2024-09-08 NOTE — Progress Notes (Signed)
 [facility] ROUNDING NOTE   Subjective: Patient feeling about the same today.  She had a fairly normal bowel movement this morning.  Continues to feel very bloated and gassy.  CT ordered yesterday but has not yet been completed.   Objective: Vital signs in last 24 hours: Temp:  [98.2 F (36.8 C)-98.5 F (36.9 C)] 98.5 F (36.9 C) (11/23 0544) Pulse Rate:  [90-102] 90 (11/23 0544) Resp:  [18] 18 (11/23 0544) BP: (110-118)/(55-62) 115/62 (11/23 0544) SpO2:  [95 %-100 %] 98 % (11/23 0749) Last BM Date : 09/05/24 General: NAD Lungs:  CTA b/l, no w/r/r Heart:  RRR, no m/r/g Abdomen:  Soft, NT, mildly distended, +BS Ext:  No c/c/e    Intake/Output from previous day: 11/22 0701 - 11/23 0700 In: 480 [P.O.:480] Out: 1250 [Urine:1250] Intake/Output this shift: No intake/output data recorded.   Lab Results: Recent Labs    09/06/24 0427 09/07/24 0454  WBC 11.3* 12.5*  HGB 9.7* 9.4*  PLT 377 382  MCV 91.9 93.2   BMET Recent Labs    09/06/24 0427  NA 138  K 3.7  CL 100  CO2 34*  GLUCOSE 92  BUN 7*  CREATININE 0.46  CALCIUM  8.5*   LFT No results for input(s): PROT, ALBUMIN, AST, ALT, ALKPHOS, BILITOT, BILIDIR, IBILI in the last 72 hours. PT/INR No results for input(s): INR in the last 72 hours.    Imaging/Other results: No results found.    Assessment and Plan:  68 year old female with urinary incontinence, LLQ pain and constipation x 2 weeks. 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 Evaluated by general surgery, no indication for urgent surgery at this time.  On IV Ceftriaxone  and Flagyl .   Modest clinical improvement. - Continue soft diet - Pain management and IV fluids per the hospitalist - Continue Flagyl  500 mg IV twice daily and Ceftriaxone  2 g IV daily, recommend 2 week course of abx - Await  results of repeat CT with contrast -Defer Foley management to surgery/hospitalist - Colonoscopy as outpatient currently scheduled with me for Jan 26 (my next available hospital slot)   Loose stools, likely secondary to antibiotics  - Nursing staff to document bowel movements Q shift    Normocytic anemia.  B12 deficiency.  Hg 12 -> 10.8 -> 10.4 -> today Hg 10.6 -> 9.7. No overt GI bleeding.  No longer on maintenance IV fluid.  Patient reported passing dark brown, not black, diarrhea bowel movements. - Monitor patient for GI bleeding   COPD, on home oxygen  2 L nasal cannula. On Albuterol  neb and Breztri  (budesonide ) inhaler.     Bilateral leg pain, right foot pain and swelling.  Right foot x-ray negative for acute fracture.  Bilateral lower extremity Doppler study negative for VTE. -Improving, management per the hospitalist    Dr. Leigh will be taking over inpatient GI services tomorrow  Glendia FORBES Holt, MD  09/08/2024, 11:52 AM [facility]  Moderate complex medical decision making (this includes chart review, review of results, face-to-face time used for counseling as well as treatment plan and follow-up. The patient was provided an opportunity to ask questions and all were answered. The patient agreed with the plan and demonstrated an understanding of the instructions

## 2024-09-08 NOTE — Progress Notes (Signed)
 Mobility Specialist - Progress Note   09/08/24 1513  Mobility  Activity Ambulated with assistance  Level of Assistance Standby assist, set-up cues, supervision of patient - no hands on  Assistive Device Front wheel walker  Distance Ambulated (ft) 200 ft  Range of Motion/Exercises Active  Activity Response Tolerated well  Mobility visit 1 Mobility  Mobility Specialist Start Time (ACUTE ONLY) 1440  Mobility Specialist Stop Time (ACUTE ONLY) 1500  Mobility Specialist Time Calculation (min) (ACUTE ONLY) 20 min   Pt was found in bathroom and agreeable to mobilize. No complaints. At EOS returned to bed with all needs met. Call bell in reach.   Erminio Leos,  Mobility Specialist Can be reached via Secure Chat

## 2024-09-09 ENCOUNTER — Inpatient Hospital Stay (HOSPITAL_COMMUNITY)

## 2024-09-09 DIAGNOSIS — K5792 Diverticulitis of intestine, part unspecified, without perforation or abscess without bleeding: Secondary | ICD-10-CM

## 2024-09-09 DIAGNOSIS — K632 Fistula of intestine: Secondary | ICD-10-CM | POA: Diagnosis not present

## 2024-09-09 DIAGNOSIS — R188 Other ascites: Secondary | ICD-10-CM | POA: Diagnosis not present

## 2024-09-09 DIAGNOSIS — K5732 Diverticulitis of large intestine without perforation or abscess without bleeding: Secondary | ICD-10-CM | POA: Diagnosis not present

## 2024-09-09 DIAGNOSIS — N3090 Cystitis, unspecified without hematuria: Secondary | ICD-10-CM | POA: Diagnosis not present

## 2024-09-09 LAB — URINALYSIS, ROUTINE W REFLEX MICROSCOPIC
Bilirubin Urine: NEGATIVE
Glucose, UA: NEGATIVE mg/dL
Hgb urine dipstick: NEGATIVE
Ketones, ur: NEGATIVE mg/dL
Nitrite: NEGATIVE
Protein, ur: NEGATIVE mg/dL
Specific Gravity, Urine: 1.008 (ref 1.005–1.030)
pH: 7 (ref 5.0–8.0)

## 2024-09-09 LAB — CBC WITH DIFFERENTIAL/PLATELET
Abs Immature Granulocytes: 0.03 K/uL (ref 0.00–0.07)
Basophils Absolute: 0.1 K/uL (ref 0.0–0.1)
Basophils Relative: 1 %
Eosinophils Absolute: 0.4 K/uL (ref 0.0–0.5)
Eosinophils Relative: 4 %
HCT: 30.4 % — ABNORMAL LOW (ref 36.0–46.0)
Hemoglobin: 9.5 g/dL — ABNORMAL LOW (ref 12.0–15.0)
Immature Granulocytes: 0 %
Lymphocytes Relative: 21 %
Lymphs Abs: 2 K/uL (ref 0.7–4.0)
MCH: 29.1 pg (ref 26.0–34.0)
MCHC: 31.3 g/dL (ref 30.0–36.0)
MCV: 93 fL (ref 80.0–100.0)
Monocytes Absolute: 0.7 K/uL (ref 0.1–1.0)
Monocytes Relative: 7 %
Neutro Abs: 6.6 K/uL (ref 1.7–7.7)
Neutrophils Relative %: 67 %
Platelets: 405 K/uL — ABNORMAL HIGH (ref 150–400)
RBC: 3.27 MIL/uL — ABNORMAL LOW (ref 3.87–5.11)
RDW: 13.6 % (ref 11.5–15.5)
WBC: 9.7 K/uL (ref 4.0–10.5)
nRBC: 0 % (ref 0.0–0.2)

## 2024-09-09 MED ORDER — IOHEXOL 9 MG/ML PO SOLN
ORAL | Status: AC
  Filled 2024-09-09: qty 1000

## 2024-09-09 MED ORDER — IOHEXOL 9 MG/ML PO SOLN
500.0000 mL | ORAL | Status: AC
Start: 2024-09-09 — End: 2024-09-09
  Administered 2024-09-09 (×2): 500 mL via ORAL

## 2024-09-09 MED ORDER — CHLORHEXIDINE GLUCONATE CLOTH 2 % EX PADS
6.0000 | MEDICATED_PAD | Freq: Every day | CUTANEOUS | Status: DC
  Administered 2024-09-10 – 2024-09-11 (×2): 6 via TOPICAL

## 2024-09-09 MED ORDER — ALBUTEROL SULFATE (2.5 MG/3ML) 0.083% IN NEBU
2.5000 mg | INHALATION_SOLUTION | Freq: Two times a day (BID) | RESPIRATORY_TRACT | Status: DC
  Administered 2024-09-09 – 2024-09-16 (×14): 2.5 mg via RESPIRATORY_TRACT
  Filled 2024-09-09 (×14): qty 3

## 2024-09-09 NOTE — Progress Notes (Signed)
 Assessment & Plan: Sigmoid diverticulitis with colovesical fistula -CT 11/23 reviewed - contrast had not yet reached sigmoid colon - may need delayed views or repeat imaging per radiology -WBC 9.7 this AM -GI planning colonoscopy -await urology consultation -pt will need total of 2 weeks of abx therapy  -Will plan for her to follow up outpatient with colorectal specialist after her colonoscopy -Continue with IV antibiotics - Rocephin /flagyl  -Will continue to follow.   FEN: soft ; IVF per primary team VTE: SCDs. OK for pharm prophylaxis ID: Rocephin /flagyl         [name], MD [facility] practice Office: [phone]        Chief Complaint: Diverticulitis, colovesical fistula  Subjective: Patient in chair, tolerating soft diet.  Some pain.  Objective: Vital signs in last 24 hours: Temp:  [97.6 F (36.4 C)-97.9 F (36.6 C)] 97.9 F (36.6 C) (11/24 0434) Pulse Rate:  [88-103] 94 (11/24 0434) Resp:  [16] 16 (11/24 0434) BP: (108-131)/(62-79) 108/62 (11/24 0434) SpO2:  [99 %-100 %] 100 % (11/24 0434) Last BM Date : 09/09/24  Intake/Output from previous day: 11/23 0701 - 11/24 0700 In: 480 [P.O.:480] Out: 1425 [Urine:1425] Intake/Output this shift: No intake/output data recorded.  Physical Exam: HEENT - sclerae clear, mucous membranes moist Abdomen - soft without distension; mild tenderness LLQ and suprapubic; no guarding  Lab Results:  Recent Labs    09/08/24 1130 09/09/24 0413  WBC 12.9* 9.7  HGB 10.3* 9.5*  HCT 33.1* 30.4*  PLT 464* 405*   BMET No results for input(s): NA, K, CL, CO2, GLUCOSE, BUN, CREATININE, CALCIUM  in the last 72 hours. PT/INR No results for input(s): LABPROT, INR in the last 72 hours. Comprehensive Metabolic Panel:    Component Value Date/Time   NA 138 09/06/2024 0427   NA 138 09/05/2024 0503   K 3.7 09/06/2024 0427   K 3.5 09/05/2024 0503   CL 100 09/06/2024 0427   CL 98  09/05/2024 0503   CO2 34 (H) 09/06/2024 0427   CO2 33 (H) 09/05/2024 0503   BUN 7 (L) 09/06/2024 0427   BUN <5 (L) 09/05/2024 0503   CREATININE 0.46 09/06/2024 0427   CREATININE 0.55 09/05/2024 0503   GLUCOSE 92 09/06/2024 0427   GLUCOSE 97 09/05/2024 0503   CALCIUM  8.5 (L) 09/06/2024 0427   CALCIUM  9.0 09/05/2024 0503   AST 11 (L) 09/05/2024 0503   AST 16 09/04/2024 0434   ALT 6 09/05/2024 0503   ALT 7 09/04/2024 0434   ALKPHOS 85 09/05/2024 0503   ALKPHOS 89 09/04/2024 0434   BILITOT 0.3 09/05/2024 0503   BILITOT 0.2 09/04/2024 0434   PROT 5.9 (L) 09/05/2024 0503   PROT 5.9 (L) 09/04/2024 0434   ALBUMIN 3.3 (L) 09/05/2024 0503   ALBUMIN 3.3 (L) 09/04/2024 0434    Studies/Results: CT ABDOMEN PELVIS W CONTRAST Result Date: 09/08/2024 CLINICAL DATA:  Acute abdominal pain.  Bloating and gas. EXAM: CT ABDOMEN AND PELVIS WITH CONTRAST TECHNIQUE: Multidetector CT imaging of the abdomen and pelvis was performed using the standard protocol following bolus administration of intravenous contrast. RADIATION DOSE REDUCTION: This exam was performed according to the departmental dose-optimization program which includes automated exposure control, adjustment of the mA and/or kV according to patient size and/or use of iterative reconstruction technique. CONTRAST:  OMNIPAQUE  IOHEXOL  300 MG/ML  SOLN COMPARISON:  09/02/2024. FINDINGS: Lower chest: The heart is normal in size and scattered coronary artery calcifications are noted. Atelectasis is present at the  lung bases. Hepatobiliary: No focal liver abnormality is seen. Fatty infiltration of the liver is noted. Status post cholecystectomy. No biliary dilatation. Pancreas: Unremarkable. No pancreatic ductal dilatation or surrounding inflammatory changes. Spleen: Normal in size without focal abnormality. Adrenals/Urinary Tract: The adrenal glands are within normal limits. No renal calculus bilaterally. There is mild hydroureteronephrosis on the left  of the collecting system to the level of inflammatory changes in the left lower quadrant. Urothelial enhancement is noted in the mid to distal ureter. No obstructive uropathy is seen. A Foley catheter is present in the urinary bladder in the bladder is decompressed. Soft tissue thickening with enhancement is noted along the superior left aspect of the urinary bladder in the region of inflammatory changes. Air is identified in the urinary bladder, possibly related to fistula or Foley catheter placement. Stomach/Bowel: The stomach is within normal limits. There is no bowel obstruction. Colonic diverticulosis is again seen with bowel wall thickening and surrounding inflammatory changes in the left lower quadrant. Foci of contained air and soft tissue density are noted in the left lower quadrant adjacent to the sigmoid colon forming a tract to the urinary bladder and ovary on the left, not significantly changed from the prior exam. Appendix appears normal. Vascular/Lymphatic: Aortic atherosclerosis. No enlarged abdominal or pelvic lymph nodes. Reproductive: Status post hysterectomy. No adnexal masses. Other: There is a trace amount of free fluid in the left pericolic gutter. A small fat containing umbilical hernia is noted. Musculoskeletal: Degenerative changes are present in the thoracolumbar spine. No acute osseous abnormality. IMPRESSION: 1. Acute sigmoid diverticulitis with contained collection of air with associated soft tissue density in the left lower quadrant extending to the urinary bladder and ovary on the left, concerning for fistula formation. Enteric contrast has not reached this region of the sigmoid colon at the time of this scan and delayed imaging should be considered once contrast has progressed through the colon to assess for fistula. 2. Mild hydroureteronephrosis on the left to the level of inflammatory changes in the left lower quadrant, likely related to local inflammatory changes. Correlation with  urinalysis is suggested to exclude superimposed infection. 3. Hepatic steatosis. 4. Aortic atherosclerosis and coronary artery calcifications. Electronically Signed   By: Leita Birmingham M.D.   On: 09/08/2024 14:20      [name] 09/09/2024  Patient ID: Lynn Kline, female   DOB: 29-Oct-1955, 68 y.o.   MRN: [id number]

## 2024-09-09 NOTE — Progress Notes (Signed)
 [facility]  Called radiology and discussed repeat CT at this point given that they recommended repeat imaging to further assess fistula.  They tell me now 24 hours later that this is too late in the contrast will have already move through her.  If better characterization is needed of this in the future will need to consider delayed imaging.  Delon Failing, PA-C

## 2024-09-09 NOTE — Progress Notes (Signed)
 Progress Note   Subjective  Chief Complaint: Left lower quadrant pain and constipation for 2 weeks with a CT showing sigmoid diverticulitis complicated by fistulous connection to the bladder with secondary cystitis  Today, patient tells me that she is slowly getting better but does not want to be kicked out of the hospital.  Still feels very weak.  She has been trying to get up and out of the bed with PT.  Started with diarrhea overnight which she thinks is because of the antibiotics.  A little disheartened by this that she just started having soft solid stools.   Objective   Vital signs in last 24 hours: Temp:  [97.6 F (36.4 C)-97.9 F (36.6 C)] 97.9 F (36.6 C) (11/24 0434) Pulse Rate:  [88-103] 94 (11/24 0434) Resp:  [16] 16 (11/24 0434) BP: (108-131)/(62-79) 108/62 (11/24 0434) SpO2:  [99 %-100 %] 99 % (11/24 0844) Last BM Date : 09/09/24 General:    elderly white female in NAD Heart:  Regular rate and rhythm; no murmurs Lungs: Respirations even and unlabored, lungs CTA bilaterally Abdomen:  Soft, moderate LLQ ttp and nondistended. Normal bowel sounds. Psych:  Cooperative. Normal mood and affect.  Intake/Output from previous day: 11/23 0701 - 11/24 0700 In: 480 [P.O.:480] Out: 1425 [Urine:1425] Intake/Output this shift: Total I/O In: 10 [I.V.:10] Out: -   Lab Results: Recent Labs    09/07/24 0454 09/08/24 1130 09/09/24 0413  WBC 12.5* 12.9* 9.7  HGB 9.4* 10.3* 9.5*  HCT 30.2* 33.1* 30.4*  PLT 382 464* 405*   Studies/Results: CT ABDOMEN PELVIS W CONTRAST Result Date: 09/08/2024 CLINICAL DATA:  Acute abdominal pain.  Bloating and gas. EXAM: CT ABDOMEN AND PELVIS WITH CONTRAST TECHNIQUE: Multidetector CT imaging of the abdomen and pelvis was performed using the standard protocol following bolus administration of intravenous contrast. RADIATION DOSE REDUCTION: This exam was performed according to the departmental dose-optimization program which includes  automated exposure control, adjustment of the mA and/or kV according to patient size and/or use of iterative reconstruction technique. CONTRAST:  OMNIPAQUE  IOHEXOL  300 MG/ML  SOLN COMPARISON:  09/02/2024. FINDINGS: Lower chest: The heart is normal in size and scattered coronary artery calcifications are noted. Atelectasis is present at the lung bases. Hepatobiliary: No focal liver abnormality is seen. Fatty infiltration of the liver is noted. Status post cholecystectomy. No biliary dilatation. Pancreas: Unremarkable. No pancreatic ductal dilatation or surrounding inflammatory changes. Spleen: Normal in size without focal abnormality. Adrenals/Urinary Tract: The adrenal glands are within normal limits. No renal calculus bilaterally. There is mild hydroureteronephrosis on the left of the collecting system to the level of inflammatory changes in the left lower quadrant. Urothelial enhancement is noted in the mid to distal ureter. No obstructive uropathy is seen. A Foley catheter is present in the urinary bladder in the bladder is decompressed. Soft tissue thickening with enhancement is noted along the superior left aspect of the urinary bladder in the region of inflammatory changes. Air is identified in the urinary bladder, possibly related to fistula or Foley catheter placement. Stomach/Bowel: The stomach is within normal limits. There is no bowel obstruction. Colonic diverticulosis is again seen with bowel wall thickening and surrounding inflammatory changes in the left lower quadrant. Foci of contained air and soft tissue density are noted in the left lower quadrant adjacent to the sigmoid colon forming a tract to the urinary bladder and ovary on the left, not significantly changed from the prior exam. Appendix appears normal. Vascular/Lymphatic: Aortic atherosclerosis. No enlarged  abdominal or pelvic lymph nodes. Reproductive: Status post hysterectomy. No adnexal masses. Other: There is a trace amount of free  fluid in the left pericolic gutter. A small fat containing umbilical hernia is noted. Musculoskeletal: Degenerative changes are present in the thoracolumbar spine. No acute osseous abnormality. IMPRESSION: 1. Acute sigmoid diverticulitis with contained collection of air with associated soft tissue density in the left lower quadrant extending to the urinary bladder and ovary on the left, concerning for fistula formation. Enteric contrast has not reached this region of the sigmoid colon at the time of this scan and delayed imaging should be considered once contrast has progressed through the colon to assess for fistula. 2. Mild hydroureteronephrosis on the left to the level of inflammatory changes in the left lower quadrant, likely related to local inflammatory changes. Correlation with urinalysis is suggested to exclude superimposed infection. 3. Hepatic steatosis. 4. Aortic atherosclerosis and coronary artery calcifications. Electronically Signed   By: Leita Birmingham M.D.   On: 09/08/2024 14:20    Assessment / Plan:   68 year old Caucasian female with urinary incontinence, left lower quadrant pain constipation for 2 weeks.  CTAP without contrast identified evidence of sigmoid diverticulitis complicated by fistulous communication to the bladder with secondary cystitis (colovesicular fistula).  Evaluated general surgery, no indication for urgent surgery.  On IV Ceftriaxone  Flagyl  with modest improvement.  Assessment: 1.  Perforated diverticulitis: With colovesicular fistula, on IV ceftriaxone  and Flagyl  with improvement 2.  Loose stools: Secondary to antibiotics 3.  Normocytic anemia: B12 deficiency, hemoglobin 9.5 today (9.3 on 11/23), no overt GI bleeding, continue to monitor 4.  COPD: On home oxygen  2 L nasal cannula 5.  Bilateral leg pain: With right foot pain and swelling, x-ray negative for acute fracture, Doppler study negative for VTE  Plan: 1.  Continue IV antibiotics. 2.  Will allow Dr.  Leigh time to review recent CT 3.  Continue other supportive measures.  We will likely sign off. 4.  Patient already scheduled for colonoscopy 11/12/2023 at the hospital given her O2 use with Dr. Stacia.  Thank you for your kind consultation.   LOS: 7 days   Delon Hendricks Failing  09/09/2024, 11:47 AM

## 2024-09-09 NOTE — Progress Notes (Signed)
 Mobility Specialist - Progress Note   09/09/24 1410  Mobility  Activity Ambulated with assistance  Level of Assistance Standby assist, set-up cues, supervision of patient - no hands on  Assistive Device Front wheel walker  Distance Ambulated (ft) 290 ft  Range of Motion/Exercises Active  Activity Response Tolerated well  Mobility visit 1 Mobility  Mobility Specialist Start Time (ACUTE ONLY) 1445  Mobility Specialist Stop Time (ACUTE ONLY) 1510  Mobility Specialist Time Calculation (min) (ACUTE ONLY) 25 min   Pt was found in bed and agreeable to mobilize. No complaints. At EOS returned to recliner chair with all needs met. Call bell in reach.   Erminio Leos,  Mobility Specialist Can be reached via Secure Chat

## 2024-09-09 NOTE — Progress Notes (Addendum)
 Attempted to call CT x3 to notify that patient drank contrast. Report given to night shift nurse/charge nurse.

## 2024-09-09 NOTE — Plan of Care (Signed)
   Problem: Education: Goal: Knowledge of General Education information will improve Description: Including pain rating scale, medication(s)/side effects and non-pharmacologic comfort measures Outcome: Progressing   Problem: Pain Managment: Goal: General experience of comfort will improve and/or be controlled Outcome: Progressing   Problem: Safety: Goal: Ability to remain free from injury will improve Outcome: Progressing

## 2024-09-09 NOTE — Progress Notes (Signed)
  Progress Note   Patient: Lynn Kline FMW:[id number] DOB: 10-Dec-1955 DOA: 09/02/2024     7 DOS: the patient was seen and examined on 09/09/2024   Brief hospital course: Patient is a 68 year old female with past medical history significant for anxiety, COPD, chronic hypoxic respiratory failure requiring 3L O2 at baseline, diverticulosis, and colon polyps.  Patient presented with urinary incontinence and left-sided abdominal pain.  Apparently, abdominal pain had worsened over 2 weeks preceding presentation.  Urinalysis was suggestive of UTI, however, urine culture grew multiple species.  CT abdomen and pelvis revealed sigmoid diverticulitis complicated by a fistulous communication to the bladder with secondary cystitis. There was also extraluminous gas and soft tissue traversing the left hemipelvis possibly involving the left ovary. Gastroenterology and urology have been consulted. The patient is receiving IV ceftriaxone  and IV flagyl .  Will need to complete 2 weeks of antibiotics.  Patient will follow-up with GI team on discharge.  GI team plans to proceed with colonoscopy in January 2026.  Colonoscopy is planned for on January 26.  CT scan abdomen and pelvis with oral and IV contrast done on 09/08/2024 is as documented below.  CT scan of pelvis without contrast is pending.  CT abdomen and pelvis with IV and oral contrast done on 09/08/2024 revealed:  1. Acute sigmoid diverticulitis with contained collection of air with associated soft tissue density in the left lower quadrant extending to the urinary bladder and ovary on the left, concerning for fistula formation. Enteric contrast has not reached this region of the sigmoid colon at the time of this scan and delayed imaging should be considered once contrast has progressed through the colon to assess for fistula. 2. Mild hydroureteronephrosis on the left to the level of inflammatory changes in the left lower quadrant, likely related to local  inflammatory changes. Correlation with urinalysis is suggested to exclude superimposed infection. 3. Hepatic steatosis. 4. Aortic atherosclerosis and coronary artery calcifications.  Assessment and Plan:  Acute sigmoid diverticulitis/ fistula with bladder/ possible free air: -General Surgery is directing care. - GI input is appreciated.  For colonoscopy in January 2026. - Completed 2 weeks of IV Flagyl  and ceftriaxone . - Follow CT pelvis without contrast ordered today. - Patient will be discharged, when cleared for discharge by the surgery and GI team.     Emphysema without exacerbation: -Stable. -Continue Breztri  and albuterol  nebulizer treatments   Anxiety: -Stable.     Hyperlipidemia: Crestor  is on hold.    Hypoglycemia Resolved.  Subjective:  No new complaints Continues to report vague abdominal pain.  Physical Exam: Vitals:   09/08/24 2004 09/09/24 0434 09/09/24 0844 09/09/24 1337  BP: 117/68 108/62  96/65  Pulse: (!) 103 94  96  Resp: 16 16  16   Temp: 97.9 F (36.6 C) 97.9 F (36.6 C)  98.4 F (36.9 C)  TempSrc:    Oral  SpO2: 99% 100% 99% 100%   Exam:  Constitutional:  The patient is awake and alert.  Patient is not in any distress.  Neck:  Supple.   Respiratory:  Clear to auscultation. Cardiovascular:  S1-S2. Abdomen:  Abdomen is soft.   Neurologic:  Patient is awake and alert.   Data Reviewed:  CBC, BMP  Family Communication:   Disposition: Status is: Inpatient Remains inpatient appropriate because: Need for subspecialty care and surgical intervention.  Planned Discharge Destination: tbd    Time spent: 35 minutes  Author: Leatrice LILLETTE Chapel, MD 09/09/2024 6:22 PM  For on call review [email or web address].

## 2024-09-10 DIAGNOSIS — K632 Fistula of intestine: Secondary | ICD-10-CM | POA: Diagnosis not present

## 2024-09-10 LAB — CBC WITH DIFFERENTIAL/PLATELET
Abs Immature Granulocytes: 0.02 K/uL (ref 0.00–0.07)
Basophils Absolute: 0 K/uL (ref 0.0–0.1)
Basophils Relative: 1 %
Eosinophils Absolute: 0.3 K/uL (ref 0.0–0.5)
Eosinophils Relative: 4 %
HCT: 29.9 % — ABNORMAL LOW (ref 36.0–46.0)
Hemoglobin: 9.3 g/dL — ABNORMAL LOW (ref 12.0–15.0)
Immature Granulocytes: 0 %
Lymphocytes Relative: 22 %
Lymphs Abs: 1.9 K/uL (ref 0.7–4.0)
MCH: 28.6 pg (ref 26.0–34.0)
MCHC: 31.1 g/dL (ref 30.0–36.0)
MCV: 92 fL (ref 80.0–100.0)
Monocytes Absolute: 0.8 K/uL (ref 0.1–1.0)
Monocytes Relative: 9 %
Neutro Abs: 5.5 K/uL (ref 1.7–7.7)
Neutrophils Relative %: 64 %
Platelets: 444 K/uL — ABNORMAL HIGH (ref 150–400)
RBC: 3.25 MIL/uL — ABNORMAL LOW (ref 3.87–5.11)
RDW: 13.6 % (ref 11.5–15.5)
WBC: 8.6 K/uL (ref 4.0–10.5)
nRBC: 0 % (ref 0.0–0.2)

## 2024-09-10 MED ORDER — FERROUS SULFATE 325 (65 FE) MG PO TABS
325.0000 mg | ORAL_TABLET | Freq: Every day | ORAL | Status: DC
  Administered 2024-09-11 – 2024-09-13 (×3): 325 mg via ORAL
  Filled 2024-09-10 (×3): qty 1

## 2024-09-10 NOTE — Progress Notes (Signed)
 Assessment & Plan: Sigmoid diverticulitis with colovesical fistula -WBC 8.6 this AM, improved -GI planning colonoscopy -await urology consultation -pt will need total of 2 weeks of abx therapy  -Will plan for her to follow up outpatient with colorectal specialist after her colonoscopy -Continue with IV antibiotics - Rocephin /flagyl   Repeat CT 11/24 findings: 1. Active sigmoid diverticulitis, minimally improved, with associated soft tissue thickening extending to the left bladder dome consistent with a fistula; no contrast identified within the fistula on this exam, with better demonstration on the prior contrast-enhanced study. 2. Cystitis with trace intravesical air. 3. Mild left ureteral distention extending down to the  inflammatory changes. 4. Inflammatory changes surrounding the left ovary without evidence of tubo-ovarian abscess. 5. Trace pelvic ascites.        [name], MD [facility] practice Office: [phone]        Chief Complaint: Acute diverticulitis with colovesical fistula  Subjective: Patient up in chair, loose BM's this AM.  Eating regular breakfast.  Objective: Vital signs in last 24 hours: Temp:  [97.8 F (36.6 C)-98.4 F (36.9 C)] 98.2 F (36.8 C) (11/25 0529) Pulse Rate:  [91-97] 92 (11/25 0529) Resp:  [15-18] 16 (11/25 0529) BP: (96-130)/(63-78) 122/63 (11/25 0529) SpO2:  [98 %-100 %] 98 % (11/25 0529) FiO2 (%):  [28 %] 28 % (11/24 1959) Last BM Date : 09/09/24  Intake/Output from previous day: 11/24 0701 - 11/25 0700 In: 250 [P.O.:240; I.V.:10] Out: 2075 [Urine:2075] Intake/Output this shift: No intake/output data recorded.  Physical Exam: HEENT - sclerae clear, mucous membranes moist Abdomen - soft, mild to moderate tenderness LLQ  Lab Results:  Recent Labs    09/09/24 0413 09/10/24 0423  WBC 9.7 8.6  HGB 9.5* 9.3*  HCT 30.4* 29.9*  PLT 405* 444*   BMET No results for input(s): NA, K,  CL, CO2, GLUCOSE, BUN, CREATININE, CALCIUM  in the last 72 hours. PT/INR No results for input(s): LABPROT, INR in the last 72 hours. Comprehensive Metabolic Panel:    Component Value Date/Time   NA 138 09/06/2024 0427   NA 138 09/05/2024 0503   K 3.7 09/06/2024 0427   K 3.5 09/05/2024 0503   CL 100 09/06/2024 0427   CL 98 09/05/2024 0503   CO2 34 (H) 09/06/2024 0427   CO2 33 (H) 09/05/2024 0503   BUN 7 (L) 09/06/2024 0427   BUN <5 (L) 09/05/2024 0503   CREATININE 0.46 09/06/2024 0427   CREATININE 0.55 09/05/2024 0503   GLUCOSE 92 09/06/2024 0427   GLUCOSE 97 09/05/2024 0503   CALCIUM  8.5 (L) 09/06/2024 0427   CALCIUM  9.0 09/05/2024 0503   AST 11 (L) 09/05/2024 0503   AST 16 09/04/2024 0434   ALT 6 09/05/2024 0503   ALT 7 09/04/2024 0434   ALKPHOS 85 09/05/2024 0503   ALKPHOS 89 09/04/2024 0434   BILITOT 0.3 09/05/2024 0503   BILITOT 0.2 09/04/2024 0434   PROT 5.9 (L) 09/05/2024 0503   PROT 5.9 (L) 09/04/2024 0434   ALBUMIN 3.3 (L) 09/05/2024 0503   ALBUMIN 3.3 (L) 09/04/2024 0434    Studies/Results: CT PELVIS WO CONTRAST Result Date: 09/09/2024 EXAM: CT PELVIS, WITHOUT IV CONTRAST 09/09/2024 10:49:00 PM TECHNIQUE: Axial images were acquired through the pelvis without IV contrast. Reformatted images were reviewed. Automated exposure control, iterative reconstruction, and/or weight based adjustment of the mA/kV was utilized to reduce the radiation dose to as low as reasonably achievable. COMPARISON: CT abdomen and pelvis with IV and oral contrast  yesterday, and CT abdomen and pelvis without contrast on 09/02/2024. CLINICAL HISTORY: Abdominal pain, acute (Ped 0-17y). FINDINGS: BONES: No acute fracture or focal osseous lesion. JOINTS: No dislocation. The joint spaces are normal. SOFT TISSUES: Mild body wall edema in the upper thighs and hips. INTRAPELVIC CONTENTS: There is contrast throughout the visualized colon, which may have been instilled retrograde per rectum.  The visualized small bowel is normal in caliber. The appendix is normal. The visualized colon wall is unremarkable until the distal descending segment and sigmoid, which show advanced diverticulosis. Changes of active sigmoid diverticulitis involving the proximal to mid sigmoid segment are again noted with wall thickening and stranding, minimally improved. The underlying bladder is contracted around the foley catheter with wall thickening and stranding consistent with cystitis. There is trace air in the bladder. Soft tissue thickening extending from the dorsal surface of the diseased sigmoid colon again extends to the left side of the bladder dome; on yesterday's study, this contained air locules within the soft tissue thickening consistent with a fistula. There is no contrast in the fistula at this time. No enteric contrast is seen in the bladder. These findings were actually better demonstrated on yesterday's study with contrast. There is a mild left ureteral distention extending down to the inflammatory changes. The inflammatory process extends around the left ovary, but there is no evidence of a tubo-ovarian abscess. There is trace ascites in the posterior deep pelvis. There is no free air or localizing fluid collection. No adenopathy or soft tissue mass and no incarcerated hernia. VASCULATURE: There is moderate aortoiliac calcific plaque. No aneurysm is seen. IMPRESSION: 1. Active sigmoid diverticulitis, minimally improved, with associated soft tissue thickening extending to the left bladder dome consistent with a fistula; no contrast identified within the fistula on this exam, with better demonstration on the prior contrast-enhanced study. 2. Cystitis with trace intravesical air. 3. Mild left ureteral distention extending down to the  inflammatory changes. 4. Inflammatory changes surrounding the left ovary without evidence of tubo-ovarian abscess. 5. Trace pelvic ascites. Electronically signed by: Francis Quam MD 09/09/2024 11:21 PM EST RP Workstation: [id number]   CT ABDOMEN PELVIS W CONTRAST Result Date: 09/08/2024 CLINICAL DATA:  Acute abdominal pain.  Bloating and gas. EXAM: CT ABDOMEN AND PELVIS WITH CONTRAST TECHNIQUE: Multidetector CT imaging of the abdomen and pelvis was performed using the standard protocol following bolus administration of intravenous contrast. RADIATION DOSE REDUCTION: This exam was performed according to the departmental dose-optimization program which includes automated exposure control, adjustment of the mA and/or kV according to patient size and/or use of iterative reconstruction technique. CONTRAST:  OMNIPAQUE  IOHEXOL  300 MG/ML  SOLN COMPARISON:  09/02/2024. FINDINGS: Lower chest: The heart is normal in size and scattered coronary artery calcifications are noted. Atelectasis is present at the lung bases. Hepatobiliary: No focal liver abnormality is seen. Fatty infiltration of the liver is noted. Status post cholecystectomy. No biliary dilatation. Pancreas: Unremarkable. No pancreatic ductal dilatation or surrounding inflammatory changes. Spleen: Normal in size without focal abnormality. Adrenals/Urinary Tract: The adrenal glands are within normal limits. No renal calculus bilaterally. There is mild hydroureteronephrosis on the left of the collecting system to the level of inflammatory changes in the left lower quadrant. Urothelial enhancement is noted in the mid to distal ureter. No obstructive uropathy is seen. A Foley catheter is present in the urinary bladder in the bladder is decompressed. Soft tissue thickening with enhancement is noted along the superior left aspect of the urinary bladder in the region  of inflammatory changes. Air is identified in the urinary bladder, possibly related to fistula or Foley catheter placement. Stomach/Bowel: The stomach is within normal limits. There is no bowel obstruction. Colonic diverticulosis is again seen with bowel wall  thickening and surrounding inflammatory changes in the left lower quadrant. Foci of contained air and soft tissue density are noted in the left lower quadrant adjacent to the sigmoid colon forming a tract to the urinary bladder and ovary on the left, not significantly changed from the prior exam. Appendix appears normal. Vascular/Lymphatic: Aortic atherosclerosis. No enlarged abdominal or pelvic lymph nodes. Reproductive: Status post hysterectomy. No adnexal masses. Other: There is a trace amount of free fluid in the left pericolic gutter. A small fat containing umbilical hernia is noted. Musculoskeletal: Degenerative changes are present in the thoracolumbar spine. No acute osseous abnormality. IMPRESSION: 1. Acute sigmoid diverticulitis with contained collection of air with associated soft tissue density in the left lower quadrant extending to the urinary bladder and ovary on the left, concerning for fistula formation. Enteric contrast has not reached this region of the sigmoid colon at the time of this scan and delayed imaging should be considered once contrast has progressed through the colon to assess for fistula. 2. Mild hydroureteronephrosis on the left to the level of inflammatory changes in the left lower quadrant, likely related to local inflammatory changes. Correlation with urinalysis is suggested to exclude superimposed infection. 3. Hepatic steatosis. 4. Aortic atherosclerosis and coronary artery calcifications. Electronically Signed   By: Leita Birmingham M.D.   On: 09/08/2024 14:20      [name] 09/10/2024  Patient ID: Lynn Kline, female   DOB: Nov 14, 1955, 68 y.o.   MRN: [id number]

## 2024-09-10 NOTE — Consult Note (Signed)
 H&P Physician requesting consult: Leatrice Chapel  Chief Complaint: Colovesical fistula  History of Present Illness: 68 year old female currently admitted with acute diverticulitis.  There was trace air in the bladder raising concern for possible colovesical fistula.  Planning for outpatient evaluation.  She does report passing some air in the urine but no particulate matter for the past couple of weeks.  She also developed urinary incontinence around that time.  She denies recurrent urinary tract infections.  This is her first time having any issues.  Past Medical History:  Diagnosis Date   Anxiety    Atherosclerosis    Colon polyp, hyperplastic    COPD (chronic obstructive pulmonary disease) (HCC)    Diverticulosis    Gallstone    Pneumonia    Past Surgical History:  Procedure Laterality Date   ABDOMINAL HYSTERECTOMY     BRONCHIAL BIOPSY  11/03/2020   Procedure: BRONCHIAL BIOPSIES;  Surgeon: Brenna Adine CROME, DO;  Location: [facility];  Service: Pulmonary;;   BRONCHIAL BRUSHINGS  11/03/2020   Procedure: BRONCHIAL BRUSHINGS;  Surgeon: Brenna Adine CROME, DO;  Location: [facility];  Service: Pulmonary;;   BRONCHIAL NEEDLE ASPIRATION BIOPSY  11/03/2020   Procedure: BRONCHIAL NEEDLE ASPIRATION BIOPSIES;  Surgeon: Brenna Adine CROME, DO;  Location: [facility];  Service: Pulmonary;;   BRONCHIAL WASHINGS  11/03/2020   Procedure: BRONCHIAL WASHINGS;  Surgeon: Brenna Adine CROME, DO;  Location: [facility];  Service: Pulmonary;;   CESAREAN SECTION     CHOLECYSTECTOMY     COLONOSCOPY     FIDUCIAL MARKER PLACEMENT  11/03/2020   Procedure: FIDUCIAL MARKER PLACEMENT;  Surgeon: Brenna Adine CROME, DO;  Location: [facility];  Service: Pulmonary;;   VIDEO BRONCHOSCOPY WITH ENDOBRONCHIAL NAVIGATION N/A 11/03/2020   Procedure: VIDEO BRONCHOSCOPY WITH ENDOBRONCHIAL NAVIGATION AND POSSIBLE ULTRASOUND;  Surgeon: Brenna Adine CROME, DO;  Location: [facility];  Service: Pulmonary;  Laterality: N/A;   VIDEO  BRONCHOSCOPY WITH ENDOBRONCHIAL ULTRASOUND  11/03/2020   Procedure: VIDEO BRONCHOSCOPY WITH ENDOBRONCHIAL ULTRASOUND;  Surgeon: Brenna Adine CROME, DO;  Location: [facility];  Service: Pulmonary;;    Home Medications:  Medications Prior to Admission  Medication Sig Dispense Refill Last Dose/Taking   albuterol  (PROVENTIL ) (2.5 MG/3ML) 0.083% nebulizer solution USE 3 ML VIA NEBULIZER EVERY 4 HOURS AS NEEDED FOR WHEEZING OR SHORTNESS OF BREATH (Patient taking differently: Take 2.5 mg by nebulization every 4 (four) hours as needed for wheezing or shortness of breath.) 75 mL 0 Unknown   ALPRAZolam  (XANAX ) 0.25 MG tablet TAKE 1 TABLET(0.25 MG) BY MOUTH TWICE DAILY AS NEEDED (Patient taking differently: Take 0.25 mg by mouth See admin instructions.  Take 0.25 mg by mouth in the morning and an additional 0.25 mg once a day as needed for anxiety) 60 tablet 1 08/31/2024   budesonide -glycopyrrolate -formoterol  (BREZTRI  AEROSPHERE) 160-9-4.8 MCG/ACT AERO inhaler Inhale 2 puffs into the lungs in the morning and at bedtime. 3 each 3 09/02/2024 Morning   cholecalciferol (VITAMIN D3) 25 MCG (1000 UNIT) tablet Take 1,000 Units by mouth daily.   Past Week   cyanocobalamin  2000 MCG tablet Take 1 tablet (2,000 mcg total) by mouth daily. 90 tablet 0 Past Week   naproxen sodium (ALEVE) 220 MG tablet Take 220 mg by mouth 2 (two) times daily as needed (for mild pain or headaches).   Unknown   OXYGEN  Inhale 3 L/min into the lungs continuous.   09/02/2024 Morning   rosuvastatin  (CRESTOR ) 10 MG tablet Take 1 tablet (10 mg total) by mouth daily. (Patient taking differently: Take 10  mg by mouth at bedtime.) 90 tablet 1 Past Week   VENTOLIN  HFA 108 (90 Base) MCG/ACT inhaler INHALE 1 TO 2 PUFFS INTO THE LUNGS EVERY 6 HOURS AS NEEDED FOR WHEEZING OR SHORTNESS OF BREATH 18 g 3 Past Week   Allergies:  Allergies  Allergen Reactions   Codeine Nausea And Vomiting   Erythromycin Nausea And Vomiting   Ampicillin Nausea And Vomiting,  Rash and Other (See Comments)    Pt states that rash was on her tongue.  Has patient had a PCN reaction causing immediate rash, facial/tongue/throat swelling, SOB or lightheadedness with hypotension yes Has patient had a PCN reaction causing severe rash involving mucus membranes or skin necrosis: no Has patient had a PCN reaction that required hospitalization yes Has patient had a PCN reaction occurring within the last 10 years: no If all of the above answers are NO, then may proceed with Cephalosporin use.     Family History  Problem Relation Age of Onset   Arthritis Mother    COPD Mother    Emphysema Mother    Stroke Father    Heart disease Father    Cancer Father        MELANOMA   Breast cancer Maternal Grandmother    Colon cancer Neg Hx    Social History:  reports that she quit smoking about 2 years ago. Her smoking use included cigarettes. She has been exposed to tobacco smoke. She has never used smokeless tobacco. She reports that she does not drink alcohol and does not use drugs.  ROS: A complete review of systems was performed.  All systems are negative except for pertinent findings as noted. ROS   Physical Exam:  Vital signs in last 24 hours: Temp:  [97.8 F (36.6 C)-98.2 F (36.8 C)] 98.2 F (36.8 C) (11/25 1332) Pulse Rate:  [91-97] 92 (11/25 1332) Resp:  [15-24] 24 (11/25 1332) BP: (109-130)/(63-78) 109/75 (11/25 1332) SpO2:  [96 %-100 %] 100 % (11/25 1332) FiO2 (%):  [28 %] 28 % (11/24 1959) General:  Alert and oriented, No acute distress HEENT: Normocephalic, atraumatic Neck: No JVD or lymphadenopathy Cardiovascular: Regular rate and rhythm Lungs: Regular rate and effort Abdomen: Soft, nontender, nondistended, no abdominal masses Genitourinary: Foley catheter draining clear yellow urine Back: No CVA tenderness Extremities: No edema Neurologic: Grossly intact  Laboratory Data:  Results for orders placed or performed during the hospital encounter of  09/02/24 (from the past 24 hours)  CBC with Differential/Platelet     Status: Abnormal   Collection Time: 09/10/24  4:23 AM  Result Value Ref Range   WBC 8.6 4.0 - 10.5 K/uL   RBC 3.25 (L) 3.87 - 5.11 MIL/uL   Hemoglobin 9.3 (L) 12.0 - 15.0 g/dL   HCT 70.0 (L) 63.9 - 53.9 %   MCV 92.0 80.0 - 100.0 fL   MCH 28.6 26.0 - 34.0 pg   MCHC 31.1 30.0 - 36.0 g/dL   RDW 86.3 88.4 - 84.4 %   Platelets 444 (H) 150 - 400 K/uL   nRBC 0.0 0.0 - 0.2 %   Neutrophils Relative % 64 %   Neutro Abs 5.5 1.7 - 7.7 K/uL   Lymphocytes Relative 22 %   Lymphs Abs 1.9 0.7 - 4.0 K/uL   Monocytes Relative 9 %   Monocytes Absolute 0.8 0.1 - 1.0 K/uL   Eosinophils Relative 4 %   Eosinophils Absolute 0.3 0.0 - 0.5 K/uL   Basophils Relative 1 %   Basophils Absolute 0.0  0.0 - 0.1 K/uL   Immature Granulocytes 0 %   Abs Immature Granulocytes 0.02 0.00 - 0.07 K/uL   Recent Results (from the past 240 hours)  Urine Culture     Status: Abnormal   Collection Time: 09/02/24 11:03 AM   Specimen: Urine, Random  Result Value Ref Range Status   Specimen Description   Final    URINE, RANDOM Performed at [facility], [address] [place], [place] [address]    Special Requests   Final    NONE Reflexed from [id number] Performed at [facility], [address] [place], [place] [address]    Culture MULTIPLE SPECIES PRESENT, SUGGEST RECOLLECTION (A)  Final   Report Status 09/03/2024 FINAL  Final   Creatinine: Recent Labs    09/04/24 0434 09/05/24 0503 09/06/24 0427  CREATININE 0.54 0.55 0.46    Impression/Assessment:  Diverticulitis Possible colovesical fistula Left hydronephrosis  Plan:  Agree with outpatient general surgery evaluation.  Urology will be available if needed for cystoscopy with firefly.  Anticipate hydronephrosis will resolve with treatment of the diverticulitis.  Can get follow-up renal ultrasound after resolution of the infection, in a couple weeks or so.   Can consider cystoscopy outpatient as well.  Okay to remove Foley from my standpoint  [place]wood JONETTA Edison, III 09/10/2024, 5:41 PM

## 2024-09-10 NOTE — Progress Notes (Signed)
 PROGRESS NOTE  JORDANE HISLE  FMW:[id number] DOB: Dec 31, 1955 DOA: 09/02/2024 PCP: [name], MD   Brief Narrative: Patient is a 68 year old female with history of anxiety, COPD, chronic hypoxic respiratory failure requiring 3 of oxygen  at baseline, diverticulosis, colon polyps who presented with urinary incontinence, left-sided abdominal pain.  UA was suspicious for UTI.  Urine culture grew multiple species.  Abdomen/pelvis CT showed sigmoid diverticulitis complicated by a fistulous communication to the bladder with secondary cystitis.  Consulted GI, plan for colonoscopy as an outpatient.  General surgery following. Plan to  consult urology as well .currently on ceftriaxone , Flagyl , plan for 2 weeks of antibiotics course.  Assessment & Plan:  Principal Problem:   Colonic fistula Active Problems:   Diverticulitis  Sigmoid diverticulitis/colovesical fistula: Presented with abdominal pain, urinary in continence. UA was suspicious for UTI.  Urine culture grew multiple species.  Abdomen/pelvis CT on 11/23 showed sigmoid diverticulitis complicated by a fistulous communication to the bladder with secondary cystitis. CT pelvis without contrast on 11/24 showed active sigmoid diverticulitis, minimally improved, with associated soft tissue thickening extending to the left bladder dome consistent with a fistula,Cystitis with trace intravesical air,mild left ureteral distention extending down to the  inflammatory changes,Inflammatory changes surrounding the left ovary without evidence of tubo-ovarian abscess. General surgery recommended to obtain urology consultation. I sent message to Dr Carolee. Plan to continue 2 weeks of antibiotic therapy.  Currently on Rocephin , Flagyl .  General surgery plan to follow-up as an outpatient with colorectal specialist after colonoscopy. Currently on soft diet  Colovesical fistula: GI planning to perform colonoscopy in January to reevaluate prior to consideration of  surgery.   Chronic hypoxic respiratory failure/COPD: Continue bronchodilators as needed.  On 3 L of oxygen  at home at baseline.  Hyperlipidemia: Takes Crestor  at home  Iron deficiency anemia: Current hemoglobin in the range of 9.  Iron studies showed low iron.  Continue iron supplementation  Deconditioning: Patient lives with her dog.  Ambulatory at baseline.  Will consult PT       DVT prophylaxis:enoxaparin  (LOVENOX ) injection 40 mg Start: 09/08/24 1000 SCDs Start: 09/02/24 1946     Code Status: Full Code  Family Communication: None at bedside  Patient status:Inpatient  Patient is from :home  Anticipated discharge un:ynfz  Estimated DC date:after full work up,after general surgery clearane   Consultants: General surgery, GI  Procedures: None yet  Antimicrobials:  Anti-infectives (From admission, onward)    Start     Dose/Rate Route Frequency Ordered Stop   09/03/24 1600  cefTRIAXone  (ROCEPHIN ) 2 g in sodium chloride  0.9 % 100 mL IVPB        2 g 200 mL/hr over 30 Minutes Intravenous Every 24 hours 09/02/24 1944     09/03/24 0800  metroNIDAZOLE  (FLAGYL ) IVPB 500 mg        500 mg 100 mL/hr over 60 Minutes Intravenous Every 12 hours 09/02/24 1944     09/02/24 1715  metroNIDAZOLE  (FLAGYL ) IVPB 500 mg        500 mg 100 mL/hr over 60 Minutes Intravenous  Once 09/02/24 1710 09/02/24 1950   09/02/24 1530  cefTRIAXone  (ROCEPHIN ) 1 g in sodium chloride  0.9 % 100 mL IVPB        1 g 200 mL/hr over 30 Minutes Intravenous  Once 09/02/24 1529 09/02/24 1659       Subjective: Patient seen and examined at bedside today.  Hemodynamically stable.  Had 3 loose bowel movements today.  Tolerating soft diet.  No nausea  or vomiting.  Objective: Vitals:   09/09/24 1959 09/09/24 2001 09/10/24 0529 09/10/24 0905  BP:   122/63   Pulse:  97 92   Resp:  18 16   Temp:   98.2 F (36.8 C)   TempSrc:   Oral   SpO2: 99%  98% 96%    Intake/Output Summary (Last 24 hours) at 09/10/2024  1123 Last data filed at 09/10/2024 0500 Gross per 24 hour  Intake 240 ml  Output 2075 ml  Net -1835 ml   There were no vitals filed for this visit.  Examination:  General exam: Overall comfortable, not in distress, pleasant female HEENT: PERRL Respiratory system:  no wheezes or crackles  Cardiovascular system: S1 & S2 heard, RRR.  Gastrointestinal system: Abdomen is nondistended, soft , bowel sounds present.  Tenderness on the left lower quadrant Central nervous system: Alert and oriented Extremities: No edema, no clubbing ,no cyanosis Skin: No rashes, no ulcers,no icterus     Data Reviewed: I have personally reviewed following labs and imaging studies  CBC: Recent Labs  Lab 09/05/24 0503 09/06/24 0427 09/07/24 0454 09/08/24 1130 09/09/24 0413 09/10/24 0423  WBC 11.8* 11.3* 12.5* 12.9* 9.7 8.6  NEUTROABS 8.3* 7.9*  --  9.9* 6.6 5.5  HGB 10.6* 9.7* 9.4* 10.3* 9.5* 9.3*  HCT 33.4* 30.7* 30.2* 33.1* 30.4* 29.9*  MCV 91.5 91.9 93.2 93.8 93.0 92.0  PLT 434* 377 382 464* 405* 444*   Basic Metabolic Panel: Recent Labs  Lab 09/04/24 0434 09/05/24 0503 09/06/24 0427  NA 140 138 138  K 3.7 3.5 3.7  CL 101 98 100  CO2 36* 33* 34*  GLUCOSE 126* 97 92  BUN 6* <5* 7*  CREATININE 0.54 0.55 0.46  CALCIUM  8.6* 9.0 8.5*     Recent Results (from the past 240 hours)  Urine Culture     Status: Abnormal   Collection Time: 09/02/24 11:03 AM   Specimen: Urine, Random  Result Value Ref Range Status   Specimen Description   Final    URINE, RANDOM Performed at [facility], [address], [place], [place] [address]    Special Requests   Final    NONE Reflexed from [id number] Performed at [facility], [address], [place], [place] [address]    Culture MULTIPLE SPECIES PRESENT, SUGGEST RECOLLECTION (A)  Final   Report Status 09/03/2024 FINAL  Final     Radiology Studies: CT PELVIS WO CONTRAST Result Date: 09/09/2024 EXAM: CT  PELVIS, WITHOUT IV CONTRAST 09/09/2024 10:49:00 PM TECHNIQUE: Axial images were acquired through the pelvis without IV contrast. Reformatted images were reviewed. Automated exposure control, iterative reconstruction, and/or weight based adjustment of the mA/kV was utilized to reduce the radiation dose to as low as reasonably achievable. COMPARISON: CT abdomen and pelvis with IV and oral contrast yesterday, and CT abdomen and pelvis without contrast on 09/02/2024. CLINICAL HISTORY: Abdominal pain, acute (Ped 0-17y). FINDINGS: BONES: No acute fracture or focal osseous lesion. JOINTS: No dislocation. The joint spaces are normal. SOFT TISSUES: Mild body wall edema in the upper thighs and hips. INTRAPELVIC CONTENTS: There is contrast throughout the visualized colon, which may have been instilled retrograde per rectum. The visualized small bowel is normal in caliber. The appendix is normal. The visualized colon wall is unremarkable until the distal descending segment and sigmoid, which show advanced diverticulosis. Changes of active sigmoid diverticulitis involving the proximal to mid sigmoid segment are again noted with wall thickening and stranding, minimally improved.  The underlying bladder is contracted around the foley catheter with wall thickening and stranding consistent with cystitis. There is trace air in the bladder. Soft tissue thickening extending from the dorsal surface of the diseased sigmoid colon again extends to the left side of the bladder dome; on yesterday's study, this contained air locules within the soft tissue thickening consistent with a fistula. There is no contrast in the fistula at this time. No enteric contrast is seen in the bladder. These findings were actually better demonstrated on yesterday's study with contrast. There is a mild left ureteral distention extending down to the inflammatory changes. The inflammatory process extends around the left ovary, but there is no evidence of a  tubo-ovarian abscess. There is trace ascites in the posterior deep pelvis. There is no free air or localizing fluid collection. No adenopathy or soft tissue mass and no incarcerated hernia. VASCULATURE: There is moderate aortoiliac calcific plaque. No aneurysm is seen. IMPRESSION: 1. Active sigmoid diverticulitis, minimally improved, with associated soft tissue thickening extending to the left bladder dome consistent with a fistula; no contrast identified within the fistula on this exam, with better demonstration on the prior contrast-enhanced study. 2. Cystitis with trace intravesical air. 3. Mild left ureteral distention extending down to the  inflammatory changes. 4. Inflammatory changes surrounding the left ovary without evidence of tubo-ovarian abscess. 5. Trace pelvic ascites. Electronically signed by: Francis Quam MD 09/09/2024 11:21 PM EST RP Workstation: [id number]   CT ABDOMEN PELVIS W CONTRAST Result Date: 09/08/2024 CLINICAL DATA:  Acute abdominal pain.  Bloating and gas. EXAM: CT ABDOMEN AND PELVIS WITH CONTRAST TECHNIQUE: Multidetector CT imaging of the abdomen and pelvis was performed using the standard protocol following bolus administration of intravenous contrast. RADIATION DOSE REDUCTION: This exam was performed according to the departmental dose-optimization program which includes automated exposure control, adjustment of the mA and/or kV according to patient size and/or use of iterative reconstruction technique. CONTRAST:  OMNIPAQUE  IOHEXOL  300 MG/ML  SOLN COMPARISON:  09/02/2024. FINDINGS: Lower chest: The heart is normal in size and scattered coronary artery calcifications are noted. Atelectasis is present at the lung bases. Hepatobiliary: No focal liver abnormality is seen. Fatty infiltration of the liver is noted. Status post cholecystectomy. No biliary dilatation. Pancreas: Unremarkable. No pancreatic ductal dilatation or surrounding inflammatory changes. Spleen: Normal in size  without focal abnormality. Adrenals/Urinary Tract: The adrenal glands are within normal limits. No renal calculus bilaterally. There is mild hydroureteronephrosis on the left of the collecting system to the level of inflammatory changes in the left lower quadrant. Urothelial enhancement is noted in the mid to distal ureter. No obstructive uropathy is seen. A Foley catheter is present in the urinary bladder in the bladder is decompressed. Soft tissue thickening with enhancement is noted along the superior left aspect of the urinary bladder in the region of inflammatory changes. Air is identified in the urinary bladder, possibly related to fistula or Foley catheter placement. Stomach/Bowel: The stomach is within normal limits. There is no bowel obstruction. Colonic diverticulosis is again seen with bowel wall thickening and surrounding inflammatory changes in the left lower quadrant. Foci of contained air and soft tissue density are noted in the left lower quadrant adjacent to the sigmoid colon forming a tract to the urinary bladder and ovary on the left, not significantly changed from the prior exam. Appendix appears normal. Vascular/Lymphatic: Aortic atherosclerosis. No enlarged abdominal or pelvic lymph nodes. Reproductive: Status post hysterectomy. No adnexal masses. Other: There is a trace amount of  free fluid in the left pericolic gutter. A small fat containing umbilical hernia is noted. Musculoskeletal: Degenerative changes are present in the thoracolumbar spine. No acute osseous abnormality. IMPRESSION: 1. Acute sigmoid diverticulitis with contained collection of air with associated soft tissue density in the left lower quadrant extending to the urinary bladder and ovary on the left, concerning for fistula formation. Enteric contrast has not reached this region of the sigmoid colon at the time of this scan and delayed imaging should be considered once contrast has progressed through the colon to assess for  fistula. 2. Mild hydroureteronephrosis on the left to the level of inflammatory changes in the left lower quadrant, likely related to local inflammatory changes. Correlation with urinalysis is suggested to exclude superimposed infection. 3. Hepatic steatosis. 4. Aortic atherosclerosis and coronary artery calcifications. Electronically Signed   By: Leita Birmingham M.D.   On: 09/08/2024 14:20    Scheduled Meds:  albuterol   2.5 mg Nebulization BID   budesonide -glycopyrrolate -formoterol   2 puff Inhalation BID AC & HS   Chlorhexidine  Gluconate Cloth  6 each Topical Daily   enoxaparin  (LOVENOX ) injection  40 mg Subcutaneous Q24H   gabapentin   200 mg Oral QHS   sodium chloride  flush  3 mL Intravenous Q12H   Continuous Infusions:  cefTRIAXone  (ROCEPHIN )  IV 2 g (09/09/24 1659)   metronidazole  500 mg (09/10/24 0905)     LOS: 8 days   Ivonne Mustache, MD [facility] P11/25/2025, 11:23 AM

## 2024-09-10 NOTE — Progress Notes (Signed)
 Mobility Specialist Progress Note:  [place] 3 L Pre-Mobility: 99% SpO2 Post-Mobility: 97% SpO2   09/10/24 1335  Mobility  Activity Ambulated with assistance  Level of Assistance Contact guard assist, steadying assist  Assistive Device Front wheel walker  Distance Ambulated (ft) 220 ft  Activity Response Tolerated well  Mobility Referral Yes  Mobility visit 1 Mobility  Mobility Specialist Start Time (ACUTE ONLY) 1310  Mobility Specialist Stop Time (ACUTE ONLY) 1330  Mobility Specialist Time Calculation (min) (ACUTE ONLY) 20 min   Pt was received in bed and agreed to mobility. No complaints during ambulation. Returned to bed with all needs met. Call bell in reach.   [employer] - Mobility Specialist

## 2024-09-11 DIAGNOSIS — K632 Fistula of intestine: Secondary | ICD-10-CM | POA: Diagnosis not present

## 2024-09-11 LAB — CBC WITH DIFFERENTIAL/PLATELET
Abs Immature Granulocytes: 0.02 K/uL (ref 0.00–0.07)
Basophils Absolute: 0.1 K/uL (ref 0.0–0.1)
Basophils Relative: 1 %
Eosinophils Absolute: 0.3 K/uL (ref 0.0–0.5)
Eosinophils Relative: 4 %
HCT: 30.1 % — ABNORMAL LOW (ref 36.0–46.0)
Hemoglobin: 9.4 g/dL — ABNORMAL LOW (ref 12.0–15.0)
Immature Granulocytes: 0 %
Lymphocytes Relative: 23 %
Lymphs Abs: 1.9 K/uL (ref 0.7–4.0)
MCH: 28.7 pg (ref 26.0–34.0)
MCHC: 31.2 g/dL (ref 30.0–36.0)
MCV: 91.8 fL (ref 80.0–100.0)
Monocytes Absolute: 0.7 K/uL (ref 0.1–1.0)
Monocytes Relative: 9 %
Neutro Abs: 5.4 K/uL (ref 1.7–7.7)
Neutrophils Relative %: 63 %
Platelets: 447 K/uL — ABNORMAL HIGH (ref 150–400)
RBC: 3.28 MIL/uL — ABNORMAL LOW (ref 3.87–5.11)
RDW: 13.6 % (ref 11.5–15.5)
WBC: 8.5 K/uL (ref 4.0–10.5)
nRBC: 0 % (ref 0.0–0.2)

## 2024-09-11 LAB — BASIC METABOLIC PANEL WITH GFR
Anion gap: 5 (ref 5–15)
BUN: <5 mg/dL — ABNORMAL LOW (ref 8–23)
CO2: 33 mmol/L — ABNORMAL HIGH (ref 22–32)
Calcium: 8.4 mg/dL — ABNORMAL LOW (ref 8.9–10.3)
Chloride: 99 mmol/L (ref 98–111)
Creatinine, Ser: 0.46 mg/dL (ref 0.44–1.00)
GFR, Estimated: >60 mL/min (ref >60)
Glucose, Bld: 92 mg/dL (ref 70–99)
Potassium: 4.3 mmol/L (ref 3.5–5.1)
Sodium: 137 mmol/L (ref 135–145)

## 2024-09-11 NOTE — Progress Notes (Signed)
 Assessment & Plan: HD#10 - Sigmoid diverticulitis with colovesical fistula -WBC 8.5 this AM -GI planning colonoscopy -urology consultation by Dr. Carolee appreciated - Foley to come out today -Will plan for her to follow up outpatient with colorectal specialist after her colonoscopy -Continue with IV antibiotics - Rocephin /flagyl  - approx day #10/14 -will follow        [name], MD [facility] practice Office: [phone]        Chief Complaint: Diverticulitis with colovesical fistula  Subjective: Patient up in chair, eating breakfast.  No complaints.  Excited about Foley being removed.  Objective: Vital signs in last 24 hours: Temp:  [98.2 F (36.8 C)-98.9 F (37.2 C)] 98.9 F (37.2 C) (11/26 0555) Pulse Rate:  [92-104] 104 (11/26 0555) Resp:  [18-24] 18 (11/26 0555) BP: (109-141)/(58-75) 114/58 (11/26 0555) SpO2:  [95 %-100 %] 95 % (11/26 0809) Last BM Date : 09/10/24  Intake/Output from previous day: 11/25 0701 - 11/26 0700 In: 300 [IV Piggyback:300] Out: 700 [Urine:700] Intake/Output this shift: No intake/output data recorded.  Physical Exam: HEENT - sclerae clear, mucous membranes moist Abdomen - softer, persistent mild tenderness left suprapubic region  Lab Results:  Recent Labs    09/10/24 0423 09/11/24 0451  WBC 8.6 8.5  HGB 9.3* 9.4*  HCT 29.9* 30.1*  PLT 444* 447*   BMET Recent Labs    09/11/24 0451  NA 137  K 4.3  CL 99  CO2 33*  GLUCOSE 92  BUN <5*  CREATININE 0.46  CALCIUM  8.4*   PT/INR No results for input(s): LABPROT, INR in the last 72 hours. Comprehensive Metabolic Panel:    Component Value Date/Time   NA 137 09/11/2024 0451   NA 138 09/06/2024 0427   K 4.3 09/11/2024 0451   K 3.7 09/06/2024 0427   CL 99 09/11/2024 0451   CL 100 09/06/2024 0427   CO2 33 (H) 09/11/2024 0451   CO2 34 (H) 09/06/2024 0427   BUN <5 (L) 09/11/2024 0451   BUN 7 (L) 09/06/2024 0427   CREATININE 0.46  09/11/2024 0451   CREATININE 0.46 09/06/2024 0427   GLUCOSE 92 09/11/2024 0451   GLUCOSE 92 09/06/2024 0427   CALCIUM  8.4 (L) 09/11/2024 0451   CALCIUM  8.5 (L) 09/06/2024 0427   AST 11 (L) 09/05/2024 0503   AST 16 09/04/2024 0434   ALT 6 09/05/2024 0503   ALT 7 09/04/2024 0434   ALKPHOS 85 09/05/2024 0503   ALKPHOS 89 09/04/2024 0434   BILITOT 0.3 09/05/2024 0503   BILITOT 0.2 09/04/2024 0434   PROT 5.9 (L) 09/05/2024 0503   PROT 5.9 (L) 09/04/2024 0434   ALBUMIN 3.3 (L) 09/05/2024 0503   ALBUMIN 3.3 (L) 09/04/2024 0434    Studies/Results: CT PELVIS WO CONTRAST Result Date: 09/09/2024 EXAM: CT PELVIS, WITHOUT IV CONTRAST 09/09/2024 10:49:00 PM TECHNIQUE: Axial images were acquired through the pelvis without IV contrast. Reformatted images were reviewed. Automated exposure control, iterative reconstruction, and/or weight based adjustment of the mA/kV was utilized to reduce the radiation dose to as low as reasonably achievable. COMPARISON: CT abdomen and pelvis with IV and oral contrast yesterday, and CT abdomen and pelvis without contrast on 09/02/2024. CLINICAL HISTORY: Abdominal pain, acute (Ped 0-17y). FINDINGS: BONES: No acute fracture or focal osseous lesion. JOINTS: No dislocation. The joint spaces are normal. SOFT TISSUES: Mild body wall edema in the upper thighs and hips. INTRAPELVIC CONTENTS: There is contrast throughout the visualized colon, which may have been instilled retrograde  per rectum. The visualized small bowel is normal in caliber. The appendix is normal. The visualized colon wall is unremarkable until the distal descending segment and sigmoid, which show advanced diverticulosis. Changes of active sigmoid diverticulitis involving the proximal to mid sigmoid segment are again noted with wall thickening and stranding, minimally improved. The underlying bladder is contracted around the foley catheter with wall thickening and stranding consistent with cystitis. There is trace  air in the bladder. Soft tissue thickening extending from the dorsal surface of the diseased sigmoid colon again extends to the left side of the bladder dome; on yesterday's study, this contained air locules within the soft tissue thickening consistent with a fistula. There is no contrast in the fistula at this time. No enteric contrast is seen in the bladder. These findings were actually better demonstrated on yesterday's study with contrast. There is a mild left ureteral distention extending down to the inflammatory changes. The inflammatory process extends around the left ovary, but there is no evidence of a tubo-ovarian abscess. There is trace ascites in the posterior deep pelvis. There is no free air or localizing fluid collection. No adenopathy or soft tissue mass and no incarcerated hernia. VASCULATURE: There is moderate aortoiliac calcific plaque. No aneurysm is seen. IMPRESSION: 1. Active sigmoid diverticulitis, minimally improved, with associated soft tissue thickening extending to the left bladder dome consistent with a fistula; no contrast identified within the fistula on this exam, with better demonstration on the prior contrast-enhanced study. 2. Cystitis with trace intravesical air. 3. Mild left ureteral distention extending down to the  inflammatory changes. 4. Inflammatory changes surrounding the left ovary without evidence of tubo-ovarian abscess. 5. Trace pelvic ascites. Electronically signed by: Francis Quam MD 09/09/2024 11:21 PM EST RP Workstation: [id number]      [name] 09/11/2024  Patient ID: Lynn Kline, female   DOB: 1955/12/04, 68 y.o.   MRN: [id number]

## 2024-09-11 NOTE — Plan of Care (Signed)
  Problem: Education: Goal: Knowledge of General Education information will improve Description: Including pain rating scale, medication(s)/side effects and non-pharmacologic comfort measures Outcome: Progressing   Problem: Clinical Measurements: Goal: Will remain free from infection Outcome: Progressing Goal: Respiratory complications will improve Outcome: Progressing Goal: Cardiovascular complication will be avoided Outcome: Progressing   Problem: Nutrition: Goal: Adequate nutrition will be maintained Outcome: Progressing   Problem: Elimination: Goal: Will not experience complications related to bowel motility Outcome: Progressing Goal: Will not experience complications related to urinary retention Outcome: Progressing   Problem: Pain Managment: Goal: General experience of comfort will improve and/or be controlled Outcome: Progressing   Problem: Safety: Goal: Ability to remain free from injury will improve Outcome: Progressing   Problem: Skin Integrity: Goal: Risk for impaired skin integrity will decrease Outcome: Progressing

## 2024-09-11 NOTE — TOC Progression Note (Signed)
 Transition of Care [facility]) - Progression Note    Patient Details  Name: Lynn Kline MRN: [id number] Date of Birth: Feb 10, 1956  [facility] CM/SW Contact  Doneta Glenys DASEN, RN Phone Number: 09/11/2024, 9:47 AM  Clinical Narrative:    CM asked Dr. Jillian if patient will be discharge on IV antibiotics. Waiting to proceed.                     Expected Discharge Plan and Services                                               Social Drivers of Health (SDOH) Interventions SDOH Screenings   Food Insecurity: Food Insecurity Present (09/02/2024)  Housing: Low Risk  (09/02/2024)  Transportation Needs: No Transportation Needs (09/02/2024)  Utilities: Not At Risk (09/02/2024)  Alcohol Screen: Low Risk  (02/20/2024)  Depression (PHQ2-9): Low Risk  (07/17/2024)  Financial Resource Strain: Medium Risk (04/09/2024)  Physical Activity: Insufficiently Active (04/09/2024)  Social Connections: Socially Isolated (09/02/2024)  Stress: Stress Concern Present (04/09/2024)  Tobacco Use: Medium Risk (09/02/2024)  Health Literacy: Adequate Health Literacy (02/20/2024)    Readmission Risk Interventions    04/08/2023    3:06 PM  Readmission Risk Prevention Plan  Post Dischage Appt Complete  Medication Screening Complete  Transportation Screening Complete

## 2024-09-11 NOTE — Progress Notes (Signed)
 PROGRESS NOTE  Lynn Kline  FMW:[id number] DOB: 1956/05/22 DOA: 09/02/2024 PCP: [name], MD   Brief Narrative: Patient is a 68 year old female with history of anxiety, COPD, chronic hypoxic respiratory failure requiring 3 of oxygen  at baseline, diverticulosis, colon polyps who presented with urinary incontinence, left-sided abdominal pain.  UA was suspicious for UTI.  Urine culture grew multiple species.  Abdomen/pelvis CT showed sigmoid diverticulitis complicated by a fistulous communication to the bladder with secondary cystitis.  Consulted GI, plan for colonoscopy as an outpatient.  General surgery following. Consulted  urology as well .currently on ceftriaxone , Flagyl , plan for 2 weeks of antibiotics course during this hospitalization, day 10/14  Assessment & Plan:  Principal Problem:   Colonic fistula Active Problems:   Diverticulitis  Sigmoid diverticulitis/colovesical fistula: Presented with abdominal pain, urinary in continence. UA was suspicious for UTI.  Urine culture grew multiple species.  Abdomen/pelvis CT on 11/23 showed sigmoid diverticulitis complicated by a fistulous communication to the bladder with secondary cystitis. CT pelvis without contrast on 11/24 showed active sigmoid diverticulitis, minimally improved, with associated soft tissue thickening extending to the left bladder dome consistent with a fistula,Cystitis with trace intravesical air,mild left ureteral distention extending down to the  inflammatory changes,Inflammatory changes surrounding the left ovary without evidence of tubo-ovarian abscess. General surgery recommended to obtain urology consultation.  Dr. Carolee saw her on 11/25.  Recommended no intervention for now.  Recommended renal ultrasound in couple of weeks and cystoscopy as an outpatient Plan to continue 2 weeks of IV antibiotic therapy during this hospitalization.  Currently on Rocephin , Flagyl .  Day 10 /14. General surgery plan to follow-up  as an outpatient with colorectal specialist after colonoscopy. Currently on soft diet.  Foley has been removed  Colovesical fistula: GI planning to perform colonoscopy in January to reevaluate prior to consideration of surgery.   Chronic hypoxic respiratory failure/COPD: Continue bronchodilators as needed.  On 3 L of oxygen  at home at baseline.  Hyperlipidemia: Takes Crestor  at home  Iron deficiency anemia: Current hemoglobin in the range of 9.  Iron studies showed low iron.  Continue iron supplementation  Deconditioning: Patient lives with her dog.  Ambulatory at baseline.  Will consult PT       DVT prophylaxis:enoxaparin  (LOVENOX ) injection 40 mg Start: 09/08/24 1000 SCDs Start: 09/02/24 1946     Code Status: Full Code  Family Communication: None at bedside  Patient status:Inpatient  Patient is from :home  Anticipated discharge un:ynfz  Estimated DC date:after finishing IV antibiotics,after general surgery clearane   Consultants: General surgery, GI  Procedures: None yet  Antimicrobials:  Anti-infectives (From admission, onward)    Start     Dose/Rate Route Frequency Ordered Stop   09/03/24 1600  cefTRIAXone  (ROCEPHIN ) 2 g in sodium chloride  0.9 % 100 mL IVPB        2 g 200 mL/hr over 30 Minutes Intravenous Every 24 hours 09/02/24 1944     09/03/24 0800  metroNIDAZOLE  (FLAGYL ) IVPB 500 mg        500 mg 100 mL/hr over 60 Minutes Intravenous Every 12 hours 09/02/24 1944     09/02/24 1715  metroNIDAZOLE  (FLAGYL ) IVPB 500 mg        500 mg 100 mL/hr over 60 Minutes Intravenous  Once 09/02/24 1710 09/02/24 1950   09/02/24 1530  cefTRIAXone  (ROCEPHIN ) 1 g in sodium chloride  0.9 % 100 mL IVPB        1 g 200 mL/hr over 30 Minutes Intravenous  Once  09/02/24 1529 09/02/24 1659       Subjective: Patient seen and examined at bedside today.  Appears comfortable.  Denies any abdomen pain, nausea or vomiting today.  Still having loose stools.  Foley has been removed.  She  is tolerating soft diet.  Objective: Vitals:   09/10/24 2016 09/10/24 2103 09/11/24 0555 09/11/24 0809  BP: (!) 141/75  (!) 114/58   Pulse: 100  (!) 104   Resp: 18  18   Temp: 98.2 F (36.8 C)  98.9 F (37.2 C)   TempSrc:      SpO2: 95% 96% 95% 95%    Intake/Output Summary (Last 24 hours) at 09/11/2024 1157 Last data filed at 09/11/2024 0912 Gross per 24 hour  Intake 219.92 ml  Output 825 ml  Net -605.08 ml   There were no vitals filed for this visit.  Examination:  General exam: Overall comfortable, not in distress, pleasant female HEENT: PERRL Respiratory system:  no wheezes or crackles  Cardiovascular system: S1 & S2 heard, RRR.  Gastrointestinal system: Abdomen is nondistended, soft and nontender.  Mild tenderness in the left lower quadrant Central nervous system: Alert and oriented Extremities: No edema, no clubbing ,no cyanosis Skin: No rashes, no ulcers,no icterus     Data Reviewed: I have personally reviewed following labs and imaging studies  CBC: Recent Labs  Lab 09/06/24 0427 09/07/24 0454 09/08/24 1130 09/09/24 0413 09/10/24 0423 09/11/24 0451  WBC 11.3* 12.5* 12.9* 9.7 8.6 8.5  NEUTROABS 7.9*  --  9.9* 6.6 5.5 5.4  HGB 9.7* 9.4* 10.3* 9.5* 9.3* 9.4*  HCT 30.7* 30.2* 33.1* 30.4* 29.9* 30.1*  MCV 91.9 93.2 93.8 93.0 92.0 91.8  PLT 377 382 464* 405* 444* 447*   Basic Metabolic Panel: Recent Labs  Lab 09/05/24 0503 09/06/24 0427 09/11/24 0451  NA 138 138 137  K 3.5 3.7 4.3  CL 98 100 99  CO2 33* 34* 33*  GLUCOSE 97 92 92  BUN <5* 7* <5*  CREATININE 0.55 0.46 0.46  CALCIUM  9.0 8.5* 8.4*     Recent Results (from the past 240 hours)  Urine Culture     Status: Abnormal   Collection Time: 09/02/24 11:03 AM   Specimen: Urine, Random  Result Value Ref Range Status   Specimen Description   Final    URINE, RANDOM Performed at [facility], [address], [place], [place] [address]    Special Requests   Final    NONE  Reflexed from [id number] Performed at [facility], [address], [place], [place] [address]    Culture MULTIPLE SPECIES PRESENT, SUGGEST RECOLLECTION (A)  Final   Report Status 09/03/2024 FINAL  Final     Radiology Studies: CT PELVIS WO CONTRAST Result Date: 09/09/2024 EXAM: CT PELVIS, WITHOUT IV CONTRAST 09/09/2024 10:49:00 PM TECHNIQUE: Axial images were acquired through the pelvis without IV contrast. Reformatted images were reviewed. Automated exposure control, iterative reconstruction, and/or weight based adjustment of the mA/kV was utilized to reduce the radiation dose to as low as reasonably achievable. COMPARISON: CT abdomen and pelvis with IV and oral contrast yesterday, and CT abdomen and pelvis without contrast on 09/02/2024. CLINICAL HISTORY: Abdominal pain, acute (Ped 0-17y). FINDINGS: BONES: No acute fracture or focal osseous lesion. JOINTS: No dislocation. The joint spaces are normal. SOFT TISSUES: Mild body wall edema in the upper thighs and hips. INTRAPELVIC CONTENTS: There is contrast throughout the visualized colon, which may have been instilled retrograde per rectum. The visualized  small bowel is normal in caliber. The appendix is normal. The visualized colon wall is unremarkable until the distal descending segment and sigmoid, which show advanced diverticulosis. Changes of active sigmoid diverticulitis involving the proximal to mid sigmoid segment are again noted with wall thickening and stranding, minimally improved. The underlying bladder is contracted around the foley catheter with wall thickening and stranding consistent with cystitis. There is trace air in the bladder. Soft tissue thickening extending from the dorsal surface of the diseased sigmoid colon again extends to the left side of the bladder dome; on yesterday's study, this contained air locules within the soft tissue thickening consistent with a fistula. There is no contrast in the fistula at this time.  No enteric contrast is seen in the bladder. These findings were actually better demonstrated on yesterday's study with contrast. There is a mild left ureteral distention extending down to the inflammatory changes. The inflammatory process extends around the left ovary, but there is no evidence of a tubo-ovarian abscess. There is trace ascites in the posterior deep pelvis. There is no free air or localizing fluid collection. No adenopathy or soft tissue mass and no incarcerated hernia. VASCULATURE: There is moderate aortoiliac calcific plaque. No aneurysm is seen. IMPRESSION: 1. Active sigmoid diverticulitis, minimally improved, with associated soft tissue thickening extending to the left bladder dome consistent with a fistula; no contrast identified within the fistula on this exam, with better demonstration on the prior contrast-enhanced study. 2. Cystitis with trace intravesical air. 3. Mild left ureteral distention extending down to the  inflammatory changes. 4. Inflammatory changes surrounding the left ovary without evidence of tubo-ovarian abscess. 5. Trace pelvic ascites. Electronically signed by: Francis Quam MD 09/09/2024 11:21 PM EST RP Workstation: [id number]    Scheduled Meds:  albuterol   2.5 mg Nebulization BID   budesonide -glycopyrrolate -formoterol   2 puff Inhalation BID AC & HS   Chlorhexidine  Gluconate Cloth  6 each Topical Daily   enoxaparin  (LOVENOX ) injection  40 mg Subcutaneous Q24H   ferrous sulfate   325 mg Oral Q breakfast   gabapentin   200 mg Oral QHS   sodium chloride  flush  3 mL Intravenous Q12H   Continuous Infusions:  cefTRIAXone  (ROCEPHIN )  IV 2 g (09/10/24 1533)   metronidazole  500 mg (09/11/24 0917)     LOS: 9 days   Ivonne Mustache, MD [facility] P11/26/2025, 11:57 AM

## 2024-09-12 ENCOUNTER — Other Ambulatory Visit: Payer: Self-pay | Admitting: Internal Medicine

## 2024-09-12 DIAGNOSIS — K632 Fistula of intestine: Secondary | ICD-10-CM | POA: Diagnosis not present

## 2024-09-12 DIAGNOSIS — F411 Generalized anxiety disorder: Secondary | ICD-10-CM

## 2024-09-12 LAB — CBC WITH DIFFERENTIAL/PLATELET
Abs Immature Granulocytes: 0.02 K/uL (ref 0.00–0.07)
Basophils Absolute: 0.1 K/uL (ref 0.0–0.1)
Basophils Relative: 1 %
Eosinophils Absolute: 0.3 K/uL (ref 0.0–0.5)
Eosinophils Relative: 4 %
HCT: 31.3 % — ABNORMAL LOW (ref 36.0–46.0)
Hemoglobin: 9.9 g/dL — ABNORMAL LOW (ref 12.0–15.0)
Immature Granulocytes: 0 %
Lymphocytes Relative: 29 %
Lymphs Abs: 2.6 K/uL (ref 0.7–4.0)
MCH: 29 pg (ref 26.0–34.0)
MCHC: 31.6 g/dL (ref 30.0–36.0)
MCV: 91.8 fL (ref 80.0–100.0)
Monocytes Absolute: 0.7 K/uL (ref 0.1–1.0)
Monocytes Relative: 8 %
Neutro Abs: 5.3 K/uL (ref 1.7–7.7)
Neutrophils Relative %: 58 %
Platelets: 468 K/uL — ABNORMAL HIGH (ref 150–400)
RBC: 3.41 MIL/uL — ABNORMAL LOW (ref 3.87–5.11)
RDW: 13.8 % (ref 11.5–15.5)
WBC: 9 K/uL (ref 4.0–10.5)
nRBC: 0 % (ref 0.0–0.2)

## 2024-09-12 NOTE — Progress Notes (Signed)
   Subjective/Chief Complaint: Pt reports abdominal pain is less and she is able to void ok after foley removal Having bm's Tolerating a soft diet   Objective: Vital signs in last 24 hours: Temp:  [97.3 F (36.3 C)-98 F (36.7 C)] 97.3 F (36.3 C) (11/27 0653) Pulse Rate:  [89-93] 93 (11/27 0653) Resp:  [18] 18 (11/26 1424) BP: (118-138)/(69-91) 138/91 (11/27 0653) SpO2:  [97 %-98 %] 97 % (11/27 0653) Weight:  [57.6 kg] 57.6 kg (11/26 2058) Last BM Date : 09/11/24  Intake/Output from previous day: 11/26 0701 - 11/27 0700 In: 10 [I.V.:10] Out: 125 [Urine:125] Intake/Output this shift: No intake/output data recorded.  Exam: Awake and alert Abdomen soft with mild tenderness and guarding in the LLQ  Lab Results:  Recent Labs    09/11/24 0451 09/12/24 0433  WBC 8.5 9.0  HGB 9.4* 9.9*  HCT 30.1* 31.3*  PLT 447* 468*   BMET Recent Labs    09/11/24 0451  NA 137  K 4.3  CL 99  CO2 33*  GLUCOSE 92  BUN <5*  CREATININE 0.46  CALCIUM  8.4*   PT/INR No results for input(s): LABPROT, INR in the last 72 hours. ABG No results for input(s): PHART, HCO3 in the last 72 hours.  Invalid input(s): PCO2, PO2  Studies/Results: No results found.  Anti-infectives: Anti-infectives (From admission, onward)    Start     Dose/Rate Route Frequency Ordered Stop   09/03/24 1600  cefTRIAXone  (ROCEPHIN ) 2 g in sodium chloride  0.9 % 100 mL IVPB        2 g 200 mL/hr over 30 Minutes Intravenous Every 24 hours 09/02/24 1944     09/03/24 0800  metroNIDAZOLE  (FLAGYL ) IVPB 500 mg        500 mg 100 mL/hr over 60 Minutes Intravenous Every 12 hours 09/02/24 1944     09/02/24 1715  metroNIDAZOLE  (FLAGYL ) IVPB 500 mg        500 mg 100 mL/hr over 60 Minutes Intravenous  Once 09/02/24 1710 09/02/24 1950   09/02/24 1530  cefTRIAXone  (ROCEPHIN ) 1 g in sodium chloride  0.9 % 100 mL IVPB        1 g 200 mL/hr over 30 Minutes Intravenous  Once 09/02/24 1529 09/02/24 1659        Assessment/Plan: HD#11 - Sigmoid diverticulitis with colovesical fistula  -WBC still normal and Hgb stable -continuing IV antibiotics -tolerating foley removal -outpt follow-up arranged   Vicenta Poli 09/12/2024

## 2024-09-12 NOTE — Progress Notes (Signed)
 PROGRESS NOTE  Lynn Kline  FMW:[id number] DOB: Feb 20, 1956 DOA: 09/02/2024 PCP: [name], MD   Brief Narrative: Patient is a 68 year old female with history of anxiety, COPD, chronic hypoxic respiratory failure requiring 3 of oxygen  at baseline, diverticulosis, colon polyps who presented with urinary incontinence, left-sided abdominal pain.  UA was suspicious for UTI.  Urine culture grew multiple species.  Abdomen/pelvis CT showed sigmoid diverticulitis complicated by a fistulous communication to the bladder with secondary cystitis.  Consulted GI, plan for colonoscopy as an outpatient.  General surgery following. Consulted  urology as well .currently on ceftriaxone , Flagyl , plan for 2 weeks of antibiotics course during this hospitalization, day 11/14  Assessment & Plan:  Principal Problem:   Colonic fistula Active Problems:   Diverticulitis  Sigmoid diverticulitis/colovesical fistula: Presented with abdominal pain, urinary in continence. UA was suspicious for UTI.  Urine culture grew multiple species.  Abdomen/pelvis CT on 11/23 showed sigmoid diverticulitis complicated by a fistulous communication to the bladder with secondary cystitis. CT pelvis without contrast on 11/24 showed active sigmoid diverticulitis, minimally improved, with associated soft tissue thickening extending to the left bladder dome consistent with a fistula,Cystitis with trace intravesical air,mild left ureteral distention extending down to the  inflammatory changes,Inflammatory changes surrounding the left ovary without evidence of tubo-ovarian abscess. General surgery recommended to obtain urology consultation.  Dr. Carolee saw her on 11/25.  Recommended no intervention for now.  Recommended renal ultrasound in couple of weeks and cystoscopy as an outpatient Plan to continue 2 weeks of IV antibiotic therapy during this hospitalization.  Currently on Rocephin , Flagyl .  Day 11 /14. General surgery plan to follow-up  as an outpatient with colorectal specialist after colonoscopy. Currently on soft diet and she is tolerating.  Foley has been removed  Colovesical fistula: GI planning to perform colonoscopy in January to reevaluate prior to consideration of surgery.   Chronic hypoxic respiratory failure/COPD: Continue bronchodilators as needed.  On 3 L of oxygen  at home at baseline.  Hyperlipidemia: Takes Crestor  at home  Iron deficiency anemia: Current hemoglobin in the range of 9.  Iron studies showed low iron.  Continue iron supplementation  Deconditioning: Patient lives with her dog.  Ambulatory at baseline.  Will consult PT       DVT prophylaxis:enoxaparin  (LOVENOX ) injection 40 mg Start: 09/08/24 1000 SCDs Start: 09/02/24 1946     Code Status: Full Code  Family Communication: None at bedside  Patient status:Inpatient  Patient is from :home  Anticipated discharge un:ynfz  Estimated DC date:after finishing IV antibiotics,after general surgery clearance.  Likely on Sunday   Consultants: General surgery, GI  Procedures: None yet  Antimicrobials:  Anti-infectives (From admission, onward)    Start     Dose/Rate Route Frequency Ordered Stop   09/03/24 1600  cefTRIAXone (ROCEPHIN) 2 g in sodium chloride 0.9 % 100 mL IVPB        2 g 200 mL/hr over 30 Minutes Intravenous Every 24 hours 09/02/24 1944     09/03/24 0800  metroNIDAZOLE (FLAGYL) IVPB 500 mg        500 mg 100 mL/hr over 60 Minutes Intravenous Every 12 hours 09/02/24 1944     09/02/24 1715  metroNIDAZOLE (FLAGYL) IVPB 500 mg        50 0 mg 100 mL/hr over 60 Minutes Intravenous  Once 09/02/24 1710 09/02/24 1950   09/02/24 1530  cefTRIAXone  (ROCEPHIN ) 1 g in sodium chloride  0.9 % 100 mL IVPB        1 g  200 mL/hr over 30 Minutes Intravenous  Once 09/02/24 1529 09/02/24 1659       Subjective: Patient seen and examined at bedside today.  Hemodynamically stable.  Comfortable.  Ambulating inside the room.  Complains of some  left wrist pain today.She  has lower extremity edema.  Offered 1 dose of IV Lasix but she declined.  Tolerating soft diet.  Diarrhea has slowed down.  Objective: Vitals:   09/11/24 1810 09/11/24 1811 09/11/24 2058 09/12/24 0653  BP:    (!) 138/91  Pulse:    93  Resp:      Temp:    (!) 97.3 F (36.3 C)  TempSrc:    Oral  SpO2: 97% 97%  97%  Weight:   57.6 kg   Height:   5' 2 (1.575 m)     Intake/Output Summary (Last 24 hours) at 09/12/2024 1047 Last data filed at 09/12/2024 1002 Gross per 24 hour  Intake 10 ml  Output --  Net 10 ml   Filed Weights   09/11/24 2058  Weight: 57.6 kg    Examination:   General exam: Overall comfortable, not in distress, pleasant HEENT: PERRL Respiratory system:  no wheezes or crackles  Cardiovascular system: S1 & S2 heard, RRR.  Gastrointestinal system: Abdomen is nondistended, soft .  Mild tenderness on the left lower quadrant Central nervous system: Alert and oriented Extremities:  no clubbing ,no cyanosis, mild edema of the left wrist, pitting edema of the bilateral lower extremities Skin: No rashes, no ulcers,no icterus       Data Reviewed: I have personally reviewed following labs and imaging studies  CBC: Recent Labs  Lab 09/08/24 1130 09/09/24 0413 09/10/24 0423 09/11/24 0451 09/12/24 0433  WBC 12.9* 9.7 8.6 8.5 9.0  NEUTROABS 9.9* 6.6 5.5 5.4 5.3  HGB 10.3* 9.5* 9.3* 9.4* 9.9*  HCT 33.1* 30.4* 29.9* 30.1* 31.3*  MCV 93.8 93.0 92.0 91.8 91.8  PLT 464* 405* 444* 447* 468*   Basic Metabolic Panel: Recent Labs  Lab 09/06/24 0427 09/11/24 0451  NA 138 137  K 3.7 4.3  CL 100 99  CO2 34* 33*  GLUCOSE 92 92  BUN 7* <5*  CREATININE 0.46 0.46  CALCIUM  8.5* 8.4*     Recent Results (from the past 240 hours)  Urine Culture     Status: Abnormal   Collection Time: 09/02/24 11:03 AM   Specimen: Urine, Random  Result Value Ref Range Status   Specimen Description   Final    URINE, RANDOM Performed at [facility], [address], [place], [place] [address]    Special Requests   Final    NONE Reflexed from [id number] Performed at [facility], [address], [place], [place] [address]    Culture MULTIPLE SPECIES PRESENT, SUGGEST RECOLLECTION (A)  Final   Report Status 09/03/2024 FINAL  Final     Radiology Studies: No results found.   Scheduled Meds:  albuterol   2.5 mg Nebulization BID   budesonide -glycopyrrolate -formoterol   2 puff Inhalation BID AC & HS   Chlorhexidine  Gluconate Cloth  6 each Topical Daily   enoxaparin  (LOVENOX ) injection  40 mg Subcutaneous Q24H   ferrous sulfate   325 mg Oral Q breakfast   gabapentin   200 mg Oral QHS   sodium chloride  flush  3 mL Intravenous Q12H   Continuous Infusions:  cefTRIAXone  (ROCEPHIN )  IV 2 g (09/11/24 1641)   metronidazole  500 mg (09/12/24 1008)     LOS: 10 days  Ivonne Mustache, MD [facility] P11/27/2025, 10:47 AM

## 2024-09-12 NOTE — Evaluation (Signed)
 Physical Therapy Evaluation Patient Details Name: BRENT NOTO MRN: [id number] DOB: [birth year] Today's Date: 09/12/2024  History of Present Illness  68 y.o. female Presented 09/02/24 with abdominal pain, urinary in continence. UA was suspicious for UTI.  Urine culture grew multiple species.  Abdomen/pelvis CT on 11/23 showed sigmoid diverticulitis complicated by a fistulous communication to the bladder with secondary cystitis.  Clinical Impression  Pt is mobilizing well at a modified independent level. She ambulated 500' with a RW and 3L O2, no loss of balance, SpO2 93% on 3L walking. Pitting edema noted B legs, encouraged pt to elevate feet and to perform ankle pumps. No further PT indicated, will sign off. Mobility team to follow.         If plan is discharge home, recommend the following:     Can travel by private vehicle        Equipment Recommendations Rollator (4 wheels)  Recommendations for Other Services       Functional Status Assessment Patient has not had a recent decline in their functional status     Precautions / Restrictions Precautions Precautions: Other (comment) Recall of Precautions/Restrictions: Intact Precaution/Restrictions Comments: on 3L O2 @ baseline Restrictions Weight Bearing Restrictions Per Provider Order: No      Mobility  Bed Mobility               General bed mobility comments: up in recliner    Transfers Overall transfer level: Modified independent Equipment used: None Transfers: Sit to/from Stand Sit to Stand: Modified independent (Device/Increase time)           General transfer comment: used armrests    Ambulation/Gait Ambulation/Gait assistance: Modified independent (Device/Increase time) Gait Distance (Feet): 500 Feet Assistive device: Rolling walker (2 wheels) Gait Pattern/deviations: WFL(Within Functional Limits) Gait velocity: WFL     General Gait Details: steady, no loss of balance, SpO2 93% on 3L O2  walking  Stairs            Wheelchair Mobility     Tilt Bed    Modified Rankin (Stroke Patients Only)       Balance Overall balance assessment: Modified Independent                                           Pertinent Vitals/Pain Pain Assessment Pain Assessment: No/denies pain    Home Living Family/patient expects to be discharged to:: Private residence Living Arrangements: Children Available Help at Discharge: Family;Available PRN/intermittently   Home Access: Stairs to enter Entrance Stairs-Rails: Right Entrance Stairs-Number of Steps: 2   Home Layout: One level Home Equipment: None Additional Comments: son lives with pt, he works during the day; uses 3L O2 prn    Prior Function Prior Level of Function : Independent/Modified Independent;Driving             Mobility Comments: walks without AD, no falls in 6 months, 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 ADLs Comments: independent     Extremity/Trunk Assessment   Upper Extremity Assessment Upper Extremity Assessment: Overall WFL for tasks assessed    Lower Extremity Assessment Lower Extremity Assessment: Overall WFL for tasks assessed;RLE deficits/detail;LLE deficits/detail RLE Deficits / Details: pitting edema noted lower LE/ankle RLE Sensation: WNL LLE Deficits / Details: pitting edema noted lower LE/ankle LLE Sensation: WNL    Cervical / Trunk Assessment Cervical / Trunk Assessment: Normal  Communication   Communication Communication:  No apparent difficulties    Cognition Arousal: Alert Behavior During Therapy: WFL for tasks assessed/performed   PT - Cognitive impairments: No apparent impairments                         Following commands: Intact       Cueing       General Comments      Exercises General Exercises - Lower Extremity Ankle Circles/Pumps: AROM, Both, 10 reps, Seated Long Arc Quad: AROM, Both, Seated, 5 reps   Assessment/Plan    PT  Assessment Patient does not need any further PT services  PT Problem List         PT Treatment Interventions      PT Goals (Current goals can be found in the Care Plan section)  Acute Rehab PT Goals PT Goal Formulation: All assessment and education complete, DC therapy    Frequency       Co-evaluation               AM-PAC PT 6 Clicks Mobility  Outcome Measure Help needed turning from your back to your side while in a flat bed without using bedrails?: None Help needed moving from lying on your back to sitting on the side of a flat bed without using bedrails?: None Help needed moving to and from a bed to a chair (including a wheelchair)?: None Help needed standing up from a chair using your arms (e.g., wheelchair or bedside chair)?: None Help needed to walk in hospital room?: None Help needed climbing 3-5 steps with a railing? : None 6 Click Score: 24    End of Session Equipment Utilized During Treatment: Gait belt Activity Tolerance: Patient tolerated treatment well Patient left: in chair;with call bell/phone within reach Nurse Communication: Mobility status      Time: 0820-0843 PT Time Calculation (min) (ACUTE ONLY): 23 min   Charges:   PT Evaluation $PT Eval Low Complexity: 1 Low PT Treatments $Gait Training: 8-22 mins PT General Charges $$ ACUTE PT VISIT: 1 Visit        Sylvan Delon Copp PT 09/12/2024  Acute Rehabilitation Services  Office [phone]

## 2024-09-12 NOTE — Plan of Care (Signed)

## 2024-09-13 ENCOUNTER — Inpatient Hospital Stay (HOSPITAL_COMMUNITY)

## 2024-09-13 ENCOUNTER — Telehealth: Payer: Self-pay | Admitting: Pulmonary Disease

## 2024-09-13 DIAGNOSIS — E440 Moderate protein-calorie malnutrition: Secondary | ICD-10-CM | POA: Insufficient documentation

## 2024-09-13 DIAGNOSIS — J449 Chronic obstructive pulmonary disease, unspecified: Secondary | ICD-10-CM

## 2024-09-13 DIAGNOSIS — Z01818 Encounter for other preprocedural examination: Secondary | ICD-10-CM

## 2024-09-13 DIAGNOSIS — K632 Fistula of intestine: Secondary | ICD-10-CM | POA: Diagnosis not present

## 2024-09-13 DIAGNOSIS — M7989 Other specified soft tissue disorders: Secondary | ICD-10-CM | POA: Diagnosis not present

## 2024-09-13 MED ORDER — FUROSEMIDE 20 MG PO TABS
20.0000 mg | ORAL_TABLET | Freq: Every day | ORAL | Status: DC
  Administered 2024-09-13 – 2024-09-14 (×2): 20 mg via ORAL
  Filled 2024-09-13 (×2): qty 1

## 2024-09-13 MED ORDER — ENSURE PLUS HIGH PROTEIN PO LIQD
237.0000 mL | Freq: Two times a day (BID) | ORAL | Status: DC
  Administered 2024-09-13 – 2024-09-16 (×6): 237 mL via ORAL

## 2024-09-13 MED ORDER — ENSURE SURGERY PO LIQD
237.0000 mL | Freq: Two times a day (BID) | ORAL | Status: DC
  Administered 2024-09-13: 237 mL via ORAL
  Filled 2024-09-13 (×2): qty 237

## 2024-09-13 MED ORDER — IBUPROFEN 200 MG PO TABS
400.0000 mg | ORAL_TABLET | Freq: Once | ORAL | Status: AC
  Administered 2024-09-13: 400 mg via ORAL
  Filled 2024-09-13: qty 2

## 2024-09-13 MED ORDER — CALCIUM POLYCARBOPHIL 625 MG PO TABS
625.0000 mg | ORAL_TABLET | Freq: Two times a day (BID) | ORAL | Status: DC
  Administered 2024-09-13 – 2024-09-16 (×7): 625 mg via ORAL
  Filled 2024-09-13 (×7): qty 1

## 2024-09-13 MED ORDER — TAB-A-VITE/IRON PO TABS
1.0000 | ORAL_TABLET | Freq: Every day | ORAL | Status: DC
  Administered 2024-09-13 – 2024-09-16 (×4): 1 via ORAL
  Filled 2024-09-13 (×6): qty 1

## 2024-09-13 NOTE — Discharge Instructions (Signed)
 Low Fiber Nutrition Therapy   You may need a low-fiber diet if you have Crohn's disease, diverticulitis, gastroparesis, ulcerative colitis, a new colostomy, or new ileostomy. A low-fiber diet may also be needed following radiation therapy to the pelvis and lower bowel or recent intestinal surgery.  A low-fiber diet reduces the frequency and volume of your stools. This lessens irritation to the gastrointestinal (GI) tract and can help you heal. Use this diet if you have a stricture so your intestine doesn't get blocked. The goal of this diet is to get less than 8 grams of fiber daily. It's also important to eat enough protein foods while you are on a low-fiber diet.  Drink nutrition supplements that have 1 gram of fiber or less in each serving. If your stricture is severe or if your inflammation is severe, drink more liquids to reduce symptoms and to get enough calories and protein.  Tips Eat about 5 to 6 small meals daily or about every 3 to 4 hours. Do not skip meals.  Every time you eat, include a small amount of protein (1 to 2 ounces) plus an additional food. Low fiber starch foods are the best choice to eat with protein.  Limit acidic, spicy and high-fat or fried and greasy foods to reduce GI symptoms.  Do not eat raw fruits and vegetables while on this diet. All fruits and vegetables need to be cooked and without peels or skins.  Drink a lot of fluids, at least 8 cups of fluid each day. Limit drinks with caffeine, sugar, and sugar substitutes.  Plain water is the best choice. Avoid mixing drink packets or flavor drops into water. .  Take a chewable multivitamin with minerals. Gummy vitamins do not have enough minerals and can block an ostomy and non-chewable supplements are not easily digested. Chewable supplements must be used if you have a stricture or ostomy.  If you are lactose intolerant, you may need to eat low-lactose dairy products. If you can't tolerate dairy, ask your RDN about how  you can get enough calcium  from other foods.  Do not take a calcium  supplement. They can cause a blockage.  It is important to add high-calcium  foods gradually to your diet and monitor for symptoms to avoid a blockage.  Do not add more fiber to your diet until your health care provider or registered dietitian nutritionist (RDN) tells you it's OK. Fiber is part of whole grains, fruits and vegetables (foods from plants) and needs to be slowly added back in to your diet when your body is healed.  Choose foods that have been safely handled and prepared to lower your risk of foodborne illness. Talk to your RDN or see the Food Safety Nutrition Therapy handout for more information.   Foods Recommended These foods are low in fat and fiber and will help with your GI symptoms. Food Group Foods Recommended  Grains  Choose grain foods with less than 2 grams of fiber per serving. Refined white flour products--for example, enriched white bread without seeds, crackers or pasta Cream of wheat or rice Grits (fine ground) Tortillas: white flour or corn White rice, well-cooked (do not rinse, or soak before cooking) Cold and hot cereals made from white or refined flour such as puffed rice or corn flakes  Protein Foods  Lean, very tender, well-cooked poultry or fish; red meats: beef, pork or lamb (slow cook until soft; chop meats if you have stricture or ostomy) Eggs, well-cooked Smooth nut butters such as almond,  peanut, or sunflower Tofu  Dairy  If you have lactose intolerance, drinking milk products from cows or goats may make diarrhea worse. Foods marked with an asterisk (*) have lactose. Milk: fat-free, 1% or 2% * (choose best tolerated) Lactose-free milk Buttermilk* Fortified non-dairy milks: almond, cashew, coconut, or rice (be aware that these options are not good sources of protein so you will need to eat an additional protein food) Kefir* (Don't include kefir in the diet until approved by your health  care provider) Yogurt*/lactose-free yogurt (without nuts, fruit, granola or chocolate) Mild cheese* (hard and aged cheeses tend to be lower in lactose such as cheddar, swiss or parmesan) Cottage cheese* or lactose-free cottage cheese Low-fat ice cream* or lactose-free ice cream Sherbet* (usually lower lactose)  Vegetables  Canned and well-cooked vegetables without seeds, skins, or hulls  Carrots or green beans, cooked White, red or yellow potatoes without skins Strained vegetable juice  Fruit Soft, and well-cooked fruits without skins, seeds, or membranes Canned fruit in juice: peaches, pears, or applesauce Fruit juice without pulp diluted by half with water may be tolerated better Fruit drinks fortified with vitamin C may be tolerated better than 100% fruit juice  Oils  When possible, choose healthy oils and fats, such as olive and canola oils, plant oils rather than solid fats.  Other  Broth and strained soups made from allowed foods Desserts (small portions) without whole grains, seeds, nuts, raisins, or coconut Jelly (clear)   Foods Not Recommended These foods are higher in fat and fiber and may make your GI symptoms worse.  Food Group Foods Not Recommended  Grains  Bread, whole wheat or with whole grain flour or seeds or nuts Brown rice, quinoa, kasha, barley Tortillas: whole grain Whole wheat pasta Whole grain and high-fiber cereals, including oatmeal, bran flakes or shredded wheat Popcorn  Protein Foods  Steak, pork chops, or other meats that are fatty or have gristle Fried meat, poultry, or fish Seafood with a tough or rubbery texture, such as shrimp Luncheon meats such as bologna and salami Sausage, bacon, or hot dogs Dried beans, peas, or lentils Hummus Sushi Nuts and chunky nut butters  Dairy  Whole milk Pea milk and soymilk (may cause diarrhea, gas, bloating, and abdominal pain) Cream Half-and-half Sour cream Yogurt with added fruit, nuts, or granola or chocolate   Vegetables  Alfalfa or bean sprouts (high fiber and risk for bacteria) Raw or undercooked vegetables: beets; broccoli; brussels sprouts; cabbage; cauliflower; collard, mustard, or turnip greens; corn; cucumber; green peas or any kind of peas; kale; lima beans; mushrooms; okra; olives; pickles and relish; onions; parsnips; peppers; potato skins; sauerkraut; spinach; tomatoes  Fruit Raw fruit Dried fruit Avocado, berries, coconut Canned fruit in syrup Canned fruit with mandarin oranges, papaya or pineapple Fruit juice with pulp Prune juice Fruit skin  Oils  Pork rinds   Low-Fiber (8 grams) Sample 1-Day Menu  Breakfast  cup cream of wheat (0.5 gram fiber)  1 slice white toast (1 gram fiber)  1 teaspoon margarine, soft tub  2 scrambled eggs   Morning Snack 1 cup lactose-free nutrition supplement  Lunch 2 slices white bread (2 grams fiber)  3 tablespoons tuna  1 tablespoon mayonnaise  1 cup chicken noodle soup (1 gram fiber)   cup apple juice   Afternoon Snack 6 saltine crackers (0.5 gram fiber)  2 ounces low-fat cheddar cheese  Evening Meal 3 ounces tender chicken breast  1 cup white rice (0.5 gram fiber)   cup  cooked canned green beans (2 grams fiber)   cup cranberry juice   Evening Snack 1 cup lactose-free nutrition supplement   Copyright 2020  [employer]  High Fiber Nutrition Therapy -for when okay to start adding back fiber Fiber and fluid may help you feel less constipated and bloated and can also help ease diarrhea. Increase fiber slowly over the course of a few weeks. This will keep your symptoms from getting worse. Tips Tips for Adding Fiber to Your Eating Plan Slowly increase the amount of fiber you eat to 25 to 35 grams per day. Eat whole grain breads and cereals. Look for choices with 100% whole wheat, rye, oats, or bran as the first or second ingredient. Have brown or wild rice instead of white rice or potatoes. Enjoy a variety of grains.  Good choices include barley, oats, farro, kamut, and quinoa. Bake with whole wheat flour. You can use it to replace some white or all-purpose flour in recipes. Enjoy baked beans more often! Add dried beans and peas to casseroles or soups. Choose fresh fruit and vegetables instead of juices. Eat fruits and vegetables with peels or skins on. Compare food labels of similar foods to find higher fiber choices. On packaged foods, the amount of fiber per serving is listed on the Nutrition Facts label. Check the Nutrition Facts labels and try to choose products with at least 4 g dietary fiber per serving. Drink plenty of fluids. Set a goal of at least 8 cups per day. You may need even more fluid as you eat higher amounts of fiber. Fluid helps your body process fiber without discomfort. Foods Recommended Foods With at Least 4 g Fiber per Serving Food Group Choose  Grains ?- cup high-fiber cereal  Dried beans and peas  cup cooked red beans, kidney beans, large lima beans, navy beans, pinto beans, white beans, lentils, or black-eyed peas  Vegetables 1 artichoke (cooked)  Fruits  cup blackberries or raspberries 4 dried prunes   Foods With 1 to 3 g Fiber per Serving Food Group Choose  Grains 1 bagel (3.5-inch diameter) 1 slice whole wheat, cracked wheat, pumpernickel, or rye bread 2-inch square cornbread 4 whole wheat crackers 1 bran, blueberry, cornmeal, or English muffin  cup cereal with 1-3 g fiber per serving (check dietary fiber on the product's Nutrition Facts label) 2 tablespoons wheat germ or whole wheat flour  Fruits 1 apple (3-inch diameter) or  cup applesauce  cup apricots (canned) 1 banana  cup cherries (canned or fresh)  cup cranberries (fresh) 3 dates 2 medium figs (fresh)  cup fruit cocktail (canned)  grapefruit 1 kiwi fruit 1 orange (2-inch diameter) 1 peach (fresh) or  cup peaches (canned) 1 pear (fresh) or  cup pears (canned) 1 plum (2-inch diameter)  cup  raisins  cup strawberries (fresh) 1 tangerine  Vegetables  cup bean sprouts (raw)  cup beets (diced, canned)  cup broccoli, brussels sprouts, or cabbage  (cooked)  cup carrots  cup cauliflower  cup corn  cup eggplant  cup okra (boiled)  cup potatoes (baked or mashed)  cup spinach, kale, or turnip greens (cooked)  cup squash--winter, summer, or zucchini (cooked)  cup sweet potatoes or yams  cup tomatoes (canned)  Other 2 tablespoons almonds or peanuts 1 cup popcorn (popped)   High Fiber Vegetarian (Lacto-Ovo) Sample 1-Day Menu  Breakfast  cup bran cereal  1 banana  cup blueberries 1 cup 1% milk  Lunch 2 slices whole wheat bread  2 tablespoons hummus 1 ounce cheddar cheese 1 leaf lettuce 2 slices tomato  cup vegetarian baked beans 1 orange 1 cup 1% milk  Evening Meal Stir fry made with:  cup tempeh   cup brown rice 1 cup frozen broccoli 1 tablespoon soy sauce  cup peanuts  1 pear  Evening Snack 6 ounces fruit yogurt  1 cup air popped popcorn  High Fiber Vegan Sample 1-Day Menu  Breakfast  cup bran cereal  1 banana  cup blueberries 1 cup soymilk fortified with calcium , vitamin B12, and vitamin D   Lunch  cup chili with beans with:   cup tempeh crumbles  cup crushed whole wheat crackers 1 apple 1 cup soymilk fortified with calcium , vitamin B12, and vitamin D   Evening Meal 1 veggie burger  1 whole wheat bun  1 leaf lettuce  1 slice tomato Salad made with: 1 cup lettuce  cup chickpeas   cucumbers 1 tablespoon italian dressing 1 cup strawberries   Evening Snack  cup almonds  1 cup carrot sticks  High Fiber Sample 1-Day Menu  Breakfast 1/2 cup orange juice, with pulp  1/2 cup raisin bran 1 cup fat-free milk 1 cup coffee  Morning Snack 1 cup plain yogurt  2 cups water  Lunch 1 1/2 cups chili  1/2 cup kidney beans 1/2 cup soy crumble 2 tablespoons shredded cheese 8 whole wheat crackers 1 apple (with skin)   Evening Meal 2  ounces sliced chicken  1/4 cup tofu 2 cups mixed fresh vegetables 1 cup brown rice 1/2 cup strawberries 1 cup hot tea  Evening Snack 2 tablespoons almonds  1 cup hot chocolate   Copyright 2020  Academy [employer]

## 2024-09-13 NOTE — Progress Notes (Signed)
 Initial Nutrition Assessment  DOCUMENTATION CODES:   Non-severe (moderate) malnutrition in context of acute illness/injury  INTERVENTION:   -Ensure Plus High Protein po BID, each supplement provides 350 kcal and 20 grams of protein. -chilled per patient request  -Low Fiber and High Fiber handouts provided in AVS. Reviewed low fiber diet with patient.  NUTRITION DIAGNOSIS:   Moderate Malnutrition related to acute illness as evidenced by moderate fat depletion, mild muscle depletion, energy intake < 75% for > 7 days.  GOAL:   Patient will meet greater than or equal to 90% of their needs  MONITOR:   PO intake, Supplement acceptance  REASON FOR ASSESSMENT:   Consult Assessment of nutrition requirement/status, Diet education, Wound healing  ASSESSMENT:   68 year old female with history of anxiety, COPD, chronic hypoxic respiratory failure requiring 3 of oxygen  at baseline, diverticulosis, colon polyps who presented with urinary incontinence, left-sided abdominal pain. Abdomen/pelvis CT showed sigmoid diverticulitis complicated by a fistulous communication to the bladder with secondary cystitis.  Patient in room, reports can't really eat much at a time. Ate some cheerios with milk this morning. Reviewed low fiber diet with patient, answered all questions.  Added handouts to AVS for pt to review at home. Pt does not like vanilla supplements so will switch to chocolate Ensure Plus as tolerated. Pt denies any issues with swallowing or chewing.   Per weight records, pt has lost 7 lbs since 2/12 (5% wt loss x 9.5 months, insignificant for time frame).  Medications: Ferrous sulfate , Multivitamin with minerals daily, Fibercon  Labs reviewed.   NUTRITION - FOCUSED PHYSICAL EXAM:  Flowsheet Row Most Recent Value  Orbital Region Moderate depletion  Upper Arm Region Moderate depletion  Thoracic and Lumbar Region Mild depletion  Buccal Region Moderate depletion  Temple Region Mild  depletion  Clavicle Bone Region Mild depletion  Clavicle and Acromion Bone Region Mild depletion  Scapular Bone Region Mild depletion  Dorsal Hand Mild depletion  Patellar Region Mild depletion  Anterior Thigh Region Mild depletion  Posterior Calf Region Mild depletion  Edema (RD Assessment) None  Hair Reviewed  Eyes Reviewed  Mouth Reviewed  [missing some teeth]  Skin Reviewed  [bruising on BUEs]  Nails Reviewed    Diet Order:   Diet Order             DIET SOFT Room service appropriate? Yes; Fluid consistency: Thin  Diet effective now                   EDUCATION NEEDS:   Education needs have been addressed  Skin:  Skin Assessment: Reviewed RN Assessment  Last BM:  11/28  Height:   Ht Readings from Last 1 Encounters:  09/11/24 5' 2 (1.575 m)    Weight:   Wt Readings from Last 1 Encounters:  09/11/24 57.6 kg     BMI:  Body mass index is 23.23 kg/m.  Estimated Nutritional Needs:   Kcal:  1500-1700  Protein:  70-80g  Fluid:  1.7L/day   Morna Lee, MS, RD, LDN Inpatient Clinical Dietitian Contact via Secure chat

## 2024-09-13 NOTE — Consult Note (Signed)
 NAME:  Lynn Kline, MRN:  [id number], DOB:  [birth year], LOS: 11 ADMISSION DATE:  09/02/2024, CONSULTATION DATE:  09/13/2024  REFERRING MD:  Sheldon, CCS, CHIEF COMPLAINT: Pulmonary clearance and optimization prior to surgery  History of Present Illness:  68 year old with severe COPD and chronic respiratory failure with hypoxia on 3 L O2 who was admitted with diverticulitis and UTI suspicious for colovesical fistula.  She is being treated for diverticulitis flare with antibiotics and plan is for colonoscopy as an outpatient followed by elective colectomy for fistula takedown over the next 1 to 2 months.  PCCM was consulted for pulmonary clearance and evaluation of surgical risk.  She was last seen on a televisit 06/2024.  She quit smoking 2 years ago but admits to  once in a while slipping up.  Her last exam was 3 months ago.  She is maintained on a regimen of Breztri  and albuterol  nebs for breakthrough.  Last exacerbation was February and April 2024 requiring ED visits.  She is treated with oxygen  during sleep and uses POC up to 4 L on occasion on ambulation.  She underwent low-dose CT chest 07/2024 which picked up on a 6 mm nodule left upper lobe and 47-month follow-up CT has been advised Prior to this hospitalization she could walk about 100 yards at slow pace, she admits to sedentary lifestyle.  She is still working from home  Pertinent  Medical History  Severe COPD FEV1 51% 2020, DLCO 73% Kyphosis with compression fracture Iron deficiency anemia  Significant Hospital Events: Including procedures, antibiotic start and stop dates in addition to other pertinent events     Interim History / Subjective:  Denies abdominal pain Out of bed to chair  Objective    Blood pressure 139/82, pulse 93, temperature 98.9 F (37.2 C), resp. rate 18, height 5' 2 (1.575 m), weight 57.6 kg, SpO2 99%.       No intake or output data in the 24 hours ending 09/13/24 1319 Filed Weights   09/11/24  2058  Weight: 57.6 kg    Examination: General: Elderly woman on nasal cannula, no distress HENT: Mild pallor, no icterus, no JVD Lungs: Decreased breath sounds bilateral, no accessory muscle use Cardiovascular: S1-S2 regular, no murmur Abdomen: Soft, nontender Extremities: 1+ edema Neuro: Alert, interactive, nonfocal   Labs show stable anemia, no leukocytosis, normal electrolytes  Resolved problem list   Assessment and Plan   COPD, preop Chronic respiratory failure with hypoxia Smoker  -Complete smoking cessation advised - Continue current regimen of Breztri  with albuterol  nebs for breakthrough - She may benefit from pulmonary rehab program as outpatient but does not seem to be too motivated - Otherwise, she is at her baseline lung function.  She is clearly at high risk of postop complications from abdominal surgery including pneumonia, atelectasis, worsening hypoxia.  Laparoscopic approach may help to minimize dose.  She will need postop monitoring and early mobilization as would be routine for such a procedure  Pedal edema -unclear if this is related to hospitalization or cor pulmonale Lasix 20 mg daily for 3 days  Left upper lobe new nodule noted on screening CT - 4-month follow-up CT chest will be scheduled in January  We will schedule outpatient pulmonary follow-up, she will need a new provider at [facility] will be available as needed  Labs   CBC: Recent Labs  Lab 09/08/24 1130 09/09/24 0413 09/10/24 0423 09/11/24 0451 09/12/24 0433  WBC 12.9* 9.7 8.6 8.5 9.0  NEUTROABS 9.9*  6.6 5.5 5.4 5.3  HGB 10.3* 9.5* 9.3* 9.4* 9.9*  HCT 33.1* 30.4* 29.9* 30.1* 31.3*  MCV 93.8 93.0 92.0 91.8 91.8  PLT 464* 405* 444* 447* 468*    Basic Metabolic Panel: Recent Labs  Lab 09/11/24 0451  NA 137  K 4.3  CL 99  CO2 33*  GLUCOSE 92  BUN <5*  CREATININE 0.46  CALCIUM  8.4*   GFR: Estimated Creatinine Clearance: 52.5 mL/min (by C-G formula based on SCr of  0.46 mg/dL). Recent Labs  Lab 09/09/24 0413 09/10/24 0423 09/11/24 0451 09/12/24 0433  WBC 9.7 8.6 8.5 9.0    Liver Function Tests: No results for input(s): AST, ALT, ALKPHOS, BILITOT, PROT, ALBUMIN in the last 168 hours. No results for input(s): LIPASE, AMYLASE in the last 168 hours. No results for input(s): AMMONIA in the last 168 hours.  ABG    Component Value Date/Time   HCO3 32.8 (H) 04/04/2023 1635   O2SAT 83 04/04/2023 1635     Coagulation Profile: No results for input(s): INR, PROTIME in the last 168 hours.  Cardiac Enzymes: No results for input(s): CKTOTAL, CKMB, CKMBINDEX, TROPONINI in the last 168 hours.  HbA1C: Hgb A1c MFr Bld  Date/Time Value Ref Range Status  11/29/2023 03:09 PM 5.8 4.6 - 6.5 % Final    Comment:    Glycemic Control Guidelines for People with Diabetes:Non Diabetic:  <6%Goal of Therapy: <7%Additional Action Suggested:  >8%   04/14/2023 02:31 PM 6.0 4.6 - 6.5 % Final    Comment:    Glycemic Control Guidelines for People with Diabetes:Non Diabetic:  <6%Goal of Therapy: <7%Additional Action Suggested:  >8%     CBG: Recent Labs  Lab 09/07/24 1726 09/08/24 0805 09/08/24 1225 09/08/24 1759 09/08/24 2131  GLUCAP 108* 135* 81 112* 119*    Review of Systems:   Constitutional: negative for anorexia, fevers and sweats  Eyes: negative for irritation, redness and visual disturbance  Ears, nose, mouth, throat, and face: negative for earaches, epistaxis, nasal congestion and sore throat  Respiratory: negative for cough,  sputum and wheezing  Cardiovascular: negative for chest pain, dyspnea, lower extremity edema, orthopnea, palpitations and syncope  Gastrointestinal: negative for abdominal pain, constipation, diarrhea, melena, nausea and vomiting  Genitourinary:negative for dysuria, frequency and hematuria  Hematologic/lymphatic: negative for bleeding, easy bruising and lymphadenopathy  Musculoskeletal:negative for  arthralgias, muscle weakness and stiff joints  Neurological: negative for coordination problems, gait problems, headaches and weakness  Endocrine: negative for diabetic symptoms including polydipsia, polyuria and weight loss   Past Medical History:  She,  has a past medical history of Anxiety, Atherosclerosis, Colon polyp, hyperplastic, COPD (chronic obstructive pulmonary disease) (HCC), Diverticulosis, Gallstone, and Pneumonia.   Surgical History:   Past Surgical History:  Procedure Laterality Date   ABDOMINAL HYSTERECTOMY     BRONCHIAL BIOPSY  11/03/2020   Procedure: BRONCHIAL BIOPSIES;  Surgeon: Brenna Adine CROME, DO;  Location: [facility];  Service: Pulmonary;;   BRONCHIAL BRUSHINGS  11/03/2020   Procedure: BRONCHIAL BRUSHINGS;  Surgeon: Brenna Adine CROME, DO;  Location: [facility];  Service: Pulmonary;;   BRONCHIAL NEEDLE ASPIRATION BIOPSY  11/03/2020   Procedure: BRONCHIAL NEEDLE ASPIRATION BIOPSIES;  Surgeon: Brenna Adine CROME, DO;  Location: [facility];  Service: Pulmonary;;   BRONCHIAL WASHINGS  11/03/2020   Procedure: BRONCHIAL WASHINGS;  Surgeon: Brenna Adine CROME, DO;  Location: [facility];  Service: Pulmonary;;   CESAREAN SECTION     CHOLECYSTECTOMY     COLONOSCOPY     FIDUCIAL MARKER PLACEMENT  11/03/2020   Procedure: FIDUCIAL MARKER PLACEMENT;  Surgeon: Brenna Adine CROME, DO;  Location: [facility];  Service: Pulmonary;;   VIDEO BRONCHOSCOPY WITH ENDOBRONCHIAL NAVIGATION N/A 11/03/2020   Procedure: VIDEO BRONCHOSCOPY WITH ENDOBRONCHIAL NAVIGATION AND POSSIBLE ULTRASOUND;  Surgeon: Brenna Adine CROME, DO;  Location: [facility];  Service: Pulmonary;  Laterality: N/A;   VIDEO BRONCHOSCOPY WITH ENDOBRONCHIAL ULTRASOUND  11/03/2020   Procedure: VIDEO BRONCHOSCOPY WITH ENDOBRONCHIAL ULTRASOUND;  Surgeon: Brenna Adine CROME, DO;  Location: [facility];  Service: Pulmonary;;     Social History:   reports that she quit smoking about 2 years ago. Her smoking use included cigarettes.  She has been exposed to tobacco smoke. She has never used smokeless tobacco. She reports that she does not drink alcohol and does not use drugs.   Family History:  Her family history includes Arthritis in her mother; Breast cancer in her maternal grandmother; COPD in her mother; Cancer in her father; Emphysema in her mother; Heart disease in her father; Stroke in her father. There is no history of Colon cancer.   Allergies Allergies  Allergen Reactions   Codeine Nausea And Vomiting   Erythromycin Nausea And Vomiting   Ampicillin Nausea And Vomiting, Rash and Other (See Comments)    Pt states that rash was on her tongue.  Has patient had a PCN reaction causing immediate rash, facial/tongue/throat swelling, SOB or lightheadedness with hypotension yes Has patient had a PCN reaction causing severe rash involving mucus membranes or skin necrosis: no Has patient had a PCN reaction that required hospitalization yes Has patient had a PCN reaction occurring within the last 10 years: no If all of the above answers are NO, then may proceed with Cephalosporin use.      Home Medications  Prior to Admission medications   Medication Sig Start Date End Date Taking? Authorizing Provider  albuterol  (PROVENTIL ) (2.5 MG/3ML) 0.083% nebulizer solution USE 3 ML VIA NEBULIZER EVERY 4 HOURS AS NEEDED FOR WHEEZING OR SHORTNESS OF BREATH Patient taking differently: Take 2.5 mg by nebulization every 4 (four) hours as needed for wheezing or shortness of breath. 05/27/24  Yes Joshua Debby CROME, MD  ALPRAZolam  (XANAX ) 0.25 MG tablet TAKE 1 TABLET(0.25 MG) BY MOUTH TWICE DAILY AS NEEDED Patient taking differently: Take 0.25 mg by mouth See admin instructions.  Take 0.25 mg by mouth in the morning and an additional 0.25 mg once a day as needed for anxiety 07/08/24  Yes Joshua Debby CROME, MD  budesonide -glycopyrrolate -formoterol  (BREZTRI  AEROSPHERE) 160-9-4.8 MCG/ACT AERO inhaler Inhale 2 puffs into the lungs in the morning  and at bedtime. 04/12/24  Yes Cobb, Comer GAILS, NP  cholecalciferol (VITAMIN D3) 25 MCG (1000 UNIT) tablet Take 1,000 Units by mouth daily.   Yes [provider]  cyanocobalamin  2000 MCG tablet Take 1 tablet (2,000 mcg total) by mouth daily. 07/18/24  Yes Joshua Debby CROME, MD  naproxen sodium (ALEVE) 220 MG tablet Take 220 mg by mouth 2 (two) times daily as needed (for mild pain or headaches).   Yes [provider]  OXYGEN  Inhale 3 L/min into the lungs continuous.   Yes [provider]  rosuvastatin  (CRESTOR ) 10 MG tablet Take 1 tablet (10 mg total) by mouth daily. Patient taking differently: Take 10 mg by mouth at bedtime. 07/17/24  Yes Joshua Debby CROME, MD  VENTOLIN  HFA 108 (90 Base) MCG/ACT inhaler INHALE 1 TO 2 PUFFS INTO THE LUNGS EVERY 6 HOURS AS NEEDED FOR WHEEZING OR SHORTNESS OF BREATH 07/05/24  Yes Joshua,  Debby CROME, MD       Harden Staff MD. FCCP. [facility] Pager : [phone]  If no response to pager , please call [phone] until 7 pm After 7:00 pm call Elink  [phone]   09/13/2024

## 2024-09-13 NOTE — Progress Notes (Signed)
 PROGRESS NOTE  DARCEL ZICK  FMW:[id number] DOB: [birth year] DOA: 09/02/2024 PCP: [name], MD   Brief Narrative: Patient is a 68 year old female with history of anxiety, COPD, chronic hypoxic respiratory failure requiring 3 of oxygen  at baseline, diverticulosis, colon polyps who presented with urinary incontinence, left-sided abdominal pain.  UA was suspicious for UTI.  Urine culture grew multiple species.  Abdomen/pelvis CT showed sigmoid diverticulitis complicated by a fistulous communication to the bladder with secondary cystitis.  Consulted GI, plan for colonoscopy as an outpatient.  General surgery following. Consulted  urology as well .currently on ceftriaxone , Flagyl , plan for 2 weeks of antibiotics course during this hospitalization, day 12/14  Assessment & Plan:  Principal Problem:   Colonic fistula Active Problems:   Diverticulitis  Sigmoid diverticulitis/colovesical fistula: Presented with abdominal pain, urinary in continence. UA was suspicious for UTI.  Urine culture grew multiple species.  Abdomen/pelvis CT on 11/23 showed sigmoid diverticulitis complicated by a fistulous communication to the bladder with secondary cystitis. CT pelvis without contrast on 11/24 showed active sigmoid diverticulitis, minimally improved, with associated soft tissue thickening extending to the left bladder dome consistent with a fistula,Cystitis with trace intravesical air,mild left ureteral distention extending down to the  inflammatory changes,Inflammatory changes surrounding the left ovary without evidence of tubo-ovarian abscess. General surgery recommended to obtain urology consultation.  Dr. Carolee saw her on 11/25.  Recommended no intervention for now.  Recommended renal ultrasound in couple of weeks and cystoscopy as an outpatient Plan to continue 2 weeks of IV antibiotic therapy during this hospitalization.  Currently on Rocephin , Flagyl .  Day 12 /14. General surgery plan to follow-up  as an outpatient with colorectal specialist after colonoscopy. Currently on soft diet and she is tolerating.  Foley has been removed  Colovesical fistula: GI planning to perform colonoscopy in January to reevaluate prior to consideration of surgery.   Chronic hypoxic respiratory failure/COPD: Continue bronchodilators as needed.  On 3 L of oxygen  at home at baseline.  Hyperlipidemia: Takes Crestor  at home  Iron deficiency anemia: Current hemoglobin in the range of 9.  Iron studies showed low iron.  Continue iron supplementation  Left wrist pain: Left wrist slightly tender.  Could be thrombophlebitis.  Ultrasound pending.  Apply cool compress, NSAIDs/Tylenol   Deconditioning: Patient lives with her dog.  Ambulatory at baseline.  Will consult PT       DVT prophylaxis:enoxaparin  (LOVENOX ) injection 40 mg Start: 09/08/24 1000 SCDs Start: 09/02/24 1946     Code Status: Full Code  Family Communication: None at bedside  Patient status:Inpatient  Patient is from :home  Anticipated discharge un:ynfz  Estimated DC date:after finishing IV antibiotics,after general surgery clearance.  Likely on Sunday   Consultants: General surgery, GI  Procedures: None yet  Antimicrobials:  Anti-infectives (From admission, onward)    Start     Dose/Rate Route Frequency Ordered Stop   09/03/24 1600  cefTRIAXone (ROCEPHIN) 2 g in sodium chloride 0.9 % 100 mL IVPB        2 g 200 mL/hr over 30 Minutes Intravenous Every 24 hours 09/02/24 1944     09/03/24 0800  metroNIDAZOLE (FLAGYL) IVPB 500 mg        500 mg 100 mL/hr over 60 Minutes Intravenous Every 12 hours 09/02/24 1944     09/02/24 1715  metroNIDAZOLE (FLAGYL) IVPB 500 mg        50 0 mg 100 mL/hr over 60 Minutes Intravenous  Once 09/02/24 1710 09/02/24 1950   09/02/24 1530  cefTRIAXone  (  ROCEPHIN ) 1 g in sodium chloride  0.9 % 100 mL IVPB        1 g 200 mL/hr over 30 Minutes Intravenous  Once 09/02/24 1529 09/02/24 1659        Subjective: Patient seen and examined at bedside today.  Hemodynamically stable and overall comfortable.  Tolerating soft diet.  Still has lower abdominal pain but tolerable.  No vomiting or nausea.  Complains of left wrist pain today  Objective: Vitals:   09/12/24 2009 09/12/24 2207 09/13/24 0658 09/13/24 0755  BP: 124/68  139/82   Pulse: 95  93   Resp: 18  18   Temp: 98.3 F (36.8 C)  98.9 F (37.2 C)   TempSrc:      SpO2: 93% 96% 99% 99%  Weight:      Height:       No intake or output data in the 24 hours ending 09/13/24 1048  Filed Weights   09/11/24 2058  Weight: 57.6 kg    Examination:   General exam: Overall comfortable, not in distress HEENT: PERRL Respiratory system:  no wheezes or crackles  Cardiovascular system: S1 & S2 heard, RRR.  Gastrointestinal system: Abdomen is nondistended, soft .  Mild tenderness on the suprapubic region/left lower quadrant Central nervous system: Alert and oriented Extremities: Mild edema of the left wrist, no clubbing ,no cyanosis, bilateral lower extremity edema Skin: No rashes, no ulcers,no icterus         Data Reviewed: I have personally reviewed following labs and imaging studies  CBC: Recent Labs  Lab 09/08/24 1130 09/09/24 0413 09/10/24 0423 09/11/24 0451 09/12/24 0433  WBC 12.9* 9.7 8.6 8.5 9.0  NEUTROABS 9.9* 6.6 5.5 5.4 5.3  HGB 10.3* 9.5* 9.3* 9.4* 9.9*  HCT 33.1* 30.4* 29.9* 30.1* 31.3*  MCV 93.8 93.0 92.0 91.8 91.8  PLT 464* 405* 444* 447* 468*   Basic Metabolic Panel: Recent Labs  Lab 09/11/24 0451  NA 137  K 4.3  CL 99  CO2 33*  GLUCOSE 92  BUN <5*  CREATININE 0.46  CALCIUM  8.4*     No results found for this or any previous visit (from the past 240 hours).    Radiology Studies: No results found.   Scheduled Meds:  albuterol   2.5 mg Nebulization BID   budesonide -glycopyrrolate -formoterol   2 puff Inhalation BID AC & HS   Chlorhexidine  Gluconate Cloth  6 each Topical Daily    enoxaparin  (LOVENOX ) injection  40 mg Subcutaneous Q24H   feeding supplement  237 mL Oral BID BM   gabapentin   200 mg Oral QHS   multivitamins with iron  1 tablet Oral Daily   polycarbophil  625 mg Oral BID   sodium chloride  flush  3 mL Intravenous Q12H   Continuous Infusions:  cefTRIAXone  (ROCEPHIN )  IV 2 g (09/12/24 1652)   metronidazole  500 mg (09/12/24 2147)     LOS: 11 days   Ivonne Mustache, MD [facility] P11/28/2025, 10:48 AM

## 2024-09-13 NOTE — Progress Notes (Addendum)
 09/13/2024  Lynn GUINTHER [id number] [birth year]  CARE TEAM: PCP: [name], MD  Outpatient Care Team: Patient Care Team: [name], MD as PCP - General (Internal Medicine) [name], [name], [facility] (Inactive) as Pharmacist (Pharmacist) [name], DO as Consulting Physician (Pulmonary Disease) [name], MD as Consulting Physician (Gastroenterology)  Inpatient Treatment Team: Treatment Team:  Jillian Buttery, MD Ccs, Md, MD [name], MD Bobbette Pinon, MD Casimir Camelia RAMAN, RPH Wishon, Marry BRAVO, [place] Kriste Asberry BRAVO, RN Raynaldo Vina GAILS, RN Cloud, Ole LOISE Doneta Glenys ONEIDA, RN Arloa Folks D, NP   Problem List:   Principal Problem:   Colovesical fistula Active Problems:   Chronic respiratory failure with hypoxia (HCC)   COPD with asthma (HCC)   Tobacco abuse   IBS (irritable bowel syndrome)   Coronary atherosclerosis due to calcified coronary lesion   Prediabetes   GAD (generalized anxiety disorder)   Diverticulitis   * No surgery found *      Assessment [facility] Stay = 11 days)      Colovesical fistula slowly improving.    Plan:  Diverticulitis with gas and UTI suspicious for colovesical fistula.  There is no frank extraluminal gas or abscess.  Continue antibiotics.  Most likely can transition to oral antibiotics on discharge x 5 more days with low threshold to continue longer if needed.  Solid diet as tolerated.  Bowel regimen.  CT scan if worse to rule out abscess formation or other concerns.  Seems less likely now.  At some point the patient would benefit from sigmoid colectomy and takedown of colovesical fistula.  Ideally would wait for this current flare to calm down and then plan an interval colonoscopy and then robotic colectomy colovesical or fistula takedown.  Usually plan around 6 weeks after discharge.  Hopefully that would lower the risk of needing numerous operations with temporary  colostomy's.  Discussed at bedside with the patient.  She has not seen a colorectal surgeon in our group yet. The anatomy & physiology of the digestive tract was discussed.  The pathophysiology of  fistula between the bowel and bladder was discussed.  Natural history risks without surgery was discussed. I worked to give an overview of the disease and the frequent need to have multispecialty involvement.   I feel the risks of no intervention will lead to serious problems that outweigh the operative risks; therefore, I recommended surgery to treat the pathology.  Laparoscopic & open techniques for partial proctocolectomy with bladder repair were discussed.  Possible fecal diversion by ostomy was discussed.  We will work to preserve anal & pelvic floor function without sacrificing cure.  Need for prolonged bladder catheterization was discussed.  Risks such as bleeding, infection, abscess, leak, injury to other organs, need for repair of tissues / organs, recurrence with reoperation, possible ostomy, hernia, heart attack, death, and other risks were discussed.  I noted a good likelihood this will help address the problem.   Goals of post-operative recovery were discussed as well.  We will work to minimize complications.  An educational handout on the pathology was given as well.  Questions were answered.    The patient expresses understanding & wishes to proceed with surgery.  Seen by [facility].  She has not had a colonoscopy over a decade & knows that she is overdue.  Last time was Dr. Obie who has been retired for quite some time.  I think there is a tentative plan for  considering colonoscopy by Dr. Stacia.  Will have to be in a hospital setting given her COPD.  See if we can coordinate this so she gets a bowel prep, colonoscopy, then surgery the next day.  Most likely in January.  With her COPD oxygen  dependency, I think would be wise to get pulmonary clearance or at least evaluation  to assess the risks since I worry they will be above average.  Her performance status is not terrible but not great.  Do not know if she can be optimized or not.  Pulmonary team aware and will try and see her while she is in house to at least start the process  Seen by urology.  Suspect mild hydronephrosis is due to the colovesical fistula most likely due to diverticulitis.  She does not have worsening renal function which is.  Will want ureteral/bladder ICG Firefly at the time of robotic surgery.  If needs urgent open case, would lean towards stents.    Patient concerned about diet tolerances since she feels like meat does not go well and is worried about other foods.  Will see if nutrition can come by and offer some insights.  I tried to stressed the importance of making sure he has protein in in vitamins to recover and heal and get through a surgery.  I gave her a chance to vent her numerous concerns.  I think she is worried about funding.  She is wearing about support.  She is worried about her other health issues.  Tried to allow her to have some support.  Will ask if case management can help with that.  -monitor electrolytes & replace as needed  Keep K>4, Mg>2, Phos>3  -VTE prophylaxis- SCDs.  Anticoagulation prophyllaxis SQ as appropriate  -mobilize as tolerated to help recovery.  Enlist therapies in moderate/high risk patients as appropriate  I updated the patient's status to the patient  Recommendations were made.  Questions were answered.  She expressed understanding & appreciation.  -Disposition: TBD    I reviewed nursing notes, Consultant GI notes, hospitalist notes, last 24 h vitals and pain scores, last 48 h intake and output, last 24 h labs and trends, and last 24 h imaging results.  I have reviewed this patient's available data, including medical history, events of note, test results, etc as part of my evaluation.   A significant portion of that time was spent in  counseling. Care during the described time interval was provided by me.  This care required high  level of medical decision making.  09/13/2024    Subjective: (Chief complaint)  Patient sitting up trying some food.  Worried about eating meats and proteins.  Did not tolerate turkey well.  Some soreness but not worse.  No severe dysuria.  Worried about cost of surgery and hospital stay.  Worried about insurance coverage.  Objective:  Vital signs:  Vitals:   09/12/24 2009 09/12/24 2207 09/13/24 0658 09/13/24 0755  BP: 124/68  139/82   Pulse: 95  93   Resp: 18  18   Temp: 98.3 F (36.8 C)  98.9 F (37.2 C)   TempSrc:      SpO2: 93% 96% 99% 99%  Weight:      Height:        Last BM Date : 09/13/24  Intake/Output   Yesterday:  11/27 0701 - 11/28 0700 In: 10 [I.V.:10] Out: -  This shift:  No intake/output data recorded.  Bowel function:  Flatus: YES  BM:  YES  Drain: (No drain)   Physical Exam:  General: Pt awake/alert in no acute distress Eyes: PERRL, normal EOM.  Sclera clear.  No icterus Neuro: CN II-XII intact w/o focal sensory/motor deficits. Lymph: No head/neck/groin lymphadenopathy Psych: Mildly anxious but mostly consolable no delerium/psychosis/paranoia.  Oriented x 4 HENT: Normocephalic, Mucus membranes moist.  No thrush Neck: Supple, No tracheal deviation.  No obvious thyromegaly Chest: No pain to chest wall compression.  Good respiratory excursion.  No audible wheezing CV:  Pulses intact.  Regular rhythm.  No major extremity edema MS: Normal AROM mjr joints.  No obvious deformity  Abdomen: Soft.  Nondistended.  Tenderness at suprapubic region mild.  No guarding..  No evidence of peritonitis.  No incarcerated hernias.  Ext:  No deformity.  No mjr edema.  No cyanosis Skin: No petechiae / purpurea.  No major sores.  Warm and dry    Results:   Cultures: Recent Results (from the past 720 hours)  Urine Culture     Status: Abnormal    Collection Time: 09/02/24 11:03 AM   Specimen: Urine, Random  Result Value Ref Range Status   Specimen Description   Final    URINE, RANDOM Performed at [facility], [address], [place], [place] [address]    Special Requests   Final    NONE Reflexed from [id number] Performed at [facility], [address], [place], [place] [address]    Culture MULTIPLE SPECIES PRESENT, SUGGEST RECOLLECTION (A)  Final   Report Status 09/03/2024 FINAL  Final    Labs: Results for orders placed or performed during the hospital encounter of 09/02/24 (from the past 48 hours)  CBC with Differential/Platelet     Status: Abnormal   Collection Time: 09/12/24  4:33 AM  Result Value Ref Range   WBC 9.0 4.0 - 10.5 K/uL   RBC 3.41 (L) 3.87 - 5.11 MIL/uL   Hemoglobin 9.9 (L) 12.0 - 15.0 g/dL   HCT 68.6 (L) 63.9 - 53.9 %   MCV 91.8 80.0 - 100.0 fL   MCH 29.0 26.0 - 34.0 pg   MCHC 31.6 30.0 - 36.0 g/dL   RDW 86.1 88.4 - 84.4 %   Platelets 468 (H) 150 - 400 K/uL   nRBC 0.0 0.0 - 0.2 %   Neutrophils Relative % 58 %   Neutro Abs 5.3 1.7 - 7.7 K/uL   Lymphocytes Relative 29 %   Lymphs Abs 2.6 0.7 - 4.0 K/uL   Monocytes Relative 8 %   Monocytes Absolute 0.7 0.1 - 1.0 K/uL   Eosinophils Relative 4 %   Eosinophils Absolute 0.3 0.0 - 0.5 K/uL   Basophils Relative 1 %   Basophils Absolute 0.1 0.0 - 0.1 K/uL   Immature Granulocytes 0 %   Abs Immature Granulocytes 0.02 0.00 - 0.07 K/uL    Comment: Performed at [facility], [address], [place], [place] [address]    Imaging / Studies: No results found.  Medications / Allergies: per chart  Antibiotics: Anti-infectives (From admission, onward)    Start     Dose/Rate Route Frequency Ordered Stop   09/03/24 1600  cefTRIAXone  (ROCEPHIN ) 2 g in sodium chloride  0.9 % 100 mL IVPB        2 g 200 mL/hr over 30 Minutes Intravenous Every 24 hours 09/02/24 1944     09/03/24 0800  metroNIDAZOLE  (FLAGYL )  IVPB 500 mg        500 mg 100 mL/hr over  60 Minutes Intravenous Every 12 hours 09/02/24 1944     09/02/24 1715  metroNIDAZOLE  (FLAGYL ) IVPB 500 mg        500 mg 100 mL/hr over 60 Minutes Intravenous  Once 09/02/24 1710 09/02/24 1950   09/02/24 1530  cefTRIAXone  (ROCEPHIN ) 1 g in sodium chloride  0.9 % 100 mL IVPB        1 g 200 mL/hr over 30 Minutes Intravenous  Once 09/02/24 1529 09/02/24 1659         Note: Portions of this report may have been transcribed using voice recognition software. Every effort was made to ensure accuracy; however, inadvertent computerized transcription errors may be present.   Any transcriptional errors that result from this process are unintentional.    Elspeth KYM Schultze, MD, FACS, MASCRS Esophageal, Gastrointestinal & Colorectal Surgery Robotic and Minimally Invasive Surgery  [facility] [address], [address] [place], [place] [address] [phone] Fax [phone] Main  CONTACT INFORMATION: Weekday (9AM-5PM): Call [facility] main office at [phone] Weeknight (5PM-9AM) or Weekend/Holiday: Check EPIC Web Links tab & use AMION (password  [email or web address]) for General Surgery [facility] coverage  Please, DO NOT use SecureChat  (it is not reliable communication to reach operating surgeons & will lead to a delay in care).   Epic staff messaging available for outpatient concerns needing 1-2 business day response.      09/13/2024  11:31 AM

## 2024-09-13 NOTE — Progress Notes (Signed)
 Mobility Specialist Progress Note:  [place] 3 L Pre-mobility: 99% SpO2 Post-mobility: 97% SPO2   09/13/24 1325  Mobility  Activity Ambulated with assistance  Level of Assistance Contact guard assist, steadying assist  Assistive Device Front wheel walker  Distance Ambulated (ft) 375 ft  Activity Response Tolerated well  Mobility Referral Yes  Mobility visit 1 Mobility  Mobility Specialist Start Time (ACUTE ONLY) 1135  Mobility Specialist Stop Time (ACUTE ONLY) 1153  Mobility Specialist Time Calculation (min) (ACUTE ONLY) 18 min   Pt was received in recliner and agreed to mobility. No complaints during ambulation. Returned to recliner with all needs met. Call bell in reach.   [employer] - Mobility Specialist

## 2024-09-13 NOTE — Progress Notes (Signed)
 Mobility Specialist Progress Note:  [place] 3 L   09/13/24 1610  Mobility  Activity Ambulated with assistance  Level of Assistance Standby assist, set-up cues, supervision of patient - no hands on  Assistive Device Front wheel walker  Distance Ambulated (ft) 375 ft  Activity Response Tolerated well  Mobility Referral Yes  Mobility visit 1 Mobility  Mobility Specialist Start Time (ACUTE ONLY) 1548  Mobility Specialist Stop Time (ACUTE ONLY) 1559  Mobility Specialist Time Calculation (min) (ACUTE ONLY) 11 min   Pt was received in recliner and agreed to mobility. No complaints during ambulation. Returned to bed with all needs met. Call bell in reach.  [employer] - Mobility Specialist

## 2024-09-13 NOTE — Telephone Encounter (Signed)
 Please make hosp follow-up appointment in 4 to 6 weeks at [facility] with new provider/APP for this patient, previous Icard patient

## 2024-09-13 NOTE — Plan of Care (Signed)
   Problem: Education: Goal: Knowledge of General Education information will improve Description: Including pain rating scale, medication(s)/side effects and non-pharmacologic comfort measures Outcome: Progressing   Problem: Clinical Measurements: Goal: Will remain free from infection Outcome: Progressing   Problem: Clinical Measurements: Goal: Diagnostic test results will improve Outcome: Progressing

## 2024-09-14 ENCOUNTER — Inpatient Hospital Stay (HOSPITAL_COMMUNITY)

## 2024-09-14 DIAGNOSIS — N321 Vesicointestinal fistula: Secondary | ICD-10-CM | POA: Diagnosis not present

## 2024-09-14 MED ORDER — IBUPROFEN 200 MG PO TABS
400.0000 mg | ORAL_TABLET | Freq: Four times a day (QID) | ORAL | Status: DC | PRN
  Administered 2024-09-14 – 2024-09-16 (×4): 400 mg via ORAL
  Filled 2024-09-14 (×4): qty 2

## 2024-09-14 NOTE — Progress Notes (Signed)
   Subjective/Chief Complaint: No new complaints   Objective: Vital signs in last 24 hours: Temp:  [98.3 F (36.8 C)-98.5 F (36.9 C)] 98.5 F (36.9 C) (11/29 0440) Pulse Rate:  [90-110] 110 (11/29 0440) Resp:  [18-20] 20 (11/29 0440) BP: (107-129)/(64-82) 107/72 (11/29 0440) SpO2:  [96 %-100 %] 96 % (11/29 0440) Last BM Date : 09/14/24  Intake/Output from previous day: No intake/output data recorded. Intake/Output this shift: No intake/output data recorded.  Exam: Awake and alert Comfortable Abdomen soft, minimally tender  Lab Results:  Recent Labs    09/12/24 0433  WBC 9.0  HGB 9.9*  HCT 31.3*  PLT 468*   BMET No results for input(s): NA, K, CL, CO2, GLUCOSE, BUN, CREATININE, CALCIUM  in the last 72 hours. PT/INR No results for input(s): LABPROT, INR in the last 72 hours. ABG No results for input(s): PHART, HCO3 in the last 72 hours.  Invalid input(s): PCO2, PO2  Studies/Results: US  LT UPPER EXTREM LTD SOFT TISSUE NON VASCULAR Result Date: 09/13/2024 EXAM: US  LEFT UPPER EXTREMITY NONVASCULAR SOFT TISSUE 09/13/2024 05:56:11 PM TECHNIQUE: Real-time ultrasound scan of the left upper extremity with image documentation. COMPARISON: None available. CLINICAL HISTORY: [id number] Wrist swelling, left [id number] FINDINGS: SOFT TISSUES: No fluid collection to indicate a drainable abscess. No discrete mass. Vessels appear patent. IMPRESSION: 1. No fluid collection or mass. Electronically signed by: Norman Gatlin MD 09/13/2024 08:52 PM EST RP Workstation: [id number]    Anti-infectives: Anti-infectives (From admission, onward)    Start     Dose/Rate Route Frequency Ordered Stop   09/03/24 1600  cefTRIAXone  (ROCEPHIN ) 2 g in sodium chloride  0.9 % 100 mL IVPB        2 g 200 mL/hr over 30 Minutes Intravenous Every 24 hours 09/02/24 1944     09/03/24 0800  metroNIDAZOLE  (FLAGYL ) IVPB 500 mg        500 mg 100 mL/hr over 60 Minutes Intravenous Every  12 hours 09/02/24 1944     09/02/24 1715  metroNIDAZOLE  (FLAGYL ) IVPB 500 mg        500 mg 100 mL/hr over 60 Minutes Intravenous  Once 09/02/24 1710 09/02/24 1950   09/02/24 1530  cefTRIAXone  (ROCEPHIN ) 1 g in sodium chloride  0.9 % 100 mL IVPB        1 g 200 mL/hr over 30 Minutes Intravenous  Once 09/02/24 1529 09/02/24 1659       Assessment/Plan: Sigmoid diverticulitis with colovesical fistula  -plan one more day of IV antibiotics prior to discharge -pt now evaluated by pulmonary -plans for colorectal surgery follow up in place -we will sign off for now and see again as an outpt unless problems arise     Lynn Kline 09/14/2024

## 2024-09-14 NOTE — Evaluation (Signed)
 Occupational Therapy Evaluation Patient Details Name: Lynn Kline MRN: [id number] DOB: [birth year] Today's Date: 09/14/2024   History of Present Illness   68 yr old female who presented 09/02/24 with abdominal pain. She was found to have sigmoid diverticulitis vs. colovesical fistula. PMH: anxiety, COPD, diverticulosis, emphysema     Clinical Impressions During the session today, the pt performed all assessed tasks with modified independence or better, including sit to stand, lower body dressing, and functional ambulation in the hall without an assistive device. Her O2 saturation was noted to be 95% on 3L at rest and 91% on 3L with activity. OT educated her on energy conservation strategies to implement as needed during household IADLs, as she had difficulty in this regard recently. All needed OT education and/or intervention was provided during the session & she does not require further OT services. OT will sign off and recommend she return home at discharge.      If plan is discharge home, recommend the following:   Assistance with cooking/housework     Functional Status Assessment   Patient has not had a recent decline in their functional status     Equipment Recommendations   Tub/shower bench     Recommendations for Other Services         Precautions/Restrictions   Restrictions Weight Bearing Restrictions Per Provider Order: No Other Position/Activity Restrictions: uses 3L O2 at her baseline     Mobility Bed Mobility        General bed mobility comments: up in recliner    Transfers Overall transfer level: Independent Equipment used: None Transfers: Sit to/from Stand           Balance Overall balance assessment: No apparent balance deficits (not formally assessed)              ADL either performed or assessed with clinical judgement   ADL Overall ADL's : Independent;Modified independent;At baseline      General ADL Comments: The pt  reported having difficulty with managing household chores over the past few months, give shortness of breath and compromised endurance. As such, OT provided introductory education on implementing energy conservation strategies as needed in the home while performing household chores. A relevant educational handout was reviewed and issued to the patient. Her questions were addressed accordingly.      Pertinent Vitals/Pain Pain Assessment Pain Assessment: 0-10 Pain Score: 3  Pain Location: abdomen Pain Intervention(s): Monitored during session, Limited activity within patient's tolerance     Extremity/Trunk Assessment Upper Extremity Assessment Upper Extremity Assessment: Overall WFL for tasks assessed;Right hand dominant   Lower Extremity Assessment Lower Extremity Assessment: Overall WFL for tasks assessed RLE Deficits / Details: distal LE edema noted LLE Deficits / Details: distal LE edema noted       Communication Communication Communication: No apparent difficulties   Cognition Arousal: Alert Behavior During Therapy: WFL for tasks assessed/performed               OT - Cognition Comments: Oriented x4                 Following commands: Intact       Cueing  General Comments   Cueing Techniques: Verbal cues              Home Living Family/patient expects to be discharged to:: Private residence Living Arrangements: Children (Son who works) Available Help at Discharge: Family Type of Home: House Home Access: Stairs to enter Secretary/administrator of Steps: 2 Entrance Stairs-Rails:  Right Home Layout: One level     Bathroom Shower/Tub: Tub/shower unit         Home Equipment: Cane - single point;Other (comment);Shower seat (Oxygen )   Additional Comments: She uses 3L O2.      Prior Functioning/Environment Prior Level of Function : Independent/Modified Independent;Driving             Mobility Comments:  (Independent with  ambulation.) ADLs Comments:  (She was modified independent to independent with ADLs. She typically takes spongebaths. She prepares simple meals, and cleaning has gotten difficult given her compromised endurance.)    OT Problem List: Decreased activity tolerance (N/A)   OT Treatment/Interventions:   N/A     OT Goals(Current goals can be found in the care plan section)   Acute Rehab OT Goals OT Goal Formulation: All assessment and education complete, DC therapy   OT Frequency:   N/A       AM-PAC OT 6 Clicks Daily Activity     Outcome Measure Help from another person eating meals?: None Help from another person taking care of personal grooming?: None Help from another person toileting, which includes using toliet, bedpan, or urinal?: None Help from another person bathing (including washing, rinsing, drying)?: None Help from another person to put on and taking off regular upper body clothing?: None Help from another person to put on and taking off regular lower body clothing?: None 6 Click Score: 24   End of Session Equipment Utilized During Treatment: Oxygen  Nurse Communication: Mobility status  Activity Tolerance: Patient tolerated treatment well Patient left: in chair;with call bell/phone within reach  OT Visit Diagnosis: Muscle weakness (generalized) (M62.81)                Time: 8966-8891 OT Time Calculation (min): 35 min Charges:  OT General Charges $OT Visit: 1 Visit OT Evaluation $OT Eval Low Complexity: 1 Low OT Treatments $Self Care/Home Management : 8-22 mins    Delanna JINNY Lesches, OTR/L 09/14/2024, 12:37 PM

## 2024-09-14 NOTE — Progress Notes (Signed)
 PROGRESS NOTE  Lynn Kline  FMW:[id number] DOB: July 10, 1956 DOA: 09/02/2024 PCP: [name], MD   Brief Narrative: Patient is a 68 year old female with history of anxiety, COPD, chronic hypoxic respiratory failure requiring 3 of oxygen  at baseline, diverticulosis, colon polyps who presented with urinary incontinence, left-sided abdominal pain.  UA was suspicious for UTI.  Urine culture grew multiple species.  Abdomen/pelvis CT showed sigmoid diverticulitis complicated by a fistulous communication to the bladder with secondary cystitis.  Consulted GI, plan for colonoscopy as an outpatient.  General surgery following. Consulted  urology as well .currently on ceftriaxone , Flagyl , plan for 2 weeks of antibiotics course during this hospitalization, day 13/14.  Plan for sigmoid colectomy and takedown of the colovesical fistula as an outpatient.  Assessment & Plan:  Principal Problem:   Colovesical fistula Active Problems:   COPD with asthma (HCC)   Tobacco abuse   IBS (irritable bowel syndrome)   Coronary atherosclerosis due to calcified coronary lesion   Prediabetes   GAD (generalized anxiety disorder)   Chronic respiratory failure with hypoxia (HCC)   Diverticulitis   Malnutrition of moderate degree   COPD, severe (HCC)   Pre-op evaluation  Sigmoid diverticulitis/colovesical fistula: Presented with abdominal pain, urinary in continence. UA was suspicious for UTI.  Urine culture grew multiple species.  Abdomen/pelvis CT on 11/23 showed sigmoid diverticulitis complicated by a fistulous communication to the bladder with secondary cystitis. CT pelvis without contrast on 11/24 showed active sigmoid diverticulitis, minimally improved, with associated soft tissue thickening extending to the left bladder dome consistent with a fistula,Cystitis with trace intravesical air,mild left ureteral distention extending down to the  inflammatory changes,Inflammatory changes surrounding the left ovary  without evidence of tubo-ovarian abscess. General surgery recommended to obtain urology consultation.  Dr. Carolee saw her on 11/25.  Recommended no intervention for now.  Recommended renal ultrasound in couple of weeks and cystoscopy as an outpatient Plan to continue 2 weeks of IV antibiotic therapy during this hospitalization.  Currently on Rocephin , Flagyl .  Day 13 /14. General surgery plan to follow-up as an outpatient with colorectal specialist after colonoscopy.Plan for sigmoid colectomy and takedown of the colovesical fistula as an outpatient, likely in 6 weeks Currently on soft diet and she is tolerating.  Foley has been removed  Colovesical fistula: GI planning to perform colonoscopy in January to reevaluate prior to consideration of surgery. Plan for sigmoid colectomy and takedown of the colovesical fistula as an outpatient, likely in 6 weeks  Chronic hypoxic respiratory failure/COPD: Continue bronchodilators as needed.  On 3 L of oxygen  at home at baseline.  Pulmonary saw her here  Hyperlipidemia: Takes Crestor  at home  Iron deficiency anemia: Current hemoglobin in the range of 9.  Iron studies showed low iron.  Continue iron supplementation  Left wrist pain: Left wrist slightly tender.  Ultrasound of the left wrist did not show any abscess or fluid collection.  Checking x-ray today.  Not much edematous today.  Apply cool compress, NSAIDs/Tylenol   Deconditioning: Patient lives with her dog.  Ambulatory at baseline.  Will consult PT  Nutrition Problem: Moderate Malnutrition Etiology: acute illness    DVT prophylaxis:enoxaparin  (LOVENOX ) injection 40 mg Start: 09/08/24 1000 SCDs Start: 09/02/24 1946     Code Status: Full Code  Family Communication: None at bedside  Patient status:Inpatient  Patient is from :home  Anticipated discharge un:ynfz  Estimated DC date:after finishing IV antibiotics.  Likely on Sunday/Monday   Consultants: General surgery, GI  Procedures:  None yet  Antimicrobials:  Anti-infectives (From admission, onward)    Start     Dose/Rate Route Frequency Ordered Stop   09/03/24 1600  cefTRIAXone  (ROCEPHIN ) 2 g in sodium chloride  0.9 % 100 mL IVPB        2 g 200 mL/hr over 30 Minutes Intravenous Every 24 hours 09/02/24 1944     09/03/24 0800  metroNIDAZOLE  (FLAGYL ) IVPB 500 mg        500 mg 100 mL/hr over 60 Minutes Intravenous Every 12 hours 09/02/24 1944     09/02/24 1715  metroNIDAZOLE  (FLAGYL ) IVPB 500 mg        500 mg 100 mL/hr over 60 Minutes Intravenous  Once 09/02/24 1710 09/02/24 1950   09/02/24 1530  cefTRIAXone  (ROCEPHIN ) 1 g in sodium chloride  0.9 % 100 mL IVPB        1 g 200 mL/hr over 30 Minutes Intravenous  Once 09/02/24 1529 09/02/24 1659       Subjective: Patient seen and examined at bedside today.  Overall comfortable, lying on bed.  Had a bowel movement today.  Tolerating soft diet.  Still have left lower quadrant, lower abdominal discomfort but pain is tolerable.  Still complaining of left wrist pain today.  Left wrist not edematous.  We discussed about doing an x-ray.  Encouraged for cool compress  Objective: Vitals:   09/13/24 1544 09/13/24 1920 09/13/24 1941 09/14/24 0440  BP: 113/64 129/82  107/72  Pulse: 93 99  (!) 110  Resp: 18 18  20   Temp: 98.3 F (36.8 C) 98.3 F (36.8 C)  98.5 F (36.9 C)  TempSrc:  Oral    SpO2: 100% 100% 100% 96%  Weight:      Height:       No intake or output data in the 24 hours ending 09/14/24 1032  Filed Weights   09/11/24 2058  Weight: 57.6 kg    Examination:  General exam: Overall comfortable, not in distress HEENT: PERRL Respiratory system:  no wheezes or crackles  Cardiovascular system: S1 & S2 heard, RRR.  Gastrointestinal system: Abdomen is nondistended, soft and nontender.  Mild tenderness in the suprapubic region/left lower quadrant Central nervous system: Alert and oriented Extremities: No edema, no clubbing ,no cyanosis, tenderness on the left  wrist Skin: No rashes, no ulcers,no icterus          Data Reviewed: I have personally reviewed following labs and imaging studies  CBC: Recent Labs  Lab 09/08/24 1130 09/09/24 0413 09/10/24 0423 09/11/24 0451 09/12/24 0433  WBC 12.9* 9.7 8.6 8.5 9.0  NEUTROABS 9.9* 6.6 5.5 5.4 5.3  HGB 10.3* 9.5* 9.3* 9.4* 9.9*  HCT 33.1* 30.4* 29.9* 30.1* 31.3*  MCV 93.8 93.0 92.0 91.8 91.8  PLT 464* 405* 444* 447* 468*   Basic Metabolic Panel: Recent Labs  Lab 09/11/24 0451  NA 137  K 4.3  CL 99  CO2 33*  GLUCOSE 92  BUN <5*  CREATININE 0.46  CALCIUM  8.4*     No results found for this or any previous visit (from the past 240 hours).    Radiology Studies: US  LT UPPER EXTREM LTD SOFT TISSUE NON VASCULAR Result Date: 09/13/2024 EXAM: US  LEFT UPPER EXTREMITY NONVASCULAR SOFT TISSUE 09/13/2024 05:56:11 PM TECHNIQUE: Real-time ultrasound scan of the left upper extremity with image documentation. COMPARISON: None available. CLINICAL HISTORY: [id number] Wrist swelling, left [id number] FINDINGS: SOFT TISSUES: No fluid collection to indicate a drainable abscess. No discrete mass. Vessels appear patent. IMPRESSION: 1. No fluid collection or mass.  Electronically signed by: Norman Gatlin MD 09/13/2024 08:52 PM EST RP Workstation: [id number]     Scheduled Meds:  albuterol   2.5 mg Nebulization BID   budesonide -glycopyrrolate -formoterol   2 puff Inhalation BID AC & HS   enoxaparin  (LOVENOX ) injection  40 mg Subcutaneous Q24H   feeding supplement  237 mL Oral BID BM   furosemide  20 mg Oral Daily   gabapentin   200 mg Oral QHS   multivitamins with iron  1 tablet Oral Daily   polycarbophil  625 mg Oral BID   sodium chloride  flush  3 mL Intravenous Q12H   Continuous Infusions:  cefTRIAXone  (ROCEPHIN )  IV 2 g (09/13/24 1543)   metronidazole  500 mg (09/14/24 0926)     LOS: 12 days   Ivonne Mustache, MD [facility] P11/29/2025, 10:32 AM

## 2024-09-14 NOTE — Plan of Care (Signed)
   Problem: Education: Goal: Knowledge of General Education information will improve Description: Including pain rating scale, medication(s)/side effects and non-pharmacologic comfort measures Outcome: Progressing   Problem: Clinical Measurements: Goal: Ability to maintain clinical measurements within normal limits will improve Outcome: Progressing

## 2024-09-14 NOTE — Plan of Care (Signed)
 Patient progressing well.  Ambulating in room frequently.

## 2024-09-15 DIAGNOSIS — N321 Vesicointestinal fistula: Secondary | ICD-10-CM | POA: Diagnosis not present

## 2024-09-15 LAB — CBC
HCT: 30.1 % — ABNORMAL LOW (ref 36.0–46.0)
Hemoglobin: 9.5 g/dL — ABNORMAL LOW (ref 12.0–15.0)
MCH: 28.8 pg (ref 26.0–34.0)
MCHC: 31.6 g/dL (ref 30.0–36.0)
MCV: 91.2 fL (ref 80.0–100.0)
Platelets: 530 K/uL — ABNORMAL HIGH (ref 150–400)
RBC: 3.3 MIL/uL — ABNORMAL LOW (ref 3.87–5.11)
RDW: 13.9 % (ref 11.5–15.5)
WBC: 10.9 K/uL — ABNORMAL HIGH (ref 4.0–10.5)
nRBC: 0 % (ref 0.0–0.2)

## 2024-09-15 LAB — BASIC METABOLIC PANEL WITH GFR
Anion gap: 7 (ref 5–15)
BUN: 7 mg/dL — ABNORMAL LOW (ref 8–23)
CO2: 33 mmol/L — ABNORMAL HIGH (ref 22–32)
Calcium: 8.7 mg/dL — ABNORMAL LOW (ref 8.9–10.3)
Chloride: 98 mmol/L (ref 98–111)
Creatinine, Ser: 0.49 mg/dL (ref 0.44–1.00)
GFR, Estimated: >60 mL/min (ref >60)
Glucose, Bld: 91 mg/dL (ref 70–99)
Potassium: 4.3 mmol/L (ref 3.5–5.1)
Sodium: 137 mmol/L (ref 135–145)

## 2024-09-15 MED ORDER — FUROSEMIDE 10 MG/ML IJ SOLN
40.0000 mg | Freq: Once | INTRAMUSCULAR | Status: AC
  Administered 2024-09-15: 40 mg via INTRAVENOUS
  Filled 2024-09-15: qty 4

## 2024-09-15 MED ORDER — FUROSEMIDE 20 MG PO TABS
20.0000 mg | ORAL_TABLET | Freq: Every day | ORAL | Status: DC
  Administered 2024-09-16: 20 mg via ORAL
  Filled 2024-09-15: qty 1

## 2024-09-15 NOTE — Plan of Care (Signed)

## 2024-09-15 NOTE — Progress Notes (Signed)
 Mobility Specialist - Progress Note   09/15/24 1039  Mobility  Activity Ambulated independently  Level of Assistance Modified independent, requires aide device or extra time  Assistive Device None  Distance Ambulated (ft) 500 ft  Range of Motion/Exercises Active  Activity Response Tolerated well  Mobility visit 1 Mobility  Mobility Specialist Start Time (ACUTE ONLY) 1020  Mobility Specialist Stop Time (ACUTE ONLY) 1033  Mobility Specialist Time Calculation (min) (ACUTE ONLY) 13 min   Pt was found in bed and agreeable to mobilize. No complaints. At EOS returned to room with all needs met. Call bell in reach.   Erminio Leos,  Mobility Specialist Can be reached via Secure Chat

## 2024-09-15 NOTE — Plan of Care (Signed)

## 2024-09-15 NOTE — Progress Notes (Signed)
 PROGRESS NOTE  Lynn Kline  FMW:[id number] DOB: 07/10/1956 DOA: 09/02/2024 PCP: [name], MD   Brief Narrative: Patient is a 68 year old female with history of anxiety, COPD, chronic hypoxic respiratory failure requiring 3 of oxygen  at baseline, diverticulosis, colon polyps who presented with urinary incontinence, left-sided abdominal pain.  UA was suspicious for UTI.  Urine culture grew multiple species.  Abdomen/pelvis CT showed sigmoid diverticulitis complicated by a fistulous communication to the bladder with secondary cystitis.  Consulted GI, plan for colonoscopy as an outpatient.  General surgery following. Consulted  urology as well .currently on ceftriaxone , Flagyl , plan for 2 weeks of antibiotics course during this hospitalization, day 14/14.  Plan for sigmoid colectomy and takedown of the colovesical fistula as an outpatient.  Plan for discharge tomorrow.  Assessment & Plan:  Principal Problem:   Colovesical fistula Active Problems:   COPD with asthma (HCC)   Tobacco abuse   IBS (irritable bowel syndrome)   Coronary atherosclerosis due to calcified coronary lesion   Prediabetes   GAD (generalized anxiety disorder)   Chronic respiratory failure with hypoxia (HCC)   Diverticulitis   Malnutrition of moderate degree   COPD, severe (HCC)   Pre-op evaluation  Sigmoid diverticulitis/colovesical fistula: Presented with abdominal pain, urinary in continence. UA was suspicious for UTI.  Urine culture grew multiple species.  Abdomen/pelvis CT on 11/23 showed sigmoid diverticulitis complicated by a fistulous communication to the bladder with secondary cystitis. CT pelvis without contrast on 11/24 showed active sigmoid diverticulitis, minimally improved, with associated soft tissue thickening extending to the left bladder dome consistent with a fistula,Cystitis with trace intravesical air,mild left ureteral distention extending down to the  inflammatory changes,Inflammatory changes  surrounding the left ovary without evidence of tubo-ovarian abscess. General surgery recommended to obtain urology consultation.  Dr. Carolee saw her on 11/25.  Recommended no intervention for now.  Recommended renal ultrasound in couple of weeks and cystoscopy as an outpatient Plan to continue 2 weeks of IV antibiotic therapy during this hospitalization.  Currently on Rocephin , Flagyl .  Day 14 /14. General surgery plan to follow-up as an outpatient with colorectal specialist after colonoscopy.Plan for sigmoid colectomy and takedown of the colovesical fistula as an outpatient, likely in 6 weeks Currently on soft diet and she is tolerating.  Foley has been removed  Colovesical fistula: GI planning to perform colonoscopy in January to reevaluate prior to consideration of surgery. Plan for sigmoid colectomy and takedown of the colovesical fistula as an outpatient, likely in 6 weeks  Chronic hypoxic respiratory failure/COPD: Continue bronchodilators as needed.  On 3 L of oxygen  at home at baseline.  Pulmonary saw her here  Hyperlipidemia: Takes Crestor  at home  Iron deficiency anemia: Current hemoglobin in the range of 9.  Iron studies showed low iron.  Continue iron supplementation  Left wrist pain: Much better now.  Ultrasound of the left wrist did not show any abscess or fluid collection.  X-ray showed degenerative changes.  Not much edematous today.  Continue cool compress, NSAIDs/Tylenol   Lower extremity edema: Takes Lasix 20 p.o. daily at home.  Will give her a dose of IV Lasix 40 mg once.  Ordered compression stockings  Deconditioning: Patient lives with her dog.  Ambulatory at baseline.  PT did not recommend any follow-up  Nutrition Problem: Moderate Malnutrition Etiology: acute illness    DVT prophylaxis:enoxaparin  (LOVENOX ) injection 40 mg Start: 09/08/24 1000 SCDs Start: 09/02/24 1946     Code Status: Full Code  Family Communication: None at bedside  Patient  status:Inpatient  Patient is from :home  Anticipated discharge un:ynfz  Estimated DC date: Tomorrow   Consultants: General surgery, GI  Procedures: None yet  Antimicrobials:  Anti-infectives (From admission, onward)    Start     Dose/Rate Route Frequency Ordered Stop   09/03/24 1600  cefTRIAXone  (ROCEPHIN ) 2 g in sodium chloride  0.9 % 100 mL IVPB        2 g 200 mL/hr over 30 Minutes Intravenous Every 24 hours 09/02/24 1944     09/03/24 0800  metroNIDAZOLE  (FLAGYL ) IVPB 500 mg        500 mg 100 mL/hr over 60 Minutes Intravenous Every 12 hours 09/02/24 1944     09/02/24 1715  metroNIDAZOLE  (FLAGYL ) IVPB 500 mg        500 mg 100 mL/hr over 60 Minutes Intravenous  Once 09/02/24 1710 09/02/24 1950   09/02/24 1530  cefTRIAXone  (ROCEPHIN ) 1 g in sodium chloride  0.9 % 100 mL IVPB        1 g 200 mL/hr over 30 Minutes Intravenous  Once 09/02/24 1529 09/02/24 1659       Subjective: Patient seen and examined at bedside today.  She feels better today.  Sitting in chair.  Had a bowel movement.  Abdomen pain tolerable.  No nausea or vomiting.  Left hand pain better.  She feels ready to go home tomorrow.  We discussed about giving a dose of lasix  for her lower extremity edema.  Objective: Vitals:   09/14/24 1919 09/14/24 2054 09/15/24 0545 09/15/24 0750  BP: 116/66  109/71   Pulse: (!) 101  (!) 102   Resp: 16  18   Temp: 98.2 F (36.8 C)  98.4 F (36.9 C)   TempSrc: Oral  Oral   SpO2: 98% 97% 96% 98%  Weight:      Height:        Intake/Output Summary (Last 24 hours) at 09/15/2024 1000 Last data filed at 09/15/2024 0030 Gross per 24 hour  Intake 570 ml  Output --  Net 570 ml    Filed Weights   09/11/24 2058  Weight: 57.6 kg    Examination:   General exam: Overall comfortable, not in distress HEENT: PERRL Respiratory system:  no wheezes or crackles  Cardiovascular system: S1 & S2 heard, RRR.  Gastrointestinal system: Abdomen is nondistended, soft and nontender.   Mild tenderness in the suprapubic region/left lower quadrant Central nervous system: Alert and oriented Extremities: No edema, no clubbing ,no cyanosis Skin: No rashes, no ulcers,no icterus           Data Reviewed: I have personally reviewed following labs and imaging studies  CBC: Recent Labs  Lab 09/08/24 1130 09/09/24 0413 09/10/24 0423 09/11/24 0451 09/12/24 0433 09/15/24 0411  WBC 12.9* 9.7 8.6 8.5 9.0 10.9*  NEUTROABS 9.9* 6.6 5.5 5.4 5.3  --   HGB 10.3* 9.5* 9.3* 9.4* 9.9* 9.5*  HCT 33.1* 30.4* 29.9* 30.1* 31.3* 30.1*  MCV 93.8 93.0 92.0 91.8 91.8 91.2  PLT 464* 405* 444* 447* 468* 530*   Basic Metabolic Panel: Recent Labs  Lab 09/11/24 0451 09/15/24 0411  NA 137 137  K 4.3 4.3  CL 99 98  CO2 33* 33*  GLUCOSE 92 91  BUN <5* 7*  CREATININE 0.46 0.49  CALCIUM  8.4* 8.7*     No results found for this or any previous visit (from the past 240 hours).    Radiology Studies: DG Wrist 2 Views Left Result Date: 09/14/2024 CLINICAL  DATA:  Left wrist swelling EXAM: LEFT WRIST - 2 VIEW COMPARISON:  None Available. FINDINGS: There is no evidence of fracture or dislocation. Degenerative changes at the STT joint and thumb carpometacarpal joint. Amorphous calcification along the radiocarpal joint. Soft tissues are unremarkable. IMPRESSION: 1. No acute fracture or dislocation. 2. Degenerative changes of the wrist and amorphous calcification along the radiocarpal joint, which may represent chondrocalcinosis. Electronically Signed   By: Limin  Xu M.D.   On: 09/14/2024 13:52   US  LT UPPER EXTREM LTD SOFT TISSUE NON VASCULAR Result Date: 09/13/2024 EXAM: US  LEFT UPPER EXTREMITY NONVASCULAR SOFT TISSUE 09/13/2024 05:56:11 PM TECHNIQUE: Real-time ultrasound scan of the left upper extremity with image documentation. COMPARISON: None available. CLINICAL HISTORY: [id number] Wrist swelling, left [id number] FINDINGS: SOFT TISSUES: No fluid collection to indicate a drainable abscess. No discrete  mass. Vessels appear patent. IMPRESSION: 1. No fluid collection or mass. Electronically signed by: Norman Gatlin MD 09/13/2024 08:52 PM EST RP Workstation: [id number]     Scheduled Meds:  albuterol   2.5 mg Nebulization BID   budesonide -glycopyrrolate -formoterol   2 puff Inhalation BID AC & HS   enoxaparin  (LOVENOX ) injection  40 mg Subcutaneous Q24H   feeding supplement  237 mL Oral BID BM   [START ON 09/16/2024] furosemide   20 mg Oral Daily   gabapentin   200 mg Oral QHS   multivitamins with iron   1 tablet Oral Daily   polycarbophil  625 mg Oral BID   sodium chloride  flush  3 mL Intravenous Q12H   Continuous Infusions:  cefTRIAXone  (ROCEPHIN )  IV 2 g (09/14/24 1713)   metronidazole  500 mg (09/15/24 0944)     LOS: 13 days   Ivonne Mustache, MD [facility] P11/30/2025, 10:00 AM

## 2024-09-16 ENCOUNTER — Other Ambulatory Visit (HOSPITAL_COMMUNITY): Payer: Self-pay

## 2024-09-16 DIAGNOSIS — N321 Vesicointestinal fistula: Secondary | ICD-10-CM | POA: Diagnosis not present

## 2024-09-16 MED ORDER — CALCIUM POLYCARBOPHIL 625 MG PO TABS
625.0000 mg | ORAL_TABLET | Freq: Two times a day (BID) | ORAL | 0 refills | Status: AC
  Filled 2024-09-16: qty 60, 30d supply, fill #0

## 2024-09-16 MED ORDER — IBUPROFEN 400 MG PO TABS
400.0000 mg | ORAL_TABLET | Freq: Four times a day (QID) | ORAL | 0 refills | Status: DC | PRN
  Filled 2024-09-16: qty 20, 5d supply, fill #0

## 2024-09-16 MED ORDER — FUROSEMIDE 20 MG PO TABS
20.0000 mg | ORAL_TABLET | Freq: Every day | ORAL | 0 refills | Status: DC
  Filled 2024-09-16: qty 30, 30d supply, fill #0

## 2024-09-16 MED ORDER — GABAPENTIN 100 MG PO CAPS
200.0000 mg | ORAL_CAPSULE | Freq: Every day | ORAL | 0 refills | Status: DC
  Filled 2024-09-16: qty 30, 15d supply, fill #0

## 2024-09-16 MED ORDER — FERROUS SULFATE 325 (65 FE) MG PO TBEC
325.0000 mg | DELAYED_RELEASE_TABLET | Freq: Every day | ORAL | 0 refills | Status: DC
  Filled 2024-09-16: qty 60, 60d supply, fill #0

## 2024-09-16 MED ORDER — PANTOPRAZOLE SODIUM 40 MG PO TBEC
40.0000 mg | DELAYED_RELEASE_TABLET | Freq: Every day | ORAL | 0 refills | Status: DC
  Filled 2024-09-16: qty 30, 30d supply, fill #0

## 2024-09-16 NOTE — Plan of Care (Signed)

## 2024-09-16 NOTE — Telephone Encounter (Signed)
 Scheduled for 12/29 with Dr Zaida. Letter mailed.

## 2024-09-16 NOTE — Progress Notes (Signed)
 Discharge meds in a secure bag delivered to patient in room by this RN

## 2024-09-16 NOTE — Care Management Important Message (Signed)
 Important Message  Patient Details IM Letter given. Name: Lynn Kline MRN: [id number] Date of Birth: 01-05-1956   Important Message Given:  Yes - Medicare IM     [name] 09/16/2024, 11:59 AM

## 2024-09-16 NOTE — Discharge Summary (Signed)
 Physician Discharge Summary  Lynn Kline FMW:[id number] DOB: 18-Jul-1956 DOA: 09/02/2024  PCP: [name], MD  Admit date: 09/02/2024 Discharge date: 09/16/2024  Admitted From: Home Disposition:  Home  Discharge Condition:Stable CODE STATUS:FULL Diet recommendation: soft diet  Brief/Interim Summary: Patient is a 68 year old female with history of anxiety, COPD, chronic hypoxic respiratory failure requiring 3 of oxygen  at baseline, diverticulosis, colon polyps who presented with urinary incontinence, left-sided abdominal pain.  UA was suspicious for UTI.  Urine culture grew multiple species.  Abdomen/pelvis CT showed sigmoid diverticulitis complicated by a fistulous communication to the bladder with secondary cystitis.  Consulted GI, plan for colonoscopy as an outpatient.  General surgery was following. Consulted  urology as well .She was  on ceftriaxone , Flagyl ,completed 2 weeks of antibiotics course .  Plan for sigmoid colectomy and takedown of the colovesical fistula as an outpatient.  General surgery cleared for discharge.  Tolerating soft diet.  Medically stable for discharge today  Following problems were addressed during the hospitalization:  Sigmoid diverticulitis/colovesical fistula: Presented with abdominal pain, urinary in continence. UA was suspicious for UTI.  Urine culture grew multiple species.  Abdomen/pelvis CT on 11/23 showed sigmoid diverticulitis complicated by a fistulous communication to the bladder with secondary cystitis. CT pelvis without contrast on 11/24 showed active sigmoid diverticulitis, minimally improved, with associated soft tissue thickening extending to the left bladder dome consistent with a fistula,Cystitis with trace intravesical air,mild left ureteral distention extending down to the  inflammatory changes,Inflammatory changes surrounding the left ovary without evidence of tubo-ovarian abscess. General surgery recommended to obtain urology  consultation.  Dr. Carolee saw her on 11/25.  Recommended no intervention for now.  Recommended renal ultrasound in couple of weeks and cystoscopy as an outpatient Competed 2 weeks of IV antibiotic therapy during this hospitalization.  General surgery plan to follow-up as an outpatient with colorectal specialist after colonoscopy.Plan for sigmoid colectomy and takedown of the colovesical fistula as an outpatient, likely in 6 weeks Currently on soft diet and she is tolerating.  Foley has been removed   Colovesical fistula: GI planning to perform colonoscopy in January to reevaluate prior to consideration of surgery. Plan for sigmoid colectomy and takedown of the colovesical fistula as an outpatient, likely in 6 weeks   Chronic hypoxic respiratory failure/COPD: Continue bronchodilators as needed.  On 3 L of oxygen  at home at baseline.  Pulmonary saw her here.  She has appointment with pulmonology on 12/21   Hyperlipidemia: Takes Crestor  at home   Iron  deficiency anemia: Current hemoglobin in the range of 9.  Iron  studies showed low iron .  Continue iron  supplementation   Left wrist pain: Much better now.  Ultrasound of the left wrist did not show any abscess or fluid collection.  X-ray showed degenerative changes.  Not much edematous today.  Continue cool compress, NSAIDs/Tylenol  at homee   Lower extremity edema:Given a dose of IV Lasix  40 mg once.  Ordered compression stockings.  Continue Lasix  20 p.o. daily at home   Deconditioning: Patient lives with her dog.  Ambulatory at baseline.  PT did not recommend any follow-up     Discharge Diagnoses:  Principal Problem:   Colovesical fistula Active Problems:   COPD with asthma (HCC)   Tobacco abuse   IBS (irritable bowel syndrome)   Coronary atherosclerosis due to calcified coronary lesion   Prediabetes   GAD (generalized anxiety disorder)   Chronic respiratory failure with hypoxia (HCC)   Diverticulitis   Malnutrition of moderate degree  COPD, severe [facility])   Pre-op evaluation    Discharge Instructions  Discharge Instructions     Diet general   Complete by: As directed    Soft diet   Discharge instructions   Complete by: As directed    1)Please take your medications as instructed 2)Follow up with your PCP in a week 3)You have an appointment with colorectal surgery on 11/18/2024 and pulmonology on 10/14/24 4)Follow up with urology as an outpatient.  Name and number of  the provider has been attached.   Increase activity slowly   Complete by: As directed       Allergies as of 09/16/2024       Reactions   Codeine Nausea And Vomiting   Erythromycin Nausea And Vomiting   Ampicillin Nausea And Vomiting, Rash, Other (See Comments)   Pt states that rash was on her tongue.  Has patient had a PCN reaction causing immediate rash, facial/tongue/throat swelling, SOB or lightheadedness with hypotension yes Has patient had a PCN reaction causing severe rash involving mucus membranes or skin necrosis: no Has patient had a PCN reaction that required hospitalization yes Has patient had a PCN reaction occurring within the last 10 years: no If all of the above answers are NO, then may proceed with Cephalosporin use.        Medication List     STOP taking these medications    naproxen sodium 220 MG tablet Commonly known as: ALEVE       TAKE these medications    albuterol  (2.5 MG/3ML) 0.083% nebulizer solution Commonly known as: PROVENTIL  USE 3 ML VIA NEBULIZER EVERY 4 HOURS AS NEEDED FOR WHEEZING OR SHORTNESS OF BREATH What changed: See the new instructions.   Ventolin  HFA 108 (90 Base) MCG/ACT inhaler Generic drug: albuterol  INHALE 1 TO 2 PUFFS INTO THE LUNGS EVERY 6 HOURS AS NEEDED FOR WHEEZING OR SHORTNESS OF BREATH What changed: Another medication with the same name was changed. Make sure you understand how and when to take each.   ALPRAZolam  0.25 MG tablet Commonly known as: XANAX  TAKE 1 TABLET(0.25 MG)  BY MOUTH TWICE DAILY AS NEEDED What changed: See the new instructions.   Breztri  Aerosphere 160-9-4.8 MCG/ACT Aero inhaler Generic drug: budesonide -glycopyrrolate -formoterol  Inhale 2 puffs into the lungs in the morning and at bedtime.   cholecalciferol 25 MCG (1000 UNIT) tablet Commonly known as: VITAMIN D3 Take 1,000 Units by mouth daily.   cyanocobalamin  2000 MCG tablet Take 1 tablet (2,000 mcg total) by mouth daily.   ferrous sulfate  325 (65 FE) MG EC tablet Take 1 tablet (325 mg total) by mouth daily with breakfast.   furosemide 20 MG tablet Commonly known as: LASIX Take 1 tablet (20 mg total) by mouth daily. Start taking on: September 17, 2024   gabapentin  100 MG capsule Commonly known as: NEURONTIN  Take 2 capsules (200 mg total) by mouth at bedtime.   ibuprofen 400 MG tablet Commonly known as: ADVIL Take 1 tablet (400 mg total) by mouth every 6 (six) hours as needed for mild pain (pain score 1-3) or moderate pain (pain score 4-6).   OXYGEN  Inhale 3 L/min into the lungs continuous.   pantoprazole  40 MG tablet Commonly known as: Protonix  Take 1 tablet (40 mg total) by mouth daily.   polycarbophil 625 MG tablet Commonly known as: FIBERCON Take 1 tablet (625 mg total) by mouth 2 (two) times daily.   rosuvastatin  10 MG tablet Commonly known as: Crestor  Take 1 tablet (10 mg total) by mouth  daily. What changed: when to take this        Follow-up Information     Debby Hila, MD Follow up on 11/18/2024.   Specialties: General Surgery, Colon and Rectal Surgery Why: 9:10am, Arrive 30 minutes prior to your appointment time, Please bring your insurance card and photo ID.   This appiontment will need to be after colonoscopy completed. Contact information: [address] [place] [address] [phone]         Joshua Debby CROME, MD. Schedule an appointment as soon as possible for a visit in 1 week(s).   Specialty: Internal Medicine Contact  information: [address] [place] [address] [phone]         Carolee Sherwood BIRCH III, MD. Schedule an appointment as soon as possible for a visit in 2 week(s).   Specialty: Urology Contact information: [address] [place] [address] [phone]                Allergies  Allergen Reactions   Codeine Nausea And Vomiting   Erythromycin Nausea And Vomiting   Ampicillin Nausea And Vomiting, Rash and Other (See Comments)    Pt states that rash was on her tongue.  Has patient had a PCN reaction causing immediate rash, facial/tongue/throat swelling, SOB or lightheadedness with hypotension yes Has patient had a PCN reaction causing severe rash involving mucus membranes or skin necrosis: no Has patient had a PCN reaction that required hospitalization yes Has patient had a PCN reaction occurring within the last 10 years: no If all of the above answers are NO, then may proceed with Cephalosporin use.     Consultations: General surgery, GI, urology   Procedures/Studies: DG Wrist 2 Views Left Result Date: 09/14/2024 CLINICAL DATA:  Left wrist swelling EXAM: LEFT WRIST - 2 VIEW COMPARISON:  None Available. FINDINGS: There is no evidence of fracture or dislocation. Degenerative changes at the STT joint and thumb carpometacarpal joint. Amorphous calcification along the radiocarpal joint. Soft tissues are unremarkable. IMPRESSION: 1. No acute fracture or dislocation. 2. Degenerative changes of the wrist and amorphous calcification along the radiocarpal joint, which may represent chondrocalcinosis. Electronically Signed   By: Limin  Xu M.D.   On: 09/14/2024 13:52   US  LT UPPER EXTREM LTD SOFT TISSUE NON VASCULAR Result Date: 09/13/2024 EXAM: US  LEFT UPPER EXTREMITY NONVASCULAR SOFT TISSUE 09/13/2024 05:56:11 PM TECHNIQUE: Real-time ultrasound scan of the left upper extremity with image documentation. COMPARISON: None available. CLINICAL HISTORY: [id number] Wrist  swelling, left [id number] FINDINGS: SOFT TISSUES: No fluid collection to indicate a drainable abscess. No discrete mass. Vessels appear patent. IMPRESSION: 1. No fluid collection or mass. Electronically signed by: Norman Gatlin MD 09/13/2024 08:52 PM EST RP Workstation: [id number]   CT PELVIS WO CONTRAST Result Date: 09/09/2024 EXAM: CT PELVIS, WITHOUT IV CONTRAST 09/09/2024 10:49:00 PM TECHNIQUE: Axial images were acquired through the pelvis without IV contrast. Reformatted images were reviewed. Automated exposure control, iterative reconstruction, and/or weight based adjustment of the mA/kV was utilized to reduce the radiation dose to as low as reasonably achievable. COMPARISON: CT abdomen and pelvis with IV and oral contrast yesterday, and CT abdomen and pelvis without contrast on 09/02/2024. CLINICAL HISTORY: Abdominal pain, acute (Ped 0-17y). FINDINGS: BONES: No acute fracture or focal osseous lesion. JOINTS: No dislocation. The joint spaces are normal. SOFT TISSUES: Mild body wall edema in the upper thighs and hips. INTRAPELVIC CONTENTS: There is contrast throughout the visualized colon, which may have been instilled retrograde per  rectum. The visualized small bowel is normal in caliber. The appendix is normal. The visualized colon wall is unremarkable until the distal descending segment and sigmoid, which show advanced diverticulosis. Changes of active sigmoid diverticulitis involving the proximal to mid sigmoid segment are again noted with wall thickening and stranding, minimally improved. The underlying bladder is contracted around the foley catheter with wall thickening and stranding consistent with cystitis. There is trace air in the bladder. Soft tissue thickening extending from the dorsal surface of the diseased sigmoid colon again extends to the left side of the bladder dome; on yesterday's study, this contained air locules within the soft tissue thickening consistent with a fistula. There is no  contrast in the fistula at this time. No enteric contrast is seen in the bladder. These findings were actually better demonstrated on yesterday's study with contrast. There is a mild left ureteral distention extending down to the inflammatory changes. The inflammatory process extends around the left ovary, but there is no evidence of a tubo-ovarian abscess. There is trace ascites in the posterior deep pelvis. There is no free air or localizing fluid collection. No adenopathy or soft tissue mass and no incarcerated hernia. VASCULATURE: There is moderate aortoiliac calcific plaque. No aneurysm is seen. IMPRESSION: 1. Active sigmoid diverticulitis, minimally improved, with associated soft tissue thickening extending to the left bladder dome consistent with a fistula; no contrast identified within the fistula on this exam, with better demonstration on the prior contrast-enhanced study. 2. Cystitis with trace intravesical air. 3. Mild left ureteral distention extending down to the  inflammatory changes. 4. Inflammatory changes surrounding the left ovary without evidence of tubo-ovarian abscess. 5. Trace pelvic ascites. Electronically signed by: Francis Quam MD 09/09/2024 11:21 PM EST RP Workstation: [id number]   CT ABDOMEN PELVIS W CONTRAST Result Date: 09/08/2024 CLINICAL DATA:  Acute abdominal pain.  Bloating and gas. EXAM: CT ABDOMEN AND PELVIS WITH CONTRAST TECHNIQUE: Multidetector CT imaging of the abdomen and pelvis was performed using the standard protocol following bolus administration of intravenous contrast. RADIATION DOSE REDUCTION: This exam was performed according to the departmental dose-optimization program which includes automated exposure control, adjustment of the mA and/or kV according to patient size and/or use of iterative reconstruction technique. CONTRAST:  OMNIPAQUE  IOHEXOL  300 MG/ML  SOLN COMPARISON:  09/02/2024. FINDINGS: Lower chest: The heart is normal in size and scattered coronary  artery calcifications are noted. Atelectasis is present at the lung bases. Hepatobiliary: No focal liver abnormality is seen. Fatty infiltration of the liver is noted. Status post cholecystectomy. No biliary dilatation. Pancreas: Unremarkable. No pancreatic ductal dilatation or surrounding inflammatory changes. Spleen: Normal in size without focal abnormality. Adrenals/Urinary Tract: The adrenal glands are within normal limits. No renal calculus bilaterally. There is mild hydroureteronephrosis on the left of the collecting system to the level of inflammatory changes in the left lower quadrant. Urothelial enhancement is noted in the mid to distal ureter. No obstructive uropathy is seen. A Foley catheter is present in the urinary bladder in the bladder is decompressed. Soft tissue thickening with enhancement is noted along the superior left aspect of the urinary bladder in the region of inflammatory changes. Air is identified in the urinary bladder, possibly related to fistula or Foley catheter placement. Stomach/Bowel: The stomach is within normal limits. There is no bowel obstruction. Colonic diverticulosis is again seen with bowel wall thickening and surrounding inflammatory changes in the left lower quadrant. Foci of contained air and soft tissue density are noted in the left lower  quadrant adjacent to the sigmoid colon forming a tract to the urinary bladder and ovary on the left, not significantly changed from the prior exam. Appendix appears normal. Vascular/Lymphatic: Aortic atherosclerosis. No enlarged abdominal or pelvic lymph nodes. Reproductive: Status post hysterectomy. No adnexal masses. Other: There is a trace amount of free fluid in the left pericolic gutter. A small fat containing umbilical hernia is noted. Musculoskeletal: Degenerative changes are present in the thoracolumbar spine. No acute osseous abnormality. IMPRESSION: 1. Acute sigmoid diverticulitis with contained collection of air with  associated soft tissue density in the left lower quadrant extending to the urinary bladder and ovary on the left, concerning for fistula formation. Enteric contrast has not reached this region of the sigmoid colon at the time of this scan and delayed imaging should be considered once contrast has progressed through the colon to assess for fistula. 2. Mild hydroureteronephrosis on the left to the level of inflammatory changes in the left lower quadrant, likely related to local inflammatory changes. Correlation with urinalysis is suggested to exclude superimposed infection. 3. Hepatic steatosis. 4. Aortic atherosclerosis and coronary artery calcifications. Electronically Signed   By: Leita Birmingham M.D.   On: 09/08/2024 14:20   VAS US  LOWER EXTREMITY VENOUS (DVT) Result Date: 09/04/2024  Lower Venous DVT Study Patient Name:  SAIRA KRAMME  Date of Exam:   09/04/2024 Medical Rec #: [id number]        Accession #:    [id number] Date of Birth: [birth year]        Patient Gender: F Patient Age:   57 years Exam Location:  [facility] Procedure:      VAS US  LOWER EXTREMITY VENOUS (DVT) Referring Phys: BRIGIDA BUREAU --------------------------------------------------------------------------------  Indications: Pain.  Risk Factors: None identified. Comparison Study: No prior studies. Performing Technologist: Cordella Collet RVT  Examination Guidelines: A complete evaluation includes B-mode imaging, spectral Doppler, color Doppler, and power Doppler as needed of all accessible portions of each vessel. Bilateral testing is considered an integral part of a complete examination. Limited examinations for reoccurring indications may be performed as noted. The reflux portion of the exam is performed with the patient in reverse Trendelenburg.  +---------+---------------+---------+-----------+----------+--------------+ RIGHT    CompressibilityPhasicitySpontaneityPropertiesThrombus Aging  +---------+---------------+---------+-----------+----------+--------------+ CFV      Full           Yes      Yes                                 +---------+---------------+---------+-----------+----------+--------------+ SFJ      Full                                                        +---------+---------------+---------+-----------+----------+--------------+ FV Prox  Full                                                        +---------+---------------+---------+-----------+----------+--------------+ FV Mid   Full                                                        +---------+---------------+---------+-----------+----------+--------------+  FV DistalFull                                                        +---------+---------------+---------+-----------+----------+--------------+ PFV      Full                                                        +---------+---------------+---------+-----------+----------+--------------+ POP      Full           Yes      Yes                                 +---------+---------------+---------+-----------+----------+--------------+ PTV      Full                                                        +---------+---------------+---------+-----------+----------+--------------+ PERO     Full                                                        +---------+---------------+---------+-----------+----------+--------------+   +---------+---------------+---------+-----------+----------+--------------+ LEFT     CompressibilityPhasicitySpontaneityPropertiesThrombus Aging +---------+---------------+---------+-----------+----------+--------------+ CFV      Full           Yes      Yes                                 +---------+---------------+---------+-----------+----------+--------------+ SFJ      Full                                                         +---------+---------------+---------+-----------+----------+--------------+ FV Prox  Full                                                        +---------+---------------+---------+-----------+----------+--------------+ FV Mid   Full                                                        +---------+---------------+---------+-----------+----------+--------------+ FV DistalFull                                                        +---------+---------------+---------+-----------+----------+--------------+  PFV      Full                                                        +---------+---------------+---------+-----------+----------+--------------+ POP      Full           Yes      Yes                                 +---------+---------------+---------+-----------+----------+--------------+ PTV      Full                                                        +---------+---------------+---------+-----------+----------+--------------+ PERO     Full                                                        +---------+---------------+---------+-----------+----------+--------------+     Summary: RIGHT: - There is no evidence of deep vein thrombosis in the lower extremity.  - No cystic structure found in the popliteal fossa.  LEFT: - There is no evidence of deep vein thrombosis in the lower extremity.  - No cystic structure found in the popliteal fossa.  *See table(s) above for measurements and observations. Electronically signed by Lonni Gaskins MD on 09/04/2024 at 3:29:44 PM.    Final    DG Foot 2 Views Right Result Date: 09/04/2024 EXAM: 1 or 2 VIEW(S) XRAY OF THE FOOT 09/04/2024 01:16:00 PM COMPARISON: None available. CLINICAL HISTORY: Pain FINDINGS: BONES AND JOINTS: No acute fracture. Moderate hallux valgus deformity. Mild degenerative changes identified at the 1st MTP joint. 2nd through 4th hammertoe deformities. SOFT TISSUES: The soft tissues are unremarkable.  IMPRESSION: 1. Moderate hallux valgus deformity 2nd through 4th hammertoe deformities. 2. No acute fracture or subluxation. Electronically signed by: Waddell Calk MD 09/04/2024 02:48 PM EST RP Workstation: [id number]   CT Renal Stone Study Result Date: 09/02/2024 EXAM: CT ABDOMEN AND PELVIS WITHOUT CONTRAST 09/02/2024 04:11:31 PM TECHNIQUE: CT of the abdomen and pelvis was performed without the administration of intravenous contrast. Multiplanar reformatted images are provided for review. Automated exposure control, iterative reconstruction, and/or weight-based adjustment of the mA/kV was utilized to reduce the radiation dose to as low as reasonably achievable. COMPARISON: CT dated 08/07/2014. CLINICAL HISTORY: Abdominal/flank pain, stone suspected; left sided abd pain. FINDINGS: LOWER CHEST: Normal heart size. Clear lung bases. LIVER: Normal, without mass or intrahepatic biliary duct dilatation. GALLBLADDER AND BILE DUCTS: Cholecystectomy. No biliary ductal dilatation. SPLEEN: Normal in size and morphology. PANCREAS: Normal, without duct dilatation or acute inflammation. ADRENAL GLANDS: No acute abnormality. KIDNEYS, URETERS AND BLADDER: No renal mass or hydronephrosis. No hydroureter or ureteric stone. No renal calculi. Mild bladder wall thickening and moderate pericystic edema including on image 64 of series 2 .subtle gas in the nondependent bladder. GI AND BOWEL: Normal stomach, without wall thickening. Normal small bowel caliber. Scattered colonic diverticula. Wall thickening involving the sigmoid is moderate. Mild  edema surrounds the proximal sigmoid including on 54 of series 2. Extraluminal soft tissue density and gas tract from the inflamed sigmoid colon to the urinary bladder, including on images 54 through 58 of series 2. Soft tissue in this region on image 54 may represent the left ovary. Normal appendix. PERITONEUM AND RETROPERITONEUM: No ascites. No free air. VASCULATURE: Aorta is normal in  caliber. Aortic atherosclerosis. LYMPH NODES: No lymphadenopathy. REPRODUCTIVE ORGANS: Hysterectomy. BONES AND SOFT TISSUES: Mild osteopenia. Multiple lumbar and lumbosacral disc bulges. No acute osseous abnormality. No focal soft tissue abnormality. IMPRESSION: 1. Findings suspicious for sigmoid diverticulitis, complicated by fistulous communication to the bladder with secondary cystitis. Extraluminal gas and soft tissue traversing the left hemipelvis, possibly involving the left ovary. This is suboptimally evaluated secondary to stone study technique. Consider dedicated postcontrast abdominal pelvic CT. 2. No urinary tract calculi or hydronephrosis. 3. Aortic atherosclerosis (ICD10-I70.0). Electronically signed by: Rockey Kilts MD 09/02/2024 04:51 PM EST RP Workstation: [id number]      Subjective: Patient seen and examined at bedside today.  Hemodynamically stable.  Tolerating soft diet.  Did not complain of abdominal pain, nausea or vomiting today.  Medically stable for discharge.  Discharge plan discussed in detail at bedside.  She will follow-up with general surgery  as an outpatient  Discharge Exam: Vitals:   09/16/24 0503 09/16/24 1050  BP: 130/74   Pulse: (!) 104   Resp: 18   Temp: 98 F (36.7 C)   SpO2: 98% 98%   Vitals:   09/15/24 2109 09/15/24 2113 09/16/24 0503 09/16/24 1050  BP:   130/74   Pulse:   (!) 104   Resp:   18   Temp:   98 F (36.7 C)   TempSrc:      SpO2: 97% 97% 98% 98%  Weight:      Height:        General: Pt is alert, awake, not in acute distress Cardiovascular: RRR, S1/S2 +, no rubs, no gallops Respiratory: CTA bilaterally, no wheezing, no rhonchi Abdominal: Soft, NT, ND, bowel sounds + Extremities: trace bilateral lower extremity edema, no cyanosis    The results of significant diagnostics from this hospitalization (including imaging, microbiology, ancillary and laboratory) are listed below for reference.     Microbiology: No results found for  this or any previous visit (from the past 240 hours).   Labs: BNP (last 3 results) Recent Labs    01/29/24 2120  BNP 37.8   Basic Metabolic Panel: Recent Labs  Lab 09/11/24 0451 09/15/24 0411  NA 137 137  K 4.3 4.3  CL 99 98  CO2 33* 33*  GLUCOSE 92 91  BUN <5* 7*  CREATININE 0.46 0.49  CALCIUM  8.4* 8.7*   Liver Function Tests: No results for input(s): AST, ALT, ALKPHOS, BILITOT, PROT, ALBUMIN in the last 168 hours. No results for input(s): LIPASE, AMYLASE in the last 168 hours. No results for input(s): AMMONIA in the last 168 hours. CBC: Recent Labs  Lab 09/10/24 0423 09/11/24 0451 09/12/24 0433 09/15/24 0411  WBC 8.6 8.5 9.0 10.9*  NEUTROABS 5.5 5.4 5.3  --   HGB 9.3* 9.4* 9.9* 9.5*  HCT 29.9* 30.1* 31.3* 30.1*  MCV 92.0 91.8 91.8 91.2  PLT 444* 447* 468* 530*   Cardiac Enzymes: No results for input(s): CKTOTAL, CKMB, CKMBINDEX, TROPONINI in the last 168 hours. BNP: Invalid input(s): POCBNP CBG: No results for input(s): GLUCAP in the last 168 hours. D-Dimer No results for input(s): DDIMER in the last 72  hours. Hgb A1c No results for input(s): HGBA1C in the last 72 hours. Lipid Profile No results for input(s): CHOL, HDL, LDLCALC, TRIG, CHOLHDL, LDLDIRECT in the last 72 hours. Thyroid  function studies No results for input(s): TSH, T4TOTAL, T3FREE, THYROIDAB in the last 72 hours.  Invalid input(s): FREET3 Anemia work up No results for input(s): VITAMINB12, FOLATE, FERRITIN, TIBC, IRON , RETICCTPCT in the last 72 hours. Urinalysis    Component Value Date/Time   COLORURINE STRAW (A) 09/09/2024 0611   APPEARANCEUR CLEAR 09/09/2024 0611   LABSPEC 1.008 09/09/2024 0611   PHURINE 7.0 09/09/2024 0611   GLUCOSEU NEGATIVE 09/09/2024 0611   GLUCOSEU NEGATIVE 12/22/2021 1500   HGBUR NEGATIVE 09/09/2024 0611   BILIRUBINUR NEGATIVE 09/09/2024 0611   KETONESUR NEGATIVE 09/09/2024 0611    PROTEINUR NEGATIVE 09/09/2024 0611   UROBILINOGEN 0.2 12/22/2021 1500   NITRITE NEGATIVE 09/09/2024 0611   LEUKOCYTESUR MODERATE (A) 09/09/2024 0611   Sepsis Labs Recent Labs  Lab 09/10/24 0423 09/11/24 0451 09/12/24 0433 09/15/24 0411  WBC 8.6 8.5 9.0 10.9*   Microbiology No results found for this or any previous visit (from the past 240 hours).  Please note: You were cared for by a hospitalist during your hospital stay. Once you are discharged, your primary care physician will handle any further medical issues. Please note that NO REFILLS for any discharge medications will be authorized once you are discharged, as it is imperative that you return to your primary care physician (or establish a relationship with a primary care physician if you do not have one) for your post hospital discharge needs so that they can reassess your need for medications and monitor your lab values.    Time coordinating discharge: 40 minutes  SIGNED:   Ivonne Mustache, MD  [facility] 09/16/2024, 10:56 AM Pager [phone]  If 7PM-7AM, please contact night-coverage [email or web address] Password [email or web address]

## 2024-09-16 NOTE — Final Progress Note (Signed)
 IV removed. AVS reviewed with patient whom verbalized understanding and denies further needs.

## 2024-09-16 NOTE — Plan of Care (Signed)

## 2024-09-17 ENCOUNTER — Telehealth: Payer: Self-pay

## 2024-09-17 NOTE — Telephone Encounter (Signed)
 Copied from CRM #[id number]. Topic: Clinical - Medical Advice >> Sep 17, 2024 11:42 AM Anairis L wrote: Reason for CRM: Patient is requesting a letter for work letting them know she was in the hospital  the last two weeks and discharge 09/16/2024  Please email to [email or web address]

## 2024-09-17 NOTE — Transitions of Care (Post Inpatient/ED Visit) (Signed)
   09/17/2024  Name: Lynn Kline MRN: [id number] DOB: 08-14-1956  Today's TOC FU Call Status: Today's TOC FU Call Status:: Unsuccessful Call (1st Attempt) Unsuccessful Call (1st Attempt) Date: 09/17/24  Attempted to reach the patient regarding the most recent Inpatient/ED visit.  Follow Up Plan: Additional outreach attempts will be made to reach the patient to complete the Transitions of Care (Post Inpatient/ED visit) call.   Alan Ee, RN, BSN, CEN [employer]- Transition of Care Team.  [facility] [phone]

## 2024-09-18 ENCOUNTER — Encounter: Payer: Self-pay | Admitting: Internal Medicine

## 2024-09-18 ENCOUNTER — Telehealth: Payer: Self-pay | Admitting: *Deleted

## 2024-09-18 NOTE — Telephone Encounter (Signed)
 Please advise

## 2024-09-18 NOTE — Transitions of Care (Post Inpatient/ED Visit) (Signed)
 09/18/2024  Name: EVELENE ROUSSIN MRN: [id number] DOB: [birth year]  Today's TOC FU Call Status: Today's TOC FU Call Status:: Successful TOC FU Call Completed TOC FU Call Complete Date: 09/18/24  Patient's Name and Date of Birth confirmed. Name, DOB  Transition Care Management Follow-up Telephone Call Date of Discharge: 09/16/24 Discharge Facility: [facility]) Type of Discharge: Inpatient Admission Primary Inpatient Discharge Diagnosis:: diverticulitis; UTI; (L) abdominal pain: colovesicle fistula- outpatient surgery planned How have you been since you were released from the hospital?: Better (I am doing fine now but am taking it easy; I work from home and am back at work now.  Independent and have plenty of help through my son and my sister.  No concerns or issues.  Can't really take phone calls because of my work schedule) Any questions or concerns?: No  Items Reviewed: Did you receive and understand the discharge instructions provided?: Yes (thoroughly reviewed with patient who verbalizes good understanding of same) Medications obtained,verified, and reconciled?: Yes (Medications Reviewed) (Full medication reconciliation/ review completed; no concerns or discrepancies identified; confirmed patient obtained/ is taking all newly Rx'd medications as instructed; self-manages medications and denies questions/ concerns around medications today) Any new allergies since your discharge?: No Dietary orders reviewed?: Yes Type of Diet Ordered:: Soft diet for now Do you have support at home?: Yes People in Home [RPT]: child(ren), adult Name of Support/Comfort Primary Source: Reports independent in self-care activities; resides with supportive adult son- assists as/ if needed/ indicated; local sister assists also, as indicated  Medications Reviewed Today: Medications Reviewed Today     Reviewed by Dinora Hemm M, RN (Registered Nurse) on 09/18/24 at 1430  Med List Status: <None>    Medication Order Taking? Sig Documenting Provider Last Dose Status Informant  albuterol  (PROVENTIL ) (2.5 MG/3ML) 0.083% nebulizer solution [id number] Yes Take 3 mLs (2.5 mg total) by nebulization every 4 (four) hours as needed for wheezing or shortness of breath. [name], MD  Active   ALPRAZolam  (XANAX ) 0.25 MG tablet [id number] Yes TAKE 1 TABLET(0.25 MG) BY MOUTH TWICE DAILY AS NEEDED [name], MD  Active   budesonide -glycopyrrolate -formoterol  (BREZTRI  AEROSPHERE) 160-9-4.8 MCG/ACT AERO inhaler [id number] Yes Inhale 2 puffs into the lungs in the morning and at bedtime. Cobb, Comer GAILS, NP  Active Self  cholecalciferol (VITAMIN D3) 25 MCG (1000 UNIT) tablet [id number] Yes Take 1,000 Units by mouth daily. [provider]  Active Self  cyanocobalamin  2000 MCG tablet [id number] Yes Take 1 tablet (2,000 mcg total) by mouth daily. [name], MD  Active Self  ferrous sulfate  325 (65 FE) MG EC tablet [id number] Yes Take 1 tablet (325 mg total) by mouth daily with breakfast. Jillian Buttery, MD  Active   furosemide (LASIX) 20 MG tablet [id number] Yes Take 1 tablet (20 mg total) by mouth daily. Jillian Buttery, MD  Active   gabapentin  (NEURONTIN ) 100 MG capsule [id number] Yes Take 2 capsules (200 mg total) by mouth at bedtime. Jillian Buttery, MD  Active   ibuprofen (ADVIL) 400 MG tablet [id number] Yes Take 1 tablet (400 mg total) by mouth every 6 (six) hours as needed for mild pain (pain score 1-3) or moderate pain (pain score 4-6). Jillian Buttery, MD  Active   OXYGEN  [id number] Yes Inhale 3 L/min into the lungs continuous. [provider]  Active Self  pantoprazole  (PROTONIX ) 40 MG tablet [id number] Yes Take 1 tablet (40 mg total) by mouth daily. Jillian Buttery, MD  Active   polycarbophil (FIBERCON) 625 MG  tablet [id number] Yes Take 1 tablet (625 mg total) by mouth 2 (two) times daily. Jillian Buttery, MD  Active   rosuvastatin  (CRESTOR ) 10 MG tablet [id number] Yes Take 1  tablet (10 mg total) by mouth daily. Joshua Debby CROME, MD  Active Self  VENTOLIN  HFA 108 (90 Base) MCG/ACT inhaler [id number] Yes INHALE 1 TO 2 PUFFS INTO THE LUNGS EVERY 6 HOURS AS NEEDED FOR WHEEZING OR SHORTNESS OF SHERIDA Joshua Debby CROME, MD  Active Self           Home Care and Equipment/Supplies: Were Home Health Services Ordered?: No Any new equipment or medical supplies ordered?: No  Functional Questionnaire: Do you need assistance with bathing/showering or dressing?: No (family assisting as indicated) Do you need assistance with meal preparation?: No (family assisting as indicated) Do you need assistance with eating?: No Do you have difficulty maintaining continence: No Do you need assistance with getting out of bed/getting out of a chair/moving?: No Do you have difficulty managing or taking your medications?: No  Follow up appointments reviewed: PCP Follow-up appointment confirmed?: Yes Date of PCP follow-up appointment?: 09/20/24 Follow-up Provider: PCP- covering provider Dr. Geofm Specialist [facility] Follow-up appointment confirmed?: Yes Date of Specialist follow-up appointment?: 10/14/24 Follow-Up Specialty Provider:: pulmonary provider Do you need transportation to your follow-up appointment?: No Do you understand care options if your condition(s) worsen?: Yes-patient verbalized understanding  SDOH Interventions Today    Flowsheet Row Most Recent Value  SDOH Interventions   Food Insecurity Interventions Intervention Not Indicated  [During TOC call- patient denies food insecurity]  Housing Interventions Intervention Not Indicated  Transportation Interventions Intervention Not Indicated  [Drives self at baseline: family assisting as indicated after recent hospital visit]  Utilities Interventions Intervention Not Indicated   See TOC assessment tabs for additional assessment/ TOC intervention information Provided education around potential benefits of OTC probiotics in  setting of recent extended antibiotic therapy- encouraged pt. to discuss if probiotics are indicated during PCP hospital follow up office visit 09/20/24 Reinforced signs/ symptoms UTI along with corresponding action plan  Reinforced need for patient to stay hydrated along with signs/ symptoms dehydration and corresponding action plan Confirmed/ reinforced action plan for shortness of breath: verbalizes excellent ongoing understanding/ adherence to same Reinforced/ provided education around safe use/ maintenance of home O2 Confirmed patient is NOT currently obtaining daily weights at home: provided education / rationale for daily weight monitoring at home as earliest indicator of fluid retention, along with weight gain guidelines/ action plan for weight gain; importance of taking diuretic as prescribed: reports don't have heart problems; coached patient around talking points to cover with PCP at time of hospital follow up visit re: whether she will be on diuretics long-term; whether she needs to monitor- record daily weights at home: confirmed no diagnosis of CHF  Patient declines need for ongoing/ further care management/ coordination outreach; declines enrollment in 30-day TOC program- declines taking my direct phone number should needs/ concerns arise post-TOC call   Pls call/ message for questions,  Kielan Dreisbach Mckinney Lashaye Fisk, RN, BSN, CCRN Alumnus RN Care Manager  Transitions of Care  [facility] - [facility] [phone]: direct office

## 2024-09-19 ENCOUNTER — Encounter: Payer: Self-pay | Admitting: Internal Medicine

## 2024-09-19 NOTE — Progress Notes (Signed)
 Subjective:    Patient ID: Lynn Kline, female    DOB: 1956/02/22, 68 y.o.   MRN: [id number]     HPI Lynn Kline is here for follow up from the hospital.   Admitted 11/17 - 12/01  Presented with urinary frequency and pain in lower abdomen.  She felt like there was a ball in her abdomen that prevented her from having a BM.  She had urinary incontinence which was new x few weeks.  She denied any nausea, vomiting or diarrhea.  She was worried about a urinary tract infection.  CT scan showed diverticulitis and a fistula between the intestines and bladder.  There was some concern for free air.  Sigmoid diverticulitis, fistula with bladder, possible free air CT scan showed sigmoid diverticulitis complicated by fistula communicating to the bladder with secondary cystitis CT pelvis without contrast 11/24 showed minimal improvement in sigmoid diverticulitis with associated soft tissue thickening extending to the left bladder dome consistent with a fistula, cystitis with trace intravesicular air, mild left ureteral distention extending down to the inflammatory changes, inflammatory changes surrounding the left ovary without evidence of tubo-ovarian abscess General Surgery consulted Urology consulted-recommended no intervention.  Recommended renal ultrasound in a couple of weeks and cystoscopy as an outpatient Started on Rocephin  and Flagyl -completed 2 weeks N.p.o. initially-transition to soft diet IVF To follow-up with surgery-plan for colonoscopy and then sigmoid colectomy and takedown of the Colovesicular fistula-likely in 6 weeks  Acute cystitis UA suspicious for UTI, urine culture grew multiple species Completed antibiotics.  IV in the hospital   Emphysema without exacerbation She was at her baseline, daily mild wheeze Continued Breztri  and albuterol  nebulizer treatments Continued 3 L oxygen  which is her baseline at home Pulmonary did see her  Iron  deficiency anemia Hemoglobin 9,  low iron  Continue iron  supplementation  Left wrist pain Improved Ultrasound did not show any abscess or fluid collection X-ray with arthritis Cool compress, NSAIDs, Tylenol   Lower extremity edema Given IV Lasix  40 mg x 1 Compression socks Continue Lasix  20 mg daily at home  Anxiety Continued Xanax  twice daily as needed  Hyperlipidemia Crestor  held pending possible surgery     Discussed the use of AI scribe software for clinical note transcription with the patient, who gave verbal consent to proceed.  History of Present Illness Lynn Kline is a 68 year old female with diverticulitis and a rectovesical fistula who presents for follow-up after hospitalization.  She was hospitalized for two weeks due to urinary symptoms and lower abdominal pain. She was diagnosed with diverticulitis and a rectovesical fistula. She received antibiotics and was seen by multiple specialists.  Currently, she experiences occasional bladder spasms and frequent urination, sometimes with urgency that may lead to incontinence. No fever, chills, or blood in the urine. Her urine is mostly clear with occasional yellow color. She is on a soft diet, avoiding red meats, and supplements with Ensure for protein intake. She experiences fullness quickly and eats small meals or snacks throughout the day.  She denies abdominal pain but reports loose stools that are very dark. No nausea or blood in the stool.  She has resumed work from home, sitting in front of a computer, and elevates her legs when possible.  She experiences leg swelling, which worsened after hospitalization. She elevates her legs at night, which helps reduce swelling. She takes furosemide  daily. No shortness of breath or chest pain beyond her baseline emphysema symptoms.  She reports wrist pain and swelling, which  began during hospitalization. She uses ibuprofen  and compression wraps for relief. An x-ray showed no fractures.    Medications and  allergies reviewed with patient and updated if appropriate.  Current Outpatient Medications on File Prior to Visit  Medication Sig Dispense Refill   albuterol  (PROVENTIL ) (2.5 MG/3ML) 0.083% nebulizer solution Take 3 mLs (2.5 mg total) by nebulization every 4 (four) hours as needed for wheezing or shortness of breath. 75 mL 1   ALPRAZolam  (XANAX ) 0.25 MG tablet TAKE 1 TABLET(0.25 MG) BY MOUTH TWICE DAILY AS NEEDED 60 tablet 0   budesonide -glycopyrrolate -formoterol  (BREZTRI  AEROSPHERE) 160-9-4.8 MCG/ACT AERO inhaler Inhale 2 puffs into the lungs in the morning and at bedtime. 3 each 3   cholecalciferol (VITAMIN D3) 25 MCG (1000 UNIT) tablet Take 1,000 Units by mouth daily.     cyanocobalamin  2000 MCG tablet Take 1 tablet (2,000 mcg total) by mouth daily. 90 tablet 0   ferrous sulfate  325 (65 FE) MG EC tablet Take 1 tablet (325 mg total) by mouth daily with breakfast. 60 tablet 0   furosemide  (LASIX ) 20 MG tablet Take 1 tablet (20 mg total) by mouth daily. 30 tablet 0   gabapentin  (NEURONTIN ) 100 MG capsule Take 2 capsules (200 mg total) by mouth at bedtime. 30 capsule 0   ibuprofen  (ADVIL ) 400 MG tablet Take 1 tablet (400 mg total) by mouth every 6 (six) hours as needed for mild pain (pain score 1-3) or moderate pain (pain score 4-6). 20 tablet 0   OXYGEN  Inhale 3 L/min into the lungs continuous.     pantoprazole  (PROTONIX ) 40 MG tablet Take 1 tablet (40 mg total) by mouth daily. 30 tablet 0   polycarbophil (FIBERCON) 625 MG tablet Take 1 tablet (625 mg total) by mouth 2 (two) times daily. 60 tablet 0   rosuvastatin  (CRESTOR ) 10 MG tablet Take 1 tablet (10 mg total) by mouth daily. 90 tablet 1   VENTOLIN  HFA 108 (90 Base) MCG/ACT inhaler INHALE 1 TO 2 PUFFS INTO THE LUNGS EVERY 6 HOURS AS NEEDED FOR WHEEZING OR SHORTNESS OF BREATH 18 g 3   No current facility-administered medications on file prior to visit.     Review of Systems  Constitutional:  Negative for chills and fever.       Soft diet   Respiratory:  Positive for cough (chronic - no change) and shortness of breath (chronic - no change).   Cardiovascular:  Positive for leg swelling. Negative for chest pain and palpitations.  Gastrointestinal:  Negative for abdominal pain, blood in stool (dark - not black), diarrhea (very loose stool) and nausea.       Fecal incontinence  Genitourinary:  Positive for frequency and urgency. Negative for hematuria.       Bladder spasms.  Urine is clear  Neurological:  Negative for light-headedness and headaches.       Objective:   Vitals:   09/20/24 1315  BP: 110/78  Pulse: 96  Temp: 98 F (36.7 C)  SpO2: 91%   BP Readings from Last 3 Encounters:  09/20/24 110/78  09/16/24 130/74  07/17/24 138/82   Wt Readings from Last 3 Encounters:  09/20/24 125 lb (56.7 kg)  09/11/24 127 lb (57.6 kg)  07/17/24 127 lb 9.6 oz (57.9 kg)   Body mass index is 22.86 kg/m.    Physical Exam Constitutional:      General: She is not in acute distress.    Appearance: Normal appearance.  HENT:     Head: Normocephalic and atraumatic.  Eyes:     Conjunctiva/sclera: Conjunctivae normal.  Cardiovascular:     Rate and Rhythm: Normal rate and regular rhythm.     Heart sounds: Normal heart sounds.  Pulmonary:     Effort: Pulmonary effort is normal. No respiratory distress.     Breath sounds: No wheezing.     Comments: Diffusely decreased BS bl Abdominal:     General: There is no distension.     Palpations: Abdomen is soft.     Tenderness: There is abdominal tenderness (slight tenderness LLQ and suprapubic region). There is no guarding or rebound.  Musculoskeletal:     Cervical back: Neck supple.     Right lower leg: Edema (2+ pitting edema) present.     Left lower leg: Edema (2+ pitting edema) present.  Lymphadenopathy:     Cervical: No cervical adenopathy.  Skin:    General: Skin is warm and dry.     Findings: No rash.  Neurological:     Mental Status: She is alert. Mental status is at  baseline.  Psychiatric:        Mood and Affect: Mood normal.        Behavior: Behavior normal.        Lab Results  Component Value Date   WBC 10.9 (H) 09/15/2024   HGB 9.5 (L) 09/15/2024   HCT 30.1 (L) 09/15/2024   PLT 530 (H) 09/15/2024   GLUCOSE 91 09/15/2024   CHOL 206 (H) 11/29/2023   TRIG 88.0 11/29/2023   HDL 89.60 11/29/2023   LDLDIRECT 141.0 11/15/2011   LDLCALC 99 11/29/2023   ALT 6 09/05/2024   AST 11 (L) 09/05/2024   NA 137 09/15/2024   K 4.3 09/15/2024   CL 98 09/15/2024   CREATININE 0.49 09/15/2024   BUN 7 (L) 09/15/2024   CO2 33 (H) 09/15/2024   TSH 1.49 04/16/2024   INR 0.8 04/14/2023   HGBA1C 5.8 11/29/2023   DG Wrist 2 Views Left CLINICAL DATA:  Left wrist swelling  EXAM: LEFT WRIST - 2 VIEW  COMPARISON:  None Available.  FINDINGS: There is no evidence of fracture or dislocation. Degenerative changes at the STT joint and thumb carpometacarpal joint. Amorphous calcification along the radiocarpal joint. Soft tissues are unremarkable.  IMPRESSION: 1. No acute fracture or dislocation. 2. Degenerative changes of the wrist and amorphous calcification along the radiocarpal joint, which may represent chondrocalcinosis.  Electronically Signed   By: Limin  Xu M.D.   On: 09/14/2024 13:52    Assessment & Plan:    See Problem List for Assessment and Plan of chronic medical problems.     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 No current urinary symptoms of concern, but occasional bladder spasms. Stool is dark, likely due to iron  supplementation. - Continue follow-up with surgery, urology and gastroenterology. - Proceed with scheduled surgery on February 4th. - Monitor for signs of infection, especially urinary symptoms, before surgery.  Diverticulitis Completed antibiotics.  Has some discomfort in LLQ and suprapubic  region -follow up with surgery, GI   Lower extremity edema Likely exacerbated by prolonged bed rest during hospitalization. Swelling noted, especially during the day, with improvement overnight. - continue lasix  20 mg daily - start using compression stockings. - Elevate legs when sitting or lying down. - Encouraged walking and movement to reduce swelling.  Chronic obstructive pulmonary disease (COPD)/emphysema, severe Chronic obstructive pulmonary disease with baseline cough and occasional wheezing. - on  continuous supplemental oxygen   Iron  deficiency anemia Managed with iron  supplementation. Stool is dark, likely due to iron  pills. - Continue iron  supplementation.  Degenerative joint disease of wrist Degenerative joint disease of the wrist with recent flare-up during hospitalization. No fractures noted on x-ray. - Continue ibuprofen  for pain management. - Use compression devices as needed for swelling.    Note given for work

## 2024-09-20 ENCOUNTER — Ambulatory Visit: Admitting: Internal Medicine

## 2024-09-20 VITALS — BP 110/78 | HR 96 | Temp 98.0°F | Ht 62.0 in | Wt 125.0 lb

## 2024-09-20 DIAGNOSIS — J449 Chronic obstructive pulmonary disease, unspecified: Secondary | ICD-10-CM

## 2024-09-20 DIAGNOSIS — M19032 Primary osteoarthritis, left wrist: Secondary | ICD-10-CM | POA: Diagnosis not present

## 2024-09-20 DIAGNOSIS — E538 Deficiency of other specified B group vitamins: Secondary | ICD-10-CM

## 2024-09-20 DIAGNOSIS — K5792 Diverticulitis of intestine, part unspecified, without perforation or abscess without bleeding: Secondary | ICD-10-CM

## 2024-09-20 DIAGNOSIS — N321 Vesicointestinal fistula: Secondary | ICD-10-CM

## 2024-09-20 DIAGNOSIS — R600 Localized edema: Secondary | ICD-10-CM | POA: Diagnosis not present

## 2024-09-20 DIAGNOSIS — D509 Iron deficiency anemia, unspecified: Secondary | ICD-10-CM | POA: Diagnosis not present

## 2024-09-20 NOTE — Telephone Encounter (Signed)
 I have spoken to patient to advise of recommended colonoscopy and have provided her with time/date/location for procedure as well as appointment information for previsit. She verbalizes understanding of this information.

## 2024-09-20 NOTE — Telephone Encounter (Signed)
 Left message for patient to call back

## 2024-09-20 NOTE — Patient Instructions (Addendum)
        Medications changes include :   None

## 2024-09-23 ENCOUNTER — Other Ambulatory Visit: Payer: Self-pay | Admitting: Urology

## 2024-10-14 ENCOUNTER — Ambulatory Visit

## 2024-10-14 VITALS — BP 117/78 | HR 98 | Temp 97.7°F | Ht 66.0 in | Wt 120.0 lb

## 2024-10-14 DIAGNOSIS — Z87891 Personal history of nicotine dependence: Secondary | ICD-10-CM

## 2024-10-14 DIAGNOSIS — R911 Solitary pulmonary nodule: Secondary | ICD-10-CM

## 2024-10-14 DIAGNOSIS — J4489 Other specified chronic obstructive pulmonary disease: Secondary | ICD-10-CM

## 2024-10-14 NOTE — Patient Instructions (Signed)
" °  VISIT SUMMARY: Today, we discussed your chronic obstructive pulmonary disease (COPD) with asthma and the new lung nodule found in your recent CT scan. We reviewed your current medications and discussed potential future treatments and tests.  YOUR PLAN: -CHRONIC OBSTRUCTIVE PULMONARY DISEASE WITH ASTHMA: COPD with asthma is a chronic lung condition that makes it hard to breathe. You will continue using Breztri  and your albuterol  inhaler. We discussed the possibility of starting pulmonary rehab after you retire and trying nebulized Daliresp  if your insurance covers it. We will also repeat your pulmonary function tests to see how your COPD is progressing. Please contact us  if you experience any lung tightness or breathing difficulties.  -PULMONARY NODULE, LEFT UPPER LOBE: A pulmonary nodule is a small growth in the lung. Your recent CT scan showed a new 7 mm nodule in your left upper lobe. We will schedule another CT scan around January 20th or early February to monitor it. If the nodule changes, we may need to do a biopsy to check for any issues.  INSTRUCTIONS: Please schedule a CT scan around January 20th or early February to monitor the lung nodule. We will also arrange for repeat pulmonary function tests to assess your COPD stage. Contact us  if you experience any lung tightness or breathing difficulties.        Contains text generated by Abridge.   "

## 2024-10-14 NOTE — Progress Notes (Signed)
 "   Subjective:   PATIENT ID: Lynn Kline GENDER: female DOB: [birth year], MRN: [id number]   HPI Discussed the use of AI scribe software for clinical note transcription with the patient, who gave verbal consent to proceed.  History of Present Illness Lynn Kline is a 68 year old female with COPD who presents for follow-up of a lung nodule.  She is on continuous oxygen  therapy at three liters per minute. Her current medications include Breztri , two puffs twice daily, and an albuterol  rescue inhaler as needed. She also uses a nebulizer with albuterol  at least twice a day. Previous treatments included Daliresp , which was discontinued due to gastrointestinal side effects, and azithromycin , which was stopped due to intolerance. She stopped smoking in March 2023, although she has had occasional slips. No smoking while on oxygen . She experiences shortness of breath on exertion, especially outdoors, and always carries her rescue inhaler.  She has a history of a lung nodule that was previously evaluated with a bronchoscopy, which did not reveal cancer. Regular scans are conducted to monitor for changes. A recent CT scan in October showed a new 7 mm nodule in the left upper lobe of her lung.  In November, she was hospitalized for diverticulitis, during which her breathing was monitored but remained stable. Her breathing has since returned to baseline.  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 She has not had a pulmonary function test since 2019.     Past Medical History:  Diagnosis Date   Anxiety    Atherosclerosis    Colon polyp, hyperplastic    COPD (chronic obstructive pulmonary disease) (HCC)    Diverticulosis    Gallstone    Pneumonia      Family History  Problem Relation Age of Onset   Arthritis Mother    COPD Mother    Emphysema Mother    Stroke Father    Heart disease Father    Cancer Father        MELANOMA    Breast cancer Maternal Grandmother    Colon cancer Neg Hx      Social History   Socioeconomic History   Marital status: Divorced    Spouse name: Not on file   Number of children: Not on file   Years of education: Not on file   Highest education level: 12th grade  Occupational History   Not on file  Tobacco Use   Smoking status: Former    Current packs/day: 0.00    Types: Cigarettes    Quit date: 12/15/2021    Years since quitting: 2.8    Passive exposure: Past   Smokeless tobacco: Never   Tobacco comments:    Pt states she quit smoking in March 2023. 07/21/2022 Tay  Substance and Sexual Activity   Alcohol use: No    Alcohol/week: 0.0 standard drinks of alcohol   Drug use: No   Sexual activity: Never    Comment: 1ST INTERCOURSE- 16, PARTNERS- 4  Other Topics Concern   Not on file  Social History Narrative   Divorced   Social Drivers of Health   Tobacco Use: Medium Risk (10/14/2024)   Patient History    Smoking Tobacco Use: Former    Smokeless Tobacco Use: Never    Passive Exposure: Past  Physicist, Medical Strain: Medium Risk (04/09/2024)   Overall Financial Resource Strain (CARDIA)    Difficulty of Paying Living Expenses: Somewhat hard  Food Insecurity: No Food Insecurity (09/18/2024)  Epic    Worried About Programme Researcher, Broadcasting/film/video in the Last Year: Never true    [employer] of Food in the Last Year: Never true  Recent Concern: Food Insecurity - Food Insecurity Present (09/02/2024)   Epic    Worried About Programme Researcher, Broadcasting/film/video in the Last Year: Never true    [employer] of Food in the Last Year: Sometimes true  Transportation Needs: No Transportation Needs (09/18/2024)   Epic    Lack of Transportation (Medical): No    Lack of Transportation (Non-Medical): No  Physical Activity: Insufficiently Active (04/09/2024)   Exercise Vital Sign    Days of Exercise per Week: 7 days    Minutes of Exercise per Session: 10 min  Stress: Stress Concern Present (04/09/2024)   [employer]  of Occupational Health - Occupational Stress Questionnaire    Feeling of Stress: Very much  Social Connections: Socially Isolated (09/02/2024)   Social Connection and Isolation Panel    Frequency of Communication with Friends and Family: More than three times a week    Frequency of Social Gatherings with Friends and Family: Once a week    Attends Religious Services: Never    Database Administrator or Organizations: No    Attends Banker Meetings: Never    Marital Status: Divorced  Catering Manager Violence: Not At Risk (09/18/2024)   Epic    Fear of Current or Ex-Partner: No    Emotionally Abused: No    Physically Abused: No    Sexually Abused: No  Depression (PHQ2-9): Low Risk (09/20/2024)   Depression (PHQ2-9)    PHQ-2 Score: 1  Alcohol Screen: Low Risk (02/20/2024)   Alcohol Screen    Last Alcohol Screening Score (AUDIT): 0  Housing: Unknown (09/18/2024)   Epic    Unable to Pay for Housing in the Last Year: No    Number of Times Moved in the Last Year: Not on file    Homeless in the Last Year: No  Utilities: Not At Risk (09/18/2024)   Epic    Threatened with loss of utilities: No  Health Literacy: Adequate Health Literacy (02/20/2024)   B1300 Health Literacy    Frequency of need for help with medical instructions: Never     Allergies[1]   Outpatient Medications Prior to Visit  Medication Sig Dispense Refill   albuterol  (PROVENTIL ) (2.5 MG/3ML) 0.083% nebulizer solution Take 3 mLs (2.5 mg total) by nebulization every 4 (four) hours as needed for wheezing or shortness of breath. 75 mL 1   ALPRAZolam  (XANAX ) 0.25 MG tablet TAKE 1 TABLET(0.25 MG) BY MOUTH TWICE DAILY AS NEEDED 60 tablet 0   budesonide -glycopyrrolate -formoterol  (BREZTRI  AEROSPHERE) 160-9-4.8 MCG/ACT AERO inhaler Inhale 2 puffs into the lungs in the morning and at bedtime. 3 each 3   cholecalciferol (VITAMIN D3) 25 MCG (1000 UNIT) tablet Take 1,000 Units by mouth daily.     cyanocobalamin  2000 MCG tablet  Take 1 tablet (2,000 mcg total) by mouth daily. 90 tablet 0   ibuprofen  (ADVIL ) 400 MG tablet Take 1 tablet (400 mg total) by mouth every 6 (six) hours as needed for mild pain (pain score 1-3) or moderate pain (pain score 4-6). 20 tablet 0   OXYGEN  Inhale 3 L/min into the lungs continuous.     pantoprazole  (PROTONIX ) 40 MG tablet Take 1 tablet (40 mg total) by mouth daily. 30 tablet 0   polycarbophil (FIBERCON) 625 MG tablet Take 1 tablet (625 mg total) by mouth 2 (two)  times daily. 60 tablet 0   VENTOLIN  HFA 108 (90 Base) MCG/ACT inhaler INHALE 1 TO 2 PUFFS INTO THE LUNGS EVERY 6 HOURS AS NEEDED FOR WHEEZING OR SHORTNESS OF BREATH 18 g 3   ferrous sulfate  325 (65 FE) MG EC tablet Take 1 tablet (325 mg total) by mouth daily with breakfast. (Patient not taking: Reported on 10/14/2024) 60 tablet 0   furosemide  (LASIX ) 20 MG tablet Take 1 tablet (20 mg total) by mouth daily. (Patient not taking: Reported on 10/14/2024) 30 tablet 0   gabapentin  (NEURONTIN ) 100 MG capsule Take 2 capsules (200 mg total) by mouth at bedtime. (Patient not taking: Reported on 10/14/2024) 30 capsule 0   rosuvastatin  (CRESTOR ) 10 MG tablet Take 1 tablet (10 mg total) by mouth daily. (Patient not taking: Reported on 10/14/2024) 90 tablet 1   No facility-administered medications prior to visit.    ROS Reviewed all systems and reported negative except as above     Objective:   Vitals:   10/14/24 1102  BP: 117/78  Pulse: 98  Temp: 97.7 F (36.5 C)  TempSrc: Oral  SpO2: 97%  Weight: 120 lb (54.4 kg)  Height: 5' 6 (1.676 m)    Physical Exam Physical Exam GENERAL: Appropriate to age, no acute distress. HEAD EYES EARS NOSE THROAT: Moist mucous membranes, atraumatic, normocephalic. CHEST: Small wheeze heard on auscultation. CARDIAC: Regular rate and rhythm, normal S1, normal S2, no murmurs, no rubs, no gallops. ABDOMEN: Soft, nontender. NEUROLOGICAL: Motor and sensation grossly intact, alert and oriented times X  3. EXTREMITIES: Warm, well perfused, no edema.     CBC    Component Value Date/Time   WBC 10.9 (H) 09/15/2024 0411   RBC 3.30 (L) 09/15/2024 0411   HGB 9.5 (L) 09/15/2024 0411   HCT 30.1 (L) 09/15/2024 0411   PLT 530 (H) 09/15/2024 0411   MCV 91.2 09/15/2024 0411   MCH 28.8 09/15/2024 0411   MCHC 31.6 09/15/2024 0411   RDW 13.9 09/15/2024 0411   LYMPHSABS 2.6 09/12/2024 0433   MONOABS 0.7 09/12/2024 0433   EOSABS 0.3 09/12/2024 0433   BASOSABS 0.1 09/12/2024 0433     Results Radiology Chest CT (07/2024): New 7 mm nodule in the left upper lobe; stable pre-existing nodule  I reviewed her PFTs from 2020 which shows an FEV1 of 51% predicted, FVC of 74% predicted, FEV1 FVC ratio 52% suggestive of an obstructive process.  Her total lung capacity is 130% predicted.  Her DLCO is 73% predicted.  PFTs suggestive of obstructive process likely COPD.   PFT:    Latest Ref Rng & Units 10/24/2018   11:36 AM  PFT Results  FVC-Pre L 2.22   FVC-Predicted Pre % 74   FVC-Post L 2.15   FVC-Predicted Post % 72   Pre FEV1/FVC % % 52   Post FEV1/FCV % % 50   FEV1-Pre L 1.17   FEV1-Predicted Pre % 51   FEV1-Post L 1.08   DLCO uncorrected ml/min/mmHg 15.91   DLCO UNC% % 73   DLVA Predicted % 83   TLC L 6.19   TLC % Predicted % 130   RV % Predicted % 190          Assessment & Plan:   Assessment and Plan Assessment & Plan Chronic obstructive pulmonary disease with asthma COPD with asthma managed with Breztri  and albuterol . Previous intolerance to Daliresp  and azithromycin . Stable breathing post-diverticulitis hospitalization. Discussed pulmonary rehab and nebulized Daliresp  as potential options. No adverse side effects  reported. - Continue Breztri  and albuterol . - Consider pulmonary rehab post-retirement. - Consider [facility]  if interested and covered by insurance.  She would like to hold off at this time - Order repeat pulmonary function tests to assess COPD stage. - Advised to  contact if experiencing lung tightness or breathing difficulties.  Pulmonary nodule, left upper lobe New 7 mm nodule in left upper lobe identified on CT. Previous nodule unchanged. Discussed potential need for biopsy if changes occur. - Schedule CT scan around January 20th or early February. - Monitor nodule for growth; consider biopsy if changes are noted.        Zola Herter, MD [facility] Office: [phone]        [1]  Allergies Allergen Reactions   Codeine Nausea And Vomiting   Erythromycin Nausea And Vomiting   Ampicillin Nausea And Vomiting, Rash and Other (See Comments)    Pt states that rash was on her tongue.  Has patient had a PCN reaction causing immediate rash, facial/tongue/throat swelling, SOB or lightheadedness with hypotension yes Has patient had a PCN reaction causing severe rash involving mucus membranes or skin necrosis: no Has patient had a PCN reaction that required hospitalization yes Has patient had a PCN reaction occurring within the last 10 years: no If all of the above answers are NO, then may proceed with Cephalosporin use.    "

## 2024-10-15 ENCOUNTER — Telehealth: Payer: Self-pay | Admitting: *Deleted

## 2024-10-15 ENCOUNTER — Other Ambulatory Visit: Payer: Self-pay | Admitting: Internal Medicine

## 2024-10-15 DIAGNOSIS — F411 Generalized anxiety disorder: Secondary | ICD-10-CM

## 2024-10-15 NOTE — Telephone Encounter (Signed)
 Team,  This pt has severe COPD and uses home 02; her procedure has to be done at the hospital.  Thanks,  Norleen EMERSON Schillings

## 2024-10-21 ENCOUNTER — Ambulatory Visit: Admitting: Internal Medicine

## 2024-10-21 ENCOUNTER — Ambulatory Visit

## 2024-10-21 ENCOUNTER — Other Ambulatory Visit: Payer: Self-pay | Admitting: Internal Medicine

## 2024-10-21 VITALS — Ht 61.5 in | Wt 117.6 lb

## 2024-10-21 DIAGNOSIS — K5732 Diverticulitis of large intestine without perforation or abscess without bleeding: Secondary | ICD-10-CM

## 2024-10-21 DIAGNOSIS — F411 Generalized anxiety disorder: Secondary | ICD-10-CM

## 2024-10-21 MED ORDER — NA SULFATE-K SULFATE-MG SULF 17.5-3.13-1.6 GM/177ML PO SOLN: 1.0000 | Freq: Once | ORAL | 0 refills | Status: AC

## 2024-10-21 NOTE — Progress Notes (Signed)
 PCP MD at time of PV: Debby Molt, MD  __________________________________________________________________________________________________________________________________________  No egg allergy known to patient  No soy allergy known to patient No issues known to pt with past sedation with any surgeries or procedures Patient denies ever being told they had issues or difficulty with intubation  No FH of Malignant Hyperthermia Pt is not on diet pills Pt on home 02  Pt is not on blood thinners  No A fib or A flutter Have any cardiac testing pending-- no  LOA: independent  No Chew or Snuff tobacco __________________________________________________________________________________________________________________________________________  Constipation: no  Prep: suprep  __________________________________________________________________________________________________________________________________________  PV completed with patient. Prep instructions reviewed and provided during apt. Rx sent to preferred pharmacy.

## 2024-10-21 NOTE — Telephone Encounter (Signed)
 Copied from CRM [id number]. Topic: Clinical - Medication Refill >> Oct 21, 2024  2:12 PM Berneda FALCON wrote: Medication: solution ALPRAZolam  (XANAX ) 0.25 MG tablet  Has the patient contacted their pharmacy? Yes (Agent: If no, request that the patient contact the pharmacy for the refill. If patient does not wish to contact the pharmacy document the reason why and proceed with request.) (Agent: If yes, when and what did the pharmacy advise?)  This is the patient's preferred pharmacy:  [id number] [address], [place] - [address] AT [facility] [address] [place] [address] Phone: [phone] Fax: [phone]  Is this the correct pharmacy for this prescription? Yes If no, delete pharmacy and type the correct one.   Has the prescription been filled recently? No  Is the patient out of the medication? No  Has the patient been seen for an appointment in the last year OR does the patient have an upcoming appointment? Yes  Can we respond through MyChart? Yes  Agent: Please be advised that Rx refills may take up to 3 business days. We ask that you follow-up with your pharmacy.

## 2024-10-24 MED ORDER — ALPRAZOLAM 0.25 MG PO TABS: 0.2500 mg | ORAL_TABLET | Freq: Two times a day (BID) | ORAL | 2 refills | Status: AC | PRN

## 2024-10-28 ENCOUNTER — Telehealth: Admitting: Emergency Medicine

## 2024-10-28 DIAGNOSIS — M25579 Pain in unspecified ankle and joints of unspecified foot: Secondary | ICD-10-CM

## 2024-10-28 NOTE — Progress Notes (Signed)
" °  Because you do not have a known problem with out, I cannot ensure I am giving you the right treatment without you having your ankle examined. I feel your condition warrants further evaluation and I recommend that you be seen in a face-to-face visit.   NOTE: There will be NO CHARGE for this E-Visit   If you are having a true medical emergency, please call 911.     For an urgent face to face visit, [facility] has multiple urgent care centers for your convenience.  Click the link below for the full list of locations and hours, walk-in wait times, appointment scheduling options and driving directions:  Urgent Care - [place], [place], [place], [place], [place], [place]  [place]     Your MyChart E-visit questionnaire answers were reviewed by a board certified advanced clinical practitioner to complete your personal care plan based on your specific symptoms.    Thank you for using e-Visits.    "

## 2024-10-29 ENCOUNTER — Encounter (HOSPITAL_COMMUNITY): Payer: Self-pay | Admitting: Gastroenterology

## 2024-11-01 ENCOUNTER — Ambulatory Visit: Payer: Self-pay | Admitting: Surgery

## 2024-11-01 DIAGNOSIS — R739 Hyperglycemia, unspecified: Secondary | ICD-10-CM

## 2024-11-01 NOTE — Progress Notes (Signed)
 Surgery orders requested via Epic inbox.

## 2024-11-03 ENCOUNTER — Other Ambulatory Visit: Payer: Self-pay | Admitting: Internal Medicine

## 2024-11-03 DIAGNOSIS — J441 Chronic obstructive pulmonary disease with (acute) exacerbation: Secondary | ICD-10-CM

## 2024-11-04 ENCOUNTER — Encounter: Payer: Self-pay | Admitting: Gastroenterology

## 2024-11-04 ENCOUNTER — Telehealth: Payer: Self-pay | Admitting: Gastroenterology

## 2024-11-04 NOTE — Telephone Encounter (Addendum)
 Procedure:Colonoscopy Procedure date: 11/11/24 Procedure location: [facility] Arrival Time: 6:30 am Spoke with the patient Y/N: Yes Any prep concerns? No Has the patient obtained the prep from the pharmacy ? Yes Do you have a care partner and transportation: Yes Any additional concerns? No

## 2024-11-07 ENCOUNTER — Ambulatory Visit (HOSPITAL_BASED_OUTPATIENT_CLINIC_OR_DEPARTMENT_OTHER)
Admission: RE | Admit: 2024-11-07 | Discharge: 2024-11-07 | Disposition: A | Discharge status: Home / Self Care | Source: Ambulatory Visit | Attending: Acute Care | Admitting: Acute Care

## 2024-11-07 DIAGNOSIS — R911 Solitary pulmonary nodule: Secondary | ICD-10-CM | POA: Insufficient documentation

## 2024-11-07 NOTE — Progress Notes (Addendum)
 Date of COVID positive in last 38 days:No  PCP - Debby Molt, MD Cardiologist - N/A Pulmonologist - Zola Herter, MD  Chest x-ray - 04-16-24 Epic EKG - 04-16-24 Epic Stress Test - 08-19-20 Epic ECHO - N/A Cardiac Cath - N/A Pacemaker/ICD device last checked:N/A Spinal Cord Stimulator:N/A  Bowel Prep - Yes, patient has antibiotics and instructions  Sleep Study - N/A CPAP -   Prediabetes Fasting Blood Sugar - does not check  Checks Blood Sugar _____ times a day  Last dose of GLP1 agonist-  N/A GLP1 instructions:  Do not take after     Last dose of SGLT-2 inhibitors-  N/A SGLT-2 instructions:  Do not take after    Blood Thinner Instructions: N/A Last dose:   Time: Aspirin  Instructions:N/A Last Dose:  Activity level:  Patient steps that she does not climb stairs, just has two to get into her house.  Able to perform activities of daily living without chest pain.  Patient is on 3 L of continuous oxygen .  States that shortness of breath has not worsened.  Able to sit about an hour without oxygen .    Anesthesia review: Oxygen  3 L, COPD  Patient denies shortness of breath, fever, cough and chest pain at PAT appointment  Patient verbalized understanding of instructions that were given to them at the PAT appointment. Patient was also instructed that they will need to review over the PAT instructions again at home before surgery.

## 2024-11-07 NOTE — Patient Instructions (Addendum)
 SURGICAL WAITING ROOM VISITATION Patients having surgery or a procedure may have no more than 2 support people in the waiting area - these visitors may rotate.    Children under the age of 62 will not be allowed to visit due to the increase in respiratory illness  Children under the age of 53 must have an adult with them who is not the patient.  If the patient needs to stay at the hospital during part of their recovery, the visitor guidelines for inpatient rooms apply. Pre-op nurse will coordinate an appropriate time for 1 support person to accompany patient in pre-op.  This support person may not rotate.    Please refer to the [facility] website for the visitor guidelines for Inpatients (after your surgery is over and you are in a regular room).     Your procedure is scheduled on: 11-20-24   Report to [facility] Main Entrance    Report to admitting at 10:00 AM   Call this number if you have problems the morning of surgery [phone]    Follow a clear liquid diet the day of prep   Do not eat food :After Midnight.   After Midnight you may have the following liquids until 9:15 AM DAY OF SURGERY  Water Non-Citrus Juices (without pulp, NO RED-Apple, White grape, White cranberry) Black Coffee (NO MILK/CREAM OR CREAMERS, sugar ok)  Clear Tea (NO MILK/CREAM OR CREAMERS, sugar ok) regular and decaf                             Plain Jell-O (NO RED)                                           Fruit ices (not with fruit pulp, NO RED)                                     Popsicles (NO RED)                                                               Sports drinks like Gatorade (NO RED)  Drink 2 Pre-surgery G2 the evening before surgery                   The day of surgery:  Drink ONE (1) Pre-Surgery  G2 by 9:15 AM the morning of surgery. Drink in one sitting. Do not sip.  This drink was given to you during your hospital  pre-op appointment visit. Nothing else to drink after  completing the Pre-Surgery  G2.          If you have questions, please contact your surgeons office.   FOLLOW BOWEL PREP AND ANY ADDITIONAL PRE OP INSTRUCTIONS YOU RECEIVED FROM YOUR SURGEON'S OFFICE!!!     Oral Hygiene is also important to reduce your risk of infection.  Remember - BRUSH YOUR TEETH THE MORNING OF SURGERY WITH YOUR REGULAR TOOTHPASTE   Do NOT smoke after Midnight   Take these medicines the morning of surgery with A SIP OF WATER:    Okay to use inhalers   Alprazolam  (Xanax ) if needed    Stop all vitamins and herbal supplements 7 days before surgery                              You may not have any metal on your body including hair pins, jewelry, and body piercing             Do not wear make-up, lotions, powders, perfumes, or deodorant  Do not wear nail polish including gel and S&S, artificial/acrylic nails, or any other type of covering on natural nails including finger and toenails. If you have artificial nails, gel coating, etc. that needs to be removed by a nail salon please have this removed prior to surgery or surgery may need to be canceled/ delayed if the surgeon/ anesthesia feels like they are unable to be safely monitored.   Do not shave  48 hours prior to surgery.     Do not bring valuables to the hospital. [place] IS NOT RESPONSIBLE   FOR VALUABLES.   Contacts, dentures or bridgework may not be worn into surgery.   Bring small overnight bag day of surgery.   DO NOT BRING YOUR HOME MEDICATIONS TO THE HOSPITAL. PHARMACY WILL DISPENSE MEDICATIONS LISTED ON YOUR MEDICATION LIST TO YOU DURING YOUR ADMISSION IN THE HOSPITAL!                Please read over the following fact sheets you were given: IF YOU HAVE QUESTIONS ABOUT YOUR PRE-OP INSTRUCTIONS PLEASE CALL [phone] [place] or [phone]  If you received a COVID test during your pre-op visit  it is requested that you wear a mask when out in public, stay away  from anyone that may not be feeling well and notify your surgeon if you develop symptoms. If you test positive for Covid or have been in contact with anyone that has tested positive in the last 10 days please notify you surgeon.  [facility] - Preparing for Surgery Before surgery, you can play an important role.  Because skin is not sterile, your skin needs to be as free of germs as possible.  You can reduce the number of germs on your skin by washing with CHG (chlorahexidine gluconate) soap before surgery.  CHG is an antiseptic cleaner which kills germs and bonds with the skin to continue killing germs even after washing. Please DO NOT use if you have an allergy to CHG or antibacterial soaps.  If your skin becomes reddened/irritated stop using the CHG and inform your nurse when you arrive at Short Stay. Do not shave (including legs and underarms) for at least 48 hours prior to the first CHG shower.  You may shave your face/neck.  Please follow these instructions carefully:  1.  Shower with CHG Soap the night before surgery ONLY (DO NOT USE THE SOAP THE MORNING OF SURGERY).  2.  If you choose to wash your hair, wash your hair first as usual with your normal  shampoo.  3.  After you shampoo, rinse your hair and body thoroughly to remove the shampoo.  4.  Use CHG as you would any other liquid soap.  You can apply chg directly to the skin and wash.  Gently with a scrungie or clean washcloth.  5.  Apply the CHG Soap to your body ONLY FROM THE NECK DOWN.   Do   not use on face/ open                           Wound or open sores. Avoid contact with eyes, ears mouth and   genitals (private parts).                       Wash face,  Genitals (private parts) with your normal soap.             6.  Wash thoroughly, paying special attention to the area where your    surgery  will be performed.  7.  Thoroughly rinse your body with warm water from the neck down.  8.  DO NOT shower/wash  with your normal soap after using and rinsing off the CHG Soap.                9.  Pat yourself dry with a clean towel.            10.  Wear clean pajamas.            11.  Place clean sheets on your bed the night of your first shower and do not  sleep with pets. Day of Surgery :  Shower with regular soap Do not apply any CHG, lotions/deodorants the morning of surgery.  Please wear clean clothes to the hospital/surgery center.  FAILURE TO FOLLOW THESE INSTRUCTIONS MAY RESULT IN THE CANCELLATION OF YOUR SURGERY  PATIENT SIGNATURE_________________________________  NURSE SIGNATURE__________________________________  ________________________________________________________________________ WHAT IS A BLOOD TRANSFUSION? Blood Transfusion Information  A transfusion is the replacement of blood or some of its parts. Blood is made up of multiple cells which provide different functions. Red blood cells carry oxygen  and are used for blood loss replacement. White blood cells fight against infection. Platelets control bleeding. Plasma helps clot blood. Other blood products are available for specialized needs, such as hemophilia or other clotting disorders. BEFORE THE TRANSFUSION  Who gives blood for transfusions?  Healthy volunteers who are fully evaluated to make sure their blood is safe. This is blood bank blood. Transfusion therapy is the safest it has ever been in the practice of medicine. Before blood is taken from a donor, a complete history is taken to make sure that person has no history of diseases nor engages in risky social behavior (examples are intravenous drug use or sexual activity with multiple partners). The donor's travel history is screened to minimize risk of transmitting infections, such as malaria. The donated blood is tested for signs of infectious diseases, such as HIV and hepatitis. The blood is then tested to be sure it is compatible with you in order to minimize the chance of a  transfusion reaction. If you or a relative donates blood, this is often done in anticipation of surgery and is not appropriate for emergency situations. It takes many days to process the donated blood. RISKS AND COMPLICATIONS Although transfusion therapy is very safe and saves many lives, the main dangers of transfusion include:  Getting an infectious disease. Developing a transfusion reaction. This is an allergic reaction to something in the blood you were given. Every precaution is taken to  prevent this. The decision to have a blood transfusion has been considered carefully by your caregiver before blood is given. Blood is not given unless the benefits outweigh the risks. AFTER THE TRANSFUSION Right after receiving a blood transfusion, you will usually feel much better and more energetic. This is especially true if your red blood cells have gotten low (anemic). The transfusion raises the level of the red blood cells which carry oxygen , and this usually causes an energy increase. The nurse administering the transfusion will monitor you carefully for complications. HOME CARE INSTRUCTIONS  No special instructions are needed after a transfusion. You may find your energy is better. Speak with your caregiver about any limitations on activity for underlying diseases you may have. SEEK MEDICAL CARE IF:  Your condition is not improving after your transfusion. You develop redness or irritation at the intravenous (IV) site. SEEK IMMEDIATE MEDICAL CARE IF:  Any of the following symptoms occur over the next 12 hours: Shaking chills. You have a temperature by mouth above 102 F (38.9 C), not controlled by medicine. Chest, back, or muscle pain. People around you feel you are not acting correctly or are confused. Shortness of breath or difficulty breathing. Dizziness and fainting. You get a rash or develop hives. You have a decrease in urine output. Your urine turns a dark color or changes to pink, red,  or brown. Any of the following symptoms occur over the next 10 days: You have a temperature by mouth above 102 F (38.9 C), not controlled by medicine. Shortness of breath. Weakness after normal activity. The white part of the eye turns yellow (jaundice). You have a decrease in the amount of urine or are urinating less often. Your urine turns a dark color or changes to pink, red, or brown. Document Released: 09/30/2000 Document Revised: 12/26/2011 Document Reviewed: 05/19/2008 Exit[employer] Patient Information 2014 [place], [place].  _______________________________________________________________________   Incentive Spirometer  An incentive spirometer is a tool that can help keep your lungs clear and active. This tool measures how well you are filling your lungs with each breath. Taking long deep breaths may help reverse or decrease the chance of developing breathing (pulmonary) problems (especially infection) following: A long period of time when you are unable to move or be active. BEFORE THE PROCEDURE  If the spirometer includes an indicator to show your best effort, your nurse or respiratory therapist will set it to a desired goal. If possible, sit up straight or lean slightly forward. Try not to slouch. Hold the incentive spirometer in an upright position. INSTRUCTIONS FOR USE  Sit on the edge of your bed if possible, or sit up as far as you can in bed or on a chair. Hold the incentive spirometer in an upright position. Breathe out normally. Place the mouthpiece in your mouth and seal your lips tightly around it. Breathe in slowly and as deeply as possible, raising the piston or the ball toward the top of the column. Hold your breath for 3-5 seconds or for as long as possible. Allow the piston or ball to fall to the bottom of the column. Remove the mouthpiece from your mouth and breathe out normally. Rest for a few seconds and repeat Steps 1 through 7 at least 10 times every 1-2 hours  when you are awake. Take your time and take a few normal breaths between deep breaths. The spirometer may include an indicator to show your best effort. Use the indicator as a goal to work toward during each  repetition. After each set of 10 deep breaths, practice coughing to be sure your lungs are clear. If you have an incision (the cut made at the time of surgery), support your incision when coughing by placing a pillow or rolled up towels firmly against it. Once you are able to get out of bed, walk around indoors and cough well. You may stop using the incentive spirometer when instructed by your caregiver.  RISKS AND COMPLICATIONS Take your time so you do not get dizzy or light-headed. If you are in pain, you may need to take or ask for pain medication before doing incentive spirometry. It is harder to take a deep breath if you are having pain. AFTER USE Rest and breathe slowly and easily. It can be helpful to keep track of a log of your progress. Your caregiver can provide you with a simple table to help with this. If you are using the spirometer at home, follow these instructions: SEEK MEDICAL CARE IF:  You are having difficultly using the spirometer. You have trouble using the spirometer as often as instructed. Your pain medication is not giving enough relief while using the spirometer. You develop fever of 100.5 F (38.1 C) or higher. SEEK IMMEDIATE MEDICAL CARE IF:  You cough up bloody sputum that had not been present before. You develop fever of 102 F (38.9 C) or greater. You develop worsening pain at or near the incision site. MAKE SURE YOU:  Understand these instructions. Will watch your condition. Will get help right away if you are not doing well or get worse. Document Released: 02/13/2007 Document Revised: 12/26/2011 Document Reviewed: 04/16/2007 [facility] Patient Information 2014 [place], [place].   ________________________________________________________________________

## 2024-11-08 ENCOUNTER — Other Ambulatory Visit: Payer: Self-pay

## 2024-11-08 ENCOUNTER — Encounter (HOSPITAL_COMMUNITY)
Admission: RE | Admit: 2024-11-08 | Discharge: 2024-11-08 | Disposition: A | Source: Ambulatory Visit | Attending: Surgery

## 2024-11-08 ENCOUNTER — Encounter (HOSPITAL_COMMUNITY): Payer: Self-pay

## 2024-11-08 VITALS — BP 137/87 | HR 102 | Temp 98.3°F | Resp 21 | Ht 61.5 in | Wt 113.6 lb

## 2024-11-08 DIAGNOSIS — J984 Other disorders of lung: Secondary | ICD-10-CM | POA: Diagnosis not present

## 2024-11-08 DIAGNOSIS — R739 Hyperglycemia, unspecified: Secondary | ICD-10-CM | POA: Insufficient documentation

## 2024-11-08 DIAGNOSIS — Z01812 Encounter for preprocedural laboratory examination: Secondary | ICD-10-CM | POA: Insufficient documentation

## 2024-11-08 DIAGNOSIS — Z01818 Encounter for other preprocedural examination: Secondary | ICD-10-CM

## 2024-11-08 HISTORY — DX: Family history of other specified conditions: Z84.89

## 2024-11-08 HISTORY — DX: Vesicointestinal fistula: N32.1

## 2024-11-08 LAB — CBC
HCT: 37.6 % (ref 36.0–46.0)
Hemoglobin: 11.9 g/dL — ABNORMAL LOW (ref 12.0–15.0)
MCH: 28.3 pg (ref 26.0–34.0)
MCHC: 31.6 g/dL (ref 30.0–36.0)
MCV: 89.5 fL (ref 80.0–100.0)
Platelets: 422 K/uL — ABNORMAL HIGH (ref 150–400)
RBC: 4.2 MIL/uL (ref 3.87–5.11)
RDW: 14.3 % (ref 11.5–15.5)
WBC: 7.6 K/uL (ref 4.0–10.5)
nRBC: 0 % (ref 0.0–0.2)

## 2024-11-08 LAB — BASIC METABOLIC PANEL WITH GFR
Anion gap: 9 (ref 5–15)
BUN: 10 mg/dL (ref 8–23)
CO2: 32 mmol/L (ref 22–32)
Calcium: 9.7 mg/dL (ref 8.9–10.3)
Chloride: 96 mmol/L — ABNORMAL LOW (ref 98–111)
Creatinine, Ser: 0.48 mg/dL (ref 0.44–1.00)
GFR, Estimated: >60 mL/min
Glucose, Bld: 89 mg/dL (ref 70–99)
Potassium: 4 mmol/L (ref 3.5–5.1)
Sodium: 137 mmol/L (ref 135–145)

## 2024-11-08 LAB — HEMOGLOBIN A1C
Hgb A1c MFr Bld: 5.5 % (ref 4.8–5.6)
Mean Plasma Glucose: 111.15 mg/dL

## 2024-11-11 ENCOUNTER — Ambulatory Visit (HOSPITAL_COMMUNITY): Admission: RE | Admit: 2024-11-11 | Source: Home / Self Care | Admitting: Gastroenterology

## 2024-11-11 ENCOUNTER — Encounter (HOSPITAL_COMMUNITY): Admission: RE | Payer: Self-pay | Source: Home / Self Care

## 2024-11-12 ENCOUNTER — Encounter (HOSPITAL_COMMUNITY): Payer: Self-pay

## 2024-11-12 ENCOUNTER — Other Ambulatory Visit: Payer: Self-pay

## 2024-11-12 ENCOUNTER — Telehealth: Payer: Self-pay | Admitting: Gastroenterology

## 2024-11-12 DIAGNOSIS — Z122 Encounter for screening for malignant neoplasm of respiratory organs: Secondary | ICD-10-CM

## 2024-11-12 DIAGNOSIS — Z87891 Personal history of nicotine dependence: Secondary | ICD-10-CM

## 2024-11-12 NOTE — Progress Notes (Signed)
 " Case: [id number] Date/Time: 11/20/24 1208   Procedures:      COLECTOMY, SIGMOID, ROBOT-ASSISTED - ROBOTIC RESECTION OF SIGMID COLON WITH POSSIBLE BLADDER REPAIR WITH POSSIBLE OSTOMY RIGID PROCTOSCOPY     CREATION, COLOSTOMY, DIVERTING, LAPAROSCOPIC     SIGMOIDOSCOPY, FLEXIBLE     CYSTOSCOPY WITH INDOCYANINE GREEN IMAGING (ICG)   Anesthesia type: General   Pre-op diagnosis: COLOVESICAL FISTULA   Location: [facility] ROOM 02 / [facility]   Surgeons: [name], MD; [name], MD       DISCUSSION: Lynn Kline is a 69 yo female with PMH of former smoking, COPD with chronic respiratory failure, chronic DOE, anxiety, colovesical fistula 2/2 diverticulitis.  Patient follows with Pulmonology for COPD with chronic respiratory failure on 3L 24/7. Uses inhalers and has a nebulizer. Her breathing noted to be stable. Repeat PFTs ordered. New pulmonary nodule in LUL is being monitored with serial CTs.   Patient admitted from 11/17-12/1/25 for abdominal pain and found to have diverticulitis with a colovesical fistula. Treated conservatively with antibiotics. Now scheduled for surgery above.  Colonoscopy arranged to be done prior to surgery. Currently scheduled for 2/2  VS: BP 137/87   Pulse (!) 102   Temp 36.8 C (Oral)   Resp (!) 21   Ht 5' 1.5 (1.562 m)   Wt 51.5 kg   SpO2 98%   BMI 21.12 kg/m   PROVIDERS: Joshua Debby CROME, MD   LABS: Labs reviewed: Acceptable for surgery. (all labs ordered are listed, but only abnormal results are displayed)  Labs Reviewed  CBC - Abnormal; Notable for the following components:      Result Value   Hemoglobin 11.9 (*)    Platelets 422 (*)    All other components within normal limits  BASIC METABOLIC PANEL WITH GFR - Abnormal; Notable for the following components:   Chloride 96 (*)    All other components within normal limits  HEMOGLOBIN A1C  TYPE AND SCREEN     CT Chest 11/07/24:  IMPRESSION: Lung-RADS 2, benign appearance or behavior.  Continue annual screening with low-dose chest CT without contrast in 12 months.   Aortic Atherosclerosis (ICD10-I70.0) and Emphysema (ICD10-J43.9).   Stress test 08/19/2020: Nuclear stress EF: 67%. No wall motion abnormalities noted. There was no ST segment deviation noted during stress. Defect 1: There is a small defect of mild severity present in the apical inferior location. This is a low risk study. No significant ischemia identified. Mild RCA calcified plaque seen on lung cancer screening CT. Aortic atherosclerosis present as well.   Past Medical History:  Diagnosis Date   Anxiety    Atherosclerosis    Colon polyp, hyperplastic    Colovesical fistula    COPD (chronic obstructive pulmonary disease) (HCC)    Diverticulosis    Family history of adverse reaction to anesthesia    Son - aggressive   Gallstone    Pneumonia     Past Surgical History:  Procedure Laterality Date   ABDOMINAL HYSTERECTOMY     BRONCHIAL BIOPSY  11/03/2020   Procedure: BRONCHIAL BIOPSIES;  Surgeon: Brenna Adine CROME, DO;  Location: [facility];  Service: Pulmonary;;   BRONCHIAL BRUSHINGS  11/03/2020   Procedure: BRONCHIAL BRUSHINGS;  Surgeon: Brenna Adine CROME, DO;  Location: [facility];  Service: Pulmonary;;   BRONCHIAL NEEDLE ASPIRATION BIOPSY  11/03/2020   Procedure: BRONCHIAL NEEDLE ASPIRATION BIOPSIES;  Surgeon: Brenna Adine CROME, DO;  Location: [facility];  Service: Pulmonary;;   BRONCHIAL WASHINGS  11/03/2020  Procedure: BRONCHIAL WASHINGS;  Surgeon: Brenna Adine CROME, DO;  Location: [facility];  Service: Pulmonary;;   CESAREAN SECTION     CHOLECYSTECTOMY     COLONOSCOPY     FIDUCIAL MARKER PLACEMENT  11/03/2020   Procedure: FIDUCIAL MARKER PLACEMENT;  Surgeon: Brenna Adine CROME, DO;  Location: [facility];  Service: Pulmonary;;   LUNG BIOPSY     VIDEO BRONCHOSCOPY WITH ENDOBRONCHIAL NAVIGATION N/A 11/03/2020   Procedure: VIDEO BRONCHOSCOPY WITH ENDOBRONCHIAL NAVIGATION AND POSSIBLE  ULTRASOUND;  Surgeon: Brenna Adine CROME, DO;  Location: [facility];  Service: Pulmonary;  Laterality: N/A;   VIDEO BRONCHOSCOPY WITH ENDOBRONCHIAL ULTRASOUND  11/03/2020   Procedure: VIDEO BRONCHOSCOPY WITH ENDOBRONCHIAL ULTRASOUND;  Surgeon: Brenna Adine CROME, DO;  Location: [facility];  Service: Pulmonary;;    MEDICATIONS:  albuterol  (PROVENTIL ) (2.5 MG/3ML) 0.083% nebulizer solution   ALPRAZolam  (XANAX ) 0.25 MG tablet   budesonide -glycopyrrolate -formoterol  (BREZTRI  AEROSPHERE) 160-9-4.8 MCG/ACT AERO inhaler   cholecalciferol  (VITAMIN D3) 25 MCG (1000 UNIT) tablet   nitrofurantoin (MACRODANTIN) 100 MG capsule   OXYGEN    polycarbophil (FIBERCON) 625 MG tablet   VENTOLIN  HFA 108 (90 Base) MCG/ACT inhaler   No current facility-administered medications for this encounter.   Burnard CHRISTELLA Odis DEVONNA [facility] Short Stay/Anesthesiology [facility] Phone [phone] 11/15/2024 11:49 AM       "

## 2024-11-12 NOTE — Telephone Encounter (Signed)
 PT is calling to have a nurse discuss available dates for her procedure at the hospital. Please advise.

## 2024-11-13 NOTE — Telephone Encounter (Signed)
 See 09/04/24 telephone note; patient with history of complicated diverticulitis who was originally scheduled for colonoscopy to exclude mass lesion in hospital setting with Dr Stacia (has supplemental o2 dependent COPD) on 11/11/24. Unfortunately, patient did not have procedure completed on 1/26 due to inclement weather.  Patient is currently scheduled for sigmoid colectomy with Dr Sheldon on 11/20/24. [facility] calls to inquire about getting patient rescheduled for hospital procedure before 2/4 surgery.  Currently, possibility of colonoscopy with Dr Legrand at hospital on 1/29 at 1030 am, Dr Suzann on 2/2 at 1030 am or Dr Albertus on 2/3 at 945 or 1115 am.  Would any of you be agreeable to completing this case so patient can move forward with surgery as scheduled 2/4?

## 2024-11-13 NOTE — Telephone Encounter (Signed)
 I can do it tomorrow morning (Jan 29th) if patient can be reached in next couple of hours, prep arranged and patient is on clear liquids the remainder of today.  - H. Danis

## 2024-11-13 NOTE — Telephone Encounter (Signed)
 I have spoken to patient to offer appointment for 11/14/24 or 11/18/24. Patient states she will come on 11/18/24 colonoscopy with Dr Suzann at 1030 am, 900 am arrival. She already has prep instructions available as well as unused prep kit. She verbalizes understanding of date/time/provider change.  Thank you, Dr Suzann for your help!

## 2024-11-14 ENCOUNTER — Encounter (HOSPITAL_COMMUNITY): Payer: Self-pay | Admitting: Pediatrics

## 2024-11-15 NOTE — Anesthesia Preprocedure Evaluation (Signed)
 "                                  Anesthesia Evaluation  Patient identified by MRN, date of birth, ID band Patient awake    Reviewed: Allergy & Precautions, NPO status , Patient's Chart, lab work & pertinent test results  History of Anesthesia Complications (+) Family history of anesthesia reaction and history of anesthetic complications (son with aggression)  Airway Mallampati: III  TM Distance: >3 FB Neck ROM: Full   Comment: Previous grade I view with Miller 2, easy mask Dental  (+) Upper Dentures, Dental Advisory Given, Missing 6 teeth on the bottom, front left one is loose:   Pulmonary neg shortness of breath, asthma , neg sleep apnea, COPD (3L O2),  COPD inhaler and oxygen  dependent, neg recent URI, Patient abstained from smoking., former smoker Lung nodule   Pulmonary exam normal breath sounds clear to auscultation       Cardiovascular (-) hypertension(-) angina + CAD  (-) Past MI, (-) Cardiac Stents and (-) CABG (-) dysrhythmias  Rhythm:Regular Rate:Normal  HLD  Low-risk stress test 08/19/2020   Neuro/Psych neg Seizures PSYCHIATRIC DISORDERS Anxiety        GI/Hepatic Neg liver ROS,neg GERD  ,,Colovesical fistula, diverticulosis   Endo/Other  negative endocrine ROS    Renal/GU negative Renal ROS     Musculoskeletal   Abdominal   Peds  Hematology  (+) Blood dyscrasia, anemia Lab Results      Component                Value               Date                      WBC                      7.6                 11/08/2024                HGB                      11.9 (L)            11/08/2024                HCT                      37.6                11/08/2024                MCV                      89.5                11/08/2024                PLT                      422 (H)             11/08/2024              Anesthesia Other Findings   Reproductive/Obstetrics  Anesthesia  Physical Anesthesia Plan  ASA: 3  Anesthesia Plan: General   Post-op Pain Management: Tylenol  PO (pre-op)*   Induction: Intravenous  PONV Risk Score and Plan: 3 and Ondansetron , Dexamethasone  and Treatment may vary due to age or medical condition  Airway Management Planned: Oral ETT  Additional Equipment:   Intra-op Plan:   Post-operative Plan: Extubation in OR  Informed Consent: I have reviewed the patients History and Physical, chart, labs and discussed the procedure including the risks, benefits and alternatives for the proposed anesthesia with the patient or authorized representative who has indicated his/her understanding and acceptance.     Dental advisory given  Plan Discussed with: CRNA and Anesthesiologist  Anesthesia Plan Comments: (See PAT note from 1/23  Risks of general anesthesia discussed including, but not limited to, sore throat, hoarse voice, chipped/damaged teeth, injury to vocal cords, nausea and vomiting, allergic reactions, lung infection, heart attack, stroke, and death. Also discussed possibility of post-op increased oxygen  requirement or intubation given her baseline pulmonary co-morbidities. All questions answered. )         Anesthesia Quick Evaluation  "

## 2024-11-18 ENCOUNTER — Other Ambulatory Visit: Payer: Self-pay

## 2024-11-18 ENCOUNTER — Ambulatory Visit (HOSPITAL_COMMUNITY)
Admission: RE | Admit: 2024-11-18 | Discharge: 2024-11-18 | Disposition: A | Attending: Pediatrics | Admitting: Pediatrics

## 2024-11-18 ENCOUNTER — Ambulatory Visit (HOSPITAL_COMMUNITY): Admitting: Certified Registered Nurse Anesthetist

## 2024-11-18 ENCOUNTER — Encounter (HOSPITAL_COMMUNITY): Payer: Self-pay | Admitting: Pediatrics

## 2024-11-18 ENCOUNTER — Encounter (HOSPITAL_COMMUNITY)
Admission: RE | Disposition: A | Discharge status: Home / Self Care | Payer: Self-pay | Source: Home / Self Care | Attending: Pediatrics

## 2024-11-18 DIAGNOSIS — K6289 Other specified diseases of anus and rectum: Secondary | ICD-10-CM

## 2024-11-18 DIAGNOSIS — K5730 Diverticulosis of large intestine without perforation or abscess without bleeding: Secondary | ICD-10-CM | POA: Diagnosis not present

## 2024-11-18 DIAGNOSIS — K56699 Other intestinal obstruction unspecified as to partial versus complete obstruction: Secondary | ICD-10-CM

## 2024-11-18 DIAGNOSIS — D128 Benign neoplasm of rectum: Secondary | ICD-10-CM | POA: Diagnosis not present

## 2024-11-18 DIAGNOSIS — K621 Rectal polyp: Secondary | ICD-10-CM | POA: Diagnosis not present

## 2024-11-18 DIAGNOSIS — Z800 Family history of malignant neoplasm of digestive organs: Secondary | ICD-10-CM | POA: Insufficient documentation

## 2024-11-18 DIAGNOSIS — Z9981 Dependence on supplemental oxygen: Secondary | ICD-10-CM | POA: Insufficient documentation

## 2024-11-18 DIAGNOSIS — Z83719 Family history of colon polyps, unspecified: Secondary | ICD-10-CM | POA: Insufficient documentation

## 2024-11-18 DIAGNOSIS — Z87891 Personal history of nicotine dependence: Secondary | ICD-10-CM | POA: Diagnosis not present

## 2024-11-18 DIAGNOSIS — K648 Other hemorrhoids: Secondary | ICD-10-CM | POA: Diagnosis not present

## 2024-11-18 DIAGNOSIS — J4489 Other specified chronic obstructive pulmonary disease: Secondary | ICD-10-CM | POA: Insufficient documentation

## 2024-11-18 DIAGNOSIS — I2510 Atherosclerotic heart disease of native coronary artery without angina pectoris: Secondary | ICD-10-CM

## 2024-11-18 DIAGNOSIS — I739 Peripheral vascular disease, unspecified: Secondary | ICD-10-CM | POA: Insufficient documentation

## 2024-11-18 DIAGNOSIS — K5732 Diverticulitis of large intestine without perforation or abscess without bleeding: Secondary | ICD-10-CM

## 2024-11-18 DIAGNOSIS — J449 Chronic obstructive pulmonary disease, unspecified: Secondary | ICD-10-CM

## 2024-11-18 DIAGNOSIS — K6389 Other specified diseases of intestine: Secondary | ICD-10-CM | POA: Diagnosis not present

## 2024-11-18 MED ORDER — PROPOFOL 10 MG/ML IV BOLUS
INTRAVENOUS | Status: DC | PRN
  Administered 2024-11-18: 20 mg via INTRAVENOUS

## 2024-11-18 MED ORDER — LIDOCAINE 2% (20 MG/ML) 5 ML SYRINGE
INTRAMUSCULAR | Status: DC | PRN
  Administered 2024-11-18: 40 mg via INTRAVENOUS

## 2024-11-18 MED ORDER — PROPOFOL 500 MG/50ML IV EMUL
INTRAVENOUS | Status: DC | PRN
  Administered 2024-11-18: 125 ug/kg/min via INTRAVENOUS

## 2024-11-18 MED ORDER — PROPOFOL 1000 MG/100ML IV EMUL
INTRAVENOUS | Status: AC
  Filled 2024-11-18: qty 100

## 2024-11-18 MED ORDER — ONDANSETRON HCL 4 MG/2ML IJ SOLN
INTRAMUSCULAR | Status: DC | PRN
  Administered 2024-11-18: 4 mg via INTRAVENOUS

## 2024-11-18 MED ORDER — SODIUM CHLORIDE 0.9 % IV SOLN: INTRAVENOUS | Status: DC

## 2024-11-18 NOTE — Discharge Instructions (Signed)

## 2024-11-18 NOTE — Anesthesia Procedure Notes (Signed)
 Procedure Name: MAC Date/Time: 11/18/2024 9:30 AM  Performed by: Zulema Leita PARAS, CRNAPre-anesthesia Checklist: Patient identified, Emergency Drugs available, Suction available and Patient being monitored Oxygen  Delivery Method: Simple face mask

## 2024-11-18 NOTE — Anesthesia Preprocedure Evaluation (Addendum)
"                                    Anesthesia Evaluation  Patient identified by MRN, date of birth, ID band Patient awake    Reviewed: Allergy & Precautions, NPO status , Patient's Chart, lab work & pertinent test results  Airway Mallampati: I  TM Distance: >3 FB Neck ROM: Full    Dental  (+) Edentulous Upper, Loose, Missing, Dental Advisory Given,    Pulmonary asthma , COPD (3L O2, on inhalers and nebulizers),  COPD inhaler and oxygen  dependent, Patient abstained from smoking., former smoker   Pulmonary exam normal breath sounds clear to auscultation       Cardiovascular + CAD and + Peripheral Vascular Disease  Normal cardiovascular exam Rhythm:Regular Rate:Normal     Neuro/Psych  PSYCHIATRIC DISORDERS Anxiety     negative neurological ROS     GI/Hepatic negative GI ROS, Neg liver ROS,,,  Endo/Other  negative endocrine ROS    Renal/GU negative Renal ROS  negative genitourinary   Musculoskeletal negative musculoskeletal ROS (+)    Abdominal   Peds  Hematology negative hematology ROS (+)   Anesthesia Other Findings   Reproductive/Obstetrics                              Anesthesia Physical Anesthesia Plan  ASA: 3  Anesthesia Plan: MAC   Post-op Pain Management:    Induction: Intravenous  PONV Risk Score and Plan: Propofol  infusion and Treatment may vary due to age or medical condition  Airway Management Planned: Natural Airway  Additional Equipment:   Intra-op Plan:   Post-operative Plan:   Informed Consent: I have reviewed the patients History and Physical, chart, labs and discussed the procedure including the risks, benefits and alternatives for the proposed anesthesia with the patient or authorized representative who has indicated his/her understanding and acceptance.     Dental advisory given  Plan Discussed with: CRNA  Anesthesia Plan Comments:          Anesthesia Quick Evaluation  "

## 2024-11-18 NOTE — Transfer of Care (Signed)
 Immediate Anesthesia Transfer of Care Note  Patient: Lynn Kline  Procedure(s) Performed: COLONOSCOPY BIOPSY, GI TRACT POLYPECTOMY, INTESTINE  Patient Location: PACU and Endoscopy Unit  Anesthesia Type:MAC  Level of Consciousness: drowsy  Airway & Oxygen  Therapy: Spontaneous breathing, [place] as per pre-op  Post-op Assessment: Report given to RN and Post -op Vital signs reviewed and stable  Post vital signs: Reviewed and stable  Last Vitals:  Vitals Value Taken Time  BP 172/87 11/18/24 10:20  Temp 36.6 C 11/18/24 10:18  Pulse 112 11/18/24 10:21  Resp 20 11/18/24 10:21  SpO2 98 % 11/18/24 10:21  Vitals shown include unfiled device data.  Last Pain:  Vitals:   11/18/24 1018  TempSrc: Oral  PainSc: Asleep         Complications: No notable events documented.

## 2024-11-18 NOTE — Anesthesia Postprocedure Evaluation (Signed)
"   Anesthesia Post Note  Patient: Lynn Kline  Procedure(s) Performed: COLONOSCOPY BIOPSY, GI TRACT POLYPECTOMY, INTESTINE     Patient location during evaluation: Endoscopy Anesthesia Type: MAC Level of consciousness: awake and alert Pain management: pain level controlled Vital Signs Assessment: post-procedure vital signs reviewed and stable Respiratory status: spontaneous breathing, nonlabored ventilation, respiratory function stable and patient connected to nasal cannula oxygen  Cardiovascular status: blood pressure returned to baseline and stable Postop Assessment: no apparent nausea or vomiting Anesthetic complications: no   No notable events documented.  Last Vitals:  Vitals:   11/18/24 1030 11/18/24 1040  BP: (!) 158/85 (!) 158/90  Pulse: (!) 113 (!) 102  Resp: 19 18  Temp:    SpO2: 99% 100%    Last Pain:  Vitals:   11/18/24 1040  TempSrc:   PainSc: 0-No pain                 Ennis Heavner L Justan Gaede      "

## 2024-11-19 ENCOUNTER — Encounter (HOSPITAL_COMMUNITY): Payer: Self-pay | Admitting: Pediatrics

## 2024-11-19 LAB — SURGICAL PATHOLOGY

## 2024-11-20 ENCOUNTER — Inpatient Hospital Stay (HOSPITAL_COMMUNITY)

## 2024-11-20 ENCOUNTER — Encounter (HOSPITAL_COMMUNITY): Payer: Self-pay | Admitting: Anesthesiology

## 2024-11-20 ENCOUNTER — Inpatient Hospital Stay (HOSPITAL_COMMUNITY): Admission: RE | Admit: 2024-11-20 | Source: Ambulatory Visit | Admitting: Surgery

## 2024-11-20 ENCOUNTER — Ambulatory Visit: Payer: Self-pay | Admitting: Pediatrics

## 2024-11-20 ENCOUNTER — Other Ambulatory Visit: Payer: Self-pay

## 2024-11-20 ENCOUNTER — Inpatient Hospital Stay (HOSPITAL_COMMUNITY): Admitting: Anesthesiology

## 2024-11-20 ENCOUNTER — Inpatient Hospital Stay (HOSPITAL_COMMUNITY): Payer: Self-pay | Admitting: Medical

## 2024-11-20 ENCOUNTER — Encounter (HOSPITAL_COMMUNITY): Admission: RE | Payer: Self-pay | Source: Ambulatory Visit

## 2024-11-20 ENCOUNTER — Encounter (HOSPITAL_COMMUNITY): Payer: Self-pay | Admitting: Surgery

## 2024-11-20 DIAGNOSIS — J9612 Chronic respiratory failure with hypercapnia: Secondary | ICD-10-CM

## 2024-11-20 DIAGNOSIS — G9341 Metabolic encephalopathy: Secondary | ICD-10-CM | POA: Diagnosis not present

## 2024-11-20 DIAGNOSIS — R7303 Prediabetes: Secondary | ICD-10-CM

## 2024-11-20 DIAGNOSIS — J9622 Acute and chronic respiratory failure with hypercapnia: Secondary | ICD-10-CM | POA: Diagnosis not present

## 2024-11-20 DIAGNOSIS — J9621 Acute and chronic respiratory failure with hypoxia: Secondary | ICD-10-CM | POA: Diagnosis not present

## 2024-11-20 DIAGNOSIS — Z720 Tobacco use: Secondary | ICD-10-CM

## 2024-11-20 DIAGNOSIS — R579 Shock, unspecified: Secondary | ICD-10-CM

## 2024-11-20 DIAGNOSIS — J449 Chronic obstructive pulmonary disease, unspecified: Secondary | ICD-10-CM

## 2024-11-20 DIAGNOSIS — N321 Vesicointestinal fistula: Principal | ICD-10-CM | POA: Diagnosis present

## 2024-11-20 LAB — BLOOD GAS, ARTERIAL
Acid-Base Excess: 3.2 mmol/L — ABNORMAL HIGH (ref 0.0–2.0)
Acid-base deficit: 0.5 mmol/L (ref 0.0–2.0)
Bicarbonate: 33.2 mmol/L — ABNORMAL HIGH (ref 20.0–28.0)
Bicarbonate: 28.5 mmol/L — ABNORMAL HIGH (ref 20.0–28.0)
Drawn by: 56037
MECHVT: 400 mL
O2 Content: 3 L/min
O2 Content: 100 L/min
O2 Saturation: 99.7 %
O2 Saturation: 98.6 %
PEEP: 5 cmH2O
Patient temperature: 36.3
Patient temperature: 36.3
RATE: 18 {breaths}/min
pCO2 arterial: 84 mmHg (ref 32–48)
pCO2 arterial: 71 mmHg (ref 32–48)
pH, Arterial: 7.2 — ABNORMAL LOW (ref 7.35–7.45)
pH, Arterial: 7.21 — ABNORMAL LOW (ref 7.35–7.45)
pO2, Arterial: 111 mmHg — ABNORMAL HIGH (ref 83–108)
pO2, Arterial: 83 mmHg (ref 83–108)

## 2024-11-20 LAB — MRSA NEXT GEN BY PCR, NASAL: MRSA by PCR Next Gen: NOT DETECTED

## 2024-11-20 LAB — ABO/RH: ABO/RH(D): O NEG

## 2024-11-20 LAB — GLUCOSE, CAPILLARY
Glucose-Capillary: 118 mg/dL — ABNORMAL HIGH (ref 70–99)
Glucose-Capillary: 131 mg/dL — ABNORMAL HIGH (ref 70–99)

## 2024-11-20 LAB — HEMOGLOBIN AND HEMATOCRIT, BLOOD
HCT: 29.4 % — ABNORMAL LOW (ref 36.0–46.0)
Hemoglobin: 8.9 g/dL — ABNORMAL LOW (ref 12.0–15.0)

## 2024-11-20 MED ORDER — MAGIC MOUTHWASH: 15.0000 mL | Freq: Four times a day (QID) | ORAL | Status: AC | PRN

## 2024-11-20 MED ORDER — PROCHLORPERAZINE EDISYLATE 10 MG/2ML IJ SOLN: 5.0000 mg | Freq: Four times a day (QID) | INTRAMUSCULAR | Status: DC | PRN

## 2024-11-20 MED ORDER — AMISULPRIDE (ANTIEMETIC) 5 MG/2ML IV SOLN: 10.0000 mg | Freq: Once | INTRAVENOUS | Status: DC | PRN

## 2024-11-20 MED ORDER — DIPHENHYDRAMINE HCL 12.5 MG/5ML PO ELIX: 12.5000 mg | ORAL_SOLUTION | Freq: Four times a day (QID) | ORAL | Status: DC | PRN

## 2024-11-20 MED ORDER — ALBUTEROL SULFATE (2.5 MG/3ML) 0.083% IN NEBU: 2.5000 mg | INHALATION_SOLUTION | RESPIRATORY_TRACT | Status: DC | PRN

## 2024-11-20 MED ORDER — LIDOCAINE 2% (20 MG/ML) 5 ML SYRINGE
INTRAMUSCULAR | Status: DC | PRN
  Administered 2024-11-20: 60 mg via INTRAVENOUS

## 2024-11-20 MED ORDER — NALOXONE HCL 0.4 MG/ML IJ SOLN
INTRAMUSCULAR | Status: DC | PRN
  Administered 2024-11-20: 80 ug via INTRAVENOUS
  Administered 2024-11-20: 40 ug via INTRAVENOUS
  Administered 2024-11-20: 120 ug via INTRAVENOUS

## 2024-11-20 MED ORDER — BUPIVACAINE-EPINEPHRINE (PF) 0.25% -1:200000 IJ SOLN
INTRAMUSCULAR | Status: AC
  Filled 2024-11-20: qty 30

## 2024-11-20 MED ORDER — DEXAMETHASONE SOD PHOSPHATE PF 10 MG/ML IJ SOLN
INTRAMUSCULAR | Status: DC | PRN
  Administered 2024-11-20: 10 mg via INTRAVENOUS

## 2024-11-20 MED ORDER — ONDANSETRON HCL 4 MG PO TABS: 4.0000 mg | ORAL_TABLET | Freq: Four times a day (QID) | ORAL | Status: DC | PRN

## 2024-11-20 MED ORDER — METHYLENE BLUE 20 MG/2ML IV SOSY
PREFILLED_SYRINGE | INTRAVENOUS | Status: AC
  Filled 2024-11-20: qty 2

## 2024-11-20 MED ORDER — ACETAMINOPHEN 10 MG/ML IV SOLN
1000.0000 mg | Freq: Once | INTRAVENOUS | Status: AC
  Administered 2024-11-20: 1000 mg via INTRAVENOUS

## 2024-11-20 MED ORDER — LACTATED RINGERS IV BOLUS
1000.0000 mL | Freq: Three times a day (TID) | INTRAVENOUS | Status: AC | PRN
  Administered 2024-11-21: 1000 mL via INTRAVENOUS

## 2024-11-20 MED ORDER — SENNA 8.6 MG PO TABS
1.0000 | ORAL_TABLET | Freq: Two times a day (BID) | ORAL | Status: DC
  Administered 2024-11-20 – 2024-11-21 (×2): 8.6 mg
  Filled 2024-11-20 (×2): qty 1

## 2024-11-20 MED ORDER — DEXAMETHASONE SOD PHOSPHATE PF 10 MG/ML IJ SOLN
INTRAMUSCULAR | Status: AC
  Filled 2024-11-20: qty 1

## 2024-11-20 MED ORDER — PHENYLEPHRINE HCL (PRESSORS) 10 MG/ML IV SOLN
INTRAVENOUS | Status: DC | PRN
  Administered 2024-11-20: 160 ug via INTRAVENOUS
  Administered 2024-11-20: 240 ug via INTRAVENOUS
  Administered 2024-11-20: 80 ug via INTRAVENOUS
  Administered 2024-11-20: 160 ug via INTRAVENOUS

## 2024-11-20 MED ORDER — DIPHENHYDRAMINE HCL 50 MG/ML IJ SOLN: 12.5000 mg | Freq: Four times a day (QID) | INTRAMUSCULAR | Status: DC | PRN

## 2024-11-20 MED ORDER — PROPOFOL 10 MG/ML IV BOLUS
INTRAVENOUS | Status: DC | PRN
  Administered 2024-11-20: 80 mg via INTRAVENOUS

## 2024-11-20 MED ORDER — CHLORHEXIDINE GLUCONATE 0.12 % MT SOLN
15.0000 mL | Freq: Once | OROMUCOSAL | Status: AC
  Administered 2024-11-20: 15 mL via OROMUCOSAL

## 2024-11-20 MED ORDER — TRAMADOL HCL 50 MG PO TABS: 50.0000 mg | ORAL_TABLET | Freq: Four times a day (QID) | ORAL | Status: DC | PRN

## 2024-11-20 MED ORDER — LACTATED RINGERS IV BOLUS
1000.0000 mL | Freq: Once | INTRAVENOUS | Status: AC
  Administered 2024-11-20: 1000 mL via INTRAVENOUS

## 2024-11-20 MED ORDER — SUCCINYLCHOLINE CHLORIDE 200 MG/10ML IV SOSY
PREFILLED_SYRINGE | INTRAVENOUS | Status: AC
  Filled 2024-11-20: qty 10

## 2024-11-20 MED ORDER — BUPIVACAINE LIPOSOME 1.3 % IJ SUSP: 20.0000 mL | Freq: Once | INTRAMUSCULAR | Status: DC

## 2024-11-20 MED ORDER — SALINE SPRAY 0.65 % NA SOLN: 1.0000 | Freq: Four times a day (QID) | NASAL | Status: AC | PRN

## 2024-11-20 MED ORDER — STERILE WATER FOR INJECTION IJ SOLN
INTRAMUSCULAR | Status: AC
  Filled 2024-11-20: qty 10

## 2024-11-20 MED ORDER — CALCIUM POLYCARBOPHIL 625 MG PO TABS: 625.0000 mg | ORAL_TABLET | Freq: Two times a day (BID) | ORAL | Status: DC

## 2024-11-20 MED ORDER — MENTHOL 3 MG MT LOZG: 1.0000 | LOZENGE | OROMUCOSAL | Status: AC | PRN

## 2024-11-20 MED ORDER — ORAL CARE MOUTH RINSE: 15.0000 mL | OROMUCOSAL | Status: DC | PRN

## 2024-11-20 MED ORDER — PROPOFOL 10 MG/ML IV BOLUS
INTRAVENOUS | Status: DC | PRN
  Administered 2024-11-20: 30 mg via INTRAVENOUS
  Administered 2024-11-20: 120 mg via INTRAVENOUS

## 2024-11-20 MED ORDER — POLYETHYLENE GLYCOL 3350 17 G PO PACK
17.0000 g | PACK | Freq: Every day | ORAL | Status: DC
  Administered 2024-11-21: 17 g
  Filled 2024-11-20: qty 1

## 2024-11-20 MED ORDER — NOREPINEPHRINE 4 MG/250ML-% IV SOLN
INTRAVENOUS | Status: AC
  Administered 2024-11-20: 5 ug/min via INTRAVENOUS
  Filled 2024-11-20: qty 250

## 2024-11-20 MED ORDER — ACETAMINOPHEN 10 MG/ML IV SOLN
INTRAVENOUS | Status: AC
  Filled 2024-11-20: qty 100

## 2024-11-20 MED ORDER — ORAL CARE MOUTH RINSE
15.0000 mL | OROMUCOSAL | Status: DC
  Administered 2024-11-20 – 2024-11-21 (×9): 15 mL via OROMUCOSAL

## 2024-11-20 MED ORDER — TRAMADOL HCL 50 MG PO TABS: 50.0000 mg | ORAL_TABLET | Freq: Four times a day (QID) | ORAL | 0 refills | Status: AC | PRN

## 2024-11-20 MED ORDER — METHOCARBAMOL 500 MG PO TABS: 1000.0000 mg | ORAL_TABLET | Freq: Four times a day (QID) | ORAL | Status: DC | PRN

## 2024-11-20 MED ORDER — FENTANYL CITRATE (PF) 250 MCG/5ML IJ SOLN
INTRAMUSCULAR | Status: DC | PRN
  Administered 2024-11-20 (×2): 50 ug via INTRAVENOUS

## 2024-11-20 MED ORDER — FENTANYL CITRATE (PF) 50 MCG/ML IJ SOSY: 50.0000 ug | PREFILLED_SYRINGE | INTRAMUSCULAR | Status: DC | PRN

## 2024-11-20 MED ORDER — LACTATED RINGERS IV SOLN: INTRAVENOUS | Status: DC | PRN

## 2024-11-20 MED ORDER — NOREPINEPHRINE 4 MG/250ML-% IV SOLN
0.0000 ug/min | INTRAVENOUS | Status: DC
  Administered 2024-11-21: 10 ug/min via INTRAVENOUS
  Administered 2024-11-22: 4 ug/min via INTRAVENOUS
  Filled 2024-11-20 (×2): qty 250

## 2024-11-20 MED ORDER — BISMUTH SUBSALICYLATE 262 MG/15ML PO SUSP: 30.0000 mL | Freq: Three times a day (TID) | ORAL | Status: DC | PRN

## 2024-11-20 MED ORDER — ACETAMINOPHEN 500 MG PO TABS: 1000.0000 mg | ORAL_TABLET | Freq: Once | ORAL | Status: DC

## 2024-11-20 MED ORDER — STERILE WATER FOR IRRIGATION IR SOLN
Status: DC | PRN
  Administered 2024-11-20: 1000 mL

## 2024-11-20 MED ORDER — POLYETHYLENE GLYCOL 3350 17 GM/SCOOP PO POWD: 238.0000 g | Freq: Once | ORAL | Status: DC

## 2024-11-20 MED ORDER — SODIUM CHLORIDE 0.9 % IV SOLN: 250.0000 mL | INTRAVENOUS | Status: AC

## 2024-11-20 MED ORDER — FENTANYL CITRATE (PF) 50 MCG/ML IJ SOSY
PREFILLED_SYRINGE | INTRAMUSCULAR | Status: AC
  Filled 2024-11-20: qty 2

## 2024-11-20 MED ORDER — HYDROMORPHONE HCL 1 MG/ML IJ SOLN
0.5000 mg | INTRAMUSCULAR | Status: AC | PRN
  Administered 2024-11-22: 0.5 mg via INTRAVENOUS
  Filled 2024-11-20: qty 1

## 2024-11-20 MED ORDER — LACTATED RINGERS IR SOLN
Status: DC | PRN
  Administered 2024-11-20 (×2): 1000 mL

## 2024-11-20 MED ORDER — SODIUM CHLORIDE 0.9% FLUSH
3.0000 mL | Freq: Two times a day (BID) | INTRAVENOUS | Status: AC
  Administered 2024-11-20 – 2024-11-22 (×5): 3 mL via INTRAVENOUS

## 2024-11-20 MED ORDER — ROCURONIUM 10MG/ML (10ML) SYRINGE FOR MEDFUSION PUMP - OPTIME
INTRAVENOUS | Status: DC | PRN
  Administered 2024-11-20: 30 mg via INTRAVENOUS
  Administered 2024-11-20: 40 mg via INTRAVENOUS

## 2024-11-20 MED ORDER — SODIUM CHLORIDE 0.9% FLUSH: 3.0000 mL | INTRAVENOUS | Status: AC | PRN

## 2024-11-20 MED ORDER — METHOCARBAMOL 1000 MG/10ML IJ SOLN
1000.0000 mg | Freq: Four times a day (QID) | INTRAMUSCULAR | Status: AC | PRN
  Administered 2024-11-22: 1000 mg via INTRAVENOUS
  Filled 2024-11-20: qty 10

## 2024-11-20 MED ORDER — DEXMEDETOMIDINE HCL IN NACL 80 MCG/20ML IV SOLN
INTRAVENOUS | Status: DC | PRN
  Administered 2024-11-20: 10 ug via INTRAVENOUS

## 2024-11-20 MED ORDER — CHLORHEXIDINE GLUCONATE CLOTH 2 % EX PADS
6.0000 | MEDICATED_PAD | Freq: Every day | CUTANEOUS | Status: DC
  Administered 2024-11-20 – 2024-11-21 (×2): 6 via TOPICAL

## 2024-11-20 MED ORDER — LACTATED RINGERS IV SOLN: INTRAVENOUS | Status: DC

## 2024-11-20 MED ORDER — HYDROMORPHONE HCL 1 MG/ML IJ SOLN
INTRAMUSCULAR | Status: DC | PRN
  Administered 2024-11-20 (×2): 1 mg via INTRAVENOUS

## 2024-11-20 MED ORDER — NEOMYCIN SULFATE 500 MG PO TABS: 1000.0000 mg | ORAL_TABLET | ORAL | Status: DC

## 2024-11-20 MED ORDER — PROPOFOL 1000 MG/100ML IV EMUL
INTRAVENOUS | Status: AC
  Filled 2024-11-20: qty 100

## 2024-11-20 MED ORDER — LACTATED RINGERS IV BOLUS: 500.0000 mL | Freq: Once | INTRAVENOUS | Status: DC

## 2024-11-20 MED ORDER — PROPOFOL 10 MG/ML IV BOLUS
INTRAVENOUS | Status: AC
  Filled 2024-11-20: qty 20

## 2024-11-20 MED ORDER — BISACODYL 5 MG PO TBEC: 20.0000 mg | DELAYED_RELEASE_TABLET | Freq: Once | ORAL | Status: DC

## 2024-11-20 MED ORDER — GABAPENTIN 100 MG PO CAPS
200.0000 mg | ORAL_CAPSULE | ORAL | Status: AC
  Administered 2024-11-20: 200 mg via ORAL
  Filled 2024-11-20: qty 2

## 2024-11-20 MED ORDER — SUGAMMADEX SODIUM 200 MG/2ML IV SOLN
INTRAVENOUS | Status: DC | PRN
  Administered 2024-11-20: 200 mg via INTRAVENOUS

## 2024-11-20 MED ORDER — SODIUM CHLORIDE 0.9 % IV SOLN
1.0000 g | INTRAVENOUS | Status: AC
  Administered 2024-11-20: 1 g via INTRAVENOUS
  Filled 2024-11-20: qty 1000

## 2024-11-20 MED ORDER — ALVIMOPAN 12 MG PO CAPS
12.0000 mg | ORAL_CAPSULE | Freq: Two times a day (BID) | ORAL | Status: DC
  Administered 2024-11-21: 12 mg via ORAL
  Filled 2024-11-20 (×3): qty 1

## 2024-11-20 MED ORDER — ALUM & MAG HYDROXIDE-SIMETH 200-200-20 MG/5ML PO SUSP: 30.0000 mL | Freq: Four times a day (QID) | ORAL | Status: AC | PRN

## 2024-11-20 MED ORDER — ENOXAPARIN SODIUM 40 MG/0.4ML IJ SOSY
40.0000 mg | PREFILLED_SYRINGE | Freq: Once | INTRAMUSCULAR | Status: AC
  Administered 2024-11-20: 40 mg via SUBCUTANEOUS
  Filled 2024-11-20: qty 0.4

## 2024-11-20 MED ORDER — IPRATROPIUM-ALBUTEROL 0.5-2.5 (3) MG/3ML IN SOLN
3.0000 mL | RESPIRATORY_TRACT | Status: AC | PRN
  Administered 2024-11-21 – 2024-11-22 (×2): 3 mL via RESPIRATORY_TRACT
  Filled 2024-11-20 (×2): qty 3

## 2024-11-20 MED ORDER — ONDANSETRON HCL 4 MG/2ML IJ SOLN
INTRAMUSCULAR | Status: AC
  Filled 2024-11-20: qty 2

## 2024-11-20 MED ORDER — GABAPENTIN 100 MG PO CAPS
200.0000 mg | ORAL_CAPSULE | Freq: Every day | ORAL | Status: DC
  Administered 2024-11-20: 200 mg via ORAL
  Filled 2024-11-20: qty 2

## 2024-11-20 MED ORDER — ENSURE SURGERY PO LIQD
237.0000 mL | Freq: Two times a day (BID) | ORAL | Status: AC
  Administered 2024-11-22 (×2): 237 mL via ORAL
  Filled 2024-11-20 (×4): qty 237

## 2024-11-20 MED ORDER — ONDANSETRON HCL 4 MG/2ML IJ SOLN: 4.0000 mg | Freq: Four times a day (QID) | INTRAMUSCULAR | Status: DC | PRN

## 2024-11-20 MED ORDER — ALPRAZOLAM 0.25 MG PO TABS: 0.2500 mg | ORAL_TABLET | Freq: Two times a day (BID) | ORAL | Status: AC | PRN

## 2024-11-20 MED ORDER — MIDAZOLAM HCL 2 MG/2ML IJ SOLN
INTRAMUSCULAR | Status: AC
  Filled 2024-11-20: qty 2

## 2024-11-20 MED ORDER — HYDRALAZINE HCL 20 MG/ML IJ SOLN: 10.0000 mg | INTRAMUSCULAR | Status: DC | PRN

## 2024-11-20 MED ORDER — INSULIN ASPART 100 UNIT/ML IJ SOLN
0.0000 [IU] | INTRAMUSCULAR | Status: AC
  Administered 2024-11-20 – 2024-11-22 (×3): 1 [IU] via SUBCUTANEOUS
  Filled 2024-11-20 (×3): qty 1

## 2024-11-20 MED ORDER — LIDOCAINE HCL (PF) 2 % IJ SOLN
INTRAMUSCULAR | Status: AC
  Filled 2024-11-20: qty 5

## 2024-11-20 MED ORDER — ETOMIDATE 2 MG/ML IV SOLN
INTRAVENOUS | Status: AC
  Filled 2024-11-20: qty 20

## 2024-11-20 MED ORDER — FENTANYL CITRATE (PF) 50 MCG/ML IJ SOSY
PREFILLED_SYRINGE | INTRAMUSCULAR | Status: AC
  Filled 2024-11-20: qty 1

## 2024-11-20 MED ORDER — ACETAMINOPHEN 500 MG PO TABS
1000.0000 mg | ORAL_TABLET | ORAL | Status: AC
  Administered 2024-11-20: 1000 mg via ORAL
  Filled 2024-11-20: qty 2

## 2024-11-20 MED ORDER — VITAMIN D 25 MCG (1000 UNIT) PO TABS: 1000.0000 [IU] | ORAL_TABLET | Freq: Every day | ORAL | Status: DC

## 2024-11-20 MED ORDER — METRONIDAZOLE 500 MG PO TABS: 1000.0000 mg | ORAL_TABLET | ORAL | Status: DC

## 2024-11-20 MED ORDER — FENTANYL BOLUS VIA INFUSION
25.0000 ug | INTRAVENOUS | Status: DC | PRN
  Administered 2024-11-20 (×3): 50 ug via INTRAVENOUS

## 2024-11-20 MED ORDER — FENTANYL CITRATE (PF) 100 MCG/2ML IJ SOLN
INTRAMUSCULAR | Status: AC
  Filled 2024-11-20: qty 2

## 2024-11-20 MED ORDER — METOPROLOL TARTRATE 5 MG/5ML IV SOLN: 5.0000 mg | Freq: Four times a day (QID) | INTRAVENOUS | Status: DC | PRN

## 2024-11-20 MED ORDER — SIMETHICONE 80 MG PO CHEW: 40.0000 mg | CHEWABLE_TABLET | Freq: Four times a day (QID) | ORAL | Status: DC | PRN

## 2024-11-20 MED ORDER — ENSURE PRE-SURGERY PO LIQD: 296.0000 mL | Freq: Once | ORAL | Status: DC

## 2024-11-20 MED ORDER — FENTANYL CITRATE (PF) 50 MCG/ML IJ SOSY
25.0000 ug | PREFILLED_SYRINGE | INTRAMUSCULAR | Status: DC | PRN
  Administered 2024-11-20: 25 ug via INTRAVENOUS

## 2024-11-20 MED ORDER — PHENYLEPHRINE HCL-NACL 20-0.9 MG/250ML-% IV SOLN
INTRAVENOUS | Status: DC | PRN
  Administered 2024-11-20: 50 ug/min via INTRAVENOUS

## 2024-11-20 MED ORDER — FENTANYL 2500MCG IN NS 250ML (10MCG/ML) PREMIX INFUSION
0.0000 ug/h | INTRAVENOUS | Status: DC
  Administered 2024-11-20: 25 ug/h via INTRAVENOUS
  Filled 2024-11-20: qty 250

## 2024-11-20 MED ORDER — PROCHLORPERAZINE MALEATE 10 MG PO TABS: 10.0000 mg | ORAL_TABLET | Freq: Four times a day (QID) | ORAL | Status: DC | PRN

## 2024-11-20 MED ORDER — SUGAMMADEX SODIUM 200 MG/2ML IV SOLN
INTRAVENOUS | Status: AC
  Filled 2024-11-20: qty 2

## 2024-11-20 MED ORDER — BUDESON-GLYCOPYRROL-FORMOTEROL 160-9-4.8 MCG/ACT IN AERO
2.0000 | INHALATION_SPRAY | Freq: Two times a day (BID) | RESPIRATORY_TRACT | Status: AC
  Administered 2024-11-21 – 2024-11-22 (×3): 2 via RESPIRATORY_TRACT
  Filled 2024-11-20: qty 5.9

## 2024-11-20 MED ORDER — NAPHAZOLINE-GLYCERIN 0.012-0.25 % OP SOLN: 1.0000 [drp] | Freq: Four times a day (QID) | OPHTHALMIC | Status: AC | PRN

## 2024-11-20 MED ORDER — ONDANSETRON HCL 4 MG/2ML IJ SOLN
INTRAMUSCULAR | Status: DC | PRN
  Administered 2024-11-20: 4 mg via INTRAVENOUS

## 2024-11-20 MED ORDER — SODIUM CHLORIDE 0.9 % IV SOLN
250.0000 mL | INTRAVENOUS | Status: AC | PRN
  Administered 2024-11-20: 10 mL/h via INTRAVENOUS

## 2024-11-20 MED ORDER — PANTOPRAZOLE SODIUM 40 MG IV SOLR
40.0000 mg | INTRAVENOUS | Status: DC
  Administered 2024-11-20 – 2024-11-22 (×2): 40 mg via INTRAVENOUS
  Filled 2024-11-20 (×2): qty 10

## 2024-11-20 MED ORDER — ROCURONIUM BROMIDE 10 MG/ML (PF) SYRINGE
PREFILLED_SYRINGE | INTRAVENOUS | Status: AC
  Filled 2024-11-20: qty 10

## 2024-11-20 MED ORDER — PHENYLEPHRINE HCL (PRESSORS) 10 MG/ML IV SOLN
INTRAVENOUS | Status: DC | PRN
  Administered 2024-11-20: 80 ug via INTRAVENOUS
  Administered 2024-11-20: 160 ug via INTRAVENOUS
  Administered 2024-11-20: 80 ug via INTRAVENOUS
  Administered 2024-11-20: 160 ug via INTRAVENOUS
  Administered 2024-11-20: 80 ug via INTRAVENOUS

## 2024-11-20 MED ORDER — ALBUTEROL SULFATE HFA 108 (90 BASE) MCG/ACT IN AERS
INHALATION_SPRAY | RESPIRATORY_TRACT | Status: DC | PRN
  Administered 2024-11-20: 2 via RESPIRATORY_TRACT

## 2024-11-20 MED ORDER — ACETAMINOPHEN 500 MG PO TABS
1000.0000 mg | ORAL_TABLET | Freq: Four times a day (QID) | ORAL | Status: DC
  Administered 2024-11-20: 1000 mg via ORAL
  Filled 2024-11-20: qty 2

## 2024-11-20 MED ORDER — 0.9 % SODIUM CHLORIDE (POUR BTL) OPTIME
TOPICAL | Status: DC | PRN
  Administered 2024-11-20 (×2): 1000 mL

## 2024-11-20 MED ORDER — PHENOL 1.4 % MT LIQD: 2.0000 | OROMUCOSAL | Status: AC | PRN

## 2024-11-20 MED ORDER — BUPIVACAINE-EPINEPHRINE (PF) 0.25% -1:200000 IJ SOLN
INTRAMUSCULAR | Status: DC | PRN
  Administered 2024-11-20 (×2): 30 mL via PERINEURAL

## 2024-11-20 MED ORDER — ALVIMOPAN 12 MG PO CAPS
12.0000 mg | ORAL_CAPSULE | ORAL | Status: AC
  Administered 2024-11-20: 12 mg via ORAL
  Filled 2024-11-20: qty 1

## 2024-11-20 MED ORDER — OXYCODONE HCL 5 MG/5ML PO SOLN: 5.0000 mg | Freq: Once | ORAL | Status: DC | PRN

## 2024-11-20 MED ORDER — ENSURE PRE-SURGERY PO LIQD: 592.0000 mL | Freq: Once | ORAL | Status: DC

## 2024-11-20 MED ORDER — ORAL CARE MOUTH RINSE: 15.0000 mL | Freq: Once | OROMUCOSAL | Status: AC

## 2024-11-20 MED ORDER — MELATONIN 3 MG PO TABS: 3.0000 mg | ORAL_TABLET | Freq: Every evening | ORAL | Status: DC | PRN

## 2024-11-20 MED ORDER — SUCCINYLCHOLINE CHLORIDE 200 MG/10ML IV SOSY
PREFILLED_SYRINGE | INTRAVENOUS | Status: DC | PRN
  Administered 2024-11-20: 80 mg via INTRAVENOUS

## 2024-11-20 MED ORDER — ENOXAPARIN SODIUM 40 MG/0.4ML IJ SOSY
40.0000 mg | PREFILLED_SYRINGE | INTRAMUSCULAR | Status: AC
  Administered 2024-11-21 – 2024-11-22 (×2): 40 mg via SUBCUTANEOUS
  Filled 2024-11-20 (×2): qty 0.4

## 2024-11-20 MED ORDER — STERILE WATER FOR INJECTION IJ SOLN
INTRAMUSCULAR | Status: DC | PRN
  Administered 2024-11-20: 20 mL

## 2024-11-20 MED ORDER — OXYCODONE HCL 5 MG PO TABS: 5.0000 mg | ORAL_TABLET | Freq: Once | ORAL | Status: DC | PRN

## 2024-11-20 NOTE — Anesthesia Postprocedure Evaluation (Signed)
"   Anesthesia Post Note  Patient: Lynn Kline  Procedure(s) Performed: COLECTOMY, SIGMOID, ROBOT-ASSISTED CREATION, COLOSTOMY, DIVERTING, LAPAROSCOPIC SIGMOIDOSCOPY, FLEXIBLE CYSTOSCOPY WITH INDOCYANINE GREEN IMAGING (ICG)     Patient location during evaluation: PACU Anesthesia Type: General Level of consciousness: awake Pain management: pain level controlled Vital Signs Assessment: post-procedure vital signs reviewed and stable Respiratory status: spontaneous breathing, nonlabored ventilation and respiratory function stable Cardiovascular status: blood pressure returned to baseline and stable Postop Assessment: no apparent nausea or vomiting Anesthetic complications: yes Comments: Patient required reversal of opioid in the OR for extubation. Patient was extubated and taken to recovery. Please see additional Quick notes in intra-op record. Patient continued to be somnolent but arousable. She was not able to tell her name, birthday or where she was. Given the previous need for opioid reversal and her baseline COPD, an ABG was sent, which resulted with a PaCO2 of 84. She was re-intubated in PACU. Just prior to re-intubation, she could say she was in the hospital but still could not say her name and birthday. Please see separate intubation note for details on intubation. After intubation, I called Harlene Na, DO with Critical Care and gave report via telephone. I then went to the waiting room and spoke with the patient's sister. She called the patient's son, and I also spoke with him via telephone. I walked the patient's sister to the ICU waiting room. I gave report to the ICU nurse and informed her that the patient's sister was in the waiting room and would like to see her sister when they got her settled.   Encounter Notable Events  Notable Event Outcome Phase Comment  Unplanned reintubation  In Recovery     Last Vitals:  Vitals:   11/20/24 2015 11/20/24 2031  BP: 92/60    Pulse: 91 95  Resp: 15 16  Temp:  (!) 36.3 C  SpO2: 100% 100%    Last Pain:  Vitals:   11/20/24 2031  TempSrc: Axillary  PainSc:                  Lynn Kline      "

## 2024-11-20 NOTE — Progress Notes (Signed)
 eLink Physician-Brief Progress Note Patient Name: Lynn Kline DOB: [birth year] MRN: [id number]   Date of Service  11/20/2024  HPI/Events of Note  Patient with post-op hypotension when Propofol  gtt was started.  eICU Interventions  Propofol  discontinued, Fentanyl  gtt given post-op status and likely less severe hemodynamic impact, 1000 ml iv fluid bolus given likelihood of third spacing with colon surgery, stat H & H to exclude intra-peritoneal bleeding. Restraints ordered.        Lynn Kline [name] 11/20/2024, 9:07 PM

## 2024-11-20 NOTE — Anesthesia Procedure Notes (Signed)
 Procedure Name: Intubation Date/Time: 11/20/2024 12:58 PM  Performed by: Nanci Riis, CRNAPre-anesthesia Checklist: Patient identified, Emergency Drugs available, Suction available and Patient being monitored Patient Re-evaluated:Patient Re-evaluated prior to induction Oxygen  Delivery Method: Circle System Utilized Preoxygenation: Pre-oxygenation with 100% oxygen  Induction Type: IV induction Ventilation: Mask ventilation without difficulty Laryngoscope Size: Miller and 3 Grade View: Grade I Tube type: Oral Tube size: 7.0 mm Number of attempts: 1 Airway Equipment and Method: Stylet and Oral airway Placement Confirmation: ETT inserted through vocal cords under direct vision, positive ETCO2 and breath sounds checked- equal and bilateral Secured at: 21 cm Tube secured with: Tape Dental Injury: Teeth and Oropharynx as per pre-operative assessment

## 2024-11-20 NOTE — Interval H&P Note (Signed)
 History and Physical Interval Note:  11/20/2024 11:38 AM  Lynn Kline  has presented today for surgery, with the diagnosis of COLOVESICAL FISTULA.  The various methods of treatment have been discussed with the patient and family. After consideration of risks, benefits and other options for treatment, the patient has consented to  Procedures with comments: COLECTOMY, SIGMOID, ROBOT-ASSISTED (N/A) - ROBOTIC RESECTION OF SIGMID COLON WITH POSSIBLE BLADDER REPAIR WITH POSSIBLE OSTOMY RIGID PROCTOSCOPY CREATION, COLOSTOMY, DIVERTING, LAPAROSCOPIC (N/A) SIGMOIDOSCOPY, FLEXIBLE (N/A) CYSTOSCOPY WITH INDOCYANINE GREEN IMAGING (ICG) (N/A) as a surgical intervention.  The patient's history has been reviewed, patient examined, no change in status, stable for surgery.  I have reviewed the patient's chart and labs.  Questions were answered to the patient's satisfaction.    I have re-reviewed the the patient's records, history, medications, and allergies.  I have re-examined the patient.  I again discussed intraoperative plans and goals of post-operative recovery.  The patient agrees to proceed.  Lynn Kline  06/23/1956 [id number]  Patient Care Team: [name], MD as PCP - General (Internal Medicine) Szabat, [name], [facility] (Inactive) as Pharmacist (Pharmacist) [name], DO as Consulting Physician (Pulmonary Disease) [name], MD as Consulting Physician (Gastroenterology)  Patient Active Problem List   Diagnosis Date Noted   Chronic respiratory failure with hypoxia (HCC) 05/04/2023    Priority: Medium    Diverticulitis of colon 11/18/2024   Stenosis colon (HCC) 11/18/2024   Malnutrition of moderate degree 09/13/2024   COPD, severe (HCC) 09/13/2024   Pre-op evaluation 09/13/2024   Diverticulitis 09/07/2024   Colovesical fistula 09/02/2024   Prediabetes 04/13/2023   Senile purpura 04/13/2023   GAD (generalized anxiety disorder) 04/13/2023   Polyp of colon 10/28/2022    Vitamin D  deficiency 12/22/2021   B12 deficiency 12/22/2021   Lung nodule    Coronary atherosclerosis due to calcified coronary lesion 08/13/2020   Atherosclerosis of aorta 08/13/2020   Gallstones 11/06/2014   Hyperlipidemia with target LDL less than 130 08/19/2014   Dysphagia, pharyngoesophageal phase 08/12/2014   IBS (irritable bowel syndrome) 08/12/2014   COPD with asthma (HCC) 11/15/2011   Tobacco abuse 11/15/2011    Past Medical History:  Diagnosis Date   Anxiety    Atherosclerosis    Colon polyp, hyperplastic    Colovesical fistula    COPD (chronic obstructive pulmonary disease) (HCC)    Diverticulosis    Family history of adverse reaction to anesthesia    Son - aggressive   Gallstone    Pneumonia     Past Surgical History:  Procedure Laterality Date   ABDOMINAL HYSTERECTOMY     BRONCHIAL BIOPSY  11/03/2020   Procedure: BRONCHIAL BIOPSIES;  Surgeon: [name], DO;  Location: [facility];  Service: Pulmonary;;   BRONCHIAL BRUSHINGS  11/03/2020   Procedure: BRONCHIAL BRUSHINGS;  Surgeon: [name], DO;  Location: [facility];  Service: Pulmonary;;   BRONCHIAL NEEDLE ASPIRATION BIOPSY  11/03/2020   Procedure: BRONCHIAL NEEDLE ASPIRATION BIOPSIES;  Surgeon: [name], DO;  Location: [facility];  Service: Pulmonary;;   BRONCHIAL WASHINGS  11/03/2020   Procedure: BRONCHIAL WASHINGS;  Surgeon: [name], DO;  Location: [facility];  Service: Pulmonary;;   CESAREAN SECTION     CHOLECYSTECTOMY     COLONOSCOPY     COLONOSCOPY N/A 11/18/2024   Procedure: COLONOSCOPY;  Surgeon: Suzann Inocente HERO, MD;  Location: [facility];  Service: Gastroenterology;  Laterality: N/A;   FIDUCIAL MARKER PLACEMENT  11/03/2020  Procedure: FIDUCIAL MARKER PLACEMENT;  Surgeon: Brenna Adine CROME, DO;  Location: [facility];  Service: Pulmonary;;   LUNG BIOPSY     VIDEO BRONCHOSCOPY WITH ENDOBRONCHIAL NAVIGATION N/A 11/03/2020   Procedure: VIDEO BRONCHOSCOPY WITH  ENDOBRONCHIAL NAVIGATION AND POSSIBLE ULTRASOUND;  Surgeon: Brenna Adine CROME, DO;  Location: [facility];  Service: Pulmonary;  Laterality: N/A;   VIDEO BRONCHOSCOPY WITH ENDOBRONCHIAL ULTRASOUND  11/03/2020   Procedure: VIDEO BRONCHOSCOPY WITH ENDOBRONCHIAL ULTRASOUND;  Surgeon: Brenna Adine CROME, DO;  Location: [facility];  Service: Pulmonary;;    Social History   Socioeconomic History   Marital status: Divorced    Spouse name: Not on file   Number of children: Not on file   Years of education: Not on file   Highest education level: 12th grade  Occupational History   Not on file  Tobacco Use   Smoking status: Former    Current packs/day: 0.00    Types: Cigarettes    Quit date: 12/15/2021    Years since quitting: 2.9    Passive exposure: Past   Smokeless tobacco: Never   Tobacco comments:    Pt states she quit smoking in March 2023. 07/21/2022 Tay  Vaping Use   Vaping status: Never Used  Substance and Sexual Activity   Alcohol use: No    Alcohol/week: 0.0 standard drinks of alcohol   Drug use: No   Sexual activity: Never    Comment: 1ST INTERCOURSE- 16, PARTNERS- 4  Other Topics Concern   Not on file  Social History Narrative   Divorced   Social Drivers of Health   Tobacco Use: Medium Risk (11/20/2024)   Patient History    Smoking Tobacco Use: Former    Smokeless Tobacco Use: Never    Passive Exposure: Past  Physicist, Medical Strain: Medium Risk (04/09/2024)   Overall Financial Resource Strain (CARDIA)    Difficulty of Paying Living Expenses: Somewhat hard  Food Insecurity: No Food Insecurity (09/18/2024)   Epic    Worried About Programme Researcher, Broadcasting/film/video in the Last Year: Never true    Ran Out of Food in the Last Year: Never true  Recent Concern: Food Insecurity - Food Insecurity Present (09/02/2024)   Epic    Worried About Programme Researcher, Broadcasting/film/video in the Last Year: Never true    Ran Out of Food in the Last Year: Sometimes true  Transportation Needs: No Transportation Needs  (09/18/2024)   Epic    Lack of Transportation (Medical): No    Lack of Transportation (Non-Medical): No  Physical Activity: Insufficiently Active (04/09/2024)   Exercise Vital Sign    Days of Exercise per Week: 7 days    Minutes of Exercise per Session: 10 min  Stress: Stress Concern Present (04/09/2024)   [employer] of Occupational Health - Occupational Stress Questionnaire    Feeling of Stress: Very much  Social Connections: Socially Isolated (09/02/2024)   Social Connection and Isolation Panel    Frequency of Communication with Friends and Family: More than three times a week    Frequency of Social Gatherings with Friends and Family: Once a week    Attends Religious Services: Never    Database Administrator or Organizations: No    Attends Banker Meetings: Never    Marital Status: Divorced  Catering Manager Violence: Not At Risk (09/18/2024)   Epic    Fear of Current or Ex-Partner: No    Emotionally Abused: No    Physically Abused: No    Sexually  Abused: No  Depression (PHQ2-9): Low Risk (09/20/2024)   Depression (PHQ2-9)    PHQ-2 Score: 1  Alcohol Screen: Low Risk (02/20/2024)   Alcohol Screen    Last Alcohol Screening Score (AUDIT): 0  Housing: Unknown (09/18/2024)   Epic    Unable to Pay for Housing in the Last Year: No    Number of Times Moved in the Last Year: Not on file    Homeless in the Last Year: No  Utilities: Not At Risk (09/18/2024)   Epic    Threatened with loss of utilities: No  Health Literacy: Adequate Health Literacy (02/20/2024)   B1300 Health Literacy    Frequency of need for help with medical instructions: Never    Family History  Problem Relation Age of Onset   Arthritis Mother    COPD Mother    Emphysema Mother    Stroke Father    Heart disease Father    Cancer Father        MELANOMA   Breast cancer Maternal Grandmother    Colon cancer Neg Hx    Rectal cancer Neg Hx    Stomach cancer Neg Hx     Medications Prior to  Admission  Medication Sig Dispense Refill Last Dose/Taking   albuterol  (PROVENTIL ) (2.5 MG/3ML) 0.083% nebulizer solution Take 3 mLs (2.5 mg total) by nebulization every 4 (four) hours as needed for wheezing or shortness of breath. 75 mL 1 11/20/2024 at  8:30 AM   ALPRAZolam  (XANAX ) 0.25 MG tablet Take 1 tablet (0.25 mg total) by mouth 2 (two) times daily as needed for anxiety. 60 tablet 2 11/20/2024 at  8:00 AM   budesonide -glycopyrrolate -formoterol  (BREZTRI  AEROSPHERE) 160-9-4.8 MCG/ACT AERO inhaler Inhale 2 puffs into the lungs in the morning and at bedtime. 3 each 3 11/20/2024 at  6:00 AM   cholecalciferol  (VITAMIN D3) 25 MCG (1000 UNIT) tablet Take 1,000 Units by mouth daily.   11/19/2024 at  8:00 AM   nitrofurantoin (MACRODANTIN) 100 MG capsule Take 100 mg by mouth at bedtime.   11/19/2024 at  9:30 PM   OXYGEN  Inhale 3 L/min into the lungs continuous.   Taking   VENTOLIN  HFA 108 (90 Base) MCG/ACT inhaler INHALE 1 TO 2 PUFFS INTO THE LUNGS EVERY 6 HOURS AS NEEDED FOR WHEEZING OR SHORTNESS OF BREATH 18 g 3 11/20/2024 at  6:00 AM   polycarbophil (FIBERCON) 625 MG tablet Take 1 tablet (625 mg total) by mouth 2 (two) times daily. (Patient not taking: Reported on 11/01/2024) 60 tablet 0 More than a month    Current Facility-Administered Medications  Medication Dose Route Frequency Provider Last Rate Last Admin   bupivacaine  liposome (EXPAREL ) 1.3 % injection 266 mg  20 mL Infiltration Once [name], MD       ertapenem  (INVANZ ) 1 g in sodium chloride  0.9 % 100 mL IVPB  1 g Intravenous On Call to OR [name], MD       lactated ringers  infusion   Intravenous Continuous Jefm Garnette LABOR, MD 10 mL/hr at 11/20/24 1050 New Bag at 11/20/24 1050     Allergies[1]  BP (!) 140/92   Pulse 100   Temp 98.4 F (36.9 C) (Oral)   Resp 18   Ht 5' 2 (1.575 m)   Wt 50.8 kg   SpO2 99%   BMI 20.49 kg/m   Labs: Results for orders placed or performed during the hospital encounter of 11/20/24 (from the past 48  hours)  ABO/Rh     Status:  None   Collection Time: 11/20/24 10:45 AM  Result Value Ref Range   ABO/RH(D)      MALVA NEG Performed at [facility], [address], [place], [place] [address]     Imaging / Studies: CT CHEST LCS NODULE F/U LOW DOSE WO CONTRAST Result Date: 11/12/2024 CLINICAL DATA:  History of suspicious lung nodule on screening CT. 51 pack-year history of smoking. EXAM: CT CHEST WITHOUT CONTRAST FOR LUNG CANCER SCREENING NODULE FOLLOW-UP TECHNIQUE: Multidetector CT imaging of the chest was performed following the standard protocol without IV contrast. RADIATION DOSE REDUCTION: This exam was performed according to the departmental dose-optimization program which includes automated exposure control, adjustment of the mA and/or kV according to patient size and/or use of iterative reconstruction technique. COMPARISON:  08/07/2024 FINDINGS: Cardiovascular: The heart size is normal. No substantial pericardial effusion. Coronary artery calcification is evident. Mild atherosclerotic calcification is noted in the wall of the thoracic aorta. Mediastinum/Nodes: No mediastinal lymphadenopathy. No evidence for Odyn Turko hilar lymphadenopathy although assessment is limited by the lack of intravenous contrast on the current study. The esophagus has normal imaging features. There is no axillary lymphadenopathy. Lungs/Pleura: Centrilobular and paraseptal emphysema evident. Scattered tiny bilateral pulmonary nodules are stable in the interval. Specifically, the 6.8 mm new left upper lobe nodule identified previously is unchanged in the interval measuring minimally smaller today at 5.5 mm. No new suspicious pulmonary nodule or mass. Fiducial markers are again noted in the left lung. Minimal stable scarring noted in the lingula. No focal airspace consolidation. No pleural effusion. Upper Abdomen: Visualized portion of the upper abdomen shows no acute findings. Musculoskeletal: No worrisome lytic or  sclerotic osseous abnormality. Stable compression deformity at T7 and T8. IMPRESSION: Lung-RADS 2, benign appearance or behavior. Continue annual screening with low-dose chest CT without contrast in 12 months. Aortic Atherosclerosis (ICD10-I70.0) and Emphysema (ICD10-J43.9). Electronically Signed   By: Camellia Candle M.D.   On: 11/12/2024 06:59     .Elspeth KYM Schultze, M.D., F.A.C.S. Gastrointestinal and Minimally Invasive Surgery [facility] [address] [place], [place] [address] [phone] Main / Paging  11/20/2024 11:38 AM    Elspeth JAYSON Schultze      [1]  Allergies Allergen Reactions   Codeine Nausea And Vomiting   Erythromycin Nausea And Vomiting   Ampicillin Nausea And Vomiting, Rash and Other (See Comments)    Pt states that rash was on her tongue.  Has patient had a PCN reaction causing immediate rash, facial/tongue/throat swelling, SOB or lightheadedness with hypotension yes Has patient had a PCN reaction causing severe rash involving mucus membranes or skin necrosis: no Has patient had a PCN reaction that required hospitalization yes Has patient had a PCN reaction occurring within the last 10 years: no If all of the above answers are NO, then may proceed with Cephalosporin use.

## 2024-11-20 NOTE — Consult Note (Signed)
 WOC consulted for marking, patient was marked preoperatively in PAT on 11/08/24.   Myrth Dahan [facility], CNS, CWON-AP [phone]

## 2024-11-20 NOTE — Op Note (Signed)
 Operative Note  Preoperative diagnosis:  1.  Colovesical fistula  Postoperative diagnosis: 1.  Colovesical fistula  Procedure(s): 1.  Cystoscopy with bilateral ureteral FireFly injections  Surgeon: Sherwood Edison, MD  Assistants:  None   Anesthesia:  General  Complications:  None  EBL:  <5 mL  Specimens: 1. None  Drains/Catheters: 1.  16 French Foley  Intraoperative findings:   No intravesical pathology was seen on cystoscopy  Indication:  The patient is a 69 year old female with a history of colovesical fistula requiring a partial colectomy with Dr. Sheldon.  Urology has been consulted to performed cystoscopy with bilateral ureteral Fire Fly injection to aide in intraoperative ureteral identification. The patient has been consented for the above procedures, voices understanding and wishes to proceed.  The patient has been consented for the above procedures, voices understanding and wishes to procede  Description of procedure: After informed consent was obtained, the patient was brought to the operating room and general endotracheal anesthesia was administered. The patient was then placed in the dorsolithotomy position and prepped and draped in usual sterile fashion. A timeout was performed. A 21 French rigid cystoscope was then inserted into the urethral meatus and advanced into the bladder under direct vision. A complete bladder survey revealed no intravesical pathology.   A sensor wire was advanced up the left ureter followed by advancement of a 6 French open-ended catheter over the wire and up the ureter.  Wire was withdrawn and a total of 7.5 mL of firefly solution diluted with 10 mL of saline was injected into the left collecting system. A similar maneuver was then carried out on the right with the same volume and concentration of firefly. The rigid cystoscope was then removed under direct vision. A 16 French Foley catheter was then inserted and placed to gravity drainage. The  patient tolerated the procedure well. Dr. Sheldon then proceeded with their portion of the case  Plan:  Foley removal is at the discretion of the primary team.

## 2024-11-20 NOTE — Discharge Instructions (Signed)
 SURGERY: POST OP INSTRUCTIONS (Surgery for small bowel obstruction, colon resection, etc)   ######################################################################  EAT Gradually transition to a high fiber diet with a fiber supplement over the next few days after discharge  WALK Walk an hour a day.  Control your pain to do that.    CONTROL PAIN Control pain so that you can walk, sleep, tolerate sneezing/coughing, go up/down stairs.  HAVE A BOWEL MOVEMENT DAILY Keep your bowels regular to avoid problems.  OK to try a laxative to override constipation.  OK to use an antidairrheal to slow down diarrhea.  Call if not better after 2 tries  CALL IF YOU HAVE PROBLEMS/CONCERNS Call if you are still struggling despite following these instructions. Call if you have concerns not answered by these instructions  ######################################################################   DIET Follow a light diet the first few days at home.  Start with a bland diet such as soups, liquids, starchy foods, low fat foods, etc.  If you feel full, bloated, or constipated, stay on a ful liquid or pureed/blenderized diet for a few days until you feel better and no longer constipated. Be sure to drink plenty of fluids every day to avoid getting dehydrated (feeling dizzy, not urinating, etc.). Gradually add a fiber supplement to your diet over the next week.  Gradually get back to a regular solid diet.  Avoid fast food or heavy meals the first week as you are more likely to get nauseated. It is expected for your digestive tract to need a few months to get back to normal.  It is common for your bowel movements and stools to be irregular.  You will have occasional bloating and cramping that should eventually fade away.  Until you are eating solid food normally, off all pain medications, and back to regular activities; your bowels will not be normal. Focus on eating a low-fat, high fiber diet the rest of your life  (See Getting to Good Bowel Health, below).  CARE of your INCISION or WOUND  It is good for closed incisions and even open wounds to be washed every day.  Shower every day.  Short baths are fine.  Wash the incisions and wounds clean with soap & water .    You may leave closed incisions open to air if it is dry.   You may cover the incision with clean gauze & replace it after your daily shower for comfort.  TEGADERM:  You have clear gauze band-aid dressings over your closed incision(s).  Remove the dressings 2 days after surgery = 2/6 Friday.   ACTIVITIES as tolerated Start light daily activities --- self-care, walking, climbing stairs-- beginning the day after surgery.  Gradually increase activities as tolerated.  Control your pain to be active.  Stop when you are tired.  Ideally, walk several times a day, eventually an hour a day.   Most people are back to most day-to-day activities in a few weeks.  It takes 4-8 weeks to get back to unrestricted, intense activity. If you can walk 30 minutes without difficulty, it is safe to try more intense activity such as jogging, treadmill, bicycling, low-impact aerobics, swimming, etc. Save the most intensive and strenuous activity for last (Usually 4-8 weeks after surgery) such as sit-ups, heavy lifting, contact sports, etc.  Refrain from any intense heavy lifting or straining until you are off narcotics for pain control.  You will have off days, but things should improve week-by-week. DO NOT PUSH THROUGH PAIN.  Let pain be your guide: If  it hurts to do something, don't do it.  Pain is your body warning you to avoid that activity for another week until the pain goes down. You may drive when you are no longer taking narcotic prescription pain medication, you can comfortably wear a seatbelt, and you can safely make sudden turns/stops to protect yourself without hesitating due to pain. You may have sexual intercourse when it is comfortable. If it hurts to do  something, stop.   MEDICATIONS Take your usually prescribed home medications unless otherwise directed.    Blood thinners:  You can restart any strong blood thinners after the second postoperative day  for example: COUMADIN (warfarin), XERELTO (rivaroxaban), ELIQUIS (apixaban), PLAVIX (clopidigrel), BRILINTA (ticagrelor), EFFIENT (prasugrel), PRADAXA (dabigatran), etc  Continue aspirin  before & after surgery..     Some oozing/bleeding the first 1-2 weeks is common but should taper down & be small volume.    If you are passing many large clots or having uncontrolling bleeding, call your surgeon    PAIN CONTROL Pain after surgery or related to activity is often due to strain/injury to muscle, tendon, nerves and/or incisions.  This pain is usually short-term and will improve in a few months.  To help speed the process of healing and to get back to regular activity more quickly, DO THE FOLLOWING THINGS TOGETHER: Increase activity gradually.  DO NOT PUSH THROUGH PAIN Use Ice and/or Heat Try Gentle Massage and/or Stretching Take over the counter pain medication Take Narcotic prescription pain medication for more severe pain  Good pain control = faster recovery.  It is better to take more medicine to be more active than to stay in bed all day to avoid medications.  Increase activity gradually Avoid heavy lifting at first, then increase to lifting as tolerated over the next 6 weeks. Do not push through the pain.  Listen to your body and avoid positions and maneuvers than reproduce the pain.  Wait a few days before trying something more intense Walking an hour a day is encouraged to help your body recover faster and more safely.  Start slowly and stop when getting sore.  If you can walk 30 minutes without stopping or pain, you can try more intense activity (running, jogging, aerobics, cycling, swimming, treadmill, sex, sports, weightlifting, etc.) Remember: If it hurts to do it, then dont  do it! Use Ice and/or Heat You will have swelling and bruising around the incisions.  This will take several weeks to resolve. Ice packs or heating pads (6-8 times a day, 30-60 minutes at a time) will help sooth soreness & bruising. Some people prefer to use ice alone, heat alone, or alternate between ice & heat.  Experiment and see what works best for you.  Consider trying ice for the first few days to help decrease swelling and bruising; then, switch to heat to help relax sore spots and speed recovery. Shower every day.  Short baths are fine.  It feels good!  Keep the incisions and wounds clean with soap & water .   Try Gentle Massage and/or Stretching Massage at the area of pain many times a day Stop if you feel pain - do not overdo it Take over the counter pain medication This helps the muscle and nerve tissues become less irritable and calm down faster Choose ONE of the following over-the-counter anti-inflammatory medications: Acetaminophen  500mg  tabs (Tylenol ) 1-2 pills with every meal and just before bedtime (avoid if you have liver problems or if you have acetaminophen  in you narcotic  prescription) Naproxen 220mg  tabs (ex. Aleve, Naprosyn) 1-2 pills twice a day (avoid if you have kidney, stomach, IBD, or bleeding problems) Ibuprofen  200mg  tabs (ex. Advil , Motrin ) 3-4 pills with every meal and just before bedtime (avoid if you have kidney, stomach, IBD, or bleeding problems) Take with food/snack several times a day as directed for at least 2 weeks to help keep pain / soreness down & more manageable. Take Narcotic prescription pain medication for more severe pain A prescription for strong pain control is often given to you upon discharge (for example: oxycodone /Percocet, hydrocodone /Norco/Vicodin, or tramadol /Ultram ) Take your pain medication as prescribed. Be mindful that most narcotic prescriptions contain Tylenol  (acetaminophen ) as well - avoid taking too much Tylenol . If you are having  problems/concerns with the prescription medicine (does not control pain, nausea, vomiting, rash, itching, etc.), please call us  [phone] to see if we need to switch you to a different pain medicine that will work better for you and/or control your side effects better. If you need a refill on your pain medication, you must call the office before 4 pm and on weekdays only.  By federal law, prescriptions for narcotics cannot be called into a pharmacy.  They must be filled out on paper & picked up from our office by the patient or authorized caretaker.  Prescriptions cannot be filled after 4 pm nor on weekends.    WHEN TO CALL US  ([phone] Severe uncontrolled or worsening pain  Fever over 101 F (38.5 C) Concerns with the incision: Worsening pain, redness, rash/hives, swelling, bleeding, or drainage Reactions / problems with new medications (itching, rash, hives, nausea, etc.) Nausea and/or vomiting Difficulty urinating Difficulty breathing Worsening fatigue, dizziness, lightheadedness, blurred vision Other concerns If you are not getting better after two weeks or are noticing you are getting worse, contact our office ([phone] for further advice.  We may need to adjust your medications, re-evaluate you in the office, send you to the emergency room, or see what other things we can do to help. The clinic staff is available to answer your questions during regular business hours (8:30am-5pm).  Please dont hesitate to call and ask to speak to one of our nurses for clinical concerns.    A surgeon from [facility] is always on call at the hospitals 24 hours/day If you have a medical emergency, go to the nearest emergency room or call 911.  FOLLOW UP in our office One the day of your discharge from the hospital (or the next business weekday), please call [facility] to set up or confirm an appointment to see your surgeon in the office for a follow-up appointment.   Usually it is 2-3 weeks after your surgery.   If you have skin staples at your incision(s), let the office know so we can set up a time in the office for the nurse to remove them (usually around 10 days after surgery). Make sure that you call for appointments the day of discharge (or the next business weekday) from the hospital to ensure a convenient appointment time. IF YOU HAVE DISABILITY OR FAMILY LEAVE FORMS, BRING THEM TO THE OFFICE FOR PROCESSING.  DO NOT GIVE THEM TO YOUR DOCTOR.  [facility] [address], [place], [place]  [address] ? [phone] - Main [phone] - Toll Free,  [phone] - Fax [email or web address]    GETTING TO GOOD BOWEL HEALTH. It is expected for your digestive tract to need a few months  to get back to normal.  It is common for your bowel movements and stools to be irregular.  You will have occasional bloating and cramping that should eventually fade away.  Until you are eating solid food normally, off all pain medications, and back to regular activities; your bowels will not be normal.   Avoiding constipation The goal: ONE SOFT BOWEL MOVEMENT A DAY!    Drink plenty of fluids.  Choose water  first. TAKE A FIBER SUPPLEMENT EVERY DAY THE REST OF YOUR LIFE During your first week back home, gradually add back a fiber supplement every day Experiment which form you can tolerate.   There are many forms such as powders, tablets, wafers, gummies, etc Psyllium bran (Metamucil), methylcellulose (Citrucel), Miralax  or Glycolax , Benefiber, Flax Seed.  Adjust the dose week-by-week (1/2 dose/day to 6 doses a day) until you are moving your bowels 1-2 times a day.  Cut back the dose or try a different fiber product if it is giving you problems such as diarrhea or bloating. Sometimes a laxative is needed to help jump-start bowels if constipated until the fiber supplement can help regulate your bowels.  If you are tolerating eating  & you are farting, it is okay to try a gentle laxative such as double dose MiraLax , prune juice, or Milk of Magnesia.  Avoid using laxatives too often. Stool softeners can sometimes help counteract the constipating effects of narcotic pain medicines.  It can also cause diarrhea, so avoid using for too long. If you are still constipated despite taking fiber daily, eating solids, and a few doses of laxatives, call our office. Controlling diarrhea Try drinking liquids and eating bland foods for a few days to avoid stressing your intestines further. Avoid dairy products (especially milk & ice cream) for a short time.  The intestines often can lose the ability to digest lactose when stressed. Avoid foods that cause gassiness or bloating.  Typical foods include beans and other legumes, cabbage, broccoli, and dairy foods.  Avoid greasy, spicy, fast foods.  Every person has some sensitivity to other foods, so listen to your body and avoid those foods that trigger problems for you. Probiotics (such as active yogurt, Align, etc) may help repopulate the intestines and colon with normal bacteria and calm down a sensitive digestive tract Adding a fiber supplement gradually can help thicken stools by absorbing excess fluid and retrain the intestines to act more normally.  Slowly increase the dose over a few weeks.  Too much fiber too soon can backfire and cause cramping & bloating. It is okay to try and slow down diarrhea with a few doses of antidiarrheal medicines.   Bismuth  subsalicylate (ex. Kayopectate, Pepto Bismol) for a few doses can help control diarrhea.  Avoid if pregnant.   Loperamide  (Imodium ) can slow down diarrhea.  Start with one tablet (2mg ) first.  Avoid if you are having fevers or severe pain.  ILEOSTOMY PATIENTS WILL HAVE CHRONIC DIARRHEA since their colon is not in use.    Drink plenty of liquids.  You will need to drink even more glasses of water /liquid a day to avoid getting dehydrated. Record  output from your ileostomy.  Expect to empty the bag every 3-4 hours at first.  Most people with a permanent ileostomy empty their bag 4-6 times at the least.   Use antidiarrheal medicine (especially Imodium ) several times a day to avoid getting dehydrated.  Start with a dose at bedtime & breakfast.  Adjust up or down as needed.  Increase antidiarrheal medications as directed to avoid emptying the bag more than 8 times a day (every 3 hours). Work with your wound ostomy nurse to learn care for your ostomy.  See ostomy care instructions. TROUBLESHOOTING IRREGULAR BOWELS 1) Start with a soft & bland diet. No spicy, greasy, or fried foods.  2) Avoid gluten/wheat or dairy products from diet to see if symptoms improve. 3) Miralax  17gm or flax seed mixed in 8oz. water  or juice-daily. May use 2-4 times a day as needed. 4) Gas-X, Phazyme, etc. as needed for gas & bloating.  5) Prilosec (omeprazole) over-the-counter as needed 6)  Consider probiotics (Align, Activa, etc) to help calm the bowels down  Call your doctor if you are getting worse or not getting better.  Sometimes further testing (cultures, endoscopy, X-ray studies, CT scans, bloodwork, etc.) may be needed to help diagnose and treat the cause of the diarrhea. [facility] [address], [place], [place]  [address] [phone] - Main.    [phone]  - Toll Free.   [phone] - Fax [email or web address]

## 2024-11-20 NOTE — Consult Note (Signed)
 "  NAME:  Lynn Kline, MRN:  [id number], DOB:  1955/11/03, LOS: 0 ADMISSION DATE:  11/20/2024, CONSULTATION DATE:  11/20/24 REFERRING MD:  anesthesia CHIEF COMPLAINT:  acute resp failure   History of Present Illness:  69 yo presented for colectomy and take down of colovesical fistula. Completed procedure but post operatively in OR pt required narcan  for extubation. She remained somnolent but arousable. Pt was transferred to PACU and remained altered, Abg revealed co2 of >80 and pt was reintubated. Baseline pt has COPD on 3L [place] at home. She was at high risk for prolonged intubation pre-operatively and pulmonary aware prior to surgery. With her reintubation CCM was asked to transfer to ICU and manage vent. BP remained stable with exception of at induction when she required neo push. Currently, no infusion required.   Pt was intubated prior to my assessment so all history obtained from chart and no ROS was able to be obtained.   Pertinent  Medical History   Chronic hypoxic resp failure 3L baseline copd Diverticulosis with colovesical fistula Prediabetes GAD Hyperlipidemia IBS   Significant Hospital Events: Including procedures, antibiotic start and stop dates in addition to other pertinent events   Admitted to hospital for surgery 2/4,  post operatively reintubated Transferred to ICU 2/4 and ccm consulted  Interim History / Subjective:    Objective    Blood pressure (!) 80/55, pulse 82, temperature (!) 97.3 F (36.3 C), resp. rate 18, height 5' 2 (1.575 m), weight 50.8 kg, SpO2 100%.    Vent Mode: PRVC FiO2 (%):  [100 %] 100 % Set Rate:  [18 bmp] 18 bmp Vt Set:  [400 mL] 400 mL PEEP:  [5 cmH20] 5 cmH20 Plateau Pressure:  [14 cmH20] 14 cmH20   Intake/Output Summary (Last 24 hours) at 11/20/2024 2017 Last data filed at 11/20/2024 1915 Gross per 24 hour  Intake 1800 ml  Output 780 ml  Net 1020 ml   Filed Weights   11/20/24 1011  Weight: 50.8 kg    Examination: General:  sedated unresponsive on vent, appears chronically ill HENT: ncat, perrla, mmmp Lungs: severely diminished bilaterally Cardiovascular: rrr Abdomen: soft, post surgical drain and wounds noted. Dressings with sanguinous drainage noted, bs absent  Extremities: no c/c/e Neuro: unresponsive on sedation GU: deferred  Resolved problem list   Assessment and Plan  Colovesical fistula s/p colectomy and anastomosis S/p Colovesical fistula take down and repair Acute metabolic (hypercarbic)and toxic (opioid) encephalopathy Acute on chronic hypoxic/hypercarbic resp failure (baseline 3L) H/o pulm nodule Copd Tobacco abuse IBS Prediabetes GAD Diverticulosis - titrate vent as able -vap protocol -goal sat 88%, will increase peep for now to 8 with needing 100% -no post intubation cxr obtained will order now.  -sat/sbt when able -og to be placed to gravity, kub pending -pad protocol -cont bronchodilators -resume home inhalers once able -post operative management per surgery -perioperative abx per surgery -ssi -initiate vasopressors as needed for map >60     Labs   CBC: No results for input(s): WBC, NEUTROABS, HGB, HCT, MCV, PLT in the last 168 hours.  Basic Metabolic Panel: No results for input(s): NA, K, CL, CO2, GLUCOSE, BUN, CREATININE, CALCIUM , MG, PHOS in the last 168 hours. GFR: Estimated Creatinine Clearance: 52.5 mL/min (by C-G formula based on SCr of 0.48 mg/dL). No results for input(s): PROCALCITON, WBC, LATICACIDVEN in the last 168 hours.  Liver Function Tests: No results for input(s): AST, ALT, ALKPHOS, BILITOT, PROT, ALBUMIN  in the last 168 hours. No results for input(s):  LIPASE, AMYLASE in the last 168 hours. No results for input(s): AMMONIA in the last 168 hours.  ABG    Component Value Date/Time   PHART 7.2 (L) 11/20/2024 1845   PCO2ART 84 (HH) 11/20/2024 1845   PO2ART 111 (H) 11/20/2024 1845   HCO3 33.2  (H) 11/20/2024 1845   O2SAT 99.7 11/20/2024 1845     Coagulation Profile: No results for input(s): INR, PROTIME in the last 168 hours.  Cardiac Enzymes: No results for input(s): CKTOTAL, CKMB, CKMBINDEX, TROPONINI in the last 168 hours.  HbA1C: Hgb A1c MFr Bld  Date/Time Value Ref Range Status  11/08/2024 10:55 AM 5.5 4.8 - 5.6 % Final    Comment:    (NOTE) Diagnosis of Diabetes The following HbA1c ranges recommended by the American Diabetes Association (ADA) may be used as an aid in the diagnosis of diabetes mellitus.  Hemoglobin             Suggested A1C NGSP%              Diagnosis  <5.7                   Non Diabetic  5.7-6.4                Pre-Diabetic  >6.4                   Diabetic  <7.0                   Glycemic control for                       adults with diabetes.    11/29/2023 03:09 PM 5.8 4.6 - 6.5 % Final    Comment:    Glycemic Control Guidelines for People with Diabetes:Non Diabetic:  <6%Goal of Therapy: <7%Additional Action Suggested:  >8%     CBG: No results for input(s): GLUCAP in the last 168 hours.  Review of Systems:   Unobtainable 2/2 intubated and sedated status  Past Medical History:  She,  has a past medical history of Anxiety, Atherosclerosis, Colon polyp, hyperplastic, Colovesical fistula, COPD (chronic obstructive pulmonary disease) (HCC), Diverticulosis, Family history of adverse reaction to anesthesia, Gallstone, and Pneumonia.   Surgical History:   Past Surgical History:  Procedure Laterality Date   ABDOMINAL HYSTERECTOMY     BRONCHIAL BIOPSY  11/03/2020   Procedure: BRONCHIAL BIOPSIES;  Surgeon: Brenna Adine CROME, DO;  Location: [facility];  Service: Pulmonary;;   BRONCHIAL BRUSHINGS  11/03/2020   Procedure: BRONCHIAL BRUSHINGS;  Surgeon: Brenna Adine CROME, DO;  Location: [facility];  Service: Pulmonary;;   BRONCHIAL NEEDLE ASPIRATION BIOPSY  11/03/2020   Procedure: BRONCHIAL NEEDLE ASPIRATION BIOPSIES;   Surgeon: Brenna Adine CROME, DO;  Location: [facility];  Service: Pulmonary;;   BRONCHIAL WASHINGS  11/03/2020   Procedure: BRONCHIAL WASHINGS;  Surgeon: Brenna Adine CROME, DO;  Location: [facility];  Service: Pulmonary;;   CESAREAN SECTION     CHOLECYSTECTOMY     COLONOSCOPY     COLONOSCOPY N/A 11/18/2024   Procedure: COLONOSCOPY;  Surgeon: Suzann Inocente HERO, MD;  Location: [facility];  Service: Gastroenterology;  Laterality: N/A;   FIDUCIAL MARKER PLACEMENT  11/03/2020   Procedure: FIDUCIAL MARKER PLACEMENT;  Surgeon: Brenna Adine CROME, DO;  Location: [facility];  Service: Pulmonary;;   LUNG BIOPSY     VIDEO BRONCHOSCOPY WITH ENDOBRONCHIAL NAVIGATION N/A 11/03/2020   Procedure: VIDEO BRONCHOSCOPY WITH ENDOBRONCHIAL NAVIGATION AND  POSSIBLE ULTRASOUND;  Surgeon: Brenna Adine CROME, DO;  Location: [facility];  Service: Pulmonary;  Laterality: N/A;   VIDEO BRONCHOSCOPY WITH ENDOBRONCHIAL ULTRASOUND  11/03/2020   Procedure: VIDEO BRONCHOSCOPY WITH ENDOBRONCHIAL ULTRASOUND;  Surgeon: Brenna Adine CROME, DO;  Location: [facility];  Service: Pulmonary;;     Social History:   reports that she quit smoking about 2 years ago. Her smoking use included cigarettes. She has been exposed to tobacco smoke. She has never used smokeless tobacco. She reports that she does not drink alcohol and does not use drugs.   Family History:  Her family history includes Arthritis in her mother; Breast cancer in her maternal grandmother; COPD in her mother; Cancer in her father; Emphysema in her mother; Heart disease in her father; Stroke in her father. There is no history of Colon cancer, Rectal cancer, or Stomach cancer.   Allergies Allergies[1]   Home Medications  Prior to Admission medications  Medication Sig Start Date End Date Taking? Authorizing Provider  albuterol  (PROVENTIL ) (2.5 MG/3ML) 0.083% nebulizer solution Take 3 mLs (2.5 mg total) by nebulization every 4 (four) hours as needed for wheezing or  shortness of breath. 09/16/24  Yes Joshua Debby CROME, MD  ALPRAZolam  (XANAX ) 0.25 MG tablet Take 1 tablet (0.25 mg total) by mouth 2 (two) times daily as needed for anxiety. 10/24/24  Yes Joshua Debby CROME, MD  budesonide -glycopyrrolate -formoterol  (BREZTRI  AEROSPHERE) 160-9-4.8 MCG/ACT AERO inhaler Inhale 2 puffs into the lungs in the morning and at bedtime. 04/12/24  Yes Cobb, Comer GAILS, NP  cholecalciferol  (VITAMIN D3) 25 MCG (1000 UNIT) tablet Take 1,000 Units by mouth daily.   Yes [provider]  nitrofurantoin (MACRODANTIN) 100 MG capsule Take 100 mg by mouth at bedtime. 10/11/24  Yes [provider]  OXYGEN  Inhale 3 L/min into the lungs continuous.   Yes [provider]  traMADol  (ULTRAM ) 50 MG tablet Take 1-2 tablets (50-100 mg total) by mouth every 6 (six) hours as needed for moderate pain (pain score 4-6) or severe pain (pain score 7-10). 11/20/24  Yes [name], MD  polycarbophil (FIBERCON) 625 MG tablet Take 1 tablet (625 mg total) by mouth 2 (two) times daily. Patient not taking: Reported on 11/01/2024 09/16/24   Jillian Buttery, MD    Critical care time: The patient is critically ill with multiple organ systems failure and requires high complexity decision making for assessment and support, frequent evaluation and titration of therapies, application of advanced monitoring technologies and extensive interpretation of multiple databases.  Critical care time 41 mins. This represents my time independent of the NPs time taking care of the pt. This is excluding procedures.    Harlene Na DO [facility] 11/20/2024, 8:17 PM See Amion for pager If no response to pager, please call [phone] until 1900 After 1900 please call [facility] [phone]           [1]  Allergies Allergen Reactions   Codeine Nausea And Vomiting   Erythromycin Nausea And Vomiting   Ampicillin Nausea And Vomiting, Rash and Other (See Comments)    Pt states that  rash was on her tongue.  Has patient had a PCN reaction causing immediate rash, facial/tongue/throat swelling, SOB or lightheadedness with hypotension yes Has patient had a PCN reaction causing severe rash involving mucus membranes or skin necrosis: no Has patient had a PCN reaction that required hospitalization yes Has patient had a PCN reaction occurring within the last 10 years: no If all of the above  answers are NO, then may proceed with Cephalosporin use.    "

## 2024-11-20 NOTE — Plan of Care (Signed)
" °  Problem: Education: Goal: Understanding of discharge needs will improve Outcome: Not Progressing Goal: Verbalization of understanding of the causes of altered bowel function will improve Outcome: Not Progressing   Problem: Activity: Goal: Ability to tolerate increased activity will improve Outcome: Not Progressing   Problem: Bowel/Gastric: Goal: Gastrointestinal status for postoperative course will improve Outcome: Not Progressing   Problem: Nutritional: Goal: Will attain and maintain optimal nutritional status will improve Outcome: Not Progressing   Problem: Respiratory: Goal: Respiratory status will improve Outcome: Not Progressing   "

## 2024-11-20 NOTE — Consult Note (Addendum)
" °  CLINICAL SUPPORT TEAM - WOUND OSTOMY AND CONTINENCE TEAM  CONSULTATION SERVICES   WOC Nurse-Inpatient Note   WOC Nurse requested for preoperative stoma site marking 11/08/2024  Discussed surgical procedure and stoma creation with patient and family.  Explained role of the WOC nurse team.  Provided the patient with educational booklet and provided samples of pouching options.  Answered patient and family questions.   Examined patient sitting, and standing in order to place the marking in the patient's visual field, away from any creases or abdominal contour issues and within the rectus muscle.  Attempted to mark below the patient's belt line.   Marked for colostomy in the LLQ 6 cm to the left of the umbilicus and 3 cm above/below the umbilicus.  Marked for ileostomy in the RLQ 6 cm to the right of the umbilicus and 3 cm above/below the umbilicus.  Patient's abdomen cleansed with CHG wipes at site markings, allowed to air dry prior to marking. Covered mark with thin film transparent dressing to preserve mark until date of surgery.   WOC team will follow further. Please reconsult if further assistance is needed. Thank-you,  Lela Holm MSN, RN, CWCN, CNS.  (Phone [phone])     "

## 2024-11-20 NOTE — Progress Notes (Signed)
 eLink Physician-Brief Progress Note Patient Name: ADRINNE SZE DOB: 07-Jan-1956 MRN: [id number]   Date of Service  11/20/2024  HPI/Events of Note  KUB reviewed.  eICU Interventions  Okay to use OG tube.        Rayjon Wery U Kabella Cassidy 11/20/2024, 11:23 PM

## 2024-11-20 NOTE — Transfer of Care (Signed)
 Immediate Anesthesia Transfer of Care Note  Patient: Lynn Kline  Procedure(s) Performed: COLECTOMY, SIGMOID, ROBOT-ASSISTED CREATION, COLOSTOMY, DIVERTING, LAPAROSCOPIC SIGMOIDOSCOPY, FLEXIBLE CYSTOSCOPY WITH INDOCYANINE GREEN IMAGING (ICG)  Patient Location: PACU  Anesthesia Type:General  Level of Consciousness: drowsy  Airway & Oxygen  Therapy: Patient Spontanous Breathing and Patient connected to nasal cannula oxygen   Post-op Assessment: Report given to RN and Post -op Vital signs reviewed and stable  Post vital signs: Reviewed and stable  Last Vitals:  Vitals Value Taken Time  BP 170/94 11/20/24 16:21  Temp    Pulse 99 11/20/24 16:26  Resp 16 11/20/24 16:26  SpO2 97 % 11/20/24 16:26  Vitals shown include unfiled device data.  Last Pain:  Vitals:   11/20/24 1011  TempSrc: Oral  PainSc: 0-No pain         Complications: No notable events documented.

## 2024-11-20 NOTE — Progress Notes (Signed)
 Late entry PCO2 84,notified to Dr Peggye @1920 , intubated@1934 

## 2024-11-20 NOTE — Op Note (Signed)
 11/20/2024  4:16 PM  PATIENT:  Lynn Kline  69 y.o. female  Patient Care Team: [name], MD as PCP - General (Internal Medicine) Szabat, [name], [facility] (Inactive) as Pharmacist (Pharmacist) [name], DO as Consulting Physician (Pulmonary Disease) [name], MD as Consulting Physician (Gastroenterology) [name], MD as Consulting Physician (General Surgery) [name], MD as Consulting Physician (Urology)  PRE-OPERATIVE DIAGNOSIS:   POST-OPERATIVE DIAGNOSIS:  COLOVESICAL FISTULA  PROCEDURE:   -ROBOTIC LOW ANTERIOR RECTOSIGMOID RESECTION (LAR) WITH ANASTOMOSIS -COLOVESICAL FISTULA TAKEDOWN & REPAIR -MOBILIZATION OF SPLENIC FLEXURE OF COLON -EN-BLOC LEFT SALPINGO-OOPHORECTOMY -INTRAOPERATIVE ASSESSMENT OF TISSUE VASCULAR PERFUSION USING ICG (indocyanine green) IMMUNOFLUORESCENCE -TRANSVERSUS ABDOMINIS PLANE (TAP) BLOCK - BILATERAL -FLEXIBLE SIGMOIDOSCOPY  SURGEON:  Standing KYM Sheldon, MD  ASSISTANT:  Bernarda Debby, MD  An experienced assistant was required given the standard of surgical care given the complexity of the case.  This assistant was needed for exposure, dissection, suction, tissue approximation, retraction, perception, etc  ANESTHESIA:  General endotracheal intubation anesthesia (GETA) and Regional TRANSVERSUS ABDOMINIS PLANE (TAP) nerve block -BILATERAL for perioperative & postoperative pain control at the level of the transverse abdominis & preperitoneal spaces along the flank at the anterior axillary line, from subcostal ridge to iliac crest under laparoscopic guidance provided with 60 mL of bupivicaine 0.25% with epinephrine   Estimated Blood Loss (EBL):   Total I/O In: 1000 [I.V.:1000] Out: 450 [Urine:400; Blood:50].   (See anesthesia record)  Delay start of Pharmacological VTE agent (>24hrs) due to concerns of significant anemia, surgical blood loss, or risk of bleeding?:  no  DRAINS: (None) and 19 Fr Blake drain the tip  resting in the pelvis  SPECIMEN:  Rectosigmoid (open end proximal), Ovary & fallopian tube - LEFT, and Distal anastomotic ring (FINAL DISTAL MARGIN)  DISPOSITION OF SPECIMEN:  Pathology  COUNTS:  Sponge, needle, & instrument counts CORRECT at the conclusion of the case.      PLAN OF CARE: Admit to inpatient   PATIENT DISPOSITION:  PACU - hemodynamically stable.  INDICATION:    Pleasant woman with COPD oxygen  dependent with recurrent UTIs and found to have colovesical fistula.  Hospitalized few months ago.  Stabilized.  Colonoscopy showing significant sigmoid stricturing but no definite cancer.  Evaluated by pulmonary.  Above average risk given her COPD but not strictly prohibitive.  I recommended segmental resection:  The anatomy & physiology of the digestive tract was discussed.  The pathophysiology was discussed.  Natural history risks without surgery was discussed.   I worked to give an overview of the disease and the frequent need to have multispecialty involvement.  I feel the risks of no intervention will lead to serious problems that outweigh the operative risks; therefore, I recommended a partial colectomy to remove the pathology.  Laparoscopic & open techniques were discussed.   Risks such as bleeding, infection, abscess, leak, reoperation, possible ostomy, hernia, heart attack, death, and other risks were discussed.  I noted a good likelihood this will help address the problem.   Goals of post-operative recovery were discussed as well.  We will work to minimize complications.  Educational materials on the pathology had been given in the office.  Questions were answered.    The patient expressed understanding & wished to proceed with surgery.  OR FINDINGS:   Patient had thickened inflamed rectosigmoid colon densely adherent to the left dome of the bladder and the classic location for colovesical fistula.  Taken down.  Left adnexa densely adherent to  it with the ovary obliterated  salpingo-oophorectomy.  Right adnexa atrophic and noninflamed.  Some moderate omental adhesions to the region but no small bowel involvement.    Evidence of focal cystitis and probable fistula endoluminal leak but no full-thickness active colovesical fistula on bladder insufflation.  No obvious metastatic disease on visceral parietal peritoneum or liver.  It is a 29mm EEA anastomosis ( distal descending colon  connected to proximal/mid rectum junction.)  It rests 12 cm from the anal verge by flexible sigmoidoscopy  CASE DATA: Type of patient?: Elective [facility] Private Case Status of Case? Elective Scheduled Infection Present At Time Of Surgery (PATOS)?  PHLEGMON   DESCRIPTION:   Informed consent was confirmed.  The patient underwent general anaesthesia without difficulty.  The patient was positioned appropriately.  VTE prevention in place.  Patient underwent cystoscopy with ICG firefly infiltration of bilateral ureters by Dr. Carolee with [facility].  He noted inflammation on the bladder consistent with colovesical fistula.  No other concerns.  Please see his separate operative report  The patient was clipped, prepped, & draped in a sterile fashion.  Surgical timeout confirmed our plan.  The patient was positioned in reverse Trendelenburg.  Abdominal entry was gained using Varess technique at the left subcostal ridge on the anterior abdominal wall.  No elevated EtCO2 noted.  Port placed.  Camera inspection revealed no injury.  Extra ports were carefully placed under direct laparoscopic visualization.  Upon entering the abdomen (organ space),we encountered a phlegmon involving the sigmoid colon.   Transected the greater omentum that was densely adhesed into this region and allowed it to rest in the upper abdomen.  I reflected the Small bowel in the upper abdomen.  The patient was carefully positioned.  The [employer] daVinci robot was docked with camera & instruments carefully placed.  Gayleen off some  inflammatory adhesions of the sigmoid colon to the left lower quadrant abdominal wall and anterior pelvis.  Became much more rigid with poor planes.  I focused on a needed a lateral retroperitoneal approach.  I elevated the rectosigmoid mesentery to put the main pedicle on tension.  I scored the base of peritoneum of the medial side of the mesentery of the elevated left colon from the ligament of Treitz to the mid rectum.   I elevated the sigmoid mesentery and entered into the retro-mesenteric plane. We were able to identify the left ureter and gonadal vessels. We kept those posterior within the retroperitoneum and elevated the left colon mesentery off that. I did isolate the inferior mesenteric artery (IMA) pedicle but did not ligate it yet.  I continued distally and got into the avascular plane posterior to the mesorectum, sparing the nervi ergentes.. This allowed me to help mobilize the rectum as well by freeing the mesorectum off the sacrum.  I stayed away from the right and left ureters.  I kept the lateral vascular pedicles to the rectum intact.  Gradually worked to peel the inflamed rectosigmoid mesentery off the left gonadal and ureters.  Came to a concrete section where the left adnexa should be which was rather obliterated.  I worked anteriorly and eventually freed off this inflamed rectosigmoid mesentery off the left pelvis.  The ovary could not be spared along with the fallopian tube so these were transected and freed off.  With that I had better mobilization of the rectosigmoid colon off the pelvis.  I had the circulator infiltrate the bladder with methylene blue  stained sterile fluid.  Filled the bladder to  over 300 mL with good bladder distention.  We so no active leaking of methylene blue  would argue against a full-thickness active leak.  Bladder desufflated.  We then focused on mesorectal dissection.  Freed the mesorectum off the presacral plane until I was distal to the concerning region.   Freed off peritoneum on the lateral sidewalls as well and transected the mesentery of the lateral pedicles to get distal to the area of concern.  Had to go a more distally since there was concrete phlegmonous inflammation in the proximal rectum and mesorectum.  We came around anteriorly such that I had good circumferential mesorectal excision and a good margin distal to the area of concern.    I skeletonized the lymph nodes off the inferior mesenteric artery pedicle.  I went down to its takeoff from the aorta.  I isolated the inferior mesenteric vein off of the ligament of Treitz just cephalad to that as well.  After confirming the left ureter was out of the way, I went ahead and ligated the inferior mesenteric artery pedicle just near its takeoff from the aorta.  I did ligate the inferior mesenteric vein in a similar fashion.  We ensured hemostasis.  I continued medial to lateral dissection to free the left colon mesentery off the retroperitoneum going up towards the splenic flexure to allow good mobility and protect the colon mesentery.  I mobilized the left colon in a lateral to medial fashion off the retroperitoneum and sidewall attachments along the line of Toldt up towards the splenic flexure   Despite dissection, it was apparent that the colon would not reach down the pelvis.  Therefore felt we need to mobilize the splenic flexure of the colon.  Patient was carefully positioned.  Found the greater omentum at the mid transverse colon and freed it off the mid transverse colon heading distally.  Freed greater omentum off the anterior abdominal wall and paracolic gutter and descending colon.  Able to get into the lesser sac and free the lesser omentum off as well.  This helped to identify the superficial aspects of splenic flexure of the colon.  I then alternated between mobilization from the proximal descending colon off the retroperitoneum and left kidney Gerota's fascia/fat pad with coming down to the  inferior peripancreatic ridge.  Came through the visceral peritoneum of the distal transverse colon mesentery and connected with my prior retroperitoneal dissection on the transverse colon and splenic flexure.  With that I continued freeing off the splenic flexure mesentery off the retroperitoneum came around the corner, avoiding injury to the pancreas spleen and kidney.  Straightened out the bend with that got good mobilization of the left colon and splenic flexure to better have the colon reach down into the pelvis.  Hemostasis look good.  Tissues appeared viable without any injury.   to ensure good mobilization of the remaining left colon to reach into the pelvis.   I skeletonized the mesorectum at the proximal rectum.  We then chose a healthy region for the proximal margin that would reach well for our planned anastomosis (distal descending colon).  Transected the colon mesentery radially to preserve good collateral and marginal artery blood supply.  To assess vascular perfusion of tissues, we asked anesthesia use intravenous  indocyanine green (ICG) with IV flush.  I switched to the NIR fluorescence (Firefly mode) imaging window on the daVinci robot platform.  We were able to see good light green visualization of blood vessels with good vascular perfusion of tissues, confirming  good tissue perfusion of tissues (distal descending colon and mid rectum planned for anastomosis.  Then transected at the distal margin with a 60mm green load robotic stapler.  90% across with 1 firing and then had to do additional stapling in the left lateral corner.  We created an extraction incision through a small Pfannenstiel incision in the suprapubic region.  Placed a wound protector.  I was able to eviscerate the rectosigmoid and descending colon out the wound.   I clamped the colon proximal to this area using a reusable pursestringer device.  Passed a 2-0 Keith needle. I transected at the descending/sigmoid junction with a  scalpel. I got healthy bleeding mucosa.  We sent the rectosigmoid colon specimen off to go to pathology.  We sized the colon orifice.  I chose a 29mm EEA anvil stapler system.  I reinforced the prolene pursestring with interrupted silk belt loop sutures.  I placed the anvil to the open end of the proximal remaining colon and closed around it using the pursestring.    We did copious irrigation with crystalloid solution.  Hemostasis was good.  The distal end of the remaining colon easily reached down to the rectal stump, therefore, further colonic mobilization was not needed.      Dr Debby scrubbed down and did gentle anal dilation and advanced the EEA stapler up the rectal stump. The spike was brought out at the provimal end of the rectal stump under direct visualization.  I attached the anvil of the proximal colon the spike of the stapler. Anvil was tightened down and held clamped for 60 seconds.  Orientation was confirmed such that there is no twisting of the colon nor small bowel underneath the mesenteric defect. No concerning tension.  The EEA stapler was fired and held clamped for 30 seconds. The stapler was released & removed. Blue stitch is in the proximal ring.  Care was taken to ensure no other structures were incorporated within this either.  We noted 2 excellent anastomotic rings.   The colon proximal to the anastomosis was then gently occluded. The pelvis was filled with sterile irrigation.  Dr Debby did flexible sigmoidoscopy.  Noted the anastomosis was at 12 cm from the anal verge consistent with the proximal rectum.  Intact anastomosis without active bleeding.  No mucosal ischemia.  There was a negative air leak test. There was no tension of mesentery or bowel at the anastomosis.   Tissues looked viable.  Ureters & bowel uninjured.  The anastomosis looked healthy.  Greater omentum positioned down into the pelvis to help protect the anastomosis.  Given the inflammation and need for left  salpingo-oophorectomy and takedown of colovesical fistula in her bladder I decided to leave a drain down the pelvis.  Secured through her right lower quadrant port site with Prolene suture  Endoluminal gas was evacuated.  Ports & wound protector removed.  We changed gloves & redraped the patient per colon SSI prevention protocol.  We aspirated the sterile irrigation.  Hemostasis was good.  Sterile unused instruments were used from this point.  I closed the skin at the port sites using Monocryl stitch and sterile dressing.  We assured hemostasis and the former ostomy wound.  Wound irrigated.  I closed the posterior rectus fascia with 0 Vicryl suture.  Anterior rectus fascia was closed using #1 PDS transversely.  Sterile dressing placed.   Patient is being extubated go to recovery room. I had discussed postop care with the patient in detail the office &  in the holding area. Instructions are written. I discussed operative findings, updated the patient's status, discussed probable steps to recovery, and gave postoperative recommendations to the patient's sister, Orie Fair.  Recommendations were made.  Questions were answered.  She expressed understanding & appreciation.  Elspeth KYM Schultze, M.D., F.A.C.S. Gastrointestinal and Minimally Invasive Surgery [facility] [address], [address] [place], [place] [address] [phone] Main / Paging

## 2024-11-20 NOTE — Progress Notes (Signed)
 Called Lab to notify that ABG being sent for analysis.

## 2024-11-20 NOTE — H&P (Signed)
 11/20/2024    CARE TEAM: PCP: Joshua Debby CROME, MD   Outpatient Care Team: Patient Care Team: Joshua Debby CROME, MD as PCP - General (Internal Medicine) Szabat, [name], [facility] (Inactive) as Pharmacist (Pharmacist) Brenna Adine CROME, DO as Consulting Physician (Pulmonary Disease) [name], MD as Consulting Physician (Gastroenterology)   Inpatient Treatment Team: Treatment Team:  Jillian Buttery, MD [facility], Md, MD [name], MD Bobbette Pinon, MD Casimir Camelia RAMAN, RPH Wishon, Marry BRAVO, [place] Kriste Asberry BRAVO, RN Raynaldo Vina GAILS, RN Cloud, Ole LOISE Doneta Glenys ONEIDA, RN Arloa Folks D, NP     Problem List:    Principal Problem:   Colovesical fistula Active Problems:   Chronic respiratory failure with hypoxia (HCC)   COPD with asthma (HCC)   Tobacco abuse   IBS (irritable bowel syndrome)   Coronary atherosclerosis due to calcified coronary lesion   Prediabetes   GAD (generalized anxiety disorder)   Diverticulitis     * No surgery found *           Assessment [facility] Stay = 11 days)       Colovesical fistula slowly improving.       Plan:   Diverticulitis with gas and UTI suspicious for colovesical fistula.  There is no frank extraluminal gas or abscess. Colonoscopy 11/18/2024 by Dr. [facility] noted severe stenosis about 25 cm from anal verge consistent with sigmoid stenosis.  Biopsies benign.   Robotic sigmoid colectomy and takedown of colovesical fistula.   The anatomy & physiology of the digestive tract was discussed.  The pathophysiology of  fistula between the bowel and bladder was discussed.  Natural history risks without surgery was discussed. I worked to give an overview of the disease and the frequent need to have multispecialty involvement.    I feel the risks of no intervention will lead to serious problems that outweigh the operative risks; therefore, I recommended surgery to treat the pathology.   Laparoscopic & open techniques for partial proctocolectomy with bladder repair were discussed.  Possible fecal diversion by ostomy was discussed.  We will work to preserve anal & pelvic floor function without sacrificing cure.  Need for prolonged bladder catheterization was discussed.   Risks such as bleeding, infection, abscess, leak, injury to other organs, need for repair of tissues / organs, recurrence with reoperation, possible ostomy, hernia, heart attack, death, and other risks were discussed.  I noted a good likelihood this will help address the problem.   Goals of post-operative recovery were discussed as well.  We will work to minimize complications.  An educational handout on the pathology was given as well.  Questions were answered.     The patient expresses understanding & wishes to proceed with surgery.    With her COPD oxygen  dependency, reevaluate by Dr. Jude for pulmonary disease.  Increased risk but will try and be safe.  Low threshold to put in stepdown unit if needed.  Pulmonary team aware and will try and see her while she is in house to at least start the process   Seen by urology.  Suspect mild hydronephrosis is due to the colovesical fistula most likely due to diverticulitis.  She does not have worsening renal function which is.  Planning ureteral/bladder ICG Firefly at the time of robotic surgery.  If needs urgent open case, would lean towards stents.     Patient concerned about diet tolerances since she feels like meat does not go well and is  worried about other foods.  Will see if nutrition can come by and offer some insights.  I tried to stressed the importance of making sure he has protein in in vitamins to recover and heal and get through a surgery.   I gave her a chance to vent her numerous concerns.  I think she is worried about funding.  She is wearing about support.  She is worried about her other health issues.  Tried to allow her to have some support.  Will ask if  case management can help with that.   -monitor electrolytes & replace as needed  Keep K>4, Mg>2, Phos>3   -VTE prophylaxis- SCDs.  Anticoagulation prophyllaxis SQ as appropriate   -mobilize as tolerated to help recovery.  Enlist therapies in moderate/high risk patients as appropriate   I updated the patient's status to the patient  Recommendations were made.  Questions were answered.  She expressed understanding & appreciation.   -Disposition: TBD       I reviewed nursing notes, Consultant GI notes, hospitalist notes, last 24 h vitals and pain scores, last 48 h intake and output, last 24 h labs and trends, and last 24 h imaging results.  I have reviewed this patient's available data, including medical history, events of note, test results, etc as part of my evaluation.   A significant portion of that time was spent in counseling. Care during the described time interval was provided by me.   This care required high  level of medical decision making.  09/13/2024       Subjective: (Chief complaint)  The patient is a 69 year old white female who presents with abdominal pain for the last 2 weeks. The pain is located in her left lower quadrant. She describes the pain is mild to moderate. It seems to come and go at times. She denies any fevers or chills at home. She does report passing some air in the urine but no particulate matter. A CT scan was consistent with a sigmoid diverticulitis and the radiologist raise the question of a possible colovesical fistula.   Patient stabilized left the hospital.  She since had a colonoscopy with no evidence of malignancy.  To go to get pulmonary clearance but she is somewhat optimized.  Urology on board given colovesical fistula.  Patient ready to proceed with surgery   Objective:   Vital signs:         Vitals:    09/12/24 2009 09/12/24 2207 09/13/24 0658 09/13/24 0755  BP: 124/68   139/82    Pulse: 95   93    Resp: 18   18    Temp: 98.3 F  (36.8 C)   98.9 F (37.2 C)    TempSrc:          SpO2: 93% 96% 99% 99%  Weight:          Height:              Last BM Date : 09/13/24   Intake/Output    Yesterday:              11/27 0701 - 11/28 0700 In: 10 [I.V.:10] Out: -  This shift:              No intake/output data recorded.   Bowel function:              Flatus: YES              BM:  YES  Drain: (No drain)     Physical Exam:   General: Pt awake/alert in no acute distress Eyes: PERRL, normal EOM.  Sclera clear.  No icterus Neuro: CN II-XII intact w/o focal sensory/motor deficits. Lymph: No head/neck/groin lymphadenopathy Psych: Mildly anxious but mostly consolable no delerium/psychosis/paranoia.  Oriented x 4 HENT: Normocephalic, Mucus membranes moist.  No thrush Neck: Supple, No tracheal deviation.  No obvious thyromegaly Chest: No pain to chest wall compression.  Good respiratory excursion.  No audible wheezing CV:  Pulses intact.  Regular rhythm.  No major extremity edema MS: Normal AROM mjr joints.  No obvious deformity   Abdomen: Soft.  Nondistended.  Tenderness at suprapubic region mild.  No guarding..  No evidence of peritonitis.  No incarcerated hernias.   Ext:  No deformity.  No mjr edema.  No cyanosis Skin: No petechiae / purpurea.  No major sores.  Warm and dry       Results:    Cultures:        Recent Results (from the past 720 hours)  Urine Culture     Status: Abnormal    Collection Time: 09/02/24 11:03 AM    Specimen: Urine, Random  Result Value Ref Range Status    Specimen Description     Final      URINE, RANDOM Performed at [facility], [address], [place], [place] [address]      Special Requests     Final      NONE Reflexed from [id number] Performed at [facility], [address], [place], [place] [address]      Culture MULTIPLE SPECIES PRESENT, SUGGEST RECOLLECTION (A)   Final    Report Status 09/03/2024 FINAL   Final       Labs: Lab Results Last 48 Hours        Results for orders placed or performed during the hospital encounter of 09/02/24 (from the past 48 hours)  CBC with Differential/Platelet     Status: Abnormal    Collection Time: 09/12/24  4:33 AM  Result Value Ref Range    WBC 9.0 4.0 - 10.5 K/uL    RBC 3.41 (L) 3.87 - 5.11 MIL/uL    Hemoglobin 9.9 (L) 12.0 - 15.0 g/dL    HCT 68.6 (L) 63.9 - 46.0 %    MCV 91.8 80.0 - 100.0 fL    MCH 29.0 26.0 - 34.0 pg    MCHC 31.6 30.0 - 36.0 g/dL    RDW 86.1 88.4 - 84.4 %    Platelets 468 (H) 150 - 400 K/uL    nRBC 0.0 0.0 - 0.2 %    Neutrophils Relative % 58 %    Neutro Abs 5.3 1.7 - 7.7 K/uL    Lymphocytes Relative 29 %    Lymphs Abs 2.6 0.7 - 4.0 K/uL    Monocytes Relative 8 %    Monocytes Absolute 0.7 0.1 - 1.0 K/uL    Eosinophils Relative 4 %    Eosinophils Absolute 0.3 0.0 - 0.5 K/uL    Basophils Relative 1 %    Basophils Absolute 0.1 0.0 - 0.1 K/uL    Immature Granulocytes 0 %    Abs Immature Granulocytes 0.02 0.00 - 0.07 K/uL      Comment: Performed at [facility], [address], [place], [place] [address]        Imaging / Studies: Imaging Results (Last 48 hours)  No results found.     Medications /  Allergies: per chart   Antibiotics: Anti-infectives (From admission, onward)        Start     Dose/Rate Route Frequency Ordered Stop    09/03/24 1600   cefTRIAXone  (ROCEPHIN ) 2 g in sodium chloride  0.9 % 100 mL IVPB        2 g 200 mL/hr over 30 Minutes Intravenous Every 24 hours 09/02/24 1944      09/03/24 0800   metroNIDAZOLE  (FLAGYL ) IVPB 500 mg        500 mg 100 mL/hr over 60 Minutes Intravenous Every 12 hours 09/02/24 1944      09/02/24 1715   metroNIDAZOLE  (FLAGYL ) IVPB 500 mg        500 mg 100 mL/hr over 60 Minutes Intravenous  Once 09/02/24 1710 09/02/24 1950    09/02/24 1530   cefTRIAXone  (ROCEPHIN ) 1 g in sodium chloride  0.9 % 100 mL IVPB        1 g 200 mL/hr over 30 Minutes Intravenous  Once 09/02/24  1529 09/02/24 1659               Elspeth KYM Schultze, MD, FACS, MASCRS Esophageal, Gastrointestinal & Colorectal Surgery Robotic and Minimally Invasive Surgery  [facility] [address] [place], [place] [address] [phone] Fax [phone] Main  CONTACT INFORMATION: Weekday (9AM-5PM): Call [facility] main office at [phone] Weeknight (5PM-9AM) or Weekend/Holiday: Check EPIC Web Links tab & use AMION (password  [email or web address]) for General Surgery [facility] coverage  Please, DO NOT use SecureChat  (it is not reliable communication to reach operating surgeons & will lead to a delay in care).   Epic staff messaging available for outpatient concerns needing 1-2 business day response.     11/20/2024

## 2024-11-20 NOTE — Anesthesia Procedure Notes (Signed)
 Procedure Name: Intubation Date/Time: 11/20/2024 7:34 PM  Performed by: [name], MDPre-anesthesia Checklist: Patient identified, Emergency Drugs available, Suction available and Patient being monitored Patient Re-evaluated:Patient Re-evaluated prior to induction Oxygen  Delivery Method: Ambu bag Preoxygenation: Pre-oxygenation with 100% oxygen  Induction Type: IV induction Ventilation: Mask ventilation without difficulty Laryngoscope Size: Mac and 3 Grade View: Grade I Tube type: Oral Tube size: 7.0 mm Number of attempts: 1 Airway Equipment and Method: Stylet Placement Confirmation: ETT inserted through vocal cords under direct vision, breath sounds checked- equal and bilateral and CO2 detector Secured at: 21 cm Tube secured with: Tape Dental Injury: Teeth and Oropharynx as per pre-operative assessment

## 2024-11-21 ENCOUNTER — Encounter (HOSPITAL_COMMUNITY): Payer: Self-pay | Admitting: Surgery

## 2024-11-21 LAB — BASIC METABOLIC PANEL WITH GFR
Anion gap: 9 (ref 5–15)
BUN: 8 mg/dL (ref 8–23)
CO2: 27 mmol/L (ref 22–32)
Calcium: 8 mg/dL — ABNORMAL LOW (ref 8.9–10.3)
Chloride: 102 mmol/L (ref 98–111)
Creatinine, Ser: 0.48 mg/dL (ref 0.44–1.00)
GFR, Estimated: >60 mL/min
Glucose, Bld: 127 mg/dL — ABNORMAL HIGH (ref 70–99)
Potassium: 4.8 mmol/L (ref 3.5–5.1)
Sodium: 138 mmol/L (ref 135–145)

## 2024-11-21 LAB — CBC
HCT: 25.9 % — ABNORMAL LOW (ref 36.0–46.0)
Hemoglobin: 8.1 g/dL — ABNORMAL LOW (ref 12.0–15.0)
MCH: 28.6 pg (ref 26.0–34.0)
MCHC: 31.3 g/dL (ref 30.0–36.0)
MCV: 91.5 fL (ref 80.0–100.0)
Platelets: 352 10*3/uL (ref 150–400)
RBC: 2.83 MIL/uL — ABNORMAL LOW (ref 3.87–5.11)
RDW: 14 % (ref 11.5–15.5)
WBC: 15.7 10*3/uL — ABNORMAL HIGH (ref 4.0–10.5)
nRBC: 0 % (ref 0.0–0.2)

## 2024-11-21 LAB — GLUCOSE, CAPILLARY
Glucose-Capillary: 132 mg/dL — ABNORMAL HIGH (ref 70–99)
Glucose-Capillary: 114 mg/dL — ABNORMAL HIGH (ref 70–99)
Glucose-Capillary: 113 mg/dL — ABNORMAL HIGH (ref 70–99)
Glucose-Capillary: 89 mg/dL (ref 70–99)
Glucose-Capillary: 110 mg/dL — ABNORMAL HIGH (ref 70–99)

## 2024-11-21 LAB — POTASSIUM: Potassium: 4.7 mmol/L (ref 3.5–5.1)

## 2024-11-21 LAB — CREATININE, SERUM
Creatinine, Ser: 0.48 mg/dL (ref 0.44–1.00)
GFR, Estimated: >60 mL/min

## 2024-11-21 LAB — MAGNESIUM: Magnesium: 1.6 mg/dL — ABNORMAL LOW (ref 1.7–2.4)

## 2024-11-21 MED ORDER — PROCHLORPERAZINE EDISYLATE 10 MG/2ML IJ SOLN: 5.0000 mg | Freq: Four times a day (QID) | INTRAMUSCULAR | Status: DC | PRN

## 2024-11-21 MED ORDER — ONDANSETRON HCL 4 MG PO TABS: 4.0000 mg | ORAL_TABLET | Freq: Four times a day (QID) | ORAL | Status: AC | PRN

## 2024-11-21 MED ORDER — CALCIUM POLYCARBOPHIL 625 MG PO TABS
625.0000 mg | ORAL_TABLET | Freq: Two times a day (BID) | ORAL | Status: DC
  Administered 2024-11-21: 625 mg
  Filled 2024-11-21 (×2): qty 1

## 2024-11-21 MED ORDER — DIPHENHYDRAMINE HCL 12.5 MG/5ML PO ELIX: 12.5000 mg | ORAL_SOLUTION | Freq: Four times a day (QID) | ORAL | Status: DC | PRN

## 2024-11-21 MED ORDER — SIMETHICONE 80 MG PO CHEW: 40.0000 mg | CHEWABLE_TABLET | Freq: Four times a day (QID) | ORAL | Status: AC | PRN

## 2024-11-21 MED ORDER — IRON SUCROSE 200 MG IVPB - SIMPLE MED
200.0000 mg | Freq: Once | Status: AC
  Administered 2024-11-21: 200 mg via INTRAVENOUS
  Filled 2024-11-21: qty 200

## 2024-11-21 MED ORDER — POLYETHYLENE GLYCOL 3350 17 G PO PACK: 17.0000 g | PACK | Freq: Every day | ORAL | Status: DC

## 2024-11-21 MED ORDER — CALCIUM POLYCARBOPHIL 625 MG PO TABS
625.0000 mg | ORAL_TABLET | Freq: Two times a day (BID) | ORAL | Status: AC
  Administered 2024-11-22 (×2): 625 mg via ORAL
  Filled 2024-11-21 (×3): qty 1

## 2024-11-21 MED ORDER — ONDANSETRON HCL 4 MG PO TABS: 4.0000 mg | ORAL_TABLET | Freq: Four times a day (QID) | ORAL | Status: DC | PRN

## 2024-11-21 MED ORDER — SIMETHICONE 80 MG PO CHEW: 40.0000 mg | CHEWABLE_TABLET | Freq: Four times a day (QID) | ORAL | Status: DC | PRN

## 2024-11-21 MED ORDER — LACTATED RINGERS IV BOLUS
500.0000 mL | Freq: Once | INTRAVENOUS | Status: AC
  Administered 2024-11-21: 500 mL via INTRAVENOUS

## 2024-11-21 MED ORDER — SENNA 8.6 MG PO TABS: 1.0000 | ORAL_TABLET | Freq: Two times a day (BID) | ORAL | Status: DC

## 2024-11-21 MED ORDER — PROCHLORPERAZINE MALEATE 10 MG PO TABS: 10.0000 mg | ORAL_TABLET | Freq: Four times a day (QID) | ORAL | Status: DC | PRN

## 2024-11-21 MED ORDER — PROCHLORPERAZINE MALEATE 10 MG PO TABS: 10.0000 mg | ORAL_TABLET | Freq: Four times a day (QID) | ORAL | Status: AC | PRN

## 2024-11-21 MED ORDER — MAGNESIUM SULFATE 4 GM/100ML IV SOLN
4.0000 g | Freq: Once | INTRAVENOUS | Status: AC
  Administered 2024-11-21: 4 g via INTRAVENOUS
  Filled 2024-11-21: qty 100

## 2024-11-21 MED ORDER — VITAMIN D 25 MCG (1000 UNIT) PO TABS
1000.0000 [IU] | ORAL_TABLET | Freq: Every day | ORAL | Status: DC
  Administered 2024-11-21: 1000 [IU]
  Filled 2024-11-21: qty 1

## 2024-11-21 MED ORDER — MELATONIN 3 MG PO TABS: 3.0000 mg | ORAL_TABLET | Freq: Every evening | ORAL | Status: AC | PRN

## 2024-11-21 MED ORDER — ONDANSETRON HCL 4 MG/2ML IJ SOLN: 4.0000 mg | Freq: Four times a day (QID) | INTRAMUSCULAR | Status: DC | PRN

## 2024-11-21 MED ORDER — ACETAMINOPHEN 500 MG PO TABS
1000.0000 mg | ORAL_TABLET | Freq: Four times a day (QID) | ORAL | Status: AC
  Administered 2024-11-21 – 2024-11-22 (×5): 1000 mg via ORAL
  Filled 2024-11-21 (×6): qty 2

## 2024-11-21 MED ORDER — ONDANSETRON HCL 4 MG/2ML IJ SOLN: 4.0000 mg | Freq: Four times a day (QID) | INTRAMUSCULAR | Status: AC | PRN

## 2024-11-21 MED ORDER — DIPHENHYDRAMINE HCL 50 MG/ML IJ SOLN: 12.5000 mg | Freq: Four times a day (QID) | INTRAMUSCULAR | Status: DC | PRN

## 2024-11-21 MED ORDER — PROCHLORPERAZINE EDISYLATE 10 MG/2ML IJ SOLN: 5.0000 mg | Freq: Four times a day (QID) | INTRAMUSCULAR | Status: AC | PRN

## 2024-11-21 MED ORDER — TRAMADOL HCL 50 MG PO TABS: 50.0000 mg | ORAL_TABLET | Freq: Four times a day (QID) | ORAL | Status: DC | PRN

## 2024-11-21 MED ORDER — TRAMADOL HCL 50 MG PO TABS
50.0000 mg | ORAL_TABLET | Freq: Four times a day (QID) | ORAL | Status: AC | PRN
  Administered 2024-11-22 (×2): 50 mg via ORAL
  Filled 2024-11-21 (×2): qty 1

## 2024-11-21 MED ORDER — MELATONIN 3 MG PO TABS: 3.0000 mg | ORAL_TABLET | Freq: Every evening | ORAL | Status: DC | PRN

## 2024-11-21 MED ORDER — ALBUMIN HUMAN 5 % IV SOLN
25.0000 g | Freq: Once | INTRAVENOUS | Status: AC
  Administered 2024-11-21: 25 g via INTRAVENOUS
  Filled 2024-11-21: qty 500

## 2024-11-21 MED ORDER — ACETAMINOPHEN 500 MG PO TABS
1000.0000 mg | ORAL_TABLET | Freq: Four times a day (QID) | ORAL | Status: DC
  Administered 2024-11-21: 1000 mg
  Filled 2024-11-21: qty 2

## 2024-11-21 MED ORDER — GABAPENTIN 250 MG/5ML PO SOLN
200.0000 mg | Freq: Every day | ORAL | Status: DC
  Filled 2024-11-21: qty 4

## 2024-11-21 MED ORDER — METHOCARBAMOL 500 MG PO TABS
1000.0000 mg | ORAL_TABLET | Freq: Four times a day (QID) | ORAL | Status: AC | PRN
  Administered 2024-11-22: 1000 mg via ORAL
  Filled 2024-11-21: qty 2

## 2024-11-21 MED ORDER — METHOCARBAMOL 500 MG PO TABS: 1000.0000 mg | ORAL_TABLET | Freq: Four times a day (QID) | ORAL | Status: DC | PRN

## 2024-11-21 MED ORDER — VITAMIN D 25 MCG (1000 UNIT) PO TABS
1000.0000 [IU] | ORAL_TABLET | Freq: Every day | ORAL | Status: AC
  Administered 2024-11-22: 1000 [IU] via ORAL
  Filled 2024-11-21: qty 1

## 2024-11-21 NOTE — Procedures (Signed)
 Extubation Procedure Note  Patient Details:   Name: Lynn Kline DOB: [birth year] MRN: [id number]   Airway Documentation:    Vent end date: 11/21/24 Vent end time: 1040   Evaluation  O2 sats: stable throughout Complications: No apparent complications Patient did tolerate procedure well. Bilateral Breath Sounds: Diminished, Clear   Yes  Pt extubated to Bipap per MD order.  HR 100, RR 15, 98%  Joesph DELENA Maffucci 11/21/2024, 10:45 AM

## 2024-11-21 NOTE — Progress Notes (Signed)
 11/21/2024  Lynn Kline [id number] 1956/01/22  CARE TEAM: PCP: [name], MD  Outpatient Care Team: Lynn Kline Care Team: [name], MD as PCP - General (Internal Medicine) [name], [name], [facility] (Inactive) as Pharmacist (Pharmacist) [name], DO as Consulting Physician (Pulmonary Disease) [name], MD as Consulting Physician (Gastroenterology) [name], MD as Consulting Physician (General Surgery) [name], MD as Consulting Physician (Urology)  Inpatient Treatment Team: Treatment Team:  [name], MD [name], RN [name], MD Pccm, Md, MD [name], RN [name], RN [name], OT [name], PT   Problem List:   Principal Problem:   Colovesical fistula Active Problems:   Acute hypoxic on chronic hypercapnic respiratory failure (HCC)   Acute on chronic hypoxic respiratory failure (HCC)   Shock (HCC)   11/20/2024  Procedures: COLECTOMY, SIGMOID, ROBOT-ASSISTED CREATION, COLOSTOMY, DIVERTING, LAPAROSCOPIC SIGMOIDOSCOPY, FLEXIBLE CYSTOSCOPY WITH INDOCYANINE GREEN IMAGING (ICG)    Assessment [facility] Stay = 1 days) 1 Day Post-Op     GUARDED        Plan: CRITICAL CARE/PULMONARY/ICU HELP APPRECIATED - VENT SUPPORT - WORK TO WEANING/EXTUBATION - keep on the dry side as possible - IDA with ABLA anemia - IV Iron  infusion.  Transfusif Hgb <7 or low BP despite IVF/Norepi - continue foley catheter until Lynn Kline is extubated - Continue restraints until extubated - f/u pathology  -monitor electrolytes & replace as needed  Keep K>4, Mg>2, Phos>3 -VTE prophylaxis- SCDs.  Anticoagulation prophyllaxis SQ as appropriate - hold enoxaparin  if Hgb <7 -mobilize as tolerated to help recovery.  Enlist therapies in moderate/high risk patients as appropriate  I updated the Lynn Kline's status to the Lynn Kline  Recommendations were made. The patients sister was updated per her RN  [name]  -Disposition:  Disposition:  The Lynn Kline is from: Home Anticipate discharge to:  Home with Home Health vs SNF Anticipated Date of Discharge is: ??? Barriers to discharge:  Pending Clinical improvement (more likely than not)  Lynn Kline currently is NOT MEDICALLY STABLE for discharge from the hospital from a surgery standpoint.      I reviewed nursing notes, ED provider notes, last 24 h vitals and pain scores, last 48 h intake and output, last 24 h labs and trends, and last 24 h imaging results.  I have reviewed this Lynn Kline's available data, including medical history, events of note, test results, etc as part of my evaluation.   A significant portion of that time was spent in counseling. Care during the described time interval was provided by me.  This care required moderate level of medical decision making.  11/21/2024    Subjective: (Chief complaint)  Lynn Kline was extubated post operatively.  She became more somnulent ABG w CO2 high = re intubated.  Hypotensive with sedation - on low dose Norepinephring.   ICU RN in room She is awake, restraints are on.  Objective:  Vital signs:  Vitals:   11/21/24 0430 11/21/24 0500 11/21/24 0515 11/21/24 0600  BP: 106/65 95/64 129/70 (!) 85/60  Pulse:  88  90  Resp:  18  18  Temp:      TempSrc:      SpO2:  100%  100%  Weight:      Height:        Last BM Date :  (PTA)  Intake/Output   Yesterday:  02/04 0701 - 02/05 0700 In: 4379.8 [I.V.:2215.4; NG/GT:40; IV Piggyback:2124.4] Out: 1210 [Urine:670; Drains:490; Blood:50] This  shift:  Total I/O In: 2579.8 [I.V.:415.4; NG/GT:40; IV Piggyback:2124.4] Out: 480 [Urine:220; Drains:260]  Bowel function:  Flatus: No  BM:  No  Drain: Serosanguinous   Physical Exam:  General: Pt awake/alert in no acute distress.  Follows commands Eyes: PERRL, normal EOM.  Sclera clear.  No icterus Neuro: CN II-XII intact w/o focal sensory/motor deficits. Lymph: No head/neck/groin  lymphadenopathy Psych:  No delerium/psychosis/paranoia.  Lynn Kline is sedated HENT: ETT in place Normocephalic, Mucus membranes moist.  No thrush Neck: Supple, No tracheal deviation.  No obvious thyromegaly Chest: No pain to chest wall compression.  Good respiratory excursion.  No audible wheezing CV:  Pulses intact.  Regular rhythm.  No major extremity edema MS: Normal AROM mjr joints.  No obvious deformity  Abdomen: Soft.  Nondistended. Minimal TTP at incisions .  No evidence of peritonitis.  No incarcerated hernias. Old serosanguinous blood at drain site  Ext:  No deformity.  No mjr edema.  No cyanosis Skin: No petechiae / purpurea.  No major sores.  Warm and dry    Results:   Cultures: Recent Results (from the past 720 hours)  MRSA Next Gen by PCR, Nasal     Status: None   Collection Time: 11/20/24  9:36 PM   Specimen: Nasal Mucosa; Nasal Swab  Result Value Ref Range Status   MRSA by PCR Next Gen NOT DETECTED NOT DETECTED Final    Comment: (NOTE) The GeneXpert MRSA Assay (FDA approved for NASAL specimens only), is one component of a comprehensive MRSA colonization surveillance program. It is not intended to diagnose MRSA infection nor to guide or monitor treatment for MRSA infections. Test performance is not FDA approved in patients less than 70 years old. Performed at [facility], [address] [place], [place] [address]     Labs: Results for orders placed or performed during the hospital encounter of 11/20/24 (from the past 48 hours)  ABO/Rh     Status: None   Collection Time: 11/20/24 10:45 AM  Result Value Ref Range   ABO/RH(D)      MALVA NEG Performed at [facility], [address] [place], [place] [address]   Blood gas, arterial     Status: Abnormal   Collection Time: 11/20/24  6:45 PM  Result Value Ref Range   O2 Content 3.0 L/min   Delivery systems NASAL CANNULA    pH, Arterial 7.2 (L) 7.35 - 7.45   pCO2 arterial 84  (HH) 32 - 48 mmHg    Comment: CRITICAL RESULT CALLED TO, READ BACK BY AND VERIFIED WITH: YU, H RN BY SANDOVAL, K 11/20/24 @1912     pO2, Arterial 111 (H) 83 - 108 mmHg   Bicarbonate 33.2 (H) 20.0 - 28.0 mmol/L   Acid-Base Excess 3.2 (H) 0.0 - 2.0 mmol/L   O2 Saturation 99.7 %   Lynn Kline temperature 36.3    Collection site RIGHT BRACHIAL     Comment: Performed at [facility], [address], [place], [place] [address]  Glucose, capillary     Status: Abnormal   Collection Time: 11/20/24  8:44 PM  Result Value Ref Range   Glucose-Capillary 131 (H) 70 - 99 mg/dL    Comment: Glucose reference range applies only to samples taken after fasting for at least 8 hours.  Blood gas, arterial     Status: Abnormal   Collection Time: 11/20/24  9:32 PM  Result Value Ref Range   O2 Content 100.0 L/min   Delivery systems  VENTILATOR    Mode PRESSURE REGULATED VOLUME CONTROL    MECHVT 400 mL   RATE 18 resp/min   PEEP 5 cm H20   pH, Arterial 7.21 (L) 7.35 - 7.45   pCO2 arterial 71 (HH) 32 - 48 mmHg    Comment: CRITICAL RESULT CALLED TO, READ BACK BY AND VERIFIED WITH: ELLIE HERO RN @ 2144 ON 11/20/2024 BY MTA    pO2, Arterial 83 83 - 108 mmHg   Bicarbonate 28.5 (H) 20.0 - 28.0 mmol/L   Acid-base deficit 0.5 0.0 - 2.0 mmol/L   O2 Saturation 98.6 %   Lynn Kline temperature 36.3    Collection site RIGHT BRACHIAL    Drawn by [id number]    Allens test (pass/fail) PASS PASS    Comment: Performed at [facility], [address], [place], [place] [address]  MRSA Next Gen by PCR, Nasal     Status: None   Collection Time: 11/20/24  9:36 PM   Specimen: Nasal Mucosa; Nasal Swab  Result Value Ref Range   MRSA by PCR Next Gen NOT DETECTED NOT DETECTED    Comment: (NOTE) The GeneXpert MRSA Assay (FDA approved for NASAL specimens only), is one component of a comprehensive MRSA colonization surveillance program. It is not intended to diagnose MRSA infection nor to guide or monitor  treatment for MRSA infections. Test performance is not FDA approved in patients less than 64 years old. Performed at [facility], [address] [place], [place] [address]   Hemoglobin and hematocrit, blood     Status: Abnormal   Collection Time: 11/20/24 10:07 PM  Result Value Ref Range   Hemoglobin 8.9 (L) 12.0 - 15.0 g/dL   HCT 70.5 (L) 63.9 - 53.9 %    Comment: Performed at [facility], [address] [place], [place] [address]  Glucose, capillary     Status: Abnormal   Collection Time: 11/20/24 11:34 PM  Result Value Ref Range   Glucose-Capillary 118 (H) 70 - 99 mg/dL    Comment: Glucose reference range applies only to samples taken after fasting for at least 8 hours.  Magnesium      Status: Abnormal   Collection Time: 11/21/24  3:19 AM  Result Value Ref Range   Magnesium  1.6 (L) 1.7 - 2.4 mg/dL    Comment: Performed at [facility], [address] [place], [place] [address]  CBC     Status: Abnormal   Collection Time: 11/21/24  3:19 AM  Result Value Ref Range   WBC 15.7 (H) 4.0 - 10.5 K/uL   RBC 2.83 (L) 3.87 - 5.11 MIL/uL   Hemoglobin 8.1 (L) 12.0 - 15.0 g/dL   HCT 74.0 (L) 63.9 - 53.9 %   MCV 91.5 80.0 - 100.0 fL   MCH 28.6 26.0 - 34.0 pg   MCHC 31.3 30.0 - 36.0 g/dL   RDW 85.9 88.4 - 84.4 %   Platelets 352 150 - 400 K/uL   nRBC 0.0 0.0 - 0.2 %    Comment: Performed at [facility], [address] [place], [place] [address]  Potassium     Status: None   Collection Time: 11/21/24  3:19 AM  Result Value Ref Range   Potassium 4.7 3.5 - 5.1 mmol/L    Comment: Performed at [facility], [address], [place], [place] [address]  Creatinine, serum     Status: None   Collection Time: 11/21/24  3:19 AM  Result  Value Ref Range   Creatinine, Ser 0.48 0.44 - 1.00 mg/dL   GFR, Estimated >39 >39 mL/min    Comment: (NOTE) Calculated using the CKD-EPI Creatinine Equation  (2021) Performed at [facility], [address], [place], [place] [address]   Glucose, capillary     Status: Abnormal   Collection Time: 11/21/24  4:37 AM  Result Value Ref Range   Glucose-Capillary 132 (H) 70 - 99 mg/dL    Comment: Glucose reference range applies only to samples taken after fasting for at least 8 hours.    Imaging / Studies: DG CHEST PORT 1 VIEW Result Date: 11/20/2024 EXAM: 1 VIEW(S) XRAY OF THE CHEST 11/20/2024 10:22:00 PM COMPARISON: 04/16/2024 CLINICAL HISTORY: Visit for intubation. FINDINGS: LINES, TUBES AND DEVICES: Endotracheal tube projects over the intrathoracic trachea 3.6 cm from the carina. Enteric tube in place with tip below the diaphragm. LUNGS AND PLEURA: Surgical clips overlying the LEFT upper lobe. Emphysema. Biapical scarring. No pleural effusion. No pneumothorax. HEART AND MEDIASTINUM: No acute abnormality of the cardiac and mediastinal silhouettes. BONES AND SOFT TISSUES: Soft tissue emphysema along the RIGHT lateral chest wall. Thoracic spondylosis. IMPRESSION: 1. Endotracheal tube tip 3.6 cm above the carina. 2. Soft tissue emphysema along the right lateral chest wall. Electronically signed by: Norman Gatlin MD 11/20/2024 10:26 PM EST RP Workstation: [id number]   DG Abd 1 View Result Date: 11/20/2024 EXAM: 1 VIEW XRAY OF THE ABDOMEN 11/20/2024 10:20:00 PM COMPARISON: None available. CLINICAL HISTORY: Encounter for imaging study to confirm orogastric (OG) tube placement. ICD10: Z01.89 Encounter for other specified special examinations. FINDINGS: LINES, TUBES AND DEVICES: NG tube in place with distal tip and side port below the diaphragm overlying the stomach. Tip is oriented medially towards the gastroesophageal junction. Tubing overlying the lower abdomen. BOWEL: Gaseous distension of the transverse colon. Nonobstructive bowel gas pattern. SOFT TISSUES: Cholecystectomy clips noted. BONES: No acute fracture. IMPRESSION: 1. NG tube tip and  side port below the diaphragm within the stomach, with the tip oriented medially toward the gastroesophageal junction. Electronically signed by: Norman Gatlin MD 11/20/2024 10:24 PM EST RP Workstation: [id number]    Medications / Allergies: per chart  Antibiotics: Anti-infectives (From admission, onward)    Start     Dose/Rate Route Frequency Ordered Stop   11/20/24 1400  neomycin  (MYCIFRADIN ) tablet 1,000 mg  Status:  Discontinued       Placed in And Linked Group   1,000 mg Oral 3 times per day 11/20/24 0957 11/20/24 1001   11/20/24 1400  metroNIDAZOLE  (FLAGYL ) tablet 1,000 mg  Status:  Discontinued       Placed in And Linked Group   1,000 mg Oral 3 times per day 11/20/24 0957 11/20/24 1001   11/20/24 1000  ertapenem  (INVANZ ) 1 g in sodium chloride  0.9 % 100 mL IVPB        1 g 200 mL/hr over 30 Minutes Intravenous On call to O.R. 11/20/24 0957 11/20/24 2051         Note: Portions of this report may have been transcribed using voice recognition software. Every effort was made to ensure accuracy; however, inadvertent computerized transcription errors may be present.   Any transcriptional errors that result from this process are unintentional.   Lauraine Ronnald Nick  PA- Student  11/21/2024  6:57 AM

## 2024-11-21 NOTE — Consult Note (Signed)
" ° °  CLINICAL SUPPORT TEAM - WOUND OSTOMY AND CONTINENCE TEAM  CONSULTATION SERVICES   WOC Nurse-Inpatient Note  WOC Nurse follow-up Consult Note: Pt went to the OR yesterday and did not receive an ostomy.  Surgical team following for assessment and plan of care. Please refer to their team for further questions.   Please re-consult if further assistance is needed.  Thank-you,  Stephane Fought MSN, RN, CWOCN, CWCN-AP, CNS Contact Mon-Fri 0700-1500: [phone]  Reason for Consult: "

## 2024-11-21 NOTE — Progress Notes (Signed)
 PT Cancellation Note  Patient Details Name: Lynn Kline MRN: [id number] DOB: 05-04-1956   Cancelled Treatment:    Reason Eval/Treat Not Completed: Medical issues which prohibited therapy, patient currently on ventilator. Darice Potters PT Acute Rehabilitation Services Office [phone]    Potters Darice Norris 11/21/2024, 7:52 AM

## 2024-11-21 NOTE — Progress Notes (Signed)
 [facility] ADULT ICU REPLACEMENT PROTOCOL   The patient does apply for the [facility] Adult ICU Electrolyte Replacment Protocol based on the criteria listed below:   1.Exclusion criteria: TCTS, ECMO, Dialysis, and Myasthenia Gravis patients 2. Is GFR >/= 30 ml/min? Yes.    Patient's GFR today is >60 3. Is SCr </= 2? Yes.   Patient's SCr is 0.48 mg/dL 4. Did SCr increase >/= 0.5 in 24 hours? No. 5.Pt's weight >40kg  Yes.   6. Abnormal electrolyte(s): Mag 1.6  7. Electrolytes replaced per protocol 8.  Call MD STAT for K+ </= 2.5, Phos </= 1, or Mag </= 1 Physician:  [name], [name] A 11/21/2024 6:23 AM

## 2024-11-21 NOTE — Progress Notes (Addendum)
 "  NAME:  Lynn Kline, MRN:  [id number], DOB:  1956/06/20, LOS: 1 ADMISSION DATE:  11/20/2024, CONSULTATION DATE:  11/20/24 REFERRING MD:  anesthesia CHIEF COMPLAINT:  acute resp failure   History of Present Illness:  69 yo presented for colectomy and take down of colovesical fistula. Completed procedure but post operatively in OR pt required narcan  for extubation. She remained somnolent but arousable. Pt was transferred to PACU and remained altered, Abg revealed co2 of >80 and pt was reintubated. Baseline pt has COPD on 3L [place] at home. She was at high risk for prolonged intubation pre-operatively and pulmonary aware prior to surgery. With her reintubation CCM was asked to transfer to ICU and manage vent. BP remained stable with exception of at induction when she required neo push. Currently, no infusion required.   Pt was intubated prior to my assessment so all history obtained from chart and no ROS was able to be obtained.   Pertinent  Medical History   Chronic hypoxic resp failure 3L baseline copd Diverticulosis with colovesical fistula Prediabetes GAD Hyperlipidemia IBS   Significant Hospital Events: Including procedures, antibiotic start and stop dates in addition to other pertinent events   2/4 Admitted to hospital for surgery 2/4,  post operatively reintubated 2/4 Transferred to ICU 2/4 and ccm consulted 2/5 Remained intubated overnight, currently following commands, on limited sedation   Interim History / Subjective:  Following commands on command  On 70% Fio2, PEEP 8   Objective    Blood pressure 106/65, pulse 87, temperature 100.3 F (37.9 C), temperature source Axillary, resp. rate 18, height 5' 2 (1.575 m), weight 57 kg, SpO2 100%.    Vent Mode: PRVC FiO2 (%):  [70 %-100 %] 70 % Set Rate:  [18 bmp] 18 bmp Vt Set:  [400 mL] 400 mL PEEP:  [5 cmH20-8 cmH20] 8 cmH20 Plateau Pressure:  [14 cmH20] 14 cmH20   Intake/Output Summary (Last 24 hours) at 11/21/2024 0758 Last  data filed at 11/21/2024 9263 Gross per 24 hour  Intake 4516.53 ml  Output 1210 ml  Net 3306.53 ml   Filed Weights   11/20/24 1011 11/21/24 0415  Weight: 50.8 kg 57 kg    Examination: General: acute on chronic adult female, lying in icu bed on vent in NAD  HEENT: Normocephalic, PERRLA intact, ETT, OG, Pink MM CV: s1,s2, RRR, no MRG, No JVD  pulm: clear, diminished, no distress on vent- on 70% FiO2, PEEP 8  Abs: bs active, soft, JP drain right mid/lower quadrant- minimal serosangineous output, 3 lap sites stable, honey comb dressing- lower abdomen- serosangineous drainage- stable  Extremities: no edema, no deformity, moves all extremities on command  Skin: no rash  Neuro: Rass 0, responds to painful stimuli, cough gag reflex present  GU: foley intact    Resolved problem list   Assessment and Plan  Colovesical fistula s/p colectomy and anastomosis S/p Colovesical fistula take down and repair IBS Diverticulosis P: CCS following, primary, appreciate assistance and recs Continue Post-OP management per CCS  Continue to monitor JP output, monitor ex-lap sites Continue to wean pressors as tolerated, hopefully able to come of pressors-  Suspect related to sedation needs, becoming hypotensive with propofol  overnight- off sedation  MAP goal > 60  Give 500 cc LR Bolus, to see if can assist in weaning off levophed   Continue multimodal pain regimen- robaxin , tramadol , diluadid prn   Acute metabolic (hypercarbic)and toxic (opioid) encephalopathy P:  Continue to wean sedation as tolerated, limit sedating meds as  possible  Once extubated, will extubate to BIPAP  Initiate delirium precautions   Acute on chronic hypoxic/hypercarbic resp failure (baseline 3L) H/o pulm nodule Copd Tobacco abuse P:  Continue ventilator support and lung protective strategies  Continue LTVV  Wean PEEP and Fio2 requirements to sat goal of >92%  HOB > 30 degrees Plat < 30  Aim for Driving pressures < 15   Intermittent Chest X-ray and ABGS Obtain and follow cultures-blood and tracheal aspirate VAP and PAD protocols in place  Wean sedation as tolerated, SBT and WUA daily    Hypomagnesemia  P:  Replace with 4g of Mag Obtain full BMP to assess renal function and electrolytes   Prediabetes P: Check hgb A1c in AM  Continue SSI, CBG x 4   GAD P: Supportive care    Labs   CBC: Recent Labs  Lab 11/20/24 2207 11/21/24 0319  WBC  --  15.7*  HGB 8.9* 8.1*  HCT 29.4* 25.9*  MCV  --  91.5  PLT  --  352    Basic Metabolic Panel: Recent Labs  Lab 11/21/24 0319  K 4.7  CREATININE 0.48  MG 1.6*   GFR: Estimated Creatinine Clearance: 52.5 mL/min (by C-G formula based on SCr of 0.48 mg/dL). Recent Labs  Lab 11/21/24 0319  WBC 15.7*    Liver Function Tests: No results for input(s): AST, ALT, ALKPHOS, BILITOT, PROT, ALBUMIN  in the last 168 hours. No results for input(s): LIPASE, AMYLASE in the last 168 hours. No results for input(s): AMMONIA in the last 168 hours.  ABG    Component Value Date/Time   PHART 7.21 (L) 11/20/2024 2132   PCO2ART 71 (HH) 11/20/2024 2132   PO2ART 83 11/20/2024 2132   HCO3 28.5 (H) 11/20/2024 2132   ACIDBASEDEF 0.5 11/20/2024 2132   O2SAT 98.6 11/20/2024 2132     Coagulation Profile: No results for input(s): INR, PROTIME in the last 168 hours.  Cardiac Enzymes: No results for input(s): CKTOTAL, CKMB, CKMBINDEX, TROPONINI in the last 168 hours.  HbA1C: Hgb A1c MFr Bld  Date/Time Value Ref Range Status  11/08/2024 10:55 AM 5.5 4.8 - 5.6 % Final    Comment:    (NOTE) Diagnosis of Diabetes The following HbA1c ranges recommended by the American Diabetes Association (ADA) may be used as an aid in the diagnosis of diabetes mellitus.  Hemoglobin             Suggested A1C NGSP%              Diagnosis  <5.7                   Non Diabetic  5.7-6.4                Pre-Diabetic  >6.4                    Diabetic  <7.0                   Glycemic control for                       adults with diabetes.    11/29/2023 03:09 PM 5.8 4.6 - 6.5 % Final    Comment:    Glycemic Control Guidelines for People with Diabetes:Non Diabetic:  <6%Goal of Therapy: <7%Additional Action Suggested:  >8%     CBG: Recent Labs  Lab 11/20/24 2044 11/20/24 2334 11/21/24 0437 11/21/24 0753  GLUCAP 131*  118* 132* 114*    Home Medications  Prior to Admission medications  Medication Sig Start Date End Date Taking? Authorizing Provider  albuterol  (PROVENTIL ) (2.5 MG/3ML) 0.083% nebulizer solution Take 3 mLs (2.5 mg total) by nebulization every 4 (four) hours as needed for wheezing or shortness of breath. 09/16/24  Yes Zeffie Bickert Debby CROME, MD  ALPRAZolam  (XANAX ) 0.25 MG tablet Take 1 tablet (0.25 mg total) by mouth 2 (two) times daily as needed for anxiety. 10/24/24  Yes Ipek Westra Debby CROME, MD  budesonide -glycopyrrolate -formoterol  (BREZTRI  AEROSPHERE) 160-9-4.8 MCG/ACT AERO inhaler Inhale 2 puffs into the lungs in the morning and at bedtime. 04/12/24  Yes Cobb, Comer GAILS, NP  cholecalciferol  (VITAMIN D3) 25 MCG (1000 UNIT) tablet Take 1,000 Units by mouth daily.   Yes [provider]  nitrofurantoin (MACRODANTIN) 100 MG capsule Take 100 mg by mouth at bedtime. 10/11/24  Yes [provider]  OXYGEN  Inhale 3 L/min into the lungs continuous.   Yes [provider]  traMADol  (ULTRAM ) 50 MG tablet Take 1-2 tablets (50-100 mg total) by mouth every 6 (six) hours as needed for moderate pain (pain score 4-6) or severe pain (pain score 7-10). 11/20/24  Yes [name], MD  polycarbophil (FIBERCON) 625 MG tablet Take 1 tablet (625 mg total) by mouth 2 (two) times daily. Patient not taking: Reported on 11/01/2024 09/16/24   Jillian Buttery, MD    Critical care time: The patient is critically ill requires high complexity decision making for assessment and support, frequent evaluation and titration of therapies,  application of advanced monitoring technologies and extensive interpretation of multiple databases.   Critical care time 45 mins. This represents my time independent of the NPs time taking care of the pt. This is excluding procedures.    Total CC: 45 mins  Christian Cornisha Zetino AGACNP-BC   [facility] 11/21/2024, 8:52 AM  Please see [email or web address] for pager details.  From 7A-7P if no response, please call [phone]. After hours, please call ELink [phone].         "

## 2024-11-21 NOTE — Plan of Care (Signed)
" °  Problem: Respiratory: Goal: Respiratory status will improve Outcome: Progressing   Problem: Neurological: Goal: Will regain or maintain usual level of consciousness Outcome: Progressing   Problem: Clinical Measurements: Goal: Ability to maintain clinical measurements within normal limits Outcome: Progressing   Problem: Respiratory: Goal: Will regain and/or maintain adequate ventilation Outcome: Progressing   Problem: Clinical Measurements: Goal: Ability to maintain clinical measurements within normal limits will improve Outcome: Progressing Goal: Respiratory complications will improve Outcome: Progressing   "

## 2024-11-22 ENCOUNTER — Other Ambulatory Visit (HOSPITAL_COMMUNITY): Payer: Self-pay

## 2024-11-22 LAB — TYPE AND SCREEN
ABO/RH(D): O NEG
ABO/RH(D): O NEG
Antibody Screen: NEGATIVE
Antibody Screen: NEGATIVE
Unit division: 0

## 2024-11-22 LAB — GLUCOSE, CAPILLARY
Glucose-Capillary: 129 mg/dL — ABNORMAL HIGH (ref 70–99)
Glucose-Capillary: 141 mg/dL — ABNORMAL HIGH (ref 70–99)
Glucose-Capillary: 80 mg/dL (ref 70–99)
Glucose-Capillary: 84 mg/dL (ref 70–99)
Glucose-Capillary: 89 mg/dL (ref 70–99)
Glucose-Capillary: 95 mg/dL (ref 70–99)
Glucose-Capillary: 95 mg/dL (ref 70–99)

## 2024-11-22 LAB — BPAM RBC
Blood Product Expiration Date: 202603112359
ISSUE DATE / TIME: 202602061238
Unit Type and Rh: 9500

## 2024-11-22 LAB — BASIC METABOLIC PANEL WITH GFR
Anion gap: 5 (ref 5–15)
BUN: 6 mg/dL — ABNORMAL LOW (ref 8–23)
CO2: 35 mmol/L — ABNORMAL HIGH (ref 22–32)
Calcium: 8.1 mg/dL — ABNORMAL LOW (ref 8.9–10.3)
Chloride: 102 mmol/L (ref 98–111)
Creatinine, Ser: 0.43 mg/dL — ABNORMAL LOW (ref 0.44–1.00)
GFR, Estimated: >60 mL/min
Glucose, Bld: 87 mg/dL (ref 70–99)
Potassium: 3.7 mmol/L (ref 3.5–5.1)
Sodium: 142 mmol/L (ref 135–145)

## 2024-11-22 LAB — PREPARE RBC (CROSSMATCH)

## 2024-11-22 LAB — CBC
HCT: 21.3 % — ABNORMAL LOW (ref 36.0–46.0)
Hemoglobin: 6.6 g/dL — CL (ref 12.0–15.0)
MCH: 28.7 pg (ref 26.0–34.0)
MCHC: 31 g/dL (ref 30.0–36.0)
MCV: 92.6 fL (ref 80.0–100.0)
Platelets: 231 10*3/uL (ref 150–400)
RBC: 2.3 MIL/uL — ABNORMAL LOW (ref 3.87–5.11)
RDW: 14.5 % (ref 11.5–15.5)
WBC: 7.1 10*3/uL (ref 4.0–10.5)
nRBC: 0 % (ref 0.0–0.2)

## 2024-11-22 LAB — SURGICAL PATHOLOGY

## 2024-11-22 LAB — HEMOGLOBIN AND HEMATOCRIT, BLOOD
HCT: 25.6 % — ABNORMAL LOW (ref 36.0–46.0)
Hemoglobin: 8.1 g/dL — ABNORMAL LOW (ref 12.0–15.0)

## 2024-11-22 LAB — PHOSPHORUS: Phosphorus: 1.8 mg/dL — ABNORMAL LOW (ref 2.5–4.6)

## 2024-11-22 LAB — MAGNESIUM: Magnesium: 2.2 mg/dL (ref 1.7–2.4)

## 2024-11-22 MED ORDER — SODIUM CHLORIDE 0.9% IV SOLUTION: Freq: Once | INTRAVENOUS | Status: AC

## 2024-11-22 MED ORDER — GABAPENTIN 100 MG PO CAPS
200.0000 mg | ORAL_CAPSULE | Freq: Three times a day (TID) | ORAL | Status: AC
  Administered 2024-11-22 (×3): 200 mg via ORAL
  Filled 2024-11-22 (×3): qty 2

## 2024-11-22 MED ORDER — LOPERAMIDE HCL 2 MG PO CAPS
2.0000 mg | ORAL_CAPSULE | Freq: Two times a day (BID) | ORAL | 0 refills | Status: AC | PRN
  Filled 2024-11-22: qty 20, 10d supply, fill #0

## 2024-11-22 MED ORDER — ORAL CARE MOUTH RINSE: 15.0000 mL | OROMUCOSAL | Status: AC | PRN

## 2024-11-22 MED ORDER — HYDROMORPHONE HCL 1 MG/ML IJ SOLN
INTRAMUSCULAR | Status: AC
  Filled 2024-11-22: qty 2

## 2024-11-22 MED ORDER — FENTANYL CITRATE (PF) 50 MCG/ML IJ SOSY: 50.0000 ug | PREFILLED_SYRINGE | INTRAMUSCULAR | Status: DC | PRN

## 2024-11-22 MED ORDER — ACETAMINOPHEN 10 MG/ML IV SOLN
INTRAVENOUS | Status: AC
  Filled 2024-11-22: qty 100

## 2024-11-22 MED ORDER — BISMUTH SUBSALICYLATE 262 MG/15ML PO SUSP
30.0000 mL | Freq: Once | ORAL | Status: AC
  Administered 2024-11-22: 30 mL via ORAL
  Filled 2024-11-22: qty 236

## 2024-11-22 MED ORDER — CHLORHEXIDINE GLUCONATE CLOTH 2 % EX PADS
6.0000 | MEDICATED_PAD | Freq: Every day | CUTANEOUS | Status: AC
  Administered 2024-11-22: 6 via TOPICAL

## 2024-11-22 MED ORDER — GABAPENTIN 100 MG PO CAPS: 200.0000 mg | ORAL_CAPSULE | Freq: Every day | ORAL | Status: DC

## 2024-11-22 MED ORDER — PANTOPRAZOLE SODIUM 40 MG PO TBEC
40.0000 mg | DELAYED_RELEASE_TABLET | Freq: Every day | ORAL | Status: AC
  Administered 2024-11-22: 40 mg via ORAL
  Filled 2024-11-22: qty 1

## 2024-11-22 MED ORDER — LOPERAMIDE HCL 2 MG PO CAPS: 2.0000 mg | ORAL_CAPSULE | Freq: Two times a day (BID) | ORAL | Status: AC | PRN

## 2024-11-22 MED ORDER — POTASSIUM PHOSPHATES 15 MMOLE/5ML IV SOLN
30.0000 mmol | Freq: Once | INTRAVENOUS | Status: AC
  Administered 2024-11-22: 30 mmol via INTRAVENOUS
  Filled 2024-11-22: qty 30

## 2024-11-22 MED ORDER — LOPERAMIDE HCL 2 MG PO CAPS: 2.0000 mg | ORAL_CAPSULE | Freq: Two times a day (BID) | ORAL | Status: DC | PRN

## 2024-11-22 NOTE — Progress Notes (Signed)
 11/22/2024  Lynn Kline [id number] [birth year]  CARE TEAM: PCP: [name], MD  Outpatient Care Team: Patient Care Team: [name], MD as PCP - General (Internal Medicine) [name], [name], [facility] (Inactive) as Pharmacist (Pharmacist) [name], DO as Consulting Physician (Pulmonary Disease) [name], MD as Consulting Physician (Gastroenterology) [name], MD as Consulting Physician (General Surgery) [name], MD as Consulting Physician (Urology)  Inpatient Treatment Team: Treatment Team:  [name], MD [name], MD Pccm, Md, MD Judythe Lorrayne SAILOR, RN Sharol Sheffield BRAVO, NT Porter Olam HERO, RN Rolette, Delanna PARAS, OT Harl Katrinka SQUIBB, PT Cliff, Harper, NT [place], [place], [facility] [place], Hunter DEL, RN Dasie Gilda NOVAK, RN   Problem List:   Principal Problem:   Colovesical fistula Active Problems:   Acute hypoxic on chronic hypercapnic respiratory failure (HCC)   Acute on chronic hypoxic respiratory failure (HCC)   Shock (HCC)   11/20/2024  Procedures: COLECTOMY, SIGMOID, ROBOT-ASSISTED CREATION, COLOSTOMY, DIVERTING, LAPAROSCOPIC SIGMOIDOSCOPY, FLEXIBLE CYSTOSCOPY WITH INDOCYANINE GREEN IMAGING (ICG)    Assessment [facility] Stay = 2 days) 2 Days Post-Op    Improved  Plan:   - Continue 3L [place] (home regimen) ICU/pulmonary help appreciated  - Shock resolved off pressors continue to follow  - ABLA on top of IDA. Follow HGB. If less than 7 transfuse critical care agrees - Advance to soft diet - Having diarrhea discontinue Miralax /Senacog; - Initiate treatment with fibercon  - Follow up on pathology - If improved consider transfer to floor if critical care agrees - Monitor electrolytes & replace as needed  Keep K>4, Mg>2, Phos>3  -VTE prophylaxis- SCDs.  Anticoagulation prophyllaxis SQ as appropriate  - Mobilize as tolerated to help recovery.  Enlist therapies in moderate/high risk patients as  appropriate  I updated the patient's status to the patient  Recommendations were made.  Questions were answered.  She expressed understanding & appreciation.  -Disposition:  Disposition:  The patient is from: Home Anticipate discharge to:  Home Anticipated Date of Discharge is:  Pending clinical improvement Barriers to discharge:  Pending Clinical improvement (more likely than not)  Patient currently is NOT MEDICALLY STABLE for discharge from the hospital from a surgery standpoint.      I reviewed last 24 h vitals and pain scores, last 48 h intake and output, last 24 h labs and trends, and last 24 h imaging results.  I have reviewed this patient's available data, including medical history, events of note, test results, etc as part of my evaluation.   A significant portion of that time was spent in counseling. Care during the described time interval was provided by me.  This care required moderate level of medical decision making.  11/22/2024    Subjective: (Chief complaint)  Patient was extubated and her pulmonary status is stable with 3L of [place].  Just weaned off norepinephrine   Foley catheter removed, pending trial of void. Tolerating liquids No Nausea or vomiting Patient notes mild soreness Numerous loose bowel movements  Objective:  Vital signs:  Vitals:   11/22/24 0615 11/22/24 0630 11/22/24 0645 11/22/24 0700  BP: 124/61 134/71 126/63 125/64  Pulse: 75 80 81 96  Resp: 14 14 15 18   Temp:      TempSrc:      SpO2: 100% 100% 100% 99%  Weight:      Height:        Last BM Date : 11/21/24  Intake/Output   Yesterday:  02/05 0701 -  02/06 0700 In: 1684.5 [I.V.:374.7; NG/GT:120; IV Piggyback:1189.8] Out: 2318 [Urine:2190; Drains:128] This shift:  Total I/O In: 0.7 [I.V.:0.7] Out: -   Bowel function:  Flatus: Yes  BM:  Yes  Drain: Serous   Physical Exam:  General: Pt awake/alert in no acute distress, smiling, talkative Eyes: PERRL, normal EOM.  Sclera  clear.  No icterus Neuro: CN II-XII intact w/o focal sensory/motor deficits. Lymph: No head/neck/groin lymphadenopathy Psych:  No delerium/psychosis/paranoia.  Oriented x 4 HENT: Normocephalic, Mucus membranes moist.  No thrush Neck: Supple, No tracheal deviation.  No obvious thyromegaly Chest: No pain to chest wall compression.  Good respiratory excursion.  No audible wheezing CV:  Pulses intact.  Regular rhythm.  No major extremity edema MS: Normal AROM mjr joints.  No obvious deformity  Abdomen: Soft.  Nondistended.  Mildly tender at incisions only.  No evidence of peritonitis.  No incarcerated hernias. Dressings removed, incisions clear dry and intact.  Ext:   No deformity.  No mjr edema.  No cyanosis Skin: No petechiae / purpurea.  No major sores.  Warm and dry    Results:   Cultures: Recent Results (from the past 720 hours)  MRSA Next Gen by PCR, Nasal     Status: None   Collection Time: 11/20/24  9:36 PM   Specimen: Nasal Mucosa; Nasal Swab  Result Value Ref Range Status   MRSA by PCR Next Gen NOT DETECTED NOT DETECTED Final    Comment: (NOTE) The GeneXpert MRSA Assay (FDA approved for NASAL specimens only), is one component of a comprehensive MRSA colonization surveillance program. It is not intended to diagnose MRSA infection nor to guide or monitor treatment for MRSA infections. Test performance is not FDA approved in patients less than 69 years old. Performed at [facility], [address], [place], [place] [address]     Labs: Results for orders placed or performed during the hospital encounter of 11/20/24 (from the past 48 hours)  ABO/Rh     Status: None   Collection Time: 11/20/24 10:45 AM  Result Value Ref Range   ABO/RH(D)      MALVA NEG Performed at [facility], [address], [place], [place] [address]   Blood gas, arterial     Status: Abnormal   Collection Time: 11/20/24  6:45 PM  Result Value Ref Range   O2  Content 3.0 L/min   Delivery systems NASAL CANNULA    pH, Arterial 7.2 (L) 7.35 - 7.45   pCO2 arterial 84 (HH) 32 - 48 mmHg    Comment: CRITICAL RESULT CALLED TO, READ BACK BY AND VERIFIED WITH: YU, H RN BY SANDOVAL, K 11/20/24 @1912     pO2, Arterial 111 (H) 83 - 108 mmHg   Bicarbonate 33.2 (H) 20.0 - 28.0 mmol/L   Acid-Base Excess 3.2 (H) 0.0 - 2.0 mmol/L   O2 Saturation 99.7 %   Patient temperature 36.3    Collection site RIGHT BRACHIAL     Comment: Performed at [facility], [address] [place], [place] [address]  Glucose, capillary     Status: Abnormal   Collection Time: 11/20/24  8:44 PM  Result Value Ref Range   Glucose-Capillary 131 (H) 70 - 99 mg/dL    Comment: Glucose reference range applies only to samples taken after fasting for at least 8 hours.  Blood gas, arterial     Status: Abnormal   Collection Time: 11/20/24  9:32 PM  Result Value Ref Range  O2 Content 100.0 L/min   Delivery systems VENTILATOR    Mode PRESSURE REGULATED VOLUME CONTROL    MECHVT 400 mL   RATE 18 resp/min   PEEP 5 cm H20   pH, Arterial 7.21 (L) 7.35 - 7.45   pCO2 arterial 71 (HH) 32 - 48 mmHg    Comment: CRITICAL RESULT CALLED TO, READ BACK BY AND VERIFIED WITH: ELLIE HERO RN @ 2144 ON 11/20/2024 BY MTA    pO2, Arterial 83 83 - 108 mmHg   Bicarbonate 28.5 (H) 20.0 - 28.0 mmol/L   Acid-base deficit 0.5 0.0 - 2.0 mmol/L   O2 Saturation 98.6 %   Patient temperature 36.3    Collection site RIGHT BRACHIAL    Drawn by [id number]    Allens test (pass/fail) PASS PASS    Comment: Performed at [facility], [address] [place], [place] [address]  MRSA Next Gen by PCR, Nasal     Status: None   Collection Time: 11/20/24  9:36 PM   Specimen: Nasal Mucosa; Nasal Swab  Result Value Ref Range   MRSA by PCR Next Gen NOT DETECTED NOT DETECTED    Comment: (NOTE) The GeneXpert MRSA Assay (FDA approved for NASAL specimens only), is one component of a comprehensive MRSA  colonization surveillance program. It is not intended to diagnose MRSA infection nor to guide or monitor treatment for MRSA infections. Test performance is not FDA approved in patients less than 26 years old. Performed at [facility], [address], [place], [place] [address]   Hemoglobin and hematocrit, blood     Status: Abnormal   Collection Time: 11/20/24 10:07 PM  Result Value Ref Range   Hemoglobin 8.9 (L) 12.0 - 15.0 g/dL   HCT 70.5 (L) 63.9 - 53.9 %    Comment: Performed at [facility], [address] [place], [place] [address]  Glucose, capillary     Status: Abnormal   Collection Time: 11/20/24 11:34 PM  Result Value Ref Range   Glucose-Capillary 118 (H) 70 - 99 mg/dL    Comment: Glucose reference range applies only to samples taken after fasting for at least 8 hours.  Magnesium      Status: Abnormal   Collection Time: 11/21/24  3:19 AM  Result Value Ref Range   Magnesium  1.6 (L) 1.7 - 2.4 mg/dL    Comment: Performed at [facility], [address] [place], [place] [address]  CBC     Status: Abnormal   Collection Time: 11/21/24  3:19 AM  Result Value Ref Range   WBC 15.7 (H) 4.0 - 10.5 K/uL   RBC 2.83 (L) 3.87 - 5.11 MIL/uL   Hemoglobin 8.1 (L) 12.0 - 15.0 g/dL   HCT 74.0 (L) 63.9 - 53.9 %   MCV 91.5 80.0 - 100.0 fL   MCH 28.6 26.0 - 34.0 pg   MCHC 31.3 30.0 - 36.0 g/dL   RDW 85.9 88.4 - 84.4 %   Platelets 352 150 - 400 K/uL   nRBC 0.0 0.0 - 0.2 %    Comment: Performed at [facility], [address], [place], [place] [address]  Potassium     Status: None   Collection Time: 11/21/24  3:19 AM  Result Value Ref Range   Potassium 4.7 3.5 - 5.1 mmol/L    Comment: Performed at [facility], [address], [place], [place] [address]  Creatinine, serum     Status: None  Collection Time: 11/21/24  3:19 AM  Result Value Ref Range   Creatinine, Ser 0.48 0.44 - 1.00 mg/dL    GFR, Estimated >39 >39 mL/min    Comment: (NOTE) Calculated using the CKD-EPI Creatinine Equation (2021) Performed at [facility], [address] [place], [place] [address]   Basic metabolic panel     Status: Abnormal   Collection Time: 11/21/24  3:19 AM  Result Value Ref Range   Sodium 138 135 - 145 mmol/L   Potassium 4.8 3.5 - 5.1 mmol/L   Chloride 102 98 - 111 mmol/L   CO2 27 22 - 32 mmol/L   Glucose, Bld 127 (H) 70 - 99 mg/dL    Comment: Glucose reference range applies only to samples taken after fasting for at least 8 hours.   BUN 8 8 - 23 mg/dL   Creatinine, Ser 9.51 0.44 - 1.00 mg/dL   Calcium  8.0 (L) 8.9 - 10.3 mg/dL   GFR, Estimated >39 >39 mL/min    Comment: (NOTE) Calculated using the CKD-EPI Creatinine Equation (2021)    Anion gap 9 5 - 15    Comment: Performed at [facility], [address], [place], [place] [address]  Glucose, capillary     Status: Abnormal   Collection Time: 11/21/24  4:37 AM  Result Value Ref Range   Glucose-Capillary 132 (H) 70 - 99 mg/dL    Comment: Glucose reference range applies only to samples taken after fasting for at least 8 hours.  Glucose, capillary     Status: Abnormal   Collection Time: 11/21/24  7:53 AM  Result Value Ref Range   Glucose-Capillary 114 (H) 70 - 99 mg/dL    Comment: Glucose reference range applies only to samples taken after fasting for at least 8 hours.  Glucose, capillary     Status: Abnormal   Collection Time: 11/21/24 11:41 AM  Result Value Ref Range   Glucose-Capillary 113 (H) 70 - 99 mg/dL    Comment: Glucose reference range applies only to samples taken after fasting for at least 8 hours.  Glucose, capillary     Status: None   Collection Time: 11/21/24  3:30 PM  Result Value Ref Range   Glucose-Capillary 89 70 - 99 mg/dL    Comment: Glucose reference range applies only to samples taken after fasting for at least 8 hours.  Glucose, capillary     Status: Abnormal    Collection Time: 11/21/24  7:39 PM  Result Value Ref Range   Glucose-Capillary 110 (H) 70 - 99 mg/dL    Comment: Glucose reference range applies only to samples taken after fasting for at least 8 hours.  Glucose, capillary     Status: None   Collection Time: 11/22/24 12:03 AM  Result Value Ref Range   Glucose-Capillary 89 70 - 99 mg/dL    Comment: Glucose reference range applies only to samples taken after fasting for at least 8 hours.  Glucose, capillary     Status: None   Collection Time: 11/22/24  3:23 AM  Result Value Ref Range   Glucose-Capillary 95 70 - 99 mg/dL    Comment: Glucose reference range applies only to samples taken after fasting for at least 8 hours.    Imaging / Studies: DG CHEST PORT 1 VIEW Result Date: 11/20/2024 EXAM: 1 VIEW(S) XRAY OF THE CHEST 11/20/2024 10:22:00 PM COMPARISON: 04/16/2024 CLINICAL HISTORY: Visit for intubation. FINDINGS: LINES, TUBES AND DEVICES: Endotracheal tube projects over the intrathoracic trachea 3.6 cm  from the carina. Enteric tube in place with tip below the diaphragm. LUNGS AND PLEURA: Surgical clips overlying the LEFT upper lobe. Emphysema. Biapical scarring. No pleural effusion. No pneumothorax. HEART AND MEDIASTINUM: No acute abnormality of the cardiac and mediastinal silhouettes. BONES AND SOFT TISSUES: Soft tissue emphysema along the RIGHT lateral chest wall. Thoracic spondylosis. IMPRESSION: 1. Endotracheal tube tip 3.6 cm above the carina. 2. Soft tissue emphysema along the right lateral chest wall. Electronically signed by: Norman Gatlin MD 11/20/2024 10:26 PM EST RP Workstation: [id number]   DG Abd 1 View Result Date: 11/20/2024 EXAM: 1 VIEW XRAY OF THE ABDOMEN 11/20/2024 10:20:00 PM COMPARISON: None available. CLINICAL HISTORY: Encounter for imaging study to confirm orogastric (OG) tube placement. ICD10: Z01.89 Encounter for other specified special examinations. FINDINGS: LINES, TUBES AND DEVICES: NG tube in place with distal tip and  side port below the diaphragm overlying the stomach. Tip is oriented medially towards the gastroesophageal junction. Tubing overlying the lower abdomen. BOWEL: Gaseous distension of the transverse colon. Nonobstructive bowel gas pattern. SOFT TISSUES: Cholecystectomy clips noted. BONES: No acute fracture. IMPRESSION: 1. NG tube tip and side port below the diaphragm within the stomach, with the tip oriented medially toward the gastroesophageal junction. Electronically signed by: Norman Gatlin MD 11/20/2024 10:24 PM EST RP Workstation: [id number]    Medications / Allergies: per chart  Antibiotics: Anti-infectives (From admission, onward)    Start     Dose/Rate Route Frequency Ordered Stop   11/20/24 1400  neomycin  (MYCIFRADIN ) tablet 1,000 mg  Status:  Discontinued       Placed in And Linked Group   1,000 mg Oral 3 times per day 11/20/24 0957 11/20/24 1001   11/20/24 1400  metroNIDAZOLE  (FLAGYL ) tablet 1,000 mg  Status:  Discontinued       Placed in And Linked Group   1,000 mg Oral 3 times per day 11/20/24 0957 11/20/24 1001   11/20/24 1000  ertapenem  (INVANZ ) 1 g in sodium chloride  0.9 % 100 mL IVPB        1 g 200 mL/hr over 30 Minutes Intravenous On call to O.R. 11/20/24 0957 11/20/24 2051         Note: Portions of this report may have been transcribed using voice recognition software. Every effort was made to ensure accuracy; however, inadvertent computerized transcription errors may be present.   Any transcriptional errors that result from this process are unintentional.    Lauraine Ronnald Nick PA Student  [facility]  11/22/2024  7:39 AM

## 2024-11-22 NOTE — TOC Initial Note (Signed)
 Transition of Care [facility]) - Initial/Assessment Note    Patient Details  Name: Lynn Kline MRN: [id number] Date of Birth: [birth year]  [facility] CM/SW Contact:    Alfonse JONELLE Rex, RN Phone Number: 11/22/2024, 3:42 PM  Clinical Narrative:    Admitted for scheduled surgery-colectomy and take down of colovesical fistula,  post operatively in OR pt required narcan  for extubation, altered, required reintubation, extubated yesterday. Transfused 1U PRBC's. PT eval, await recommendation. CM will continue to follow for dc needs.        Patient Goals and CMS Choice            Expected Discharge Plan and Services                                              Prior Living Arrangements/Services                       Activities of Daily Living   ADL Screening (condition at time of admission) Independently performs ADLs?: Yes (appropriate for developmental age) Is the patient deaf or have difficulty hearing?: No Does the patient have difficulty seeing, even when wearing glasses/contacts?: No Does the patient have difficulty concentrating, remembering, or making decisions?: No  Permission Sought/Granted                  Emotional Assessment              Admission diagnosis:  Colovesical fistula [N32.1] Patient Active Problem List   Diagnosis Date Noted   Acute hypoxic on chronic hypercapnic respiratory failure (HCC) 11/20/2024   Acute on chronic hypoxic respiratory failure (HCC) 11/20/2024   Shock (HCC) 11/20/2024   Diverticulitis of colon 11/18/2024   Stenosis colon (HCC) 11/18/2024   Malnutrition of moderate degree 09/13/2024   COPD, severe (HCC) 09/13/2024   Pre-op evaluation 09/13/2024   Diverticulitis 09/07/2024   Colovesical fistula 09/02/2024   Chronic respiratory failure with hypoxia (HCC) 05/04/2023   Prediabetes 04/13/2023   Senile purpura 04/13/2023   GAD (generalized anxiety disorder) 04/13/2023   Polyp of colon  10/28/2022   Vitamin D  deficiency 12/22/2021   B12 deficiency 12/22/2021   Lung nodule    Coronary atherosclerosis due to calcified coronary lesion 08/13/2020   Atherosclerosis of aorta 08/13/2020   Gallstones 11/06/2014   Hyperlipidemia with target LDL less than 130 08/19/2014   Dysphagia, pharyngoesophageal phase 08/12/2014   IBS (irritable bowel syndrome) 08/12/2014   COPD with asthma (HCC) 11/15/2011   Tobacco abuse 11/15/2011   PCP:  [name], MD Pharmacy:   [facility] [phone] [address], [place] - [address] [facility] [address] [place] [address] Phone: [phone] Fax: [phone]  [facility] - [facility] [address] [place] [address] Phone: [phone] Fax: [phone]     Social Drivers of Health (SDOH) Social History: SDOH Screenings   Food Insecurity: No Food Insecurity (09/18/2024)  Recent Concern: Food Insecurity - Food Insecurity Present (09/02/2024)  Housing: Unknown (09/18/2024)  Transportation Needs: No Transportation Needs (09/18/2024)  Utilities: Not At Risk (09/18/2024)  Alcohol Screen: Low Risk (02/20/2024)  Depression (PHQ2-9): Low Risk (09/20/2024)  Financial Resource Strain: Medium Risk (04/09/2024)  Physical Activity: Insufficiently Active (04/09/2024)  Social Connections: Socially Isolated (09/02/2024)  Stress: Stress Concern  Present (04/09/2024)  Tobacco Use: Medium Risk (11/20/2024)  Health Literacy: Adequate Health Literacy (02/20/2024)   SDOH Interventions:     Readmission Risk Interventions    04/08/2023    3:06 PM  Readmission Risk Prevention Plan  Post Dischage Appt Complete  Medication Screening Complete  Transportation Screening Complete

## 2024-11-22 NOTE — Evaluation (Signed)
 Occupational Therapy Evaluation Patient Details Name: Lynn Kline MRN: [id number] DOB: Feb 17, 1956 Today's Date: 11/22/2024   History of Present Illness   Pt s/p low anterior rectosigmoid resection with anastomosis and colovesical fistula takedown and repair on 11-20-2024, due to complicated diverticulitis.  Past medical history of atherosclerois, anxiety and COPD (3L home O2)     Clinical Impressions The pt is currently presenting below her baseline level of functioning for self-care management. He is limited by the below listed deficits (see OT problem list). During the session today, she required CGA to min assist for tasks, such as sit to stand using a RW, toileting management at bedside commode level and for ambulation into the hall using a RW. Of note, she uses 3L O2 around the clock at her baseline. Today, she was further noted to be with general deconditioning, increased post-op pain, and nausea with progressive activity. She will benefit from further OT services to maximize her independence with ADLs and to facilitate her safe return home. She is hopeful to progress to returning home with family assistance as needed, once discharged from the hospital.      If plan is discharge home, recommend the following:   Assist for transportation;Assistance with cooking/housework;Help with stairs or ramp for entrance;A little help with bathing/dressing/bathroom     Functional Status Assessment   Patient has had a recent decline in their functional status and demonstrates the ability to make significant improvements in function in a reasonable and predictable amount of time.     Equipment Recommendations   None recommended by OT     Recommendations for Other Services         Precautions/Restrictions   Precautions Precautions: Fall Precaution/Restrictions Comments: JP drain on R Restrictions Weight Bearing Restrictions Per Provider Order: No Other Position/Activity  Restrictions: O2 dependent at baseline     Mobility Bed Mobility      General bed mobility comments: Pt up in recliner with nursing and requests back to same    Transfers Overall transfer level: Needs assistance Equipment used: Rolling walker (2 wheels) Transfers: Sit to/from Stand, Bed to chair/wheelchair/BSC Sit to Stand: Min assist     Step pivot transfers: Min assist     General transfer comment: Steady assist      Balance     Sitting balance-Leahy Scale: Good       Standing balance-Leahy Scale: Fair           ADL either performed or assessed with clinical judgement   ADL Overall ADL's : Needs assistance/impaired Eating/Feeding: Independent;Sitting   Grooming: Set up;Sitting;Standing;Contact guard assist Grooming Details (indicate cue type and reason): Set-up seated pt CGA standing Upper Body Bathing: Set up;Supervision/ safety;Sitting   Lower Body Bathing: Minimal assistance;Sitting/lateral leans;Sit to/from stand   Upper Body Dressing : Set up;Supervision/safety;Sitting   Lower Body Dressing: Minimal assistance;Sitting/lateral leans;Sit to/from stand   Toilet Transfer: Contact guard assist;BSC/3in1;Rolling walker (2 wheels);Stand-pivot   Toileting- Clothing Manipulation and Hygiene: Minimal assistance;Sit to/from stand Toileting - Clothing Manipulation Details (indicate cue type and reason): Pt required assist for toileting management at bedside commode level              Pertinent Vitals/Pain Pain Assessment Pain Assessment: 0-10 Pain Score: 7  Pain Location: abdomen with movement Pain Descriptors / Indicators: Aching, Guarding, Grimacing, Sore Pain Intervention(s): Limited activity within patient's tolerance, Monitored during session, Repositioned     Extremity/Trunk Assessment Upper Extremity Assessment Upper Extremity Assessment: Right hand dominant;RUE deficits/detail;LUE deficits/detail RUE Deficits /  Details: AROM WFL. Gross  strength at least 4-/5 based on observation of functional abilities LUE Deficits / Details: AROM WFL. Gross strength at least 4-/5 based on observation of functional abilities   Lower Extremity Assessment Lower Extremity Assessment: Generalized weakness;RLE deficits/detail;LLE deficits/detail RLE Deficits / Details: AROM WFL LLE Deficits / Details: AROM WFL       Communication Communication Communication: No apparent difficulties   Cognition Arousal: Alert Behavior During Therapy: WFL for tasks assessed/performed Cognition: No apparent impairments             OT - Cognition Comments: Oriented x4                 Following commands: Intact       Cueing  General Comments   Cueing Techniques: Verbal cues              Home Living Family/patient expects to be discharged to:: Private residence Living Arrangements: Children Available Help at Discharge: Family Type of Home: House Home Access: Stairs to enter Secretary/administrator of Steps: 2 Entrance Stairs-Rails: Left Home Layout: One level     Bathroom Shower/Tub: Tub/shower unit         Home Equipment: None   Additional Comments: Uses 3L O2 around the clock.      Prior Functioning/Environment Prior Level of Function : Independent/Modified Independent;Driving             Mobility Comments: Independent with household ambulation. Limited community distances due to compromised endurance. ADLs Comments: Modifed independent to independent with ADLs (takes spongebaths). She does limited driving. She prepares simple meals & cleaning has been difficult due to compromised endurance. Works from home.    OT Problem List: Decreased strength;Impaired balance (sitting and/or standing);Decreased activity tolerance;Decreased knowledge of precautions;Cardiopulmonary status limiting activity;Pain   OT Treatment/Interventions: Self-care/ADL training;Therapeutic exercise;Energy conservation;DME and/or AE  instruction;Therapeutic activities;Balance training;Patient/family education      OT Goals(Current goals can be found in the care plan section)   Acute Rehab OT Goals OT Goal Formulation: With patient Time For Goal Achievement: 12/06/24 Potential to Achieve Goals: Good ADL Goals Pt Will Perform Grooming: with modified independence;standing Pt Will Perform Upper Body Dressing: with modified independence;sitting Pt Will Perform Lower Body Dressing: with modified independence;sit to/from stand;sitting/lateral leans Pt Will Transfer to Toilet: ambulating;with modified independence;grab bars Pt Will Perform Toileting - Clothing Manipulation and hygiene: with modified independence;sit to/from stand   OT Frequency:  Min 2X/week    Co-evaluation PT/OT/SLP Co-Evaluation/Treatment: Yes Reason for Co-Treatment: For patient/therapist safety;To address functional/ADL transfers PT goals addressed during session: Mobility/safety with mobility OT goals addressed during session: ADL's and self-care      AM-PAC OT 6 Clicks Daily Activity     Outcome Measure Help from another person eating meals?: None Help from another person taking care of personal grooming?: A Little Help from another person toileting, which includes using toliet, bedpan, or urinal?: A Little Help from another person bathing (including washing, rinsing, drying)?: A Little Help from another person to put on and taking off regular upper body clothing?: A Little Help from another person to put on and taking off regular lower body clothing?: A Little 6 Click Score: 19   End of Session Equipment Utilized During Treatment: Gait belt;Rolling walker (2 wheels);Oxygen  Nurse Communication: Mobility status  Activity Tolerance: Patient limited by pain Patient left: in chair;with call bell/phone within reach;with chair alarm set;with family/visitor present  OT Visit Diagnosis: Pain;Muscle weakness (generalized) (M62.81) Pain - part  of body:  (  abdomen)                Time: 8854-8780 OT Time Calculation (min): 34 min Charges:  OT General Charges $OT Visit: 1 Visit OT Evaluation $OT Eval Moderate Complexity: 1 Mod    Adriaan Maltese J Harris, OTR/L 11/22/2024, 3:15 PM

## 2024-11-22 NOTE — Plan of Care (Signed)
 " Problem: Education: Goal: Understanding of discharge needs will improve Outcome: Progressing Goal: Verbalization of understanding of the causes of altered bowel function will improve Outcome: Progressing   Problem: Activity: Goal: Ability to tolerate increased activity will improve Outcome: Progressing   Problem: Bowel/Gastric: Goal: Gastrointestinal status for postoperative course will improve Outcome: Progressing   Problem: Health Behavior/Discharge Planning: Goal: Identification of community resources to assist with postoperative recovery needs will improve Outcome: Progressing   Problem: Nutritional: Goal: Will attain and maintain optimal nutritional status will improve Outcome: Progressing   Problem: Clinical Measurements: Goal: Postoperative complications will be avoided or minimized Outcome: Progressing   Problem: Respiratory: Goal: Respiratory status will improve Outcome: Progressing   Problem: Skin Integrity: Goal: Will show signs of wound healing Outcome: Progressing   Problem: Education: Goal: Knowledge of the prescribed therapeutic regimen will improve Outcome: Progressing   Problem: Bowel/Gastric: Goal: Gastrointestinal status for postoperative course will improve Outcome: Progressing   Problem: Cardiac: Goal: Ability to maintain an adequate cardiac output Outcome: Progressing Goal: Will show no evidence of cardiac arrhythmias Outcome: Progressing   Problem: Nutritional: Goal: Will attain and maintain optimal nutritional status Outcome: Progressing   Problem: Neurological: Goal: Will regain or maintain usual level of consciousness Outcome: Progressing   Problem: Clinical Measurements: Goal: Ability to maintain clinical measurements within normal limits Outcome: Progressing Goal: Postoperative complications will be avoided or minimized Outcome: Progressing   Problem: Respiratory: Goal: Will regain and/or maintain adequate  ventilation Outcome: Progressing Goal: Respiratory status will improve Outcome: Progressing   Problem: Skin Integrity: Goal: Demonstrates signs of wound healing without infection Outcome: Progressing   Problem: Urinary Elimination: Goal: Will remain free from infection Outcome: Progressing Goal: Ability to achieve and maintain adequate urine output Outcome: Progressing   Problem: Education: Goal: Knowledge of General Education information will improve Description: Including pain rating scale, medication(s)/side effects and non-pharmacologic comfort measures Outcome: Progressing   Problem: Health Behavior/Discharge Planning: Goal: Ability to manage health-related needs will improve Outcome: Progressing   Problem: Clinical Measurements: Goal: Ability to maintain clinical measurements within normal limits will improve Outcome: Progressing Goal: Will remain free from infection Outcome: Progressing Goal: Diagnostic test results will improve Outcome: Progressing Goal: Respiratory complications will improve Outcome: Progressing Goal: Cardiovascular complication will be avoided Outcome: Progressing   Problem: Activity: Goal: Risk for activity intolerance will decrease Outcome: Progressing   Problem: Nutrition: Goal: Adequate nutrition will be maintained Outcome: Progressing   Problem: Coping: Goal: Level of anxiety will decrease Outcome: Progressing   Problem: Elimination: Goal: Will not experience complications related to bowel motility Outcome: Progressing Goal: Will not experience complications related to urinary retention Outcome: Progressing   Problem: Pain Managment: Goal: General experience of comfort will improve and/or be controlled Outcome: Progressing   Problem: Safety: Goal: Ability to remain free from injury will improve Outcome: Progressing   Problem: Skin Integrity: Goal: Risk for impaired skin integrity will decrease Outcome: Progressing    Problem: Education: Goal: Ability to describe self-care measures that may prevent or decrease complications (Diabetes Survival Skills Education) will improve Outcome: Progressing Goal: Individualized Educational Video(s) Outcome: Progressing   Problem: Coping: Goal: Ability to adjust to condition or change in health will improve Outcome: Progressing   Problem: Fluid Volume: Goal: Ability to maintain a balanced intake and output will improve Outcome: Progressing   Problem: Health Behavior/Discharge Planning: Goal: Ability to identify and utilize available resources and services will improve Outcome: Progressing Goal: Ability to manage health-related needs will improve Outcome: Progressing   Problem:  Metabolic: Goal: Ability to maintain appropriate glucose levels will improve Outcome: Progressing   "

## 2024-11-22 NOTE — Evaluation (Signed)
 Physical Therapy Evaluation Patient Details Name: Lynn Kline MRN: [id number] DOB: 1956-03-22 Today's Date: 11/22/2024  History of Present Illness  Pt s/p low anterior rectosigmoid resection with anastomosis and colovesical fistula takedown and repair.  Pt with hx of atherosclerois, anxiety and COPD (3L home O2)  Clinical Impression  Pt admitted as above and presenting with functional mobility limitations 2* generalized weakness/deconditioning, post op pain and mild balance deficits.  This date, pt up to ambulate limited distance in hall with RW - limited by onset of nausea. Pt motivated and hopes to progress to dc home with assist of family.       If plan is discharge home, recommend the following: A little help with walking and/or transfers;A little help with bathing/dressing/bathroom;Assistance with cooking/housework;Assist for transportation;Help with stairs or ramp for entrance   Can travel by private vehicle        Equipment Recommendations None recommended by PT  Recommendations for Other Services       Functional Status Assessment Patient has had a recent decline in their functional status and demonstrates the ability to make significant improvements in function in a reasonable and predictable amount of time.     Precautions / Restrictions Precautions Precautions: Fall Precaution/Restrictions Comments: JP drain on R Restrictions Weight Bearing Restrictions Per Provider Order: No      Mobility  Bed Mobility               General bed mobility comments: Pt up in recliner with nursing and requests back to same    Transfers Overall transfer level: Needs assistance Equipment used: Rolling walker (2 wheels) Transfers: Sit to/from Stand, Bed to chair/wheelchair/BSC Sit to Stand: Min assist   Step pivot transfers: Min assist       General transfer comment: Steady assist with cues for use of UEs to self assist    Ambulation/Gait Ambulation/Gait assistance:  Min assist Gait Distance (Feet): 48 Feet Assistive device: Rolling walker (2 wheels) Gait Pattern/deviations: Step-to pattern, Step-through pattern, Decreased step length - right, Decreased step length - left, Shuffle, Trunk flexed Gait velocity: decr     General Gait Details: Increased time with steady assist and cues for posture and position from RW; distance ltd by nausea  Stairs            Wheelchair Mobility     Tilt Bed    Modified Rankin (Stroke Patients Only)       Balance Overall balance assessment: Needs assistance Sitting-balance support: No upper extremity supported, Feet supported Sitting balance-Leahy Scale: Good     Standing balance support: No upper extremity supported Standing balance-Leahy Scale: Fair                               Pertinent Vitals/Pain Pain Assessment Pain Assessment: Faces Faces Pain Scale: Hurts even more Pain Location: abdomen with movement Pain Descriptors / Indicators: Aching, Guarding, Grimacing, Sore Pain Intervention(s): Limited activity within patient's tolerance, Monitored during session, Premedicated before session    Home Living Family/patient expects to be discharged to:: Private residence Living Arrangements: Children Available Help at Discharge: Family Type of Home: House Home Access: Stairs to enter Entrance Stairs-Rails: Left Entrance Stairs-Number of Steps: 2   Home Layout: One level Home Equipment: None Additional Comments: Uses 3L O2 around the clock.    Prior Function Prior Level of Function : Independent/Modified Independent;Driving             Mobility  Comments: Independent with household ambulation. Limited community distances due to compromised endurance. ADLs Comments: Modifed independent to independent with ADLs (takes spongebaths). She does limited driving. She prepares simple meals & cleaning has been difficult due to compromised endurance. Works from home.      Extremity/Trunk Assessment   Upper Extremity Assessment Upper Extremity Assessment: Generalized weakness    Lower Extremity Assessment Lower Extremity Assessment: Generalized weakness       Communication   Communication Communication: No apparent difficulties    Cognition Arousal: Alert Behavior During Therapy: WFL for tasks assessed/performed   PT - Cognitive impairments: No apparent impairments                         Following commands: Intact       Cueing Cueing Techniques: Verbal cues, Gestural cues     General Comments      Exercises     Assessment/Plan    PT Assessment Patient needs continued PT services  PT Problem List Decreased strength;Decreased range of motion;Decreased activity tolerance;Decreased balance;Decreased mobility;Decreased knowledge of use of DME;Pain       PT Treatment Interventions Gait training;DME instruction;Stair training;Therapeutic activities;Functional mobility training;Therapeutic exercise;Patient/family education    PT Goals (Current goals can be found in the Care Plan section)  Acute Rehab PT Goals Patient Stated Goal: regain IND and return home PT Goal Formulation: With patient Time For Goal Achievement: 11/29/24 Potential to Achieve Goals: Good    Frequency Min 3X/week     Co-evaluation PT/OT/SLP Co-Evaluation/Treatment: Yes Reason for Co-Treatment: For patient/therapist safety;To address functional/ADL transfers PT goals addressed during session: Mobility/safety with mobility OT goals addressed during session: ADL's and self-care       AM-PAC PT 6 Clicks Mobility  Outcome Measure Help needed turning from your back to your side while in a flat bed without using bedrails?: A Lot Help needed moving from lying on your back to sitting on the side of a flat bed without using bedrails?: A Lot Help needed moving to and from a bed to a chair (including a wheelchair)?: A Little Help needed standing up  from a chair using your arms (e.g., wheelchair or bedside chair)?: A Little Help needed to walk in hospital room?: A Little Help needed climbing 3-5 steps with a railing? : A Lot 6 Click Score: 15    End of Session Equipment Utilized During Treatment: Gait belt Activity Tolerance: Patient tolerated treatment well;Other (comment) (ltd by nausea) Patient left: in chair;with call bell/phone within reach;with family/visitor present Nurse Communication: Mobility status PT Visit Diagnosis: Difficulty in walking, not elsewhere classified (R26.2)    Time: 8848-8777 PT Time Calculation (min) (ACUTE ONLY): 31 min   Charges:   PT Evaluation $PT Eval Low Complexity: 1 Low   PT General Charges $$ ACUTE PT VISIT: 1 Visit         [facility] PT Acute Rehabilitation Services Office [phone]   Conall Vangorder 11/22/2024, 12:59 PM

## 2024-11-22 NOTE — Progress Notes (Addendum)
 "  NAME:  Lynn Kline, MRN:  [id number], DOB:  Jun 09, 1956, LOS: 2 ADMISSION DATE:  11/20/2024, CONSULTATION DATE:  11/20/24 REFERRING MD:  anesthesia CHIEF COMPLAINT:  acute resp failure   History of Present Illness:  69 yo presented for colectomy and take down of colovesical fistula. Completed procedure but post operatively in OR pt required narcan  for extubation. She remained somnolent but arousable. Pt was transferred to PACU and remained altered, Abg revealed co2 of >80 and pt was reintubated. Baseline pt has COPD on 3L [place] at home. She was at high risk for prolonged intubation pre-operatively and pulmonary aware prior to surgery. With her reintubation CCM was asked to transfer to ICU and manage vent. BP remained stable with exception of at induction when she required neo push. Currently, no infusion required.   Pt was intubated prior to my assessment so all history obtained from chart and no ROS was able to be obtained.   Pertinent  Medical History   Chronic hypoxic resp failure 3L baseline copd Diverticulosis with colovesical fistula Prediabetes GAD Hyperlipidemia IBS   Significant Hospital Events: Including procedures, antibiotic start and stop dates in addition to other pertinent events   2/4 Admitted to hospital for surgery 2/4,  post operatively reintubated 2/4 Transferred to ICU 2/4 and ccm consulted 2/5 Remained intubated overnight, currently following commands, on limited sedation  2/5 Extubated, no significant events overnight- needing low dose levophed , hgb 6.6 this morning, suspect hypotension in setting of expected post-op blood loss  Interim History / Subjective:  Following commands   No significant events overnight, besides needing low dose levophed - hgb 6.7 this morning   Did not utilize BIPAP overnight   Objective    Blood pressure 125/64, pulse 84, temperature 98.8 F (37.1 C), temperature source Oral, resp. rate 14, height 5' 2 (1.575 m), weight 56.8 kg, SpO2  97%.    Vent Mode: BIPAP FiO2 (%):  [30 %-32 %] 32 % PEEP:  [5 cmH20] 5 cmH20 Pressure Support:  [5 cmH20-8 cmH20] 5 cmH20   Intake/Output Summary (Last 24 hours) at 11/22/2024 0901 Last data filed at 11/22/2024 0702 Gross per 24 hour  Intake 1428.41 ml  Output 2318 ml  Net -889.59 ml   Filed Weights   11/20/24 1011 11/21/24 0415 11/22/24 0500  Weight: 50.8 kg 57 kg 56.8 kg    Examination: General: acute on chronic older adult female, sitting up in Bed, NAD HEENT: Normocephalic, PERRLA intact, Poor dentition, missing teeth  CV: s1,s2, RRR, no MRG, No JVD  pulm: clear, diminished, no distress on 3L [place]- which is baseline  Abs: bs active, soft, JP drain- minimal output serosanguineous, ex lap site x 3 stable, midline lower abdominal incision- clean, dry, intact  Extremities: no edema, no deformity, moves all extremities on command Skin: no rash  Neuro: Rass 0,follows commands  GU: deferred    Resolved problem list   Assessment and Plan  Colovesical fistula s/p colectomy and anastomosis S/p Colovesical fistula take down and repair IBS Diverticulosis P: CCS following, primary, appreciate assistance and recs Continue post-op management  Continue to monitor JP output  Currently off pressors, hgb 6.7 will transfuse 1 unit of PRBCs, may need a total of 2 will trend h/h  Post PRBC admin  Continue post-op pain regimen per CCS  Acute metabolic (hypercarbic)and toxic (opioid) encephalopathy- resolving  P:  Continue to monitor respiratory status, back on 3L [place] base line' Continue use BIPAP prn  Mobilize, Pulm hygiene, IS   Acute  on chronic hypoxic/hypercarbic resp failure (baseline 3L) H/o pulm nodule Copd Tobacco abuse P:  Continue continuous pulse ox On baseline O2, continue to monitor  Pulm hygiene, PT/OT, mobilize, IS as above  Continue Breztri  inhaler, continue duonebs PRN  BIPAP prn   Expected Post-Op Anemia  Hgb 6.6  IDA hx  - received iron  infusion yesterday  per CCS  P:  Discussed with CCS, will transfuse at least 1 unit of PRBC  Hgb goal >7, transfuse less than <8  Monitor of signs of bleeding   Hypomagnesemia   Replaced with 4g of Mag on 2/5  Obtain full BMP to assess renal function and electrolytes  P:  Follow up with labs, morning labs pending  Add on mag and phos   Prediabetes P: Continue CBG x4 Continue SSI On soft diet, can eventually switch to ACHS   GAD P: Continue supportive care    Labs   CBC: Recent Labs  Lab 11/20/24 2207 11/21/24 0319 11/22/24 0743  WBC  --  15.7* 7.1  HGB 8.9* 8.1* 6.6*  HCT 29.4* 25.9* 21.3*  MCV  --  91.5 92.6  PLT  --  352 231    Basic Metabolic Panel: Recent Labs  Lab 11/21/24 0319  NA 138  K 4.8  4.7  CL 102  CO2 27  GLUCOSE 127*  BUN 8  CREATININE 0.48  0.48  CALCIUM  8.0*  MG 1.6*   GFR: Estimated Creatinine Clearance: 52.5 mL/min (by C-G formula based on SCr of 0.48 mg/dL). Recent Labs  Lab 11/21/24 0319 11/22/24 0743  WBC 15.7* 7.1    Liver Function Tests: No results for input(s): AST, ALT, ALKPHOS, BILITOT, PROT, ALBUMIN  in the last 168 hours. No results for input(s): LIPASE, AMYLASE in the last 168 hours. No results for input(s): AMMONIA in the last 168 hours.  ABG    Component Value Date/Time   PHART 7.21 (L) 11/20/2024 2132   PCO2ART 71 (HH) 11/20/2024 2132   PO2ART 83 11/20/2024 2132   HCO3 28.5 (H) 11/20/2024 2132   ACIDBASEDEF 0.5 11/20/2024 2132   O2SAT 98.6 11/20/2024 2132     Coagulation Profile: No results for input(s): INR, PROTIME in the last 168 hours.  Cardiac Enzymes: No results for input(s): CKTOTAL, CKMB, CKMBINDEX, TROPONINI in the last 168 hours.  HbA1C: Hgb A1c MFr Bld  Date/Time Value Ref Range Status  11/08/2024 10:55 AM 5.5 4.8 - 5.6 % Final    Comment:    (NOTE) Diagnosis of Diabetes The following HbA1c ranges recommended by the American Diabetes Association (ADA) may be used as an aid  in the diagnosis of diabetes mellitus.  Hemoglobin             Suggested A1C NGSP%              Diagnosis  <5.7                   Non Diabetic  5.7-6.4                Pre-Diabetic  >6.4                   Diabetic  <7.0                   Glycemic control for                       adults with diabetes.    11/29/2023 03:09 PM 5.8 4.6 -  6.5 % Final    Comment:    Glycemic Control Guidelines for People with Diabetes:Non Diabetic:  <6%Goal of Therapy: <7%Additional Action Suggested:  >8%     CBG: Recent Labs  Lab 11/21/24 1530 11/21/24 1939 11/22/24 0003 11/22/24 0323 11/22/24 0743  GLUCAP 89 110* 89 95 84    Home Medications  Prior to Admission medications  Medication Sig Start Date End Date Taking? Authorizing Provider  albuterol  (PROVENTIL ) (2.5 MG/3ML) 0.083% nebulizer solution Take 3 mLs (2.5 mg total) by nebulization every 4 (four) hours as needed for wheezing or shortness of breath. 09/16/24  Yes Sung Parodi Debby CROME, MD  ALPRAZolam  (XANAX ) 0.25 MG tablet Take 1 tablet (0.25 mg total) by mouth 2 (two) times daily as needed for anxiety. 10/24/24  Yes Malgorzata Albert Debby CROME, MD  budesonide -glycopyrrolate -formoterol  (BREZTRI  AEROSPHERE) 160-9-4.8 MCG/ACT AERO inhaler Inhale 2 puffs into the lungs in the morning and at bedtime. 04/12/24  Yes Cobb, Comer GAILS, NP  cholecalciferol  (VITAMIN D3) 25 MCG (1000 UNIT) tablet Take 1,000 Units by mouth daily.   Yes [provider]  nitrofurantoin (MACRODANTIN) 100 MG capsule Take 100 mg by mouth at bedtime. 10/11/24  Yes [provider]  OXYGEN  Inhale 3 L/min into the lungs continuous.   Yes [provider]  traMADol  (ULTRAM ) 50 MG tablet Take 1-2 tablets (50-100 mg total) by mouth every 6 (six) hours as needed for moderate pain (pain score 4-6) or severe pain (pain score 7-10). 11/20/24  Yes [name], MD  polycarbophil (FIBERCON) 625 MG tablet Take 1 tablet (625 mg total) by mouth 2 (two) times daily. Patient not taking:  Reported on 11/01/2024 09/16/24   Jillian Buttery, MD    Critical care time: The patient is critically ill requires high complexity decision making for assessment and support, frequent evaluation and titration of therapies, application of advanced monitoring technologies and extensive interpretation of multiple databases.   Critical care time 40 mins. This represents my time independent of the NPs time taking care of the pt. This is excluding procedures.    Total CC: 45 mins  Christian Celestial Barnfield AGACNP-BC   [facility] 11/22/2024, 9:01 AM  Please see [email or web address] for pager details.  From 7A-7P if no response, please call [phone]. After hours, please call ELink [phone].         "

## 2025-02-21 ENCOUNTER — Ambulatory Visit

## 2025-03-24 ENCOUNTER — Ambulatory Visit

## 2025-03-24 ENCOUNTER — Encounter
